# Patient Record
Sex: Female | Born: 1935 | Race: Black or African American | Hispanic: No | Smoking: Former smoker
Health system: Southern US, Community
[De-identification: ages and names within clinical notes are randomized; demographics above are authoritative.]

## PROBLEM LIST (undated history)

## (undated) DIAGNOSIS — L0390 Cellulitis, unspecified: Secondary | ICD-10-CM

## (undated) DIAGNOSIS — I2510 Atherosclerotic heart disease of native coronary artery without angina pectoris: Secondary | ICD-10-CM

## (undated) DIAGNOSIS — IMO0001 Reserved for inherently not codable concepts without codable children: Secondary | ICD-10-CM

## (undated) DIAGNOSIS — N2581 Secondary hyperparathyroidism of renal origin: Secondary | ICD-10-CM

## (undated) DIAGNOSIS — I4891 Unspecified atrial fibrillation: Secondary | ICD-10-CM

## (undated) DIAGNOSIS — N186 End stage renal disease: Secondary | ICD-10-CM

## (undated) DIAGNOSIS — M79609 Pain in unspecified limb: Secondary | ICD-10-CM

## (undated) DIAGNOSIS — I10 Essential (primary) hypertension: Secondary | ICD-10-CM

## (undated) DIAGNOSIS — I330 Acute and subacute infective endocarditis: Secondary | ICD-10-CM

## (undated) DIAGNOSIS — F0390 Unspecified dementia without behavioral disturbance: Secondary | ICD-10-CM

## (undated) DIAGNOSIS — M24529 Contracture, unspecified elbow: Secondary | ICD-10-CM

## (undated) DIAGNOSIS — S32409A Unspecified fracture of unspecified acetabulum, initial encounter for closed fracture: Secondary | ICD-10-CM

## (undated) DIAGNOSIS — J189 Pneumonia, unspecified organism: Secondary | ICD-10-CM

## (undated) DIAGNOSIS — F329 Major depressive disorder, single episode, unspecified: Secondary | ICD-10-CM

## (undated) DIAGNOSIS — R609 Edema, unspecified: Secondary | ICD-10-CM

## (undated) DIAGNOSIS — D649 Anemia, unspecified: Secondary | ICD-10-CM

## (undated) DIAGNOSIS — Z992 Dependence on renal dialysis: Secondary | ICD-10-CM

## (undated) DIAGNOSIS — L89309 Pressure ulcer of unspecified buttock, unspecified stage: Secondary | ICD-10-CM

## (undated) DIAGNOSIS — K5900 Constipation, unspecified: Secondary | ICD-10-CM

## (undated) DIAGNOSIS — K219 Gastro-esophageal reflux disease without esophagitis: Secondary | ICD-10-CM

## (undated) DIAGNOSIS — Z5189 Encounter for other specified aftercare: Secondary | ICD-10-CM

## (undated) DIAGNOSIS — Z7901 Long term (current) use of anticoagulants: Secondary | ICD-10-CM

## (undated) DIAGNOSIS — I6990 Unspecified sequelae of unspecified cerebrovascular disease: Secondary | ICD-10-CM

## (undated) DIAGNOSIS — E785 Hyperlipidemia, unspecified: Secondary | ICD-10-CM

## (undated) DIAGNOSIS — I639 Cerebral infarction, unspecified: Secondary | ICD-10-CM

## (undated) DIAGNOSIS — R7881 Bacteremia: Secondary | ICD-10-CM

## (undated) HISTORY — DX: Unspecified atrial fibrillation: I48.91

## (undated) HISTORY — PX: TUBAL LIGATION: SHX77

## (undated) HISTORY — PX: APPENDECTOMY: SHX54

## (undated) HISTORY — DX: Unspecified dementia, unspecified severity, without behavioral disturbance, psychotic disturbance, mood disturbance, and anxiety: F03.90

## (undated) HISTORY — DX: Secondary hyperparathyroidism of renal origin: N25.81

## (undated) HISTORY — DX: Pain in unspecified limb: M79.609

## (undated) HISTORY — DX: Unspecified sequelae of unspecified cerebrovascular disease: I69.90

## (undated) HISTORY — DX: Long term (current) use of anticoagulants: Z79.01

## (undated) HISTORY — DX: Edema, unspecified: R60.9

## (undated) HISTORY — DX: Anemia, unspecified: D64.9

## (undated) HISTORY — DX: Gastro-esophageal reflux disease without esophagitis: K21.9

## (undated) HISTORY — DX: Essential (primary) hypertension: I10

## (undated) HISTORY — DX: Acute and subacute infective endocarditis: I33.0

## (undated) HISTORY — DX: Cerebral infarction, unspecified: I63.9

## (undated) HISTORY — PX: ABDOMINAL HYSTERECTOMY: SHX81

## (undated) HISTORY — DX: Unspecified fracture of unspecified acetabulum, initial encounter for closed fracture: S32.409A

## (undated) HISTORY — PX: CATARACT EXTRACTION W/ INTRAOCULAR LENS  IMPLANT, BILATERAL: SHX1307

## (undated) HISTORY — PX: CHOLECYSTECTOMY: SHX55

## (undated) HISTORY — DX: Cellulitis, unspecified: L03.90

## (undated) HISTORY — DX: Bacteremia: R78.81

## (undated) HISTORY — DX: Constipation, unspecified: K59.00

## (undated) HISTORY — DX: Atherosclerotic heart disease of native coronary artery without angina pectoris: I25.10

## (undated) HISTORY — PX: ARTERIOVENOUS GRAFT PLACEMENT: SUR1029

---

## 2005-04-14 ENCOUNTER — Encounter: Payer: Self-pay | Admitting: Internal Medicine

## 2005-04-15 ENCOUNTER — Encounter: Payer: Self-pay | Admitting: Internal Medicine

## 2008-12-17 ENCOUNTER — Encounter: Payer: Self-pay | Admitting: Internal Medicine

## 2008-12-19 DIAGNOSIS — I10 Essential (primary) hypertension: Secondary | ICD-10-CM

## 2008-12-19 HISTORY — DX: Essential (primary) hypertension: I10

## 2008-12-20 ENCOUNTER — Encounter: Payer: Self-pay | Admitting: Internal Medicine

## 2009-01-13 ENCOUNTER — Encounter: Payer: Self-pay | Admitting: Internal Medicine

## 2009-01-14 DIAGNOSIS — I429 Cardiomyopathy, unspecified: Secondary | ICD-10-CM | POA: Insufficient documentation

## 2009-01-14 DIAGNOSIS — I10 Essential (primary) hypertension: Secondary | ICD-10-CM | POA: Insufficient documentation

## 2009-01-14 DIAGNOSIS — I4891 Unspecified atrial fibrillation: Secondary | ICD-10-CM | POA: Insufficient documentation

## 2009-01-17 ENCOUNTER — Ambulatory Visit: Payer: Self-pay | Admitting: Internal Medicine

## 2009-01-17 DIAGNOSIS — E119 Type 2 diabetes mellitus without complications: Secondary | ICD-10-CM

## 2009-01-17 DIAGNOSIS — E211 Secondary hyperparathyroidism, not elsewhere classified: Secondary | ICD-10-CM

## 2009-01-17 DIAGNOSIS — N186 End stage renal disease: Secondary | ICD-10-CM

## 2009-01-17 DIAGNOSIS — I6340 Cerebral infarction due to embolism of unspecified cerebral artery: Secondary | ICD-10-CM | POA: Insufficient documentation

## 2009-01-17 DIAGNOSIS — D638 Anemia in other chronic diseases classified elsewhere: Secondary | ICD-10-CM

## 2009-01-17 DIAGNOSIS — F068 Other specified mental disorders due to known physiological condition: Secondary | ICD-10-CM

## 2009-01-31 ENCOUNTER — Ambulatory Visit (HOSPITAL_COMMUNITY): Admission: RE | Admit: 2009-01-31 | Discharge: 2009-01-31 | Payer: Self-pay | Admitting: Internal Medicine

## 2009-01-31 ENCOUNTER — Ambulatory Visit: Payer: Self-pay

## 2009-01-31 ENCOUNTER — Encounter: Payer: Self-pay | Admitting: Internal Medicine

## 2009-01-31 ENCOUNTER — Ambulatory Visit: Payer: Self-pay | Admitting: Cardiovascular Disease

## 2009-02-07 DIAGNOSIS — Z7901 Long term (current) use of anticoagulants: Secondary | ICD-10-CM

## 2009-02-07 HISTORY — DX: Long term (current) use of anticoagulants: Z79.01

## 2009-02-28 ENCOUNTER — Ambulatory Visit (HOSPITAL_COMMUNITY): Admission: RE | Admit: 2009-02-28 | Discharge: 2009-02-28 | Payer: Self-pay | Admitting: Nephrology

## 2009-04-15 ENCOUNTER — Encounter: Payer: Self-pay | Admitting: Internal Medicine

## 2009-04-18 ENCOUNTER — Ambulatory Visit: Payer: Self-pay | Admitting: Internal Medicine

## 2009-04-27 DIAGNOSIS — I4891 Unspecified atrial fibrillation: Secondary | ICD-10-CM

## 2009-04-27 HISTORY — DX: Unspecified atrial fibrillation: I48.91

## 2009-05-02 ENCOUNTER — Ambulatory Visit (HOSPITAL_COMMUNITY): Admission: RE | Admit: 2009-05-02 | Discharge: 2009-05-02 | Payer: Self-pay | Admitting: Nephrology

## 2009-05-11 DIAGNOSIS — I6990 Unspecified sequelae of unspecified cerebrovascular disease: Secondary | ICD-10-CM

## 2009-05-11 HISTORY — DX: Unspecified sequelae of unspecified cerebrovascular disease: I69.90

## 2009-06-13 DIAGNOSIS — E785 Hyperlipidemia, unspecified: Secondary | ICD-10-CM

## 2009-06-13 HISTORY — DX: Hyperlipidemia, unspecified: E78.5

## 2009-07-02 ENCOUNTER — Ambulatory Visit: Payer: Self-pay | Admitting: Cardiology

## 2009-07-02 ENCOUNTER — Inpatient Hospital Stay (HOSPITAL_COMMUNITY): Admission: EM | Admit: 2009-07-02 | Discharge: 2009-07-18 | Payer: Self-pay | Admitting: Emergency Medicine

## 2009-07-11 ENCOUNTER — Encounter (INDEPENDENT_AMBULATORY_CARE_PROVIDER_SITE_OTHER): Payer: Self-pay | Admitting: Internal Medicine

## 2009-07-12 ENCOUNTER — Encounter (INDEPENDENT_AMBULATORY_CARE_PROVIDER_SITE_OTHER): Payer: Self-pay | Admitting: Internal Medicine

## 2009-07-14 ENCOUNTER — Ambulatory Visit: Payer: Self-pay | Admitting: Internal Medicine

## 2009-07-21 ENCOUNTER — Observation Stay (HOSPITAL_COMMUNITY): Admission: EM | Admit: 2009-07-21 | Discharge: 2009-07-23 | Payer: Self-pay | Admitting: Emergency Medicine

## 2009-08-15 DIAGNOSIS — F32A Depression, unspecified: Secondary | ICD-10-CM

## 2009-08-15 HISTORY — DX: Depression, unspecified: F32.A

## 2009-08-31 ENCOUNTER — Ambulatory Visit (HOSPITAL_COMMUNITY): Admission: RE | Admit: 2009-08-31 | Discharge: 2009-08-31 | Payer: Self-pay | Admitting: Nephrology

## 2009-09-07 ENCOUNTER — Ambulatory Visit: Payer: Self-pay | Admitting: Vascular Surgery

## 2009-09-07 ENCOUNTER — Emergency Department (HOSPITAL_COMMUNITY): Admission: EM | Admit: 2009-09-07 | Discharge: 2009-09-07 | Payer: Self-pay | Admitting: Emergency Medicine

## 2009-09-07 ENCOUNTER — Encounter (INDEPENDENT_AMBULATORY_CARE_PROVIDER_SITE_OTHER): Payer: Self-pay | Admitting: Emergency Medicine

## 2009-09-08 ENCOUNTER — Ambulatory Visit (HOSPITAL_COMMUNITY): Admission: RE | Admit: 2009-09-08 | Discharge: 2009-09-08 | Payer: Self-pay | Admitting: Nephrology

## 2009-10-03 ENCOUNTER — Inpatient Hospital Stay (HOSPITAL_COMMUNITY): Admission: EM | Admit: 2009-10-03 | Discharge: 2009-10-06 | Payer: Self-pay | Admitting: Emergency Medicine

## 2009-10-03 ENCOUNTER — Ambulatory Visit: Payer: Self-pay | Admitting: Cardiology

## 2009-10-05 ENCOUNTER — Encounter (INDEPENDENT_AMBULATORY_CARE_PROVIDER_SITE_OTHER): Payer: Self-pay | Admitting: Internal Medicine

## 2009-10-12 ENCOUNTER — Ambulatory Visit (HOSPITAL_COMMUNITY): Admission: RE | Admit: 2009-10-12 | Discharge: 2009-10-12 | Payer: Self-pay | Admitting: Internal Medicine

## 2009-10-24 ENCOUNTER — Ambulatory Visit: Payer: Self-pay | Admitting: Internal Medicine

## 2009-11-02 ENCOUNTER — Inpatient Hospital Stay (HOSPITAL_COMMUNITY): Admission: EM | Admit: 2009-11-02 | Discharge: 2009-11-07 | Payer: Self-pay | Admitting: Emergency Medicine

## 2009-11-02 ENCOUNTER — Ambulatory Visit: Payer: Self-pay | Admitting: Internal Medicine

## 2009-11-04 ENCOUNTER — Encounter: Payer: Self-pay | Admitting: Internal Medicine

## 2009-11-14 DIAGNOSIS — R7881 Bacteremia: Secondary | ICD-10-CM

## 2009-11-14 DIAGNOSIS — I330 Acute and subacute infective endocarditis: Secondary | ICD-10-CM

## 2009-11-14 HISTORY — DX: Acute and subacute infective endocarditis: I33.0

## 2009-11-14 HISTORY — DX: Bacteremia: R78.81

## 2009-11-23 ENCOUNTER — Encounter: Payer: Self-pay | Admitting: Internal Medicine

## 2009-11-23 ENCOUNTER — Ambulatory Visit (HOSPITAL_COMMUNITY): Admission: RE | Admit: 2009-11-23 | Discharge: 2009-11-23 | Payer: Self-pay | Admitting: Nephrology

## 2009-12-12 ENCOUNTER — Encounter: Payer: Self-pay | Admitting: Internal Medicine

## 2009-12-26 ENCOUNTER — Encounter: Payer: Self-pay | Admitting: Internal Medicine

## 2009-12-26 ENCOUNTER — Ambulatory Visit (HOSPITAL_COMMUNITY): Admission: RE | Admit: 2009-12-26 | Discharge: 2009-12-26 | Payer: Self-pay | Admitting: Nephrology

## 2009-12-27 ENCOUNTER — Encounter: Payer: Self-pay | Admitting: Internal Medicine

## 2009-12-28 ENCOUNTER — Ambulatory Visit: Payer: Self-pay | Admitting: Cardiology

## 2009-12-28 ENCOUNTER — Encounter (INDEPENDENT_AMBULATORY_CARE_PROVIDER_SITE_OTHER): Payer: Self-pay | Admitting: Nephrology

## 2009-12-28 ENCOUNTER — Encounter: Payer: Self-pay | Admitting: Internal Medicine

## 2009-12-28 ENCOUNTER — Ambulatory Visit (HOSPITAL_COMMUNITY): Admission: RE | Admit: 2009-12-28 | Discharge: 2009-12-28 | Payer: Self-pay | Admitting: Nephrology

## 2009-12-29 ENCOUNTER — Encounter: Payer: Self-pay | Admitting: Internal Medicine

## 2010-01-03 ENCOUNTER — Encounter: Payer: Self-pay | Admitting: Internal Medicine

## 2010-01-03 DIAGNOSIS — R7881 Bacteremia: Secondary | ICD-10-CM

## 2010-01-04 ENCOUNTER — Ambulatory Visit: Payer: Self-pay | Admitting: Vascular Surgery

## 2010-01-04 ENCOUNTER — Encounter (INDEPENDENT_AMBULATORY_CARE_PROVIDER_SITE_OTHER): Payer: Self-pay | Admitting: Nephrology

## 2010-01-04 ENCOUNTER — Ambulatory Visit (HOSPITAL_COMMUNITY): Admission: RE | Admit: 2010-01-04 | Discharge: 2010-01-04 | Payer: Self-pay | Admitting: Nephrology

## 2010-01-09 ENCOUNTER — Ambulatory Visit: Payer: Self-pay | Admitting: Surgery

## 2010-01-09 ENCOUNTER — Encounter: Payer: Self-pay | Admitting: Cardiology

## 2010-01-11 ENCOUNTER — Ambulatory Visit: Payer: Self-pay | Admitting: Cardiology

## 2010-01-11 ENCOUNTER — Ambulatory Visit (HOSPITAL_COMMUNITY): Admission: RE | Admit: 2010-01-11 | Discharge: 2010-01-11 | Payer: Self-pay | Admitting: Cardiology

## 2010-01-11 ENCOUNTER — Encounter: Payer: Self-pay | Admitting: Cardiology

## 2010-01-13 ENCOUNTER — Encounter (INDEPENDENT_AMBULATORY_CARE_PROVIDER_SITE_OTHER): Payer: Self-pay | Admitting: *Deleted

## 2010-01-16 ENCOUNTER — Emergency Department (HOSPITAL_COMMUNITY): Admission: EM | Admit: 2010-01-16 | Discharge: 2010-01-16 | Payer: Self-pay | Admitting: Emergency Medicine

## 2010-01-17 ENCOUNTER — Inpatient Hospital Stay (HOSPITAL_COMMUNITY)
Admission: EM | Admit: 2010-01-17 | Discharge: 2010-01-24 | Payer: Self-pay | Source: Home / Self Care | Admitting: Emergency Medicine

## 2010-01-17 DIAGNOSIS — A4902 Methicillin resistant Staphylococcus aureus infection, unspecified site: Secondary | ICD-10-CM | POA: Insufficient documentation

## 2010-01-18 ENCOUNTER — Encounter (INDEPENDENT_AMBULATORY_CARE_PROVIDER_SITE_OTHER): Payer: Self-pay | Admitting: Emergency Medicine

## 2010-01-22 ENCOUNTER — Encounter: Payer: Self-pay | Admitting: Surgery

## 2010-01-23 ENCOUNTER — Ambulatory Visit: Payer: Self-pay | Admitting: Vascular Surgery

## 2010-01-26 DIAGNOSIS — K219 Gastro-esophageal reflux disease without esophagitis: Secondary | ICD-10-CM

## 2010-01-26 HISTORY — DX: Gastro-esophageal reflux disease without esophagitis: K21.9

## 2010-02-06 ENCOUNTER — Ambulatory Visit: Payer: Self-pay | Admitting: Surgery

## 2010-02-15 ENCOUNTER — Ambulatory Visit: Payer: Self-pay | Admitting: Vascular Surgery

## 2010-03-22 ENCOUNTER — Ambulatory Visit
Admission: RE | Admit: 2010-03-22 | Discharge: 2010-03-22 | Payer: Self-pay | Source: Home / Self Care | Attending: Infectious Diseases | Admitting: Infectious Diseases

## 2010-03-22 ENCOUNTER — Encounter: Payer: Self-pay | Admitting: Infectious Diseases

## 2010-03-31 ENCOUNTER — Encounter: Payer: Self-pay | Admitting: Infectious Diseases

## 2010-04-03 ENCOUNTER — Ambulatory Visit: Admission: RE | Admit: 2010-04-03 | Discharge: 2010-04-03 | Payer: Self-pay | Source: Home / Self Care

## 2010-04-04 NOTE — Assessment & Plan Note (Addendum)
OFFICE VISIT  [name], [name] DOB:  1929-11-26                                       04/03/2010 IRJJO#:[id number]  The patient comes back in today for access discussion.  She has recently undergone removal of a left upper arm graft secondary to infection and has been dialyzing through a right-sided catheter.  She has completed several weeks worth of antibiotics.  Her wounds are healed.  She has not had any active signs of infection.  Her next option is a right forearm graft.  This will be placed on Friday February 3.  She is on Coumadin for atrial fibrillation and I am holding her Coumadin 5 days prior.  She has palpable radial and brachial pulse.    Jorge Ny, MD Electronically Signed  VWB/MEDQ  D:  04/03/2010  T:  04/04/2010  Job:  [id number]  cc:   Dr Darrick Penna

## 2010-04-11 NOTE — Assessment & Plan Note (Signed)
Summary: 6 mo f/u   Visit Type:  Follow-up Referring Provider:  Camille Bal, MD Primary Provider:  Jena Gauss MD   History of Present Illness: The patient presents today for routine cardiology followup. She reports doing very well since last being seen in our clinic, though she was recently hospitalized 5/11 for dehydration/orthostatic syncope and 7/11 for MRSA bacteremia.  She has significant dementia and her history is therefore limited. The patient denies symptoms of palpitations, chest pain, shortness of breath, orthopnea, PND, lower extremity edema, dizziness, presyncope, syncope, or neurologic sequela. The patient is tolerating medications without difficulties and is otherwise without complaint today.   Current Medications (verified): 1)  Warfarin Sodium 6 Mg Tabs (Warfarin Sodium) .... Use As Directed By Anticoagulation Clinic 2)  Lantus 100 Unit/ml Soln (Insulin Glargine) .... As Direected 3)  Metoprolol Tartrate 25 Mg Tabs (Metoprolol Tartrate) .... 1/2 Tab Two Times A Day 4)  Percocet 5-325 Mg Tabs (Oxycodone-Acetaminophen) .... Uad 5)  Eucerin  Lotn (Emollient) .... Uad 6)  Cymbalta 20 Mg Cpep (Duloxetine Hcl) .... Once Daily 7)  Trazodone Hcl 50 Mg Tabs (Trazodone Hcl) .... Uad 8)  Simvastatin 20 Mg Tabs (Simvastatin) .... Take One Tablet By Mouth Daily At Bedtime 9)  Vancomycin Hcl 750 Mg Solr (Vancomycin Hcl) .... Uad 10)  Duoderm Cgf Dressing  Misc (Control Gel Formula Dressing) .... Uad 11)  Colace 100 Mg Caps (Docusate Sodium) .... Uad 12)  Venofer 20 Mg/ml Soln (Iron Sucrose) .... Uad 13)  Zemplar 2 Mcg/ml Soln (Paricalcitol) .... Uad 14)  Zofran 4 Mg Tabs (Ondansetron Hcl) .... As Needed  Allergies (verified): 1)  ! Jonne Ply  Past History:  Past Medical History: Reviewed history from 01/17/2009 and no changes required. 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 Tubal ligation Appendectomy Cholecystectomy TAH and BSO Cataract extraction Dialysis access placement  Social History: Reviewed history from 01/17/2009 and no changes required. Divorced .  Recently relocated to [place] from [place] and now lives in [place].  On dialysis x 2 years. Tobacco Use - No.  Alcohol Use - no  Review of Systems       All systems are reviewed and negative except as listed in the HPI.   Vital Signs:  Patient profile:   77 year old female Height:      66 inches Weight:      151 pounds BMI:     24.46 Pulse rate:   59 / minute BP sitting:   168 / 70  Vitals Entered By: Laurance Flatten CMA (October 24, 2009 3:04 PM)  Physical Exam  General:  elderly female, NAD Head:  normocephalic and atraumatic Eyes:  PERRLA/EOM intact; conjunctiva and lids normal. Mouth:  Teeth, gums and palate normal. Oral mucosa normal. Neck:  supple, no bruits or JVD Lungs:  Clear bilaterally to auscultation and percussion. Heart:  RRR, no m/r/g Abdomen:  Bowel sounds positive; abdomen soft and non-tender without masses, organomegaly, or hernias noted. No hepatosplenomegaly. Msk:  requires wheelchair to ambulate.  Residual L arm/leg weakness Pulses:  pulses normal in all 4 extremities Extremities:  no c/c/e Neurologic:  residual R arm/leg weakness Skin:  Intact without lesions or rashes.  Cervical Nodes:  no significant adenopathy Psych:  Normal affect.   EKG  Procedure date:  10/24/2009  Findings:      sinus bradycardia 59 bpm, PR 214, Qtc 447, LVH, otherwise normal ekg  Impression & Recommendations:  Problem # 1:  ATRIAL FIBRILLATION  (ICD-427.31) stable. presently in sinus continue coumadin, goal INR 2-3 importance of close INR checks through [facility] stressed with patient today  Problem # 2:  HYPERTENSION, UNSPECIFIED (ICD-401.9)  above goal today will defer to nephrology as they know BP range during HD  Her updated medication list for this problem includes:    Metoprolol Tartrate 25 Mg Tabs (Metoprolol tartrate) .Marland Kitchen... 1/2 tab two times a day  Problem # 3:  DEMENTIA, MILD (ICD-294.8) stable  Problem # 4:  CARDIOMYOPATHY, SECONDARY (ICD-425.9) no symptoms of CHF volume managed with HD  Patient Instructions: 1)  Your physician recommends that you schedule a follow-up appointment in: 8 months with Dr Johney Frame

## 2010-04-11 NOTE — Progress Notes (Signed)
Summary: Med List/Problem List   Med List/Problem List   Imported By: Roderic Ovens 01/26/2010 09:19:36  _____________________________________________________________________  External Attachment:    Type:   Image     Comment:   External Document

## 2010-04-11 NOTE — Miscellaneous (Signed)
Summary: Problems, Medications and Allergies updated  Clinical Lists Changes  Problems: Added new problem of BACTEREMIA (ICD-790.7) - persistent MRSA Medications: Added new medication of CUBICIN 500 MG SOLR (DAPTOMYCIN) Administer 250 mg via PICC three times a day Removed medication of VANCOMYCIN HCL 750 MG SOLR (VANCOMYCIN HCL) UAD Added new medication of NEPRO  LIQD (NUTRITIONAL SUPPLEMENTS) One can once daily with a meal Added new medication of NEPHRO-VITE RX 1 MG TABS (B COMPLEX-C-FOLIC ACID) Take 1 tablet by mouth once a day Allergies: Added new allergy or adverse reaction of HEPARIN Added new allergy or adverse reaction of MINOXIDIL (MINOXIDIL) Observations: Added new observation of ALLERGY REV: Done (01/13/2010 11:42)

## 2010-04-11 NOTE — Progress Notes (Signed)
Summary: [facility] Physicians Order Sheet   [facility] Physicians Order Sheet   Imported By: Roderic Ovens 01/26/2010 09:22:10  _____________________________________________________________________  External Attachment:    Type:   Image     Comment:   External Document

## 2010-04-11 NOTE — Progress Notes (Signed)
Summary: [facility] Multidisciplinary Progress Notes  [facility] Multidisciplinary Progress Notes   Imported By: Roderic Ovens 01/26/2010 09:20:16  _____________________________________________________________________  External Attachment:    Type:   Image     Comment:   External Document

## 2010-04-11 NOTE — Miscellaneous (Signed)
  Clinical Lists Changes  Observations: Added new observation of ECHOINTERP:  - Left ventricle: The cavity size was mildly dilated. Wall thickness       was increased in a pattern of mild LVH. Systolic function was       mildly reduced. The estimated ejection fraction was in the range       of 45% to 50%. Diffuse hypokinesis.     - Atrial septum: No defect or patent foramen ovale was identified.     Impressions:            - No cardiac source of emboli was indentified. (01/31/2009 15:26)      Echocardiogram  Procedure date:  01/31/2009  Findings:       - Left ventricle: The cavity size was mildly dilated. Wall thickness       was increased in a pattern of mild LVH. Systolic function was       mildly reduced. The estimated ejection fraction was in the range       of 45% to 50%. Diffuse hypokinesis.     - Atrial septum: No defect or patent foramen ovale was identified.     Impressions:            - No cardiac source of emboli was indentified.

## 2010-04-11 NOTE — Letter (Signed)
Summary: Consultation/Clinic Referral  Consultation/Clinic Referral   Imported By: Marylou Mccoy 12/22/2009 15:13:07  _____________________________________________________________________  External Attachment:    Type:   Image     Comment:   External Document

## 2010-04-11 NOTE — Letter (Signed)
Summary: [facility] Ekg 2007  [facility] Ekg 2007   Imported By: Roderic Ovens 01/26/2010 14:78:29  _____________________________________________________________________  External Attachment:    Type:   Image     Comment:   External Document

## 2010-04-11 NOTE — Assessment & Plan Note (Signed)
Summary: per check out/sf   Visit Type:  Follow-up Referring Provider:  Camille Bal, MD Primary Provider:  Jena Gauss MD   History of Present Illness: The patient presents today for routine cardiology followup. He reports doing very well since last being seen in our clinic. The patient denies symptoms of palpitations, chest pain, shortness of breath, orthopnea, PND, lower extremity edema, dizziness, presyncope, syncope, or neurologic sequela. The patient is tolerating medications without difficulties and is otherwise without complaint today.   Current Medications (verified): 1)  Warfarin Sodium 6 Mg Tabs (Warfarin Sodium) .... Use As Directed By Anticoagulation Clinic 2)  Novolin R 100 Unit/ml Soln (Insulin Regular Human) .... As Directed 3)  Lantus 100 Unit/ml Soln (Insulin Glargine) .... As Direected 4)  Metoprolol Tartrate 25 Mg Tabs (Metoprolol Tartrate) .Marland Kitchen.. 1 Tab Two Times A Day M,w F,su 1 Tab Per Week Tu,th, Sa 5)  Phoslo 667 Mg Caps (Calcium Acetate (Phos Binder)) .... Three Times A Day 6)  Trazodone Hcl 50 Mg Tabs (Trazodone Hcl) .... At Bedtime 7)  Guaifenesin-Dm 100-10 Mg/36ml Syrp (Dextromethorphan-Guaifenesin) .... Uad 8)  Men-Phor 0.5-0.5 % Lotn (Camphor-Menthol) .... Uad  Allergies (verified): 1)  ! Jonne Ply  Past History:  Past Medical History: Reviewed history from 01/17/2009 and no changes required. 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 Tubal ligation Appendectomy Cholecystectomy TAH and BSO Cataract extraction Dialysis access placement  Social  History: Reviewed history from 01/17/2009 and no changes required. Divorced .  Recently relocated to [place] from [place]  and now lives in [place].  On dialysis x 2 years. Tobacco Use - No.  Alcohol Use - no  Review of Systems       All systems are reviewed and negative except as listed in the HPI.   Vital Signs:  Patient profile:   77 year old female Height:      66 inches Weight:      138 pounds BMI:     22.35 Pulse rate:   64 / minute BP sitting:   140 / 80  (left arm)  Vitals Entered By: Laurance Flatten CMA (April 18, 2009 11:36 AM)  Physical Exam  General:  elderly female, NAD Head:  normocephalic and atraumatic Eyes:  PERRLA/EOM intact; conjunctiva and lids normal. Nose:  no deformity, discharge, inflammation, or lesions Mouth:  Teeth, gums and palate normal. Oral mucosa normal. Neck:  supple, no bruits or JVD Lungs:  Clear bilaterally to auscultation and percussion. Heart:  RRR, no m/r/g Abdomen:  Bowel sounds positive; abdomen soft and non-tender without masses, organomegaly, or hernias noted. No hepatosplenomegaly. Msk:  requires wheelchair to ambulate.  Residual L arm/leg weakness Pulses:  pulses normal in all 4 extremities Extremities:  no c/c/e Neurologic:  residual R arm/leg weakness   Impression & Recommendations:  Problem # 1:  ATRIAL FIBRILLATION (ICD-427.31) stable. presently in sinus continue coumadin, goal INR 2-3 importance of close INR checks through [facility] stressed with patient today  Her updated medication list for this problem includes:    Warfarin Sodium 6 Mg Tabs (Warfarin sodium) ..... Use as directed by anticoagulation clinic    Metoprolol Tartrate 25 Mg Tabs (Metoprolol tartrate) .Marland Kitchen... 1 tab  two times a day m,w f,su 1 tab per week tu,th, sa  Problem # 2:  HYPERTENSION, UNSPECIFIED (ICD-401.9)  stable no changes  Her updated medication list for this problem includes:    Metoprolol Tartrate 25 Mg Tabs (Metoprolol  tartrate) .Marland Kitchen... 1 tab two times a day m,w f,su 1 tab per week tu,th, sa  Her updated medication list for this problem includes:    Metoprolol Tartrate 25 Mg Tabs (Metoprolol tartrate) .Marland Kitchen... 1 tab two times a day m,w f,su 1 tab per week tu,th, sa  Patient Instructions: 1)  Your physician recommends that you schedule a follow-up appointment in: 6 months with Dr Johney Frame

## 2010-04-11 NOTE — Letter (Signed)
Summary: In-Center Hemodialysis Progress Note   In-Center Hemodialysis Progress Note   Imported By: Roderic Ovens 01/26/2010 09:18:51  _____________________________________________________________________  External Attachment:    Type:   Image     Comment:   External Document

## 2010-04-11 NOTE — Consult Note (Signed)
Summary: [facility] Referral Form   [facility] Referral Form   Imported By: Roderic Ovens 01/26/2010 09:24:59  _____________________________________________________________________  External Attachment:    Type:   Image     Comment:   External Document

## 2010-04-13 NOTE — Assessment & Plan Note (Signed)
Summary: new pt mrsa   Referring Provider:  Camille Bal, MD Primary Provider:  Jena Gauss MD  CC:  MRSA "infection at dialysis site".  History of Present Illness:  77 yo F with a history  of end-stage renal disease on hemodialysis, chronic afib on   anticoagulation, coronary artery disease, CVA with L hemiparesis, type 2 diabetes mellitus,   hypertension, and history of MRSA Bacteremia 7-24 and 11-02-09. She was adm to [facility] 11-8 to 11-15-11for left arm pain.  She was found to have Methicillin-resistant Staphylococcus aureus bacteremia due to AV graft of the left upper extremity being abscessed. It was removed by Dr. Myra Gianotti.   A transesophageal echocardiogram was done 01-11-10 (negative for vegetation). her repeat BCx 01-20-10 were negative. The patient was d/c to SNF and has been getting vancomycin with hemodialysis.   She states that her HD acess is now in her neck and that her LUE is no longer being used. She states that she got antibiotics at her last HD (03-21-10). Has occas f/c, can't remember when it was. "A week ago now".  Felt poorly after HD yesterday. Can't see her arm and tell whether it is healing well.   Preventive Screening-Counseling & Management  Alcohol-Tobacco     Alcohol drinks/day: 0     Smoking Status: never  Caffeine-Diet-Exercise     Caffeine use/day: yes     Does Patient Exercise: PT     Times/week: 3   Updated Prior Medication List: WARFARIN SODIUM 6 MG TABS (WARFARIN SODIUM) Use as directed by Anticoagulation Clinic LANTUS 100 UNIT/ML SOLN (INSULIN GLARGINE) as direected METOPROLOL TARTRATE 25 MG TABS (METOPROLOL TARTRATE) 1/2 tab two times a day PERCOCET 5-325 MG TABS (OXYCODONE-ACETAMINOPHEN) UAD EUCERIN  LOTN (EMOLLIENT) UAD CYMBALTA 20 MG CPEP (DULOXETINE HCL) once daily TRAZODONE HCL 50 MG TABS (TRAZODONE HCL) UAd SIMVASTATIN 20 MG TABS (SIMVASTATIN) Take one tablet by mouth daily at bedtime DUODERM CGF DRESSING  MISC (CONTROL GEL FORMULA  DRESSING) UAD COLACE 100 MG CAPS (DOCUSATE SODIUM) uad VENOFER 20 MG/ML SOLN (IRON SUCROSE) uad ZEMPLAR 2 MCG/ML SOLN (PARICALCITOL) uad ZOFRAN 4 MG TABS (ONDANSETRON HCL) as needed NEPRO  LIQD (NUTRITIONAL SUPPLEMENTS) One can once daily with a meal NEPHRO-VITE RX 1 MG TABS (B COMPLEX-C-FOLIC ACID) Take 1 tablet by mouth once a day NORVASC 5 MG TABS (AMLODIPINE BESYLATE) Take 1 tablet by mouth once a day OMEPRAZOLE 20 MG CPDR (OMEPRAZOLE) Take 1 tablet by mouth once a day  Current Allergies (reviewed today): ! ASA ! HEPARIN ! MINOXIDIL (MINOXIDIL) Additional History Menstrual Status:  postmenopausal  Review of Systems       occsaionally makes urine. nl BM. L paraplegia post CVA.   Vital Signs:  Patient profile:   77 year old female Menstrual status:  postmenopausal Height:      66 inches (167.64 cm) Weight:      137.75 pounds (62.61 kg) BMI:     22.31 Temp:     97.4 degrees F (36.33 degrees C) oral Pulse rate:   58 / minute BP sitting:   159 / 76  (left arm) Cuff size:   regular  Vitals Entered By: Jennet Maduro RN (March 22, 2010 2:03 PM) CC: MRSA "infection at dialysis site" Is Patient Diabetic? Yes Did you bring your meter with you today? checked this morning Pain Assessment Patient in pain? no      Nutritional Status BMI of 19 -24 = normal Nutritional Status Detail appetite "good"  Have you ever been in a  relationship where you felt threatened, hurt or afraid?nota sked today   Does patient need assistance? Functional Status Cook/clean, Shopping, Social activities Ambulation Wheelchair     Menstrual Status postmenopausal   Physical Exam  General:  well-developed, well-nourished, and well-hydrated.   Eyes:  pupils equal, pupils round, and pupils reactive to light.   Mouth:  pharynx pink and moist, no exudates, and poor dentition.   Neck:  no masses.   Chest Wall:  r chest- HD line, dressed, non-tender.  Lungs:  normal respiratory effort and normal  breath sounds.   Heart:  normal rate, regular rhythm, and no murmur.   Abdomen:  soft, non-tender, and normal bowel sounds.   Extremities:  Her LUE wound is well healed, non-tender, no fluctuance. THere is a small area ( ~0.5 cm) at the proximal end of the wound that is raw. No d/c.    Impression & Recommendations:  Problem # 1:  METHICILLIN RESISTANT STAPHYLOCOCCUS AUREUS INFECTION (ICD-041.12)  She appears to be doing well, would rec d/c her antibiotics ( she has now recieved 64 days of therapy). Would rec repeat BCx 1-2 weeks after she has been off her antibiotics. She had a negative TEE so do not feel further w/u for endocariditis is indicated. I will see her back as needed  Orders: Consultation Level IV ([id number])  Medications Added to Medication List This Visit: 1)  Norvasc 5 Mg Tabs (Amlodipine besylate) .... Take 1 tablet by mouth once a day 2)  Omeprazole 20 Mg Cpdr (Omeprazole) .... Take 1 tablet by mouth once a day

## 2010-04-13 NOTE — Miscellaneous (Signed)
Summary: HIPAA Restrictions  HIPAA Restrictions   Imported By: Florinda Marker 03/23/2010 10:11:11  _____________________________________________________________________  External Attachment:    Type:   Image     Comment:   External Document

## 2010-04-14 ENCOUNTER — Ambulatory Visit (HOSPITAL_COMMUNITY)
Admission: RE | Admit: 2010-04-14 | Discharge: 2010-04-14 | Disposition: A | Discharge status: Home / Self Care | Payer: Medicare Other | Attending: Surgery | Admitting: Surgery

## 2010-04-14 DIAGNOSIS — Z538 Procedure and treatment not carried out for other reasons: Secondary | ICD-10-CM | POA: Insufficient documentation

## 2010-04-14 DIAGNOSIS — N186 End stage renal disease: Secondary | ICD-10-CM | POA: Insufficient documentation

## 2010-04-14 LAB — POCT I-STAT 4, (NA,K, GLUC, HGB,HCT)
Hemoglobin: 15.6 g/dL — ABNORMAL HIGH (ref 12.0–15.0)
Sodium: 140 mEq/L (ref 135–145)

## 2010-04-14 LAB — PROTIME-INR: Prothrombin Time: 13.5 seconds (ref 11.6–15.2)

## 2010-04-14 LAB — GLUCOSE, CAPILLARY: Glucose-Capillary: 169 mg/dL — ABNORMAL HIGH (ref 70–99)

## 2010-05-05 ENCOUNTER — Ambulatory Visit: Payer: Medicare Other

## 2010-05-06 ENCOUNTER — Inpatient Hospital Stay (HOSPITAL_COMMUNITY)
Admission: EM | Admit: 2010-05-06 | Discharge: 2010-05-13 | DRG: 853 | Disposition: A | Discharge status: Skilled Nursing Facility | Payer: Medicare Other | Attending: Internal Medicine | Admitting: Internal Medicine

## 2010-05-06 ENCOUNTER — Emergency Department (HOSPITAL_COMMUNITY): Payer: Medicare Other

## 2010-05-06 DIAGNOSIS — I120 Hypertensive chronic kidney disease with stage 5 chronic kidney disease or end stage renal disease: Secondary | ICD-10-CM | POA: Diagnosis present

## 2010-05-06 DIAGNOSIS — N186 End stage renal disease: Secondary | ICD-10-CM | POA: Diagnosis present

## 2010-05-06 DIAGNOSIS — I2510 Atherosclerotic heart disease of native coronary artery without angina pectoris: Secondary | ICD-10-CM | POA: Diagnosis present

## 2010-05-06 DIAGNOSIS — I4891 Unspecified atrial fibrillation: Secondary | ICD-10-CM | POA: Diagnosis present

## 2010-05-06 DIAGNOSIS — Z992 Dependence on renal dialysis: Secondary | ICD-10-CM

## 2010-05-06 DIAGNOSIS — Y832 Surgical operation with anastomosis, bypass or graft as the cause of abnormal reaction of the patient, or of later complication, without mention of misadventure at the time of the procedure: Secondary | ICD-10-CM | POA: Diagnosis present

## 2010-05-06 DIAGNOSIS — D631 Anemia in chronic kidney disease: Secondary | ICD-10-CM | POA: Diagnosis present

## 2010-05-06 DIAGNOSIS — G4700 Insomnia, unspecified: Secondary | ICD-10-CM | POA: Diagnosis present

## 2010-05-06 DIAGNOSIS — A088 Other specified intestinal infections: Secondary | ICD-10-CM | POA: Diagnosis present

## 2010-05-06 DIAGNOSIS — D696 Thrombocytopenia, unspecified: Secondary | ICD-10-CM | POA: Diagnosis present

## 2010-05-06 DIAGNOSIS — I959 Hypotension, unspecified: Secondary | ICD-10-CM | POA: Diagnosis present

## 2010-05-06 DIAGNOSIS — R64 Cachexia: Secondary | ICD-10-CM | POA: Diagnosis present

## 2010-05-06 DIAGNOSIS — Z79899 Other long term (current) drug therapy: Secondary | ICD-10-CM

## 2010-05-06 DIAGNOSIS — R5381 Other malaise: Secondary | ICD-10-CM | POA: Diagnosis not present

## 2010-05-06 DIAGNOSIS — N2581 Secondary hyperparathyroidism of renal origin: Secondary | ICD-10-CM | POA: Diagnosis present

## 2010-05-06 DIAGNOSIS — K219 Gastro-esophageal reflux disease without esophagitis: Secondary | ICD-10-CM | POA: Diagnosis present

## 2010-05-06 DIAGNOSIS — Y92009 Unspecified place in unspecified non-institutional (private) residence as the place of occurrence of the external cause: Secondary | ICD-10-CM

## 2010-05-06 DIAGNOSIS — Z794 Long term (current) use of insulin: Secondary | ICD-10-CM

## 2010-05-06 DIAGNOSIS — E46 Unspecified protein-calorie malnutrition: Secondary | ICD-10-CM | POA: Diagnosis present

## 2010-05-06 DIAGNOSIS — E785 Hyperlipidemia, unspecified: Secondary | ICD-10-CM | POA: Diagnosis present

## 2010-05-06 DIAGNOSIS — Z7901 Long term (current) use of anticoagulants: Secondary | ICD-10-CM

## 2010-05-06 DIAGNOSIS — E119 Type 2 diabetes mellitus without complications: Secondary | ICD-10-CM | POA: Diagnosis present

## 2010-05-06 DIAGNOSIS — Z8673 Personal history of transient ischemic attack (TIA), and cerebral infarction without residual deficits: Secondary | ICD-10-CM

## 2010-05-06 DIAGNOSIS — F0390 Unspecified dementia without behavioral disturbance: Secondary | ICD-10-CM | POA: Diagnosis present

## 2010-05-06 DIAGNOSIS — Z87891 Personal history of nicotine dependence: Secondary | ICD-10-CM

## 2010-05-06 DIAGNOSIS — A4102 Sepsis due to Methicillin resistant Staphylococcus aureus: Principal | ICD-10-CM | POA: Diagnosis present

## 2010-05-06 DIAGNOSIS — A419 Sepsis, unspecified organism: Secondary | ICD-10-CM | POA: Diagnosis present

## 2010-05-06 DIAGNOSIS — T827XXA Infection and inflammatory reaction due to other cardiac and vascular devices, implants and grafts, initial encounter: Secondary | ICD-10-CM | POA: Diagnosis present

## 2010-05-06 LAB — CBC
HCT: 42.1 % (ref 36.0–46.0)
MCH: 27.2 pg (ref 26.0–34.0)
MCV: 85.4 fL (ref 78.0–100.0)
RBC: 4.93 MIL/uL (ref 3.87–5.11)
RDW: 18.3 % — ABNORMAL HIGH (ref 11.5–15.5)
WBC: 2.6 10*3/uL — ABNORMAL LOW (ref 4.0–10.5)

## 2010-05-06 LAB — POCT I-STAT, CHEM 8
BUN: 43 mg/dL — ABNORMAL HIGH (ref 6–23)
Calcium, Ion: 1.06 mmol/L — ABNORMAL LOW (ref 1.12–1.32)
Chloride: 102 mEq/L (ref 96–112)
Creatinine, Ser: 6.8 mg/dL — ABNORMAL HIGH (ref 0.4–1.2)
Glucose, Bld: 113 mg/dL — ABNORMAL HIGH (ref 70–99)
HCT: 45 % (ref 36.0–46.0)
Hemoglobin: 15.3 g/dL — ABNORMAL HIGH (ref 12.0–15.0)
Potassium: 5.1 mEq/L (ref 3.5–5.1)
Sodium: 132 mEq/L — ABNORMAL LOW (ref 135–145)
TCO2: 24 mmol/L (ref 0–100)

## 2010-05-06 LAB — LACTIC ACID, PLASMA: Lactic Acid, Venous: 1.8 mmol/L (ref 0.5–2.2)

## 2010-05-06 LAB — GLUCOSE, CAPILLARY
Glucose-Capillary: 131 mg/dL — ABNORMAL HIGH (ref 70–99)
Glucose-Capillary: 125 mg/dL — ABNORMAL HIGH (ref 70–99)

## 2010-05-06 LAB — TROPONIN I: Troponin I: 0.04 ng/mL (ref 0.00–0.06)

## 2010-05-06 LAB — PROTIME-INR
INR: 4.2 — ABNORMAL HIGH (ref 0.00–1.49)
Prothrombin Time: 40.4 seconds — ABNORMAL HIGH (ref 11.6–15.2)

## 2010-05-06 LAB — DIFFERENTIAL
Eosinophils Relative: 1 % (ref 0–5)
Lymphocytes Relative: 27 % (ref 12–46)
Lymphs Abs: 0.7 10*3/uL (ref 0.7–4.0)
Monocytes Relative: 11 % (ref 3–12)

## 2010-05-06 LAB — CK TOTAL AND CKMB (NOT AT ARMC)
CK, MB: 2.1 ng/mL (ref 0.3–4.0)
Relative Index: 0.8 (ref 0.0–2.5)
Total CK: 270 U/L — ABNORMAL HIGH (ref 7–177)

## 2010-05-06 NOTE — H&P (Signed)
Ebony [name], FAJARDO NO.:  [id number]  MEDICAL RECORD NO.:  [id number]           PATIENT TYPE:  E  LOCATION:  [facility]                         FACILITY:  [facility]  PHYSICIAN:  Andreas Blower, MD       DATE OF BIRTH:  07-14-1929  DATE OF ADMISSION:  05/06/2010 DATE OF DISCHARGE:                             HISTORY & PHYSICAL   PRIMARY CARE PHYSICIAN:  Doran Durand, MD  PRIMARY NEPHROLOGIST: Dr. Darrick Penna.  CHIEF COMPLAINT:  Nausea, vomiting, and diarrhea.  HISTORY OF PRESENT ILLNESS:  Ebony [name] is a 77 year old African American female with history of MRSA, bacteremia, left upper extremity abscesses secondary to AV graft infection which had been growing MRSA end-stage renal disease, chronic A. Fib on chronic anticoagulation, history of CVA with left-sided deficits, type 2 diabetes, hypertension, anemia, history of heparin-induced thrombocytopenia who presents with the above complaints.  The patient has dementia but was able to provide most of the history.  She reports that over the past week she has been having nausea, vomiting, diarrhea on and off.  She reports that she has vomited at least once or twice a day and has been having 1 or 2 watery bowel movements daily.  She also reports that at the nursing facility other residents have had similar problems with nausea, vomiting, diarrhea.  The patient was found to be hypotensive initially was reported that she had fever, however there was no documentation of fever.  As a result she was brought to the ER initially.  In the ER she was found to be hypotensive with blood pressure of 80s/40s which has improved with fluid bolus.  As a result, Hospital Service was asked to admit the patient for further management.  The patient believes that she may have had a fever, but is unsure. Denies any nausea now is feeling hungry at this time.  Denies any vomiting today.  Denies any chest pain, shortness of breath.  Denies  any abdominal pain.  Denies any blood in stool.  Denies vomiting up blood. Denies any headaches or vision changes.  REVIEW OF SYSTEMS:  All systems were reviewed with the patient was positive as per HPI, otherwise all systems are negative.  PAST MEDICAL HISTORY: 1. History of MRSA bacteremia. 2. History of left upper extremity abscess secondary to AV graft     infection growing MRSA. 3. End-stage renal disease on hemodialysis Tuesday, Thursday,     Saturday. 4. History of dementia. 5. History of chronic A fib on chronic anticoagulation. 6. History of CVA with left-sided deficits and contracture. 7. Type 2 diabetes. 8. Hypertension. 9. Anemia of chronic kidney disease. 10.History of heparin-induced thrombocytopenia. 11.History of pneumonia. 12.History of coronary artery disease.  SOCIAL HISTORY:  The patient quit smoking about 30 years ago.  The patient currently lives at [facility] and does not drink any alcohol.  FAMILY HISTORY:  Reports that her mother and her brother had diabetes.  PHYSICAL EXAM:  VITAL SIGNS:  Temperature is 97.9, blood pressure is 92/61 as I was leaving the room was 105/60, pulse of 57, respirations 20, satting at 100% on room air. GENERAL:  The patient was awake, alert and did not appear to be in acute distress, was laying in bed comfortably. HEENT:  Extraocular motions are intact.  Pupils equal and round.  Had moist mucous membranes. NECK:  Supple. HEART:  Irregularly irregular with S1 and S2. LUNGS:  Clear to auscultation bilaterally. ABDOMEN:  Soft, nontender, nondistended.  Positive bowel sounds. EXTREMITIES:  The patient had good peripheral pulses.  Trace edema. NEURO:  Cranial nerves grossly intact.  Had diminish strength in left upper and left lower extremity.  RADIOLOGY/IMAGING:  The patient had abdominal series which shows nonobstructive bowel gas pattern.  No free air.  No acute cardiopulmonary abnormality.  LABS:  CBC shows  white count of 2.6, hemoglobin 15.3, hematocrit 45.0, platelet count 106, INR 4.2.  Electrolytes; sodium 132, potassium 5.1, chloride is 102, BUN 43, creatinine 6.8, lactic acid 1.8.  ASSESSMENT AND PLAN: 1. Nausea, vomiting, diarrhea.  Based on the history this may be most     likely due to a viral gastroenteritis as it appears that the other     resident at the facility has had similar complaints.  However we     will check her stool for C diff as well as O and P.  Given that the     patient is dehydrated we will hydrate her gently on fluids.  Her     nausea, vomiting has improved as a result we will start her on a     clear liquid diet and slowly advance to diet as tolerated. 2. Hypotension secondary to dehydration from nausea, vomiting,     diarrhea.  The patient is on gentle hydration.  Blood pressure is     improved with hydration. 3. End-stage renal disease, Renal has been notified of the patient's     admission.  The patient gets hemodialysis on Tuesday, Thursday, and     Saturday. 4. Diabetes.  We will continue home medications with sensitive sliding-     scale. 5. History of coronary artery disease.  We will trend her troponins     and we will admit her to Telemetry. 6. History of atrial fibrillation.  The patient is rate controlled,     continue anticoagulation. 7. Hypertension.  The patient is now hypotensive.  We will continue     metoprolol with full parameters for systolic less than 110.  We     will also continue amlodipine again with full parameters. 8. History of cerebrovascular accident with left-sided deficits     stable, not an active issue at this time. 9. Anemia resolved.  The patient may be hemoconcentrated with     dehydration. 10.Mild leukopenia and thrombocytopenia.  Continue to monitor for now. 11.Prophylaxis. The patient is anticoagulated on Coumadin.  INR is     supratherapeutic. 12.Code status.  This was discussed with the patient at the time of      admission she indicated that she wished to be full code.  Disposition pending.  TIME SPENT:  Time spent on admission talking to the patient and coordinating care was 45 minutes.     Andreas Blower, MD     SR/MEDQ  D:  05/06/2010  T:  05/06/2010  Job:  [id number]  Electronically Signed by Wardell Heath Shoshannah Faubert  on 05/06/2010 08:21:39 PM

## 2010-05-07 ENCOUNTER — Inpatient Hospital Stay (HOSPITAL_COMMUNITY): Payer: Medicare Other

## 2010-05-07 DIAGNOSIS — T827XXA Infection and inflammatory reaction due to other cardiac and vascular devices, implants and grafts, initial encounter: Secondary | ICD-10-CM

## 2010-05-07 DIAGNOSIS — N186 End stage renal disease: Secondary | ICD-10-CM

## 2010-05-07 DIAGNOSIS — I120 Hypertensive chronic kidney disease with stage 5 chronic kidney disease or end stage renal disease: Secondary | ICD-10-CM

## 2010-05-07 LAB — GLUCOSE, CAPILLARY: Glucose-Capillary: 86 mg/dL (ref 70–99)

## 2010-05-07 LAB — CBC
HCT: 36 % (ref 36.0–46.0)
MCHC: 32.5 g/dL (ref 30.0–36.0)
RDW: 18.6 % — ABNORMAL HIGH (ref 11.5–15.5)

## 2010-05-07 LAB — RENAL FUNCTION PANEL
Albumin: 3 g/dL — ABNORMAL LOW (ref 3.5–5.2)
Calcium: 8.4 mg/dL (ref 8.4–10.5)
Creatinine, Ser: 8.29 mg/dL — ABNORMAL HIGH (ref 0.4–1.2)
GFR calc Af Amer: 6 mL/min — ABNORMAL LOW (ref >60)
GFR calc non Af Amer: 5 mL/min — ABNORMAL LOW (ref >60)

## 2010-05-08 LAB — GLUCOSE, CAPILLARY
Glucose-Capillary: 163 mg/dL — ABNORMAL HIGH (ref 70–99)
Glucose-Capillary: 203 mg/dL — ABNORMAL HIGH (ref 70–99)
Glucose-Capillary: 242 mg/dL — ABNORMAL HIGH (ref 70–99)

## 2010-05-08 LAB — RENAL FUNCTION PANEL
BUN: 30 mg/dL — ABNORMAL HIGH (ref 6–23)
CO2: 28 mEq/L (ref 19–32)
Chloride: 96 mEq/L (ref 96–112)
Creatinine, Ser: 5.2 mg/dL — ABNORMAL HIGH (ref 0.4–1.2)
GFR calc non Af Amer: 8 mL/min — ABNORMAL LOW (ref >60)
Glucose, Bld: 173 mg/dL — ABNORMAL HIGH (ref 70–99)

## 2010-05-08 LAB — CBC
HCT: 36.2 % (ref 36.0–46.0)
MCV: 87.4 fL (ref 78.0–100.0)
RBC: 4.14 MIL/uL (ref 3.87–5.11)
RDW: 18.7 % — ABNORMAL HIGH (ref 11.5–15.5)
WBC: 4.5 10*3/uL (ref 4.0–10.5)

## 2010-05-08 LAB — GIARDIA/CRYPTOSPORIDIUM SCREEN(EIA)

## 2010-05-09 ENCOUNTER — Inpatient Hospital Stay (HOSPITAL_COMMUNITY): Payer: Medicare Other

## 2010-05-09 LAB — CBC
HCT: 31.6 % — ABNORMAL LOW (ref 36.0–46.0)
MCHC: 31.3 g/dL (ref 30.0–36.0)
MCV: 86.3 fL (ref 78.0–100.0)
RDW: 18.2 % — ABNORMAL HIGH (ref 11.5–15.5)

## 2010-05-09 LAB — PROTIME-INR: Prothrombin Time: 17 seconds — ABNORMAL HIGH (ref 11.6–15.2)

## 2010-05-09 LAB — WOUND CULTURE

## 2010-05-09 LAB — GLUCOSE, CAPILLARY
Glucose-Capillary: 163 mg/dL — ABNORMAL HIGH (ref 70–99)
Glucose-Capillary: 153 mg/dL — ABNORMAL HIGH (ref 70–99)

## 2010-05-09 LAB — BASIC METABOLIC PANEL
BUN: 54 mg/dL — ABNORMAL HIGH (ref 6–23)
GFR calc non Af Amer: 6 mL/min — ABNORMAL LOW (ref >60)
Glucose, Bld: 190 mg/dL — ABNORMAL HIGH (ref 70–99)
Potassium: 3.6 mEq/L (ref 3.5–5.1)

## 2010-05-10 LAB — BASIC METABOLIC PANEL
BUN: 25 mg/dL — ABNORMAL HIGH (ref 6–23)
Creatinine, Ser: 4.17 mg/dL — ABNORMAL HIGH (ref 0.4–1.2)
GFR calc non Af Amer: 10 mL/min — ABNORMAL LOW (ref >60)

## 2010-05-10 LAB — CBC
MCHC: 31.3 g/dL (ref 30.0–36.0)
Platelets: 116 10*3/uL — ABNORMAL LOW (ref 150–400)
RDW: 18.1 % — ABNORMAL HIGH (ref 11.5–15.5)

## 2010-05-10 LAB — PROTIME-INR
INR: 0.99 (ref 0.00–1.49)
Prothrombin Time: 13.3 seconds (ref 11.6–15.2)

## 2010-05-10 LAB — GLUCOSE, CAPILLARY: Glucose-Capillary: 117 mg/dL — ABNORMAL HIGH (ref 70–99)

## 2010-05-11 DIAGNOSIS — I120 Hypertensive chronic kidney disease with stage 5 chronic kidney disease or end stage renal disease: Secondary | ICD-10-CM

## 2010-05-11 DIAGNOSIS — T827XXA Infection and inflammatory reaction due to other cardiac and vascular devices, implants and grafts, initial encounter: Secondary | ICD-10-CM

## 2010-05-11 DIAGNOSIS — N186 End stage renal disease: Secondary | ICD-10-CM

## 2010-05-11 LAB — CBC
HCT: 31.6 % — ABNORMAL LOW (ref 36.0–46.0)
MCH: 27.1 pg (ref 26.0–34.0)
MCV: 86.6 fL (ref 78.0–100.0)
Platelets: 130 10*3/uL — ABNORMAL LOW (ref 150–400)
RDW: 17.9 % — ABNORMAL HIGH (ref 11.5–15.5)
WBC: 4.6 10*3/uL (ref 4.0–10.5)

## 2010-05-11 LAB — GLUCOSE, CAPILLARY
Glucose-Capillary: 168 mg/dL — ABNORMAL HIGH (ref 70–99)
Glucose-Capillary: 186 mg/dL — ABNORMAL HIGH (ref 70–99)

## 2010-05-11 LAB — SURGICAL PCR SCREEN
MRSA, PCR: NEGATIVE
Staphylococcus aureus: NEGATIVE

## 2010-05-11 LAB — COMPREHENSIVE METABOLIC PANEL
Alkaline Phosphatase: 165 U/L — ABNORMAL HIGH (ref 39–117)
BUN: 63 mg/dL — ABNORMAL HIGH (ref 6–23)
Chloride: 99 mEq/L (ref 96–112)
Creatinine, Ser: 6.79 mg/dL — ABNORMAL HIGH (ref 0.4–1.2)
Glucose, Bld: 265 mg/dL — ABNORMAL HIGH (ref 70–99)
Potassium: 4.6 mEq/L (ref 3.5–5.1)
Total Bilirubin: 0.5 mg/dL (ref 0.3–1.2)
Total Protein: 6.7 g/dL (ref 6.0–8.3)

## 2010-05-11 LAB — PHOSPHORUS: Phosphorus: 2.1 mg/dL — ABNORMAL LOW (ref 2.3–4.6)

## 2010-05-11 NOTE — Consult Note (Signed)
NAMEBICH, MCHANEY             ACCOUNT NO.:  [id number]  MEDICAL RECORD NO.:  [id number]           PATIENT TYPE:  I  LOCATION:  6743                         FACILITY:  [facility]  PHYSICIAN:  Fransisco Hertz, MD       DATE OF BIRTH:  April 25, 1929  DATE OF CONSULTATION:  05/07/2010 DATE OF DISCHARGE:                                CONSULTATION   REQUESTING PHYSICIAN:  Dr. Allena Katz.  REASON FOR CONSULTATION:  Possible infected arteriovenous graft in left arm.  HISTORY OF PRESENT ILLNESS:  This is a 77 year old female known to our service from previous graft excision.  It looks like in January 24, 2010, she underwent a partial graft excision by Dr. Myra Gianotti.  She presents on February 25 with history of nausea, vomiting, and diarrhea. Apparently multiple other members at the nursing home she is a resident at have had the same symptomatology.  She denies any fever or chills per se, however, her history is a little bit limited by her known history of dementia.  She denies any pain in her left arm except when examined. She is not certain whether or not she had been draining from her left arm though apparently when she arrived to the ER she did have a bandage on her arm.  She is not able to fully detail her prior access history and her complete access history is not available.  She was transferred to [facility] in 2008.  It appears that some of her previous access procedures were done at outside facilities.  PAST MEDICAL HISTORY: 1. History of repeat MRSA bacteremia. 2. History of infected left upper arm arteriovenous graft as noted     above. 3. End-stage renal disease, requiring hemodialysis. 4. History of dementia. 5. Chronic atrial fibrillation, on chronic anticoagulation. 6. History of stroke with left-sided deficit and contracture. 7. Diabetes. 8. Hypertension. 9. Anemia of chronic disease. 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 11.Prior pneumonia. 12.Coronary artery disease.  PAST SURGICAL HISTORY:   Reconstructed from the chart includes some type of forearm loop graft in the left arm and also a left upper arm graft constructed at a different location.  She underwent a partial excision of her left upper arm graft as I noted in January 21, 2010 and then had subsequently a right internal jugular vein tunnel dialysis catheter placed on February 15.  She has also undergone attempt at stenting of the venous outflow and declotting of her access on December 26, 2009 and then she has had some type of right femur arthroplasty due to a fracture dislocation.  SOCIAL HISTORY:  Quit smoking about 30 years ago.  Currently is a resident of [address] [facility].  Denies any alcohol or illicit drug use.  FAMILY HISTORY:  Her chart reveals mother and brother had diabetes. There is no data in regards to the father.  REVIEW OF SYSTEMS:  As listed above and unfortunately, I was able to get more than this as the patient's memory is somewhat limited.  PHYSICAL EXAMINATION:  VITAL SIGNS:  She had temperature of 97.9, blood pressure 102/60, heart rate of 76, respirations were 19, and saturating 97% on room air.  GENERAL:  She appears cachectic and no apparent distress, alert to self and location. HEENT:  Head, normocephalic and atraumatic.  Some temporalis wasting. ENT; her hearing is grossly intact.  Her oropharynx did not demonstrate any obvious exudate, but there was some erythema in the back of her throat.  Nares without any drainage or any erythema.  Eyes, pupils were equal, round, and reactive to light.  Extraocular movements were intact. NECK:  There was no JVD.  The neck was supple without any nuchal rigidity.  No obvious lymphadenopathy. PULMONARY:  She has symmetric expansion.  Good air movement.  No rales, rhonchi, or wheezing were noted. CARDIAC:  Irregularly irregular rate and rhythm.  No murmurs, rubs, thrills, or gallops were noted. VASCULAR:  She had a palpable right radial and  brachial.  I can feel the left brachial with some difficulty but I do not appreciate a radial on the left.  Carotid both sides were palpable without any bruits.  I do not appreciate a strong aortic pulsation.  Femorals were palpable bilaterally as were pedal pulses. GI:  She had a soft abdomen that was nontender and nondistended.  No guarding.  No rebound.  No hepatosplenomegaly.  No obvious masses. MUSCULOSKELETAL:  She had 5/5 hand grip in upper extremity.  Motor exam was somewhat limited due to her residual deficits from her stroke.  Her left hand is in a contracted position with a weak hand grip, may be 1- 2/5.  She is able to fully move her right leg.  Her left leg, she is able to move at a slower rate and that is definitely weaker than the right leg.  In regards to the left arm, there is frank purulence draining from the antecubital area immediately adjacent to the palpable residual graft.  Also in the upper arm, there appears to be a healing sinus where apparently previously a portion of her graft was removed. There was no frank purulence from this wound, however, her antecubital area is quite warm to palpation and also has some tenderness to touch.Her forearm has a palpable graft, but there is no frank tenderness to palpating this graft.  It does not appear obviously erythematous. NEUROLOGIC:  Her cranial nerves were intact.  Her motor was as listed above.  Sensation was intact on both sides including the left side per the patient. PSYCHIATRIC:  Judgment appears to be intact.  She seems to be able to carry on conversation but points her memory prevents her from answering questions fully.  She has somewhat tearful affect and mood. SKIN:  I did note any frank rashes anywhere else. EXTREMITIES:  As listed above. LYMPHATIC:  I did not appreciate any axillary, inguinal, or cervical lymphadenopathy.  LABORATORY STUDIES:  She had a wound culture grew from the left arm some few  gram-positive cocci in pairs.  The most recent PT was that of 43.1 with an INR corresponding of 4.57.  Two sets of blood cultures that are negative to date.  CBC; white count of 10.2, H and H 11.7 and 36.0, and platelet count 133.  Chemistries; sodium 138, potassium 5.2, chloride of 95, bicarb of 22, glucose 128, BUN of 61, and creatinine is 8.3.  She had a troponin that was done as screening test 0.04.  Venous lactate was 1.8.  MEDICAL DECISION-MAKING:  This is a 77 year old female with multiple comorbidities who was presenting now with likely infection of her residual portion of the left upper arm arteriovenous graft.  In reviewing Dr.  Brabham's note, it appeared that previously the graft had more or less disintegrated intraoperatively and he had sewn off the arterial end of this graft to control the hemorrhage intraoperatively. Unfortunately, this portion of the graft appears to become infected at this point, and will need to be removed.  Additionally, this patient's  hypotension has improved and in fact her hypotension may be related to her underlying diarrhea rather than this graft infection as the graft infection is still active but the blood pressure has normalized.   She is currently getting broad-spectrum antibiotics at this point.  I would  continue the antibiotics.  Her anticoagulation is going to need to be reversed,  otherwise it will not be safe to proceed forward with excision of this graft.   Given that she is supratherapeutic INR at 4.57, at this point, I suspect it  would take a few days for this to drift down.  I will defer to the primary team whether or not they want to give vitamin K or FFP but tentatively I will be aiming for possibly Wednesday or Thursday operative date.  I will discuss this in terms of planning with Dr. Myra Gianotti to see if he will have time to take this patient back to the operating room.  Otherwise, we will arrange to have this removed by another  member of our group on Wednesday or Thursday.  Thank you for giving [place] the opportunity to participate in this patient's care.     Fransisco Hertz, MD     BLC/MEDQ  D:  05/07/2010  T:  05/08/2010  Job:  [id number]  Electronically Signed by Leonides Sake MD on 05/11/2010 05:32:36 PM

## 2010-05-12 ENCOUNTER — Ambulatory Visit: Payer: Medicare Other

## 2010-05-12 LAB — CULTURE, BLOOD (ROUTINE X 2): Culture: NO GROWTH

## 2010-05-12 LAB — GLUCOSE, CAPILLARY
Glucose-Capillary: 126 mg/dL — ABNORMAL HIGH (ref 70–99)
Glucose-Capillary: 97 mg/dL (ref 70–99)

## 2010-05-12 LAB — PROTIME-INR: Prothrombin Time: 15.1 seconds (ref 11.6–15.2)

## 2010-05-13 ENCOUNTER — Inpatient Hospital Stay (HOSPITAL_COMMUNITY)
Admission: RE | Admit: 2010-05-13 | Discharge: 2010-05-13 | Disposition: A | Discharge status: Home / Self Care | Payer: Medicare Other | Source: Ambulatory Visit

## 2010-05-13 LAB — RENAL FUNCTION PANEL
Albumin: 2.6 g/dL — ABNORMAL LOW (ref 3.5–5.2)
BUN: 33 mg/dL — ABNORMAL HIGH (ref 6–23)
Calcium: 8.8 mg/dL (ref 8.4–10.5)
Chloride: 99 mEq/L (ref 96–112)
Creatinine, Ser: 5.04 mg/dL — ABNORMAL HIGH (ref 0.4–1.2)

## 2010-05-13 LAB — CBC
MCH: 26.9 pg (ref 26.0–34.0)
MCHC: 31.3 g/dL (ref 30.0–36.0)
MCV: 85.9 fL (ref 78.0–100.0)
Platelets: 128 10*3/uL — ABNORMAL LOW (ref 150–400)
RBC: 3.2 MIL/uL — ABNORMAL LOW (ref 3.87–5.11)
RDW: 18.3 % — ABNORMAL HIGH (ref 11.5–15.5)

## 2010-05-13 LAB — PROTIME-INR: Prothrombin Time: 16.4 seconds — ABNORMAL HIGH (ref 11.6–15.2)

## 2010-05-14 LAB — BODY FLUID CULTURE

## 2010-05-15 DIAGNOSIS — D649 Anemia, unspecified: Secondary | ICD-10-CM

## 2010-05-15 DIAGNOSIS — I2510 Atherosclerotic heart disease of native coronary artery without angina pectoris: Secondary | ICD-10-CM

## 2010-05-15 HISTORY — DX: Atherosclerotic heart disease of native coronary artery without angina pectoris: I25.10

## 2010-05-15 HISTORY — DX: Anemia, unspecified: D64.9

## 2010-05-15 NOTE — Op Note (Signed)
Ebony [name], Ebony [name]             ACCOUNT NO.:  [id number]  MEDICAL RECORD NO.:  [id number]           PATIENT TYPE:  I  LOCATION:  6743                         FACILITY:  [facility]  PHYSICIAN:  Di Kindle. Edilia Bo, M.D.DATE OF BIRTH:  [birth year]  DATE OF PROCEDURE: DATE OF DISCHARGE:                              OPERATIVE REPORT   PREOPERATIVE DIAGNOSIS:  Retained infected segment of left upper arm arteriovenous graft.  POSTOPERATIVE DIAGNOSIS:  Retained infected segment of left upper arm arteriovenous graft.  PROCEDURE: 1. Removal of infected segment of left upper arm arteriovenous graft. 2. Bovine pericardial patch angioplasty of left brachial artery.  SURGEON:  Di Kindle. Edilia Bo, MD  ASSISTANT:  Nurse.  ANESTHESIA:  General.  INDICATIONS:  This is a 77 year old woman who previously had the majority of her upper arm graft in the left removed for infection.  She presented with a drainage from the proximal segment at the antecubital space.  Of note, the situation was complicated by the fact that she has a very significant contracture of her left upper extremity from her previous stroke and in addition, she has a history of heparin-induced thrombocytopenia.  I was asked to remove the remaining segment of her upper arm graft on the left.  TECHNIQUE:  The patient was taken to the operating room and received a general anesthetic.  I tried to extend the arm as best I could with contracture, but there was really very limited mobility at all to extend the arm.  I was able to open the arm enough to just get at the antecubital space for an incision here.  A transverse incision was made over the remaining segment of graft and dissection carried down to the infected segment graft, which was removed proximally without difficulty and then tracked it down where it was anastomosed to the brachial artery.  There was a dense scar tissue present, and it was a very difficult  dissection especially given the limited mobility of the arm, but I was able to get control of the brachial artery proximally.  The dissection was quite difficult distally, and I felt it would be safest to use the tourniquet.  For this reason, the patient received 0.2 mg/kg of Refludan.  Tourniquet was placed on the upper arm.  The arm was exsanguinated with an Esmarch bandage, and the tourniquet inflated to 250 mmHg.  Under tourniquet control, the entire graft was removed from the brachial artery.  No remaining graft  remained on the artery.  A bovine pericardial patch had been soaked in saline.  This was tailored appropriately and then sewn with a continuous 6-0 Prolene suture.  Prior to completing anastomosis, the tourniquet was released.  The artery back bled and flushed and anastomosis completed, and the tourniquet was fully released.  There was a good signal with the Doppler and also a radial and ulnar signal with the Doppler.  Next, hemostasis was obtained in the wound.  I used Gelfoam and thrombin.  The wound was then closed with deep layer of 3-0 Vicryl, and the skin closed with interrupted 3-0 nylons.  A sterile dressing was applied.  The patient tolerated  the procedure well, was transferred to the recovery room in stable condition.  All needle and sponge counts were correct.     Di Kindle. Edilia Bo, M.D.     CSD/MEDQ  D:  05/11/2010  T:  05/12/2010  Job:  [id number]  Electronically Signed by Waverly Ferrari M.D. on 05/15/2010 10:42:06 AM

## 2010-05-16 LAB — ANAEROBIC CULTURE: Gram Stain: NONE SEEN

## 2010-05-22 ENCOUNTER — Emergency Department (HOSPITAL_COMMUNITY): Payer: Medicare Other

## 2010-05-22 ENCOUNTER — Emergency Department (HOSPITAL_COMMUNITY)
Admission: EM | Admit: 2010-05-22 | Discharge: 2010-05-22 | Disposition: A | Discharge status: Home / Self Care | Payer: Medicare Other | Attending: Emergency Medicine | Admitting: Emergency Medicine

## 2010-05-22 DIAGNOSIS — M7989 Other specified soft tissue disorders: Secondary | ICD-10-CM

## 2010-05-22 DIAGNOSIS — R609 Edema, unspecified: Secondary | ICD-10-CM

## 2010-05-22 DIAGNOSIS — M79609 Pain in unspecified limb: Secondary | ICD-10-CM | POA: Insufficient documentation

## 2010-05-22 DIAGNOSIS — F068 Other specified mental disorders due to known physiological condition: Secondary | ICD-10-CM | POA: Insufficient documentation

## 2010-05-22 DIAGNOSIS — I129 Hypertensive chronic kidney disease with stage 1 through stage 4 chronic kidney disease, or unspecified chronic kidney disease: Secondary | ICD-10-CM | POA: Insufficient documentation

## 2010-05-22 DIAGNOSIS — K219 Gastro-esophageal reflux disease without esophagitis: Secondary | ICD-10-CM | POA: Insufficient documentation

## 2010-05-22 DIAGNOSIS — Z79899 Other long term (current) drug therapy: Secondary | ICD-10-CM | POA: Insufficient documentation

## 2010-05-22 DIAGNOSIS — Z8673 Personal history of transient ischemic attack (TIA), and cerebral infarction without residual deficits: Secondary | ICD-10-CM | POA: Insufficient documentation

## 2010-05-22 DIAGNOSIS — I4891 Unspecified atrial fibrillation: Secondary | ICD-10-CM | POA: Insufficient documentation

## 2010-05-22 DIAGNOSIS — Z7901 Long term (current) use of anticoagulants: Secondary | ICD-10-CM | POA: Insufficient documentation

## 2010-05-22 DIAGNOSIS — I2510 Atherosclerotic heart disease of native coronary artery without angina pectoris: Secondary | ICD-10-CM | POA: Insufficient documentation

## 2010-05-22 DIAGNOSIS — E119 Type 2 diabetes mellitus without complications: Secondary | ICD-10-CM | POA: Insufficient documentation

## 2010-05-22 DIAGNOSIS — Z794 Long term (current) use of insulin: Secondary | ICD-10-CM | POA: Insufficient documentation

## 2010-05-22 DIAGNOSIS — N189 Chronic kidney disease, unspecified: Secondary | ICD-10-CM | POA: Insufficient documentation

## 2010-05-22 DIAGNOSIS — Z992 Dependence on renal dialysis: Secondary | ICD-10-CM | POA: Insufficient documentation

## 2010-05-22 HISTORY — DX: Edema, unspecified: R60.9

## 2010-05-22 LAB — CBC
HCT: 31.9 % — ABNORMAL LOW (ref 36.0–46.0)
Hemoglobin: 9.7 g/dL — ABNORMAL LOW (ref 12.0–15.0)
MCH: 27.2 pg (ref 26.0–34.0)
MCHC: 30.4 g/dL (ref 30.0–36.0)

## 2010-05-22 LAB — DIFFERENTIAL
Basophils Relative: 0 % (ref 0–1)
Eosinophils Relative: 4 % (ref 0–5)
Monocytes Absolute: 0.5 10*3/uL (ref 0.1–1.0)
Monocytes Relative: 11 % (ref 3–12)
Neutro Abs: 2.4 10*3/uL (ref 1.7–7.7)

## 2010-05-22 LAB — POCT I-STAT, CHEM 8
Creatinine, Ser: 6.6 mg/dL — ABNORMAL HIGH (ref 0.4–1.2)
Glucose, Bld: 203 mg/dL — ABNORMAL HIGH (ref 70–99)
Hemoglobin: 11.6 g/dL — ABNORMAL LOW (ref 12.0–15.0)
Sodium: 132 mEq/L — ABNORMAL LOW (ref 135–145)
TCO2: 28 mmol/L (ref 0–100)

## 2010-05-22 LAB — PROTIME-INR: INR: 1.66 — ABNORMAL HIGH (ref 0.00–1.49)

## 2010-05-23 ENCOUNTER — Encounter: Payer: Self-pay | Admitting: *Deleted

## 2010-05-23 LAB — GLUCOSE, CAPILLARY
Glucose-Capillary: 107 mg/dL — ABNORMAL HIGH (ref 70–99)
Glucose-Capillary: 112 mg/dL — ABNORMAL HIGH (ref 70–99)
Glucose-Capillary: 115 mg/dL — ABNORMAL HIGH (ref 70–99)
Glucose-Capillary: 130 mg/dL — ABNORMAL HIGH (ref 70–99)
Glucose-Capillary: 131 mg/dL — ABNORMAL HIGH (ref 70–99)
Glucose-Capillary: 133 mg/dL — ABNORMAL HIGH (ref 70–99)
Glucose-Capillary: 147 mg/dL — ABNORMAL HIGH (ref 70–99)
Glucose-Capillary: 153 mg/dL — ABNORMAL HIGH (ref 70–99)
Glucose-Capillary: 156 mg/dL — ABNORMAL HIGH (ref 70–99)
Glucose-Capillary: 182 mg/dL — ABNORMAL HIGH (ref 70–99)
Glucose-Capillary: 248 mg/dL — ABNORMAL HIGH (ref 70–99)
Glucose-Capillary: 40 mg/dL — CL (ref 70–99)
Glucose-Capillary: 47 mg/dL — ABNORMAL LOW (ref 70–99)
Glucose-Capillary: 64 mg/dL — ABNORMAL LOW (ref 70–99)
Glucose-Capillary: 72 mg/dL (ref 70–99)
Glucose-Capillary: 83 mg/dL (ref 70–99)
Glucose-Capillary: 86 mg/dL (ref 70–99)
Glucose-Capillary: 90 mg/dL (ref 70–99)

## 2010-05-23 LAB — CBC
HCT: 27 % — ABNORMAL LOW (ref 36.0–46.0)
HCT: 28.4 % — ABNORMAL LOW (ref 36.0–46.0)
HCT: 35.2 % — ABNORMAL LOW (ref 36.0–46.0)
Hemoglobin: 10.4 g/dL — ABNORMAL LOW (ref 12.0–15.0)
Hemoglobin: 8.3 g/dL — ABNORMAL LOW (ref 12.0–15.0)
Hemoglobin: 8.5 g/dL — ABNORMAL LOW (ref 12.0–15.0)
Hemoglobin: 9.3 g/dL — ABNORMAL LOW (ref 12.0–15.0)
MCH: 23.9 pg — ABNORMAL LOW (ref 26.0–34.0)
MCH: 24 pg — ABNORMAL LOW (ref 26.0–34.0)
MCH: 24.4 pg — ABNORMAL LOW (ref 26.0–34.0)
MCH: 24.8 pg — ABNORMAL LOW (ref 26.0–34.0)
MCHC: 29.6 g/dL — ABNORMAL LOW (ref 30.0–36.0)
MCHC: 30.5 g/dL (ref 30.0–36.0)
MCHC: 30.7 g/dL (ref 30.0–36.0)
MCHC: 30.7 g/dL (ref 30.0–36.0)
MCV: 78.6 fL (ref 78.0–100.0)
MCV: 79.3 fL (ref 78.0–100.0)
MCV: 80.6 fL (ref 78.0–100.0)
MCV: 81.1 fL (ref 78.0–100.0)
Platelets: 207 10*3/uL (ref 150–400)
Platelets: 217 10*3/uL (ref 150–400)
RBC: 2.75 MIL/uL — ABNORMAL LOW (ref 3.87–5.11)
RBC: 3.5 MIL/uL — ABNORMAL LOW (ref 3.87–5.11)
RBC: 3.55 MIL/uL — ABNORMAL LOW (ref 3.87–5.11)
RDW: 18.9 % — ABNORMAL HIGH (ref 11.5–15.5)
RDW: 19.2 % — ABNORMAL HIGH (ref 11.5–15.5)
RDW: 19.2 % — ABNORMAL HIGH (ref 11.5–15.5)
WBC: 10.2 10*3/uL (ref 4.0–10.5)
WBC: 9.1 10*3/uL (ref 4.0–10.5)

## 2010-05-23 LAB — BASIC METABOLIC PANEL
BUN: 30 mg/dL — ABNORMAL HIGH (ref 6–23)
CO2: 27 mEq/L (ref 19–32)
CO2: 27 mEq/L (ref 19–32)
CO2: 29 mEq/L (ref 19–32)
Calcium: 7.6 mg/dL — ABNORMAL LOW (ref 8.4–10.5)
Chloride: 93 mEq/L — ABNORMAL LOW (ref 96–112)
Chloride: 94 mEq/L — ABNORMAL LOW (ref 96–112)
Chloride: 97 mEq/L (ref 96–112)
Chloride: 97 mEq/L (ref 96–112)
Creatinine, Ser: 5.6 mg/dL — ABNORMAL HIGH (ref 0.4–1.2)
GFR calc Af Amer: 5 mL/min — ABNORMAL LOW (ref >60)
GFR calc Af Amer: 6 mL/min — ABNORMAL LOW (ref >60)
GFR calc Af Amer: 9 mL/min — ABNORMAL LOW (ref >60)
GFR calc non Af Amer: 6 mL/min — ABNORMAL LOW (ref >60)
Glucose, Bld: 128 mg/dL — ABNORMAL HIGH (ref 70–99)
Glucose, Bld: 234 mg/dL — ABNORMAL HIGH (ref 70–99)
Glucose, Bld: 87 mg/dL (ref 70–99)
Potassium: 4.1 mEq/L (ref 3.5–5.1)
Potassium: 4.1 mEq/L (ref 3.5–5.1)
Potassium: 4.3 mEq/L (ref 3.5–5.1)
Sodium: 131 mEq/L — ABNORMAL LOW (ref 135–145)
Sodium: 131 mEq/L — ABNORMAL LOW (ref 135–145)
Sodium: 132 mEq/L — ABNORMAL LOW (ref 135–145)
Sodium: 135 mEq/L (ref 135–145)
Sodium: 137 mEq/L (ref 135–145)

## 2010-05-23 LAB — CROSSMATCH
ABO/RH(D): O POS
ABO/RH(D): O POS
Antibody Screen: NEGATIVE
Antibody Screen: NEGATIVE
Unit division: 0
Unit division: 0
Unit division: 0

## 2010-05-23 LAB — WOUND CULTURE

## 2010-05-23 LAB — DIFFERENTIAL
Basophils Absolute: 0 10*3/uL (ref 0.0–0.1)
Basophils Absolute: 0 10*3/uL (ref 0.0–0.1)
Basophils Relative: 0 % (ref 0–1)
Eosinophils Absolute: 0 10*3/uL (ref 0.0–0.7)
Lymphocytes Relative: 16 % (ref 12–46)
Monocytes Absolute: 0.8 10*3/uL (ref 0.1–1.0)
Monocytes Absolute: 0.8 10*3/uL (ref 0.1–1.0)
Monocytes Relative: 10 % (ref 3–12)
Monocytes Relative: 9 % (ref 3–12)
Neutro Abs: 6.8 10*3/uL (ref 1.7–7.7)
Neutrophils Relative %: 76 % (ref 43–77)

## 2010-05-23 LAB — PREPARE FRESH FROZEN PLASMA: Unit division: 0

## 2010-05-23 LAB — RENAL FUNCTION PANEL
Albumin: 1.8 g/dL — ABNORMAL LOW (ref 3.5–5.2)
BUN: 43 mg/dL — ABNORMAL HIGH (ref 6–23)
Calcium: 7.4 mg/dL — ABNORMAL LOW (ref 8.4–10.5)
Chloride: 97 mEq/L (ref 96–112)
Creatinine, Ser: 9.39 mg/dL — ABNORMAL HIGH (ref 0.4–1.2)
GFR calc Af Amer: 5 mL/min — ABNORMAL LOW (ref >60)
GFR calc non Af Amer: 4 mL/min — ABNORMAL LOW (ref >60)
Phosphorus: 5 mg/dL — ABNORMAL HIGH (ref 2.3–4.6)

## 2010-05-23 LAB — TISSUE CULTURE

## 2010-05-23 LAB — PROTIME-INR
INR: 1.49 (ref 0.00–1.49)
INR: 2.11 — ABNORMAL HIGH (ref 0.00–1.49)
INR: 2.46 — ABNORMAL HIGH (ref 0.00–1.49)
INR: 2.69 — ABNORMAL HIGH (ref 0.00–1.49)
INR: 2.99 — ABNORMAL HIGH (ref 0.00–1.49)
Prothrombin Time: 14.7 seconds (ref 11.6–15.2)

## 2010-05-23 LAB — CULTURE, BLOOD (ROUTINE X 2)
Culture  Setup Time: 201111082245
Culture  Setup Time: 201111110815
Culture  Setup Time: 201111110815
Culture: NO GROWTH

## 2010-05-23 LAB — POCT I-STAT 4, (NA,K, GLUC, HGB,HCT)
Hemoglobin: 8.2 g/dL — ABNORMAL LOW (ref 12.0–15.0)
Potassium: 4.3 mEq/L (ref 3.5–5.1)
Potassium: 4.6 mEq/L (ref 3.5–5.1)
Sodium: 133 mEq/L — ABNORMAL LOW (ref 135–145)
Sodium: 135 mEq/L (ref 135–145)

## 2010-05-23 LAB — POCT I-STAT, CHEM 8
BUN: 28 mg/dL — ABNORMAL HIGH (ref 6–23)
Calcium, Ion: 1.02 mmol/L — ABNORMAL LOW (ref 1.12–1.32)
Creatinine, Ser: 7.1 mg/dL — ABNORMAL HIGH (ref 0.4–1.2)
TCO2: 32 mmol/L (ref 0–100)

## 2010-05-23 LAB — APTT: aPTT: 63 seconds — ABNORMAL HIGH (ref 24–37)

## 2010-05-24 ENCOUNTER — Ambulatory Visit: Payer: Medicare Other | Admitting: Vascular Surgery

## 2010-05-24 LAB — POTASSIUM: Potassium: 3.3 mEq/L — ABNORMAL LOW (ref 3.5–5.1)

## 2010-05-24 LAB — APTT: aPTT: 76 seconds — ABNORMAL HIGH (ref 24–37)

## 2010-05-24 LAB — PROTIME-INR: INR: 3.05 — ABNORMAL HIGH (ref 0.00–1.49)

## 2010-05-26 LAB — PROTIME-INR
INR: 1.27 (ref 0.00–1.49)
INR: 1.82 — ABNORMAL HIGH (ref 0.00–1.49)
INR: 1.87 — ABNORMAL HIGH (ref 0.00–1.49)
Prothrombin Time: 16.1 s — ABNORMAL HIGH (ref 11.6–15.2)
Prothrombin Time: 20 seconds — ABNORMAL HIGH (ref 11.6–15.2)
Prothrombin Time: 21.2 seconds — ABNORMAL HIGH (ref 11.6–15.2)
Prothrombin Time: 21.7 seconds — ABNORMAL HIGH (ref 11.6–15.2)

## 2010-05-26 LAB — TYPE AND SCREEN
ABO/RH(D): O POS
Antibody Screen: NEGATIVE

## 2010-05-26 LAB — RENAL FUNCTION PANEL
Albumin: 2.5 g/dL — ABNORMAL LOW (ref 3.5–5.2)
Albumin: 2.5 g/dL — ABNORMAL LOW (ref 3.5–5.2)
BUN: 21 mg/dL (ref 6–23)
BUN: 6 mg/dL (ref 6–23)
CO2: 25 mEq/L (ref 19–32)
CO2: 27 mEq/L (ref 19–32)
CO2: 30 meq/L (ref 19–32)
Calcium: 8.8 mg/dL (ref 8.4–10.5)
Calcium: 8.8 mg/dL (ref 8.4–10.5)
Calcium: 9 mg/dL (ref 8.4–10.5)
Calcium: 9.1 mg/dL (ref 8.4–10.5)
Chloride: 96 mEq/L (ref 96–112)
Chloride: 96 meq/L (ref 96–112)
Creatinine, Ser: 6.49 mg/dL — ABNORMAL HIGH (ref 0.4–1.2)
GFR calc Af Amer: 10 mL/min — ABNORMAL LOW (ref >60)
GFR calc Af Amer: 9 mL/min — ABNORMAL LOW (ref >60)
GFR calc non Af Amer: 6 mL/min — ABNORMAL LOW
GFR calc non Af Amer: 8 mL/min — ABNORMAL LOW (ref >60)
GFR calc non Af Amer: 8 mL/min — ABNORMAL LOW (ref >60)
Glucose, Bld: 127 mg/dL — ABNORMAL HIGH (ref 70–99)
Glucose, Bld: 71 mg/dL (ref 70–99)
Phosphorus: 1.9 mg/dL — ABNORMAL LOW (ref 2.3–4.6)
Phosphorus: 2.2 mg/dL — ABNORMAL LOW (ref 2.3–4.6)
Phosphorus: 2.9 mg/dL (ref 2.3–4.6)
Potassium: 4.2 meq/L (ref 3.5–5.1)
Sodium: 133 mEq/L — ABNORMAL LOW (ref 135–145)
Sodium: 135 mEq/L (ref 135–145)
Sodium: 135 meq/L (ref 135–145)

## 2010-05-26 LAB — CBC
HCT: 33.3 % — ABNORMAL LOW (ref 36.0–46.0)
HCT: 34.1 % — ABNORMAL LOW (ref 36.0–46.0)
HCT: 34.5 % — ABNORMAL LOW (ref 36.0–46.0)
HCT: 36.4 % (ref 36.0–46.0)
HCT: 38 % (ref 36.0–46.0)
Hemoglobin: 10 g/dL — ABNORMAL LOW (ref 12.0–15.0)
Hemoglobin: 10.4 g/dL — ABNORMAL LOW (ref 12.0–15.0)
Hemoglobin: 10.7 g/dL — ABNORMAL LOW (ref 12.0–15.0)
Hemoglobin: 10.8 g/dL — ABNORMAL LOW (ref 12.0–15.0)
Hemoglobin: 11.7 g/dL — ABNORMAL LOW (ref 12.0–15.0)
MCH: 24.2 pg — ABNORMAL LOW (ref 26.0–34.0)
MCH: 24.4 pg — ABNORMAL LOW (ref 26.0–34.0)
MCH: 24.5 pg — ABNORMAL LOW (ref 26.0–34.0)
MCH: 24.7 pg — ABNORMAL LOW (ref 26.0–34.0)
MCHC: 29.7 g/dL — ABNORMAL LOW (ref 30.0–36.0)
MCHC: 30 g/dL (ref 30.0–36.0)
MCHC: 30.1 g/dL (ref 30.0–36.0)
MCHC: 30.8 g/dL (ref 30.0–36.0)
MCV: 80.3 fL (ref 78.0–100.0)
MCV: 80.8 fL (ref 78.0–100.0)
MCV: 81.4 fL (ref 78.0–100.0)
MCV: 82.1 fL (ref 78.0–100.0)
Platelets: 159 K/uL (ref 150–400)
Platelets: 163 K/uL (ref 150–400)
Platelets: 171 10*3/uL (ref 150–400)
Platelets: 176 K/uL (ref 150–400)
RBC: 4.27 MIL/uL (ref 3.87–5.11)
RBC: 4.31 MIL/uL (ref 3.87–5.11)
RBC: 4.47 MIL/uL (ref 3.87–5.11)
RBC: 4.73 MIL/uL (ref 3.87–5.11)
RDW: 20.4 % — ABNORMAL HIGH (ref 11.5–15.5)
RDW: 20.5 % — ABNORMAL HIGH (ref 11.5–15.5)
RDW: 20.6 % — ABNORMAL HIGH (ref 11.5–15.5)
RDW: 20.6 % — ABNORMAL HIGH (ref 11.5–15.5)
WBC: 11 K/uL — ABNORMAL HIGH (ref 4.0–10.5)
WBC: 13.6 10*3/uL — ABNORMAL HIGH (ref 4.0–10.5)
WBC: 5.6 10*3/uL (ref 4.0–10.5)
WBC: 6.7 K/uL (ref 4.0–10.5)
WBC: 8.8 K/uL (ref 4.0–10.5)

## 2010-05-26 LAB — GLUCOSE, CAPILLARY
Glucose-Capillary: 127 mg/dL — ABNORMAL HIGH (ref 70–99)
Glucose-Capillary: 138 mg/dL — ABNORMAL HIGH (ref 70–99)
Glucose-Capillary: 141 mg/dL — ABNORMAL HIGH (ref 70–99)
Glucose-Capillary: 143 mg/dL — ABNORMAL HIGH (ref 70–99)
Glucose-Capillary: 153 mg/dL — ABNORMAL HIGH (ref 70–99)
Glucose-Capillary: 164 mg/dL — ABNORMAL HIGH (ref 70–99)
Glucose-Capillary: 171 mg/dL — ABNORMAL HIGH (ref 70–99)
Glucose-Capillary: 227 mg/dL — ABNORMAL HIGH (ref 70–99)
Glucose-Capillary: 235 mg/dL — ABNORMAL HIGH (ref 70–99)
Glucose-Capillary: 241 mg/dL — ABNORMAL HIGH (ref 70–99)
Glucose-Capillary: 61 mg/dL — ABNORMAL LOW (ref 70–99)

## 2010-05-26 LAB — CULTURE, BLOOD (ROUTINE X 2): Culture: NO GROWTH

## 2010-05-26 LAB — DIFFERENTIAL
Basophils Absolute: 0 K/uL (ref 0.0–0.1)
Basophils Relative: 0 % (ref 0–1)
Basophils Relative: 0 % (ref 0–1)
Eosinophils Absolute: 0.1 K/uL (ref 0.0–0.7)
Eosinophils Relative: 0 % (ref 0–5)
Eosinophils Relative: 2 % (ref 0–5)
Lymphocytes Relative: 15 % (ref 12–46)
Lymphs Abs: 0.2 10*3/uL — ABNORMAL LOW (ref 0.7–4.0)
Lymphs Abs: 1 K/uL (ref 0.7–4.0)
Monocytes Absolute: 0.8 K/uL (ref 0.1–1.0)
Monocytes Relative: 12 % (ref 3–12)
Monocytes Relative: 6 % (ref 3–12)
Neutro Abs: 4.8 K/uL (ref 1.7–7.7)
Neutrophils Relative %: 71 % (ref 43–77)
Neutrophils Relative %: 92 % — ABNORMAL HIGH (ref 43–77)

## 2010-05-26 LAB — LIPASE, BLOOD: Lipase: 21 U/L (ref 11–59)

## 2010-05-26 LAB — COMPREHENSIVE METABOLIC PANEL
AST: 19 U/L (ref 0–37)
Albumin: 2.9 g/dL — ABNORMAL LOW (ref 3.5–5.2)
Alkaline Phosphatase: 88 U/L (ref 39–117)
BUN: 14 mg/dL (ref 6–23)
Chloride: 95 mEq/L — ABNORMAL LOW (ref 96–112)
GFR calc Af Amer: 9 mL/min — ABNORMAL LOW (ref >60)
Potassium: 4.1 mEq/L (ref 3.5–5.1)
Total Bilirubin: 1 mg/dL (ref 0.3–1.2)
Total Protein: 7.9 g/dL (ref 6.0–8.3)

## 2010-05-26 LAB — PROCALCITONIN: Procalcitonin: 1.61 ng/mL

## 2010-05-26 LAB — VANCOMYCIN, RANDOM: Vancomycin Rm: 19.7 ug/mL

## 2010-05-26 LAB — HEMOGLOBIN A1C
Hgb A1c MFr Bld: 7 % — ABNORMAL HIGH
Mean Plasma Glucose: 154 mg/dL — ABNORMAL HIGH

## 2010-05-27 LAB — GLUCOSE, CAPILLARY
Glucose-Capillary: 102 mg/dL — ABNORMAL HIGH (ref 70–99)
Glucose-Capillary: 120 mg/dL — ABNORMAL HIGH (ref 70–99)
Glucose-Capillary: 140 mg/dL — ABNORMAL HIGH (ref 70–99)
Glucose-Capillary: 150 mg/dL — ABNORMAL HIGH (ref 70–99)
Glucose-Capillary: 166 mg/dL — ABNORMAL HIGH (ref 70–99)
Glucose-Capillary: 180 mg/dL — ABNORMAL HIGH (ref 70–99)
Glucose-Capillary: 210 mg/dL — ABNORMAL HIGH (ref 70–99)
Glucose-Capillary: 231 mg/dL — ABNORMAL HIGH (ref 70–99)
Glucose-Capillary: 72 mg/dL (ref 70–99)
Glucose-Capillary: 86 mg/dL (ref 70–99)
Glucose-Capillary: 93 mg/dL (ref 70–99)

## 2010-05-27 LAB — BASIC METABOLIC PANEL
Calcium: 8.8 mg/dL (ref 8.4–10.5)
Chloride: 99 mEq/L (ref 96–112)
Creatinine, Ser: 4.87 mg/dL — ABNORMAL HIGH (ref 0.4–1.2)
Creatinine, Ser: 7.6 mg/dL — ABNORMAL HIGH (ref 0.4–1.2)
GFR calc Af Amer: 10 mL/min — ABNORMAL LOW (ref >60)
GFR calc Af Amer: 6 mL/min — ABNORMAL LOW (ref >60)
GFR calc non Af Amer: 5 mL/min — ABNORMAL LOW (ref >60)
GFR calc non Af Amer: 9 mL/min — ABNORMAL LOW (ref >60)

## 2010-05-27 LAB — DIFFERENTIAL
Basophils Absolute: 0.1 10*3/uL (ref 0.0–0.1)
Basophils Relative: 1 % (ref 0–1)
Eosinophils Absolute: 0 10*3/uL (ref 0.0–0.7)
Eosinophils Relative: 0 % (ref 0–5)
Eosinophils Relative: 1 % (ref 0–5)
Lymphocytes Relative: 12 % (ref 12–46)
Lymphocytes Relative: 6 % — ABNORMAL LOW (ref 12–46)
Lymphs Abs: 0.8 10*3/uL (ref 0.7–4.0)
Lymphs Abs: 0.8 10*3/uL (ref 0.7–4.0)
Monocytes Absolute: 0.5 10*3/uL (ref 0.1–1.0)
Monocytes Absolute: 1 10*3/uL (ref 0.1–1.0)
Monocytes Absolute: 1 10*3/uL (ref 0.1–1.0)
Monocytes Relative: 7 % (ref 3–12)
Neutro Abs: 12.6 10*3/uL — ABNORMAL HIGH (ref 1.7–7.7)
Neutrophils Relative %: 83 % — ABNORMAL HIGH (ref 43–77)

## 2010-05-27 LAB — PROTIME-INR
INR: 3.58 — ABNORMAL HIGH (ref 0.00–1.49)
INR: 4.25 — ABNORMAL HIGH (ref 0.00–1.49)
Prothrombin Time: 25.8 seconds — ABNORMAL HIGH (ref 11.6–15.2)
Prothrombin Time: 35.5 seconds — ABNORMAL HIGH (ref 11.6–15.2)
Prothrombin Time: 40.6 seconds — ABNORMAL HIGH (ref 11.6–15.2)

## 2010-05-27 LAB — CBC
HCT: 36.4 % (ref 36.0–46.0)
HCT: 36.7 % (ref 36.0–46.0)
Hemoglobin: 11.4 g/dL — ABNORMAL LOW (ref 12.0–15.0)
Hemoglobin: 11.6 g/dL — ABNORMAL LOW (ref 12.0–15.0)
Hemoglobin: 12.5 g/dL (ref 12.0–15.0)
MCH: 26.4 pg (ref 26.0–34.0)
MCH: 26.7 pg (ref 26.0–34.0)
MCH: 26.7 pg (ref 26.0–34.0)
MCHC: 31.4 g/dL (ref 30.0–36.0)
MCV: 84.2 fL (ref 78.0–100.0)
MCV: 84.6 fL (ref 78.0–100.0)
MCV: 85.1 fL (ref 78.0–100.0)
MCV: 85.4 fL (ref 78.0–100.0)
Platelets: 138 10*3/uL — ABNORMAL LOW (ref 150–400)
Platelets: 146 10*3/uL — ABNORMAL LOW (ref 150–400)
Platelets: 164 10*3/uL (ref 150–400)
RBC: 4.13 MIL/uL (ref 3.87–5.11)
RBC: 4.31 MIL/uL (ref 3.87–5.11)
RBC: 4.32 MIL/uL (ref 3.87–5.11)
RBC: 4.5 MIL/uL (ref 3.87–5.11)
RBC: 4.96 MIL/uL (ref 3.87–5.11)
RDW: 23 % — ABNORMAL HIGH (ref 11.5–15.5)
RDW: 23 % — ABNORMAL HIGH (ref 11.5–15.5)
WBC: 14.4 10*3/uL — ABNORMAL HIGH (ref 4.0–10.5)
WBC: 5.4 10*3/uL (ref 4.0–10.5)
WBC: 6.5 10*3/uL (ref 4.0–10.5)
WBC: 6.6 10*3/uL (ref 4.0–10.5)
WBC: 7.6 10*3/uL (ref 4.0–10.5)

## 2010-05-27 LAB — COMPREHENSIVE METABOLIC PANEL
AST: 23 U/L (ref 0–37)
Albumin: 3.4 g/dL — ABNORMAL LOW (ref 3.5–5.2)
BUN: 30 mg/dL — ABNORMAL HIGH (ref 6–23)
Chloride: 93 mEq/L — ABNORMAL LOW (ref 96–112)
Creatinine, Ser: 6.03 mg/dL — ABNORMAL HIGH (ref 0.4–1.2)
GFR calc Af Amer: 8 mL/min — ABNORMAL LOW (ref >60)
Total Bilirubin: 1.4 mg/dL — ABNORMAL HIGH (ref 0.3–1.2)
Total Protein: 8.2 g/dL (ref 6.0–8.3)

## 2010-05-27 LAB — RENAL FUNCTION PANEL
Albumin: 2.5 g/dL — ABNORMAL LOW (ref 3.5–5.2)
Albumin: 2.5 g/dL — ABNORMAL LOW (ref 3.5–5.2)
BUN: 35 mg/dL — ABNORMAL HIGH (ref 6–23)
BUN: 53 mg/dL — ABNORMAL HIGH (ref 6–23)
CO2: 24 mEq/L (ref 19–32)
CO2: 28 mEq/L (ref 19–32)
Chloride: 96 mEq/L (ref 96–112)
Chloride: 99 mEq/L (ref 96–112)
Creatinine, Ser: 6.51 mg/dL — ABNORMAL HIGH (ref 0.4–1.2)
Glucose, Bld: 224 mg/dL — ABNORMAL HIGH (ref 70–99)
Potassium: 4.6 mEq/L (ref 3.5–5.1)
Potassium: 4.8 mEq/L (ref 3.5–5.1)

## 2010-05-27 LAB — CULTURE, BLOOD (ROUTINE X 2)

## 2010-05-27 LAB — POCT I-STAT, CHEM 8
BUN: 32 mg/dL — ABNORMAL HIGH (ref 6–23)
Calcium, Ion: 1.08 mmol/L — ABNORMAL LOW (ref 1.12–1.32)
Chloride: 98 mEq/L (ref 96–112)
Creatinine, Ser: 6.4 mg/dL — ABNORMAL HIGH (ref 0.4–1.2)
Glucose, Bld: 232 mg/dL — ABNORMAL HIGH (ref 70–99)
TCO2: 28 mmol/L (ref 0–100)

## 2010-05-27 LAB — HEMOGLOBIN A1C: Hgb A1c MFr Bld: 6.3 % — ABNORMAL HIGH (ref <5.7)

## 2010-05-27 LAB — POCT CARDIAC MARKERS: Troponin i, poc: <0.05 ng/mL (ref 0.00–0.09)

## 2010-05-27 LAB — MRSA PCR SCREENING: MRSA by PCR: NEGATIVE

## 2010-05-30 LAB — CBC
HCT: 23.6 % — ABNORMAL LOW (ref 36.0–46.0)
HCT: 23.6 % — ABNORMAL LOW (ref 36.0–46.0)
HCT: 24.4 % — ABNORMAL LOW (ref 36.0–46.0)
HCT: 26.9 % — ABNORMAL LOW (ref 36.0–46.0)
HCT: 27.2 % — ABNORMAL LOW (ref 36.0–46.0)
HCT: 28.2 % — ABNORMAL LOW (ref 36.0–46.0)
HCT: 36.5 % (ref 36.0–46.0)
HCT: 22.9 % — ABNORMAL LOW (ref 36.0–46.0)
HCT: 24.6 % — ABNORMAL LOW (ref 36.0–46.0)
HCT: 25.5 % — ABNORMAL LOW (ref 36.0–46.0)
Hemoglobin: 7.9 g/dL — ABNORMAL LOW (ref 12.0–15.0)
Hemoglobin: 7.9 g/dL — ABNORMAL LOW (ref 12.0–15.0)
Hemoglobin: 7.9 g/dL — ABNORMAL LOW (ref 12.0–15.0)
Hemoglobin: 8.1 g/dL — ABNORMAL LOW (ref 12.0–15.0)
Hemoglobin: 9 g/dL — ABNORMAL LOW (ref 12.0–15.0)
Hemoglobin: 9.1 g/dL — ABNORMAL LOW (ref 12.0–15.0)
Hemoglobin: 10.3 g/dL — ABNORMAL LOW (ref 12.0–15.0)
Hemoglobin: 12.3 g/dL (ref 12.0–15.0)
Hemoglobin: 8.1 g/dL — ABNORMAL LOW (ref 12.0–15.0)
Hemoglobin: 9.1 g/dL — ABNORMAL LOW (ref 12.0–15.0)
MCHC: 32.6 g/dL (ref 30.0–36.0)
MCHC: 32.7 g/dL (ref 30.0–36.0)
MCHC: 33.3 g/dL (ref 30.0–36.0)
MCHC: 33.5 g/dL (ref 30.0–36.0)
MCHC: 33.8 g/dL (ref 30.0–36.0)
MCHC: 33.1 g/dL (ref 30.0–36.0)
MCHC: 33.8 g/dL (ref 30.0–36.0)
MCHC: 34.3 g/dL (ref 30.0–36.0)
MCV: 88.4 fL (ref 78.0–100.0)
MCV: 89.8 fL (ref 78.0–100.0)
MCV: 90.1 fL (ref 78.0–100.0)
MCV: 91.2 fL (ref 78.0–100.0)
MCV: 89.3 fL (ref 78.0–100.0)
Platelets: 259 10*3/uL (ref 150–400)
Platelets: 329 10*3/uL (ref 150–400)
Platelets: 491 10*3/uL — ABNORMAL HIGH (ref 150–400)
Platelets: 530 10*3/uL — ABNORMAL HIGH (ref 150–400)
Platelets: 177 10*3/uL (ref 150–400)
Platelets: 133 10*3/uL — ABNORMAL LOW (ref 150–400)
Platelets: 138 10*3/uL — ABNORMAL LOW (ref 150–400)
Platelets: 141 10*3/uL — ABNORMAL LOW (ref 150–400)
Platelets: 151 10*3/uL (ref 150–400)
Platelets: 171 10*3/uL (ref 150–400)
RBC: 2.63 MIL/uL — ABNORMAL LOW (ref 3.87–5.11)
RBC: 2.69 MIL/uL — ABNORMAL LOW (ref 3.87–5.11)
RBC: 2.71 MIL/uL — ABNORMAL LOW (ref 3.87–5.11)
RBC: 2.97 MIL/uL — ABNORMAL LOW (ref 3.87–5.11)
RBC: 3.08 MIL/uL — ABNORMAL LOW (ref 3.87–5.11)
RBC: 4.06 MIL/uL (ref 3.87–5.11)
RBC: 2.51 MIL/uL — ABNORMAL LOW (ref 3.87–5.11)
RBC: 2.74 MIL/uL — ABNORMAL LOW (ref 3.87–5.11)
RBC: 2.98 MIL/uL — ABNORMAL LOW (ref 3.87–5.11)
RDW: 17 % — ABNORMAL HIGH (ref 11.5–15.5)
RDW: 17 % — ABNORMAL HIGH (ref 11.5–15.5)
RDW: 17.3 % — ABNORMAL HIGH (ref 11.5–15.5)
RDW: 18.3 % — ABNORMAL HIGH (ref 11.5–15.5)
RDW: 16.2 % — ABNORMAL HIGH (ref 11.5–15.5)
RDW: 16.3 % — ABNORMAL HIGH (ref 11.5–15.5)
RDW: 15.9 % — ABNORMAL HIGH (ref 11.5–15.5)
RDW: 16.4 % — ABNORMAL HIGH (ref 11.5–15.5)
RDW: 16.6 % — ABNORMAL HIGH (ref 11.5–15.5)
WBC: 10.7 10*3/uL — ABNORMAL HIGH (ref 4.0–10.5)
WBC: 12.4 10*3/uL — ABNORMAL HIGH (ref 4.0–10.5)
WBC: 16.9 10*3/uL — ABNORMAL HIGH (ref 4.0–10.5)
WBC: 5.8 10*3/uL (ref 4.0–10.5)
WBC: 6 10*3/uL (ref 4.0–10.5)
WBC: 7 10*3/uL (ref 4.0–10.5)
WBC: 7 10*3/uL (ref 4.0–10.5)
WBC: 9.4 10*3/uL (ref 4.0–10.5)
WBC: 6.2 10*3/uL (ref 4.0–10.5)
WBC: 6.6 10*3/uL (ref 4.0–10.5)
WBC: 6.7 10*3/uL (ref 4.0–10.5)

## 2010-05-30 LAB — TSH: TSH: 2.017 u[IU]/mL (ref 0.350–4.500)

## 2010-05-30 LAB — COMPREHENSIVE METABOLIC PANEL
ALT: 34 U/L (ref 0–35)
AST: 51 U/L — ABNORMAL HIGH (ref 0–37)
Albumin: 3.3 g/dL — ABNORMAL LOW (ref 3.5–5.2)
Albumin: 2.7 g/dL — ABNORMAL LOW (ref 3.5–5.2)
Alkaline Phosphatase: 85 U/L (ref 39–117)
Alkaline Phosphatase: 103 U/L (ref 39–117)
BUN: 84 mg/dL — ABNORMAL HIGH (ref 6–23)
Calcium: 9 mg/dL (ref 8.4–10.5)
Calcium: 8.7 mg/dL (ref 8.4–10.5)
GFR calc Af Amer: 10 mL/min — ABNORMAL LOW (ref >60)
Glucose, Bld: 174 mg/dL — ABNORMAL HIGH (ref 70–99)
Potassium: 5.8 mEq/L — ABNORMAL HIGH (ref 3.5–5.1)
Potassium: 3.9 mEq/L (ref 3.5–5.1)
Sodium: 134 mEq/L — ABNORMAL LOW (ref 135–145)
Sodium: 137 mEq/L (ref 135–145)
Total Protein: 7.3 g/dL (ref 6.0–8.3)
Total Protein: 6 g/dL (ref 6.0–8.3)

## 2010-05-30 LAB — CARDIAC PANEL(CRET KIN+CKTOT+MB+TROPI)
CK, MB: 3 ng/mL (ref 0.3–4.0)
CK, MB: 4.4 ng/mL — ABNORMAL HIGH (ref 0.3–4.0)
CK, MB: 5.8 ng/mL — ABNORMAL HIGH (ref 0.3–4.0)
CK, MB: 2.2 ng/mL (ref 0.3–4.0)
CK, MB: 2.3 ng/mL (ref 0.3–4.0)
Relative Index: 2.3 (ref 0.0–2.5)
Relative Index: 2.8 — ABNORMAL HIGH (ref 0.0–2.5)
Relative Index: 0.6 (ref 0.0–2.5)
Relative Index: 0.7 (ref 0.0–2.5)
Relative Index: 0.9 (ref 0.0–2.5)
Total CK: 911 U/L — ABNORMAL HIGH (ref 7–177)
Total CK: 239 U/L — ABNORMAL HIGH (ref 7–177)
Troponin I: 0.06 ng/mL (ref 0.00–0.06)
Troponin I: 0.07 ng/mL — ABNORMAL HIGH (ref 0.00–0.06)
Troponin I: 0.38 ng/mL — ABNORMAL HIGH (ref 0.00–0.06)

## 2010-05-30 LAB — GLUCOSE, CAPILLARY
Glucose-Capillary: 101 mg/dL — ABNORMAL HIGH (ref 70–99)
Glucose-Capillary: 108 mg/dL — ABNORMAL HIGH (ref 70–99)
Glucose-Capillary: 110 mg/dL — ABNORMAL HIGH (ref 70–99)
Glucose-Capillary: 114 mg/dL — ABNORMAL HIGH (ref 70–99)
Glucose-Capillary: 127 mg/dL — ABNORMAL HIGH (ref 70–99)
Glucose-Capillary: 132 mg/dL — ABNORMAL HIGH (ref 70–99)
Glucose-Capillary: 135 mg/dL — ABNORMAL HIGH (ref 70–99)
Glucose-Capillary: 137 mg/dL — ABNORMAL HIGH (ref 70–99)
Glucose-Capillary: 141 mg/dL — ABNORMAL HIGH (ref 70–99)
Glucose-Capillary: 152 mg/dL — ABNORMAL HIGH (ref 70–99)
Glucose-Capillary: 175 mg/dL — ABNORMAL HIGH (ref 70–99)
Glucose-Capillary: 176 mg/dL — ABNORMAL HIGH (ref 70–99)
Glucose-Capillary: 192 mg/dL — ABNORMAL HIGH (ref 70–99)
Glucose-Capillary: 200 mg/dL — ABNORMAL HIGH (ref 70–99)
Glucose-Capillary: 200 mg/dL — ABNORMAL HIGH (ref 70–99)
Glucose-Capillary: 202 mg/dL — ABNORMAL HIGH (ref 70–99)
Glucose-Capillary: 203 mg/dL — ABNORMAL HIGH (ref 70–99)
Glucose-Capillary: 215 mg/dL — ABNORMAL HIGH (ref 70–99)
Glucose-Capillary: 223 mg/dL — ABNORMAL HIGH (ref 70–99)
Glucose-Capillary: 223 mg/dL — ABNORMAL HIGH (ref 70–99)
Glucose-Capillary: 230 mg/dL — ABNORMAL HIGH (ref 70–99)
Glucose-Capillary: 248 mg/dL — ABNORMAL HIGH (ref 70–99)
Glucose-Capillary: 261 mg/dL — ABNORMAL HIGH (ref 70–99)
Glucose-Capillary: 269 mg/dL — ABNORMAL HIGH (ref 70–99)
Glucose-Capillary: 284 mg/dL — ABNORMAL HIGH (ref 70–99)
Glucose-Capillary: 290 mg/dL — ABNORMAL HIGH (ref 70–99)
Glucose-Capillary: 53 mg/dL — ABNORMAL LOW (ref 70–99)
Glucose-Capillary: 94 mg/dL (ref 70–99)
Glucose-Capillary: 101 mg/dL — ABNORMAL HIGH (ref 70–99)
Glucose-Capillary: 153 mg/dL — ABNORMAL HIGH (ref 70–99)
Glucose-Capillary: 165 mg/dL — ABNORMAL HIGH (ref 70–99)
Glucose-Capillary: 189 mg/dL — ABNORMAL HIGH (ref 70–99)
Glucose-Capillary: 227 mg/dL — ABNORMAL HIGH (ref 70–99)
Glucose-Capillary: 71 mg/dL (ref 70–99)
Glucose-Capillary: 72 mg/dL (ref 70–99)
Glucose-Capillary: 121 mg/dL — ABNORMAL HIGH (ref 70–99)
Glucose-Capillary: 132 mg/dL — ABNORMAL HIGH (ref 70–99)
Glucose-Capillary: 137 mg/dL — ABNORMAL HIGH (ref 70–99)
Glucose-Capillary: 142 mg/dL — ABNORMAL HIGH (ref 70–99)
Glucose-Capillary: 151 mg/dL — ABNORMAL HIGH (ref 70–99)
Glucose-Capillary: 159 mg/dL — ABNORMAL HIGH (ref 70–99)
Glucose-Capillary: 169 mg/dL — ABNORMAL HIGH (ref 70–99)
Glucose-Capillary: 174 mg/dL — ABNORMAL HIGH (ref 70–99)
Glucose-Capillary: 177 mg/dL — ABNORMAL HIGH (ref 70–99)
Glucose-Capillary: 193 mg/dL — ABNORMAL HIGH (ref 70–99)
Glucose-Capillary: 250 mg/dL — ABNORMAL HIGH (ref 70–99)
Glucose-Capillary: 264 mg/dL — ABNORMAL HIGH (ref 70–99)
Glucose-Capillary: 265 mg/dL — ABNORMAL HIGH (ref 70–99)
Glucose-Capillary: 265 mg/dL — ABNORMAL HIGH (ref 70–99)
Glucose-Capillary: 266 mg/dL — ABNORMAL HIGH (ref 70–99)
Glucose-Capillary: 289 mg/dL — ABNORMAL HIGH (ref 70–99)
Glucose-Capillary: 308 mg/dL — ABNORMAL HIGH (ref 70–99)
Glucose-Capillary: 321 mg/dL — ABNORMAL HIGH (ref 70–99)
Glucose-Capillary: 324 mg/dL — ABNORMAL HIGH (ref 70–99)
Glucose-Capillary: 420 mg/dL — ABNORMAL HIGH (ref 70–99)

## 2010-05-30 LAB — PHOSPHORUS: Phosphorus: 4.2 mg/dL (ref 2.3–4.6)

## 2010-05-30 LAB — RENAL FUNCTION PANEL
Albumin: 2.2 g/dL — ABNORMAL LOW (ref 3.5–5.2)
Albumin: 2.4 g/dL — ABNORMAL LOW (ref 3.5–5.2)
Albumin: 2.4 g/dL — ABNORMAL LOW (ref 3.5–5.2)
Albumin: 3.1 g/dL — ABNORMAL LOW (ref 3.5–5.2)
Albumin: 2.4 g/dL — ABNORMAL LOW (ref 3.5–5.2)
Albumin: 2.6 g/dL — ABNORMAL LOW (ref 3.5–5.2)
Albumin: 2.6 g/dL — ABNORMAL LOW (ref 3.5–5.2)
BUN: 31 mg/dL — ABNORMAL HIGH (ref 6–23)
BUN: 32 mg/dL — ABNORMAL HIGH (ref 6–23)
BUN: 50 mg/dL — ABNORMAL HIGH (ref 6–23)
BUN: 38 mg/dL — ABNORMAL HIGH (ref 6–23)
BUN: 53 mg/dL — ABNORMAL HIGH (ref 6–23)
CO2: 25 mEq/L (ref 19–32)
CO2: 28 mEq/L (ref 19–32)
CO2: 28 mEq/L (ref 19–32)
CO2: 31 mEq/L (ref 19–32)
CO2: 32 mEq/L (ref 19–32)
CO2: 32 mEq/L (ref 19–32)
Calcium: 8.6 mg/dL (ref 8.4–10.5)
Calcium: 8.8 mg/dL (ref 8.4–10.5)
Calcium: 8.8 mg/dL (ref 8.4–10.5)
Calcium: 9.2 mg/dL (ref 8.4–10.5)
Calcium: 8.5 mg/dL (ref 8.4–10.5)
Calcium: 8.8 mg/dL (ref 8.4–10.5)
Chloride: 96 mEq/L (ref 96–112)
Chloride: 96 mEq/L (ref 96–112)
Chloride: 99 mEq/L (ref 96–112)
Chloride: 99 mEq/L (ref 96–112)
Chloride: 99 mEq/L (ref 96–112)
Chloride: 99 mEq/L (ref 96–112)
Creatinine, Ser: 4.87 mg/dL — ABNORMAL HIGH (ref 0.4–1.2)
Creatinine, Ser: 6.65 mg/dL — ABNORMAL HIGH (ref 0.4–1.2)
Creatinine, Ser: 8.46 mg/dL — ABNORMAL HIGH (ref 0.4–1.2)
Creatinine, Ser: 5.22 mg/dL — ABNORMAL HIGH (ref 0.4–1.2)
Creatinine, Ser: 3.81 mg/dL — ABNORMAL HIGH (ref 0.4–1.2)
GFR calc Af Amer: 10 mL/min — ABNORMAL LOW (ref >60)
GFR calc Af Amer: 11 mL/min — ABNORMAL LOW (ref >60)
GFR calc Af Amer: 11 mL/min — ABNORMAL LOW (ref >60)
GFR calc Af Amer: 6 mL/min — ABNORMAL LOW (ref >60)
GFR calc Af Amer: 7 mL/min — ABNORMAL LOW (ref >60)
GFR calc Af Amer: 8 mL/min — ABNORMAL LOW (ref >60)
GFR calc Af Amer: 10 mL/min — ABNORMAL LOW (ref >60)
GFR calc Af Amer: 14 mL/min — ABNORMAL LOW (ref >60)
GFR calc non Af Amer: 5 mL/min — ABNORMAL LOW (ref >60)
GFR calc non Af Amer: 6 mL/min — ABNORMAL LOW (ref >60)
GFR calc non Af Amer: 6 mL/min — ABNORMAL LOW (ref >60)
GFR calc non Af Amer: 6 mL/min — ABNORMAL LOW (ref >60)
GFR calc non Af Amer: 9 mL/min — ABNORMAL LOW (ref >60)
GFR calc non Af Amer: 9 mL/min — ABNORMAL LOW (ref >60)
GFR calc non Af Amer: 9 mL/min — ABNORMAL LOW (ref >60)
GFR calc non Af Amer: 8 mL/min — ABNORMAL LOW (ref >60)
GFR calc non Af Amer: 11 mL/min — ABNORMAL LOW (ref >60)
Glucose, Bld: 124 mg/dL — ABNORMAL HIGH (ref 70–99)
Glucose, Bld: 153 mg/dL — ABNORMAL HIGH (ref 70–99)
Glucose, Bld: 231 mg/dL — ABNORMAL HIGH (ref 70–99)
Glucose, Bld: 118 mg/dL — ABNORMAL HIGH (ref 70–99)
Phosphorus: 2.8 mg/dL (ref 2.3–4.6)
Phosphorus: 2.9 mg/dL (ref 2.3–4.6)
Phosphorus: 3.1 mg/dL (ref 2.3–4.6)
Phosphorus: 3.4 mg/dL (ref 2.3–4.6)
Phosphorus: 3.5 mg/dL (ref 2.3–4.6)
Phosphorus: 4.2 mg/dL (ref 2.3–4.6)
Phosphorus: 4.4 mg/dL (ref 2.3–4.6)
Phosphorus: 5.8 mg/dL — ABNORMAL HIGH (ref 2.3–4.6)
Phosphorus: 2.1 mg/dL — ABNORMAL LOW (ref 2.3–4.6)
Phosphorus: 2.4 mg/dL (ref 2.3–4.6)
Potassium: 3.6 mEq/L (ref 3.5–5.1)
Potassium: 3.7 mEq/L (ref 3.5–5.1)
Potassium: 3.8 mEq/L (ref 3.5–5.1)
Potassium: 4 mEq/L (ref 3.5–5.1)
Potassium: 4.1 mEq/L (ref 3.5–5.1)
Potassium: 4.1 mEq/L (ref 3.5–5.1)
Potassium: 5.6 mEq/L — ABNORMAL HIGH (ref 3.5–5.1)
Potassium: 3.9 mEq/L (ref 3.5–5.1)
Potassium: 3.9 mEq/L (ref 3.5–5.1)
Sodium: 134 mEq/L — ABNORMAL LOW (ref 135–145)
Sodium: 136 mEq/L (ref 135–145)
Sodium: 137 mEq/L (ref 135–145)
Sodium: 138 mEq/L (ref 135–145)
Sodium: 138 mEq/L (ref 135–145)
Sodium: 138 meq/L (ref 135–145)
Sodium: 140 mEq/L (ref 135–145)
Sodium: 141 mEq/L (ref 135–145)
Sodium: 136 mEq/L (ref 135–145)
Sodium: 136 mEq/L (ref 135–145)

## 2010-05-30 LAB — CULTURE, BLOOD (ROUTINE X 2)
Culture: NO GROWTH
Culture: NO GROWTH
Culture: NO GROWTH

## 2010-05-30 LAB — BASIC METABOLIC PANEL
BUN: 10 mg/dL (ref 6–23)
BUN: 60 mg/dL — ABNORMAL HIGH (ref 6–23)
BUN: 78 mg/dL — ABNORMAL HIGH (ref 6–23)
CO2: 28 mEq/L (ref 19–32)
CO2: 25 mEq/L (ref 19–32)
Calcium: 8.9 mg/dL (ref 8.4–10.5)
Chloride: 98 mEq/L (ref 96–112)
Chloride: 96 mEq/L (ref 96–112)
Creatinine, Ser: 3.24 mg/dL — ABNORMAL HIGH (ref 0.4–1.2)
Creatinine, Ser: 5.33 mg/dL — ABNORMAL HIGH (ref 0.4–1.2)
GFR calc Af Amer: 9 mL/min — ABNORMAL LOW (ref >60)
GFR calc Af Amer: 6 mL/min — ABNORMAL LOW (ref >60)
GFR calc Af Amer: 5 mL/min — ABNORMAL LOW (ref >60)
GFR calc non Af Amer: 14 mL/min — ABNORMAL LOW (ref >60)
GFR calc non Af Amer: 8 mL/min — ABNORMAL LOW (ref >60)
GFR calc non Af Amer: 4 mL/min — ABNORMAL LOW (ref >60)
Glucose, Bld: 107 mg/dL — ABNORMAL HIGH (ref 70–99)
Glucose, Bld: 156 mg/dL — ABNORMAL HIGH (ref 70–99)
Potassium: 3.2 mEq/L — ABNORMAL LOW (ref 3.5–5.1)
Potassium: 6 mEq/L — ABNORMAL HIGH (ref 3.5–5.1)
Potassium: 4.5 mEq/L (ref 3.5–5.1)
Potassium: 5.6 mEq/L — ABNORMAL HIGH (ref 3.5–5.1)
Sodium: 140 mEq/L (ref 135–145)
Sodium: 136 mEq/L (ref 135–145)

## 2010-05-30 LAB — PROTIME-INR
INR: 1.51 — ABNORMAL HIGH (ref 0.00–1.49)
INR: 2.15 — ABNORMAL HIGH (ref 0.00–1.49)
INR: 1.64 — ABNORMAL HIGH (ref 0.00–1.49)
INR: 1.36 (ref 0.00–1.49)
INR: 1.49 (ref 0.00–1.49)
INR: 3.02 — ABNORMAL HIGH (ref 0.00–1.49)
INR: 9.81 (ref 0.00–1.49)
Prothrombin Time: 18.1 seconds — ABNORMAL HIGH (ref 11.6–15.2)
Prothrombin Time: 23.8 seconds — ABNORMAL HIGH (ref 11.6–15.2)
Prothrombin Time: 23.9 seconds — ABNORMAL HIGH (ref 11.6–15.2)
Prothrombin Time: 24 seconds — ABNORMAL HIGH (ref 11.6–15.2)
Prothrombin Time: 24.4 seconds — ABNORMAL HIGH (ref 11.6–15.2)
Prothrombin Time: 26.7 seconds — ABNORMAL HIGH (ref 11.6–15.2)
Prothrombin Time: 16.7 seconds — ABNORMAL HIGH (ref 11.6–15.2)
Prothrombin Time: 17.9 seconds — ABNORMAL HIGH (ref 11.6–15.2)
Prothrombin Time: 52.5 seconds — ABNORMAL HIGH (ref 11.6–15.2)
Prothrombin Time: 78 seconds — ABNORMAL HIGH (ref 11.6–15.2)

## 2010-05-30 LAB — DIFFERENTIAL
Basophils Absolute: 0 10*3/uL (ref 0.0–0.1)
Basophils Absolute: 0 10*3/uL (ref 0.0–0.1)
Basophils Relative: 0 % (ref 0–1)
Basophils Relative: 0 % (ref 0–1)
Basophils Relative: 0 % (ref 0–1)
Basophils Relative: 0 % (ref 0–1)
Eosinophils Absolute: 0 10*3/uL (ref 0.0–0.7)
Eosinophils Absolute: 0 10*3/uL (ref 0.0–0.7)
Eosinophils Absolute: 0 10*3/uL (ref 0.0–0.7)
Eosinophils Relative: 4 % (ref 0–5)
Eosinophils Relative: 0 % (ref 0–5)
Eosinophils Relative: 0 % (ref 0–5)
Eosinophils Relative: 1 % (ref 0–5)
Lymphocytes Relative: 14 % (ref 12–46)
Lymphocytes Relative: 10 % — ABNORMAL LOW (ref 12–46)
Lymphocytes Relative: 12 % (ref 12–46)
Lymphocytes Relative: 8 % — ABNORMAL LOW (ref 12–46)
Lymphs Abs: 0.8 10*3/uL (ref 0.7–4.0)
Lymphs Abs: 0.7 10*3/uL (ref 0.7–4.0)
Lymphs Abs: 1 10*3/uL (ref 0.7–4.0)
Lymphs Abs: 0.5 10*3/uL — ABNORMAL LOW (ref 0.7–4.0)
Lymphs Abs: 0.8 10*3/uL (ref 0.7–4.0)
Monocytes Absolute: 0.7 10*3/uL (ref 0.1–1.0)
Monocytes Absolute: 0.9 10*3/uL (ref 0.1–1.0)
Monocytes Absolute: 0.7 10*3/uL (ref 0.1–1.0)
Monocytes Absolute: 0.8 10*3/uL (ref 0.1–1.0)
Monocytes Relative: 11 % (ref 3–12)
Monocytes Relative: 10 % (ref 3–12)
Monocytes Relative: 12 % (ref 3–12)
Neutro Abs: 6 10*3/uL (ref 1.7–7.7)
Neutro Abs: 5.2 10*3/uL (ref 1.7–7.7)
Neutrophils Relative %: 75 % (ref 43–77)

## 2010-05-30 LAB — URINE MICROSCOPIC-ADD ON

## 2010-05-30 LAB — IRON AND TIBC
Iron: 27 ug/dL — ABNORMAL LOW (ref 42–135)
Iron: 29 ug/dL — ABNORMAL LOW (ref 42–135)
TIBC: 159 ug/dL — ABNORMAL LOW (ref 250–470)
UIBC: 130 ug/dL
UIBC: 177 ug/dL

## 2010-05-30 LAB — URINALYSIS, ROUTINE W REFLEX MICROSCOPIC
Glucose, UA: 250 mg/dL — AB
Ketones, ur: NEGATIVE mg/dL
Nitrite: POSITIVE — AB
Protein, ur: >300 mg/dL — AB
Specific Gravity, Urine: 1.02 (ref 1.005–1.030)
Urobilinogen, UA: 1 mg/dL (ref 0.0–1.0)
pH: 6.5 (ref 5.0–8.0)

## 2010-05-30 LAB — URINE CULTURE: Colony Count: 50000

## 2010-05-30 LAB — CK TOTAL AND CKMB (NOT AT ARMC): Relative Index: INVALID (ref 0.0–2.5)

## 2010-05-30 LAB — PTH, INTACT AND CALCIUM: PTH: 51.9 pg/mL (ref 14.0–72.0)

## 2010-05-30 LAB — LIPID PANEL
LDL Cholesterol: 50 mg/dL (ref 0–99)
Total CHOL/HDL Ratio: 2 RATIO
VLDL: 12 mg/dL (ref 0–40)

## 2010-05-30 LAB — POTASSIUM: Potassium: 5.9 mEq/L — ABNORMAL HIGH (ref 3.5–5.1)

## 2010-05-30 LAB — CROSSMATCH

## 2010-05-30 LAB — FERRITIN: Ferritin: 944 ng/mL — ABNORMAL HIGH (ref 10–291)

## 2010-05-30 LAB — ABO/RH: ABO/RH(D): O POS

## 2010-05-30 LAB — PREPARE FRESH FROZEN PLASMA

## 2010-05-30 LAB — CLOSTRIDIUM DIFFICILE EIA

## 2010-05-30 LAB — ACTH STIMULATION, 3 TIME POINTS
Cortisol, 60 Min: 29 ug/dL (ref >20)
Cortisol, Base: 10.8 ug/dL

## 2010-05-30 LAB — MRSA PCR SCREENING: MRSA by PCR: NEGATIVE

## 2010-05-30 LAB — APTT: aPTT: 28 seconds (ref 24–37)

## 2010-05-31 ENCOUNTER — Ambulatory Visit: Payer: Medicare Other | Admitting: Vascular Surgery

## 2010-06-07 ENCOUNTER — Ambulatory Visit (INDEPENDENT_AMBULATORY_CARE_PROVIDER_SITE_OTHER): Payer: Medicare Other | Admitting: Vascular Surgery

## 2010-06-07 DIAGNOSIS — T82898A Other specified complication of vascular prosthetic devices, implants and grafts, initial encounter: Secondary | ICD-10-CM

## 2010-06-07 DIAGNOSIS — N186 End stage renal disease: Secondary | ICD-10-CM

## 2010-06-08 NOTE — Assessment & Plan Note (Signed)
OFFICE VISIT  LILIANNA, CASE DOB:  1929-12-02                                       06/07/2010 [id number]#:[id number]  I saw the patient in the office today for evaluation for new access. She was referred by Dr. Darrick Penna.  This is a pleasant 77 year old woman who had removal of an infected left upper arm graft and is currently being dialyzed via a right IJ Diatek catheter.  The wounds in the left arm had healed and at this point she was ready for new access.  Of note, she dialyzes at [facility] on Tuesdays, Thursdays, and Saturdays.  She has end-stage renal disease secondary to hypertension and diabetes.  She has had no recent uremic symptoms.  Specifically, she denies any nausea, vomiting, fatigue, anorexia, palpitations, or shortness of breath.  PAST MEDICAL HISTORY:  Significant for a history of chronic atrial fibrillation.  She is on Coumadin.  She also has a history of stroke with left-sided deficit and contracture in the left upper extremity.  In addition, she has type 2 diabetes, hypertension, anemia of chronic disease, and also a history of heparin-induced thrombocytopenia.  REVIEW OF SYSTEMS:  Cardiovascular:  She has had no chest pain, chest pressure, palpitations, or arrhythmias.  Pulmonary:  She had no productive cough, bronchitis, asthma, or wheezing.  PHYSICAL EXAMINATION:  GENERAL:  This is a pleasant 77 year old woman who appears her stated age. VITAL SIGNS:  Blood pressure is 144/82, heart rate is 58, temperature 97.4. LUNGS:  Clear bilaterally to auscultation without rales, rhonchi, or wheezing. CARDIOVASCULAR:  She has a regular rate and rhythm.  She has a palpable brachial and radial pulse on the right side.  She has a cyst in her right wrist but she does have a pulse beneath that. ABDOMEN:  Soft and nontender with normal pitched bowel sounds. NEUROLOGIC:  She has significant left-sided weakness with contraction of the  left arm. SKIN:  There are no ulcers or rashes.  I have reviewed her previous vein mapping done in November which showed the forearm and upper arm cephalic vein are very small.  The basilic vein was marginal in size with diameter ranging from 0.2 to 0.30 cm.  I have recommended that we explore her right arm basilic vein.  If this is adequate, we will do a basilic vein transposition.  If this is not adequate, then we would place an AV graft.  We will have to hold her Coumadin preoperatively.  I have scheduled this on Tuesday, April 3rd, and she will go for dialysis later in the day after her procedure.    Di Kindle. Edilia Bo, M.D. Electronically Signed  CSD/MEDQ  D:  06/07/2010  T:  06/08/2010  Job:  [id number]  cc:   Fayrene Fearing L. Deterding, M.D.

## 2010-06-13 ENCOUNTER — Ambulatory Visit (HOSPITAL_COMMUNITY)
Admission: RE | Admit: 2010-06-13 | Discharge: 2010-06-13 | Disposition: A | Discharge status: Home / Self Care | Payer: Medicare Other | Source: Ambulatory Visit | Attending: Vascular Surgery | Admitting: Vascular Surgery

## 2010-06-13 DIAGNOSIS — E119 Type 2 diabetes mellitus without complications: Secondary | ICD-10-CM | POA: Insufficient documentation

## 2010-06-13 DIAGNOSIS — I4891 Unspecified atrial fibrillation: Secondary | ICD-10-CM | POA: Insufficient documentation

## 2010-06-13 DIAGNOSIS — N186 End stage renal disease: Secondary | ICD-10-CM | POA: Insufficient documentation

## 2010-06-13 DIAGNOSIS — I120 Hypertensive chronic kidney disease with stage 5 chronic kidney disease or end stage renal disease: Secondary | ICD-10-CM

## 2010-06-13 LAB — PROTIME-INR
INR: 1.14 (ref 0.00–1.49)
Prothrombin Time: 14.8 seconds (ref 11.6–15.2)

## 2010-06-13 LAB — POCT I-STAT 4, (NA,K, GLUC, HGB,HCT)
Glucose, Bld: 165 mg/dL — ABNORMAL HIGH (ref 70–99)
HCT: 51 % — ABNORMAL HIGH (ref 36.0–46.0)
Hemoglobin: 17.3 g/dL — ABNORMAL HIGH (ref 12.0–15.0)
Potassium: 4.7 mEq/L (ref 3.5–5.1)

## 2010-06-14 NOTE — Op Note (Signed)
Ebony [name], Ebony [name]             ACCOUNT NO.:  [id number]  MEDICAL RECORD NO.:  [id number]           PATIENT TYPE:  O  LOCATION:  [facility]SC                         FACILITY:  [facility]  PHYSICIAN:  Di Kindle. Edilia Bo, M.D.DATE OF BIRTH:  1929-07-19  DATE OF PROCEDURE:  06/13/2010 DATE OF DISCHARGE:  06/13/2010                              OPERATIVE REPORT   PREOPERATIVE DIAGNOSIS:  Chronic kidney disease, stage V.  POSTOPERATIVE DIAGNOSIS:  Chronic kidney disease, stage V.  PROCEDURE:  Placement of a new right forearm arteriovenous graft.  SURGEON:  Di Kindle. Edilia Bo, MD  ASSISTANT:  Della Goo, PA-C  ANESTHESIA:  General.  TECHNIQUE:  The patient was taken to the operating room and received a general anesthetic.  The right upper extremity was prepped and draped in usual sterile fashion.  I used a duplex scanner to interrogate the cephalic and basilic veins.  Preoperative mapping had shown the cephalic vein was very very small in both the forearm and the upper arm.  The basilic vein was marginal in size.  I interrogated the basilic vein which was marginal in size initially, however, in the mid upper arm it had multiple branches and became very small and then entering into the deep system in the upper arm a little fairly early.  I do not think it was adequate vein for basilic transposition, I therefore elected to place a right forearm AV graft.  Longitudinal incision was made over the brachial artery just above the antecubital level.  Here, the brachial artery and adjacent brachial vein were dissected free.  Using one distal counterincision, a 4-7 mm graft was tunneled in a looped fashion in the forearm with the arterial aspect of the graft along the ulnar aspect of the forearm.  The patient had a history of heparin-induced thrombocytopenia.  I therefore used Refludan and she received a bolus of 0.1 mg/kg.  After 3 minutes, the brachial artery was clamped  proximally and distally and a longitudinal arteriotomy was made.  The arterial end of the graft was cut to the appropriate length, spatulated and sewn end- to-side to the brachial artery using continuous 6-0 Prolene suture.  The graft was reportedly appropriate length for anastomosis to the brachial vein.  The vein was ligated distally and spatulated proximally.  It was irrigated up with heparinized saline and I took a 5-mm dilator.  The venous limb of the graft was cut to the appropriate length, spatulated and sewn end-to-end to the vein using two continuous 6-0 Prolene sutures.  On completion, there was an excellent thrill in the graft and a palpable radial pulse.  The Refludan was not reversed.  We did use Gelfoam and thrombin for topical hemostasis, there was excellent hemostasis.  The distal counterincision was closed with a deep layer of 3-0 Vicryl, the skin was closed with 4-0 Vicryl.  The antecubital incision was closed with a deep layer of 3-0 Vicryl and the skin closed with 4-0 Vicryl.  A sterile dressing was applied.  The patient tolerated the procedure well and was transferred to the recovery room in stable condition.  All needle and sponge counts were correct.  Di Kindle. Edilia Bo, M.D.     CSD/MEDQ  D:  06/13/2010  T:  06/14/2010  Job:  [id number]  Electronically Signed by Waverly Ferrari M.D. on 06/14/2010 12:30:06 PM

## 2010-07-19 ENCOUNTER — Ambulatory Visit (HOSPITAL_COMMUNITY): Payer: Medicare Other | Attending: Vascular Surgery

## 2010-07-20 ENCOUNTER — Other Ambulatory Visit: Payer: Self-pay | Admitting: Vascular Surgery

## 2010-07-20 ENCOUNTER — Ambulatory Visit (INDEPENDENT_AMBULATORY_CARE_PROVIDER_SITE_OTHER): Payer: Medicare Other

## 2010-07-20 DIAGNOSIS — T8189XA Other complications of procedures, not elsewhere classified, initial encounter: Secondary | ICD-10-CM

## 2010-07-21 ENCOUNTER — Ambulatory Visit
Admission: RE | Admit: 2010-07-21 | Discharge: 2010-07-21 | Disposition: A | Discharge status: Home / Self Care | Payer: Medicare Other | Source: Ambulatory Visit | Attending: Vascular Surgery | Admitting: Vascular Surgery

## 2010-07-21 DIAGNOSIS — T8189XA Other complications of procedures, not elsewhere classified, initial encounter: Secondary | ICD-10-CM

## 2010-07-21 NOTE — Assessment & Plan Note (Signed)
OFFICE VISIT  Ebony [name], Ebony [name] DOB:  1929-04-23                                       07/20/2010 [id number]#:[id number]  ATTENDING PHYSICIAN:  [name], MD.  Date of surgery was 06/13/2010.  The patient presents today for evaluation of drainage from previous site of graft removal in her left upper extremity.  The patient has end-stage renal disease and dialyzes on Tuesday, Thursday, Saturday.  She was evaluated at dialysis this morning and was noted to have some drainage in an incision from a previously removed graft site in her left upper arm.  The patient is not certain if she is receiving antibiotics at dialysis.  She denies fevers and chills.  She has had a stroke in the past and has very limited mobility of her left upper extremity.  The patient had an infected left upper arm AV graft removed in March of 2012.  She subsequently had a new graft placed in the right forearm in April of 2012.  The patient denies any pain in the left upper extremity. She states that she was told there was drainage from this site approximately a week ago.  PHYSICAL EXAM:  The patient is resting comfortably in a wheelchair.  The left arm is inspected.  There is no evidence of erythema, induration or fluctuance noted.  There is a punctate opening at the superior aspect of the most proximal incision in the left upper arm.  With manipulation there is a small amount of purulent like material expressed.  This area is nontender to the patient.  She is afebrile.  She has a 2+ left radial pulse.  Dr. Darrick Penna has evaluated the patient as well.  We will set up a CT scan of the left upper extremity to rule out any retained graft as well as abscess in the left arm.  The patient is not certain if she is receiving antibiotics on dialysis.  I have written a prescription for doxycycline 100 mg one p.o. b.i.d. #14 that the patient will take with her to dialysis on Saturday.  She is to  ask the provider who is present at dialysis whether or not she is receiving antibiotics.  If she is not getting antibiotics at dialysis she will have the nursing home fill her prescription of doxycycline and start it immediately.  If she is receiving antibiotics on dialysis she will not need to have the doxycycline filled.  The patient does have a history of MRSA.  Dr. Darrick Penna would like to the patient in 1 week to evaluate CT scan and discuss results with the patient.  Pecola Leisure, PA  Charles E. Fields, MD Electronically Signed  AY/MEDQ  D:  07/20/2010  T:  07/21/2010  Job:  [id number]

## 2010-07-24 ENCOUNTER — Other Ambulatory Visit: Payer: Self-pay | Admitting: Vascular Surgery

## 2010-07-24 ENCOUNTER — Ambulatory Visit (HOSPITAL_COMMUNITY): Payer: Medicare Other | Attending: Vascular Surgery

## 2010-07-24 DIAGNOSIS — Z452 Encounter for adjustment and management of vascular access device: Secondary | ICD-10-CM

## 2010-07-24 DIAGNOSIS — N186 End stage renal disease: Secondary | ICD-10-CM

## 2010-07-24 DIAGNOSIS — I120 Hypertensive chronic kidney disease with stage 5 chronic kidney disease or end stage renal disease: Secondary | ICD-10-CM

## 2010-07-24 LAB — PROTIME-INR
INR: 0.97 (ref 0.00–1.49)
Prothrombin Time: 13.1 seconds (ref 11.6–15.2)

## 2010-07-24 LAB — APTT: aPTT: 29 seconds (ref 24–37)

## 2010-07-25 ENCOUNTER — Other Ambulatory Visit (HOSPITAL_COMMUNITY): Payer: Self-pay | Admitting: Nephrology

## 2010-07-25 ENCOUNTER — Ambulatory Visit (HOSPITAL_COMMUNITY)
Admission: RE | Admit: 2010-07-25 | Discharge: 2010-07-25 | Disposition: A | Discharge status: Home / Self Care | Payer: Medicare Other | Source: Ambulatory Visit | Attending: Nephrology | Admitting: Nephrology

## 2010-07-25 DIAGNOSIS — I742 Embolism and thrombosis of arteries of the upper extremities: Secondary | ICD-10-CM

## 2010-07-25 DIAGNOSIS — N186 End stage renal disease: Secondary | ICD-10-CM | POA: Insufficient documentation

## 2010-07-25 DIAGNOSIS — I2510 Atherosclerotic heart disease of native coronary artery without angina pectoris: Secondary | ICD-10-CM | POA: Insufficient documentation

## 2010-07-25 DIAGNOSIS — Z8673 Personal history of transient ischemic attack (TIA), and cerebral infarction without residual deficits: Secondary | ICD-10-CM | POA: Insufficient documentation

## 2010-07-25 DIAGNOSIS — I120 Hypertensive chronic kidney disease with stage 5 chronic kidney disease or end stage renal disease: Secondary | ICD-10-CM | POA: Insufficient documentation

## 2010-07-25 DIAGNOSIS — T82898A Other specified complication of vascular prosthetic devices, implants and grafts, initial encounter: Secondary | ICD-10-CM | POA: Insufficient documentation

## 2010-07-25 DIAGNOSIS — Y849 Medical procedure, unspecified as the cause of abnormal reaction of the patient, or of later complication, without mention of misadventure at the time of the procedure: Secondary | ICD-10-CM | POA: Insufficient documentation

## 2010-07-25 DIAGNOSIS — E119 Type 2 diabetes mellitus without complications: Secondary | ICD-10-CM | POA: Insufficient documentation

## 2010-07-25 MED ORDER — IOHEXOL 300 MG/ML  SOLN
100.0000 mL | Freq: Once | INTRAMUSCULAR | Status: AC | PRN
  Administered 2010-07-25: 65 mL via INTRAVENOUS

## 2010-07-25 NOTE — Procedures (Signed)
CEPHALIC VEIN MAPPING   INDICATION:  Evaluation of right arm for new dialysis access fistula.   HISTORY:  ESRD, failed and removed the left upper extremity   EXAM:  The right cephalic vein is compressible.   Diameter measurements range from 0.18 to 0.09 cm   The right basilic vein is compressible.   Diameter measurements range from 0.35 to 0.11 cm   The left cephalic vein is not evaluated.   The left basilic vein is not evaluated.   See attached worksheet for all measurements.   IMPRESSION:  1. The patent right cephalic and basilic veins with diameter      measurements as described above.  2. There is a nonvascularized mass in the right antecubital fossa      measuring approximately 1.3 cm x 0.53 cm.   ___________________________________________  V. Charlena Cross, MD   LT/MEDQ  D:  02/06/2010  T:  02/06/2010  Job:  [id number]

## 2010-07-25 NOTE — Procedures (Signed)
VASCULAR LAB EXAM   INDICATION:  To rule out abscess.   HISTORY:  Left arteriovenous fistula graft.  Diabetes:  Yes.  Cardiac:  No.  Hypertension:  Yes.   EXAM:   IMPRESSION:  Patent left arteriovenous fistula graft with a stent at the  antecubital fossa.  No evidence of abscess.  A 4.7 x 2.2 cm hematoma  within the left antecubital fossa surrounding the AV graft.   Please note that discussed these findings with Dr. Myra Gianotti.   ___________________________________________  V. Charlena Cross, MD   OD/MEDQ  D:  01/10/2010  T:  01/11/2010  Job:  [id number]

## 2010-07-25 NOTE — Assessment & Plan Note (Signed)
OFFICE VISIT   Ebony [name], Ebony [name]  DOB:  1930/02/13                                       02/06/2010  [id number]#:[id number]   The patient comes back in today.  She has had removal of a left upper  arm graft for infection.  She is dialyzing through a right-sided  catheter.  I had her evaluated by ultrasound today to evaluate her  cephalic and basilic system on the right.  I do not think she is a  candidate for a new fistula on the right arm.  Since she is still  actively taking antibiotics and had a recently removed infected graft I  would like to give her another month before we consider a new access  which would be a right forearm graft.  She is going to follow up with me  in a month.     Jorge Ny, MD  Electronically Signed   VWB/MEDQ  D:  02/06/2010  T:  02/06/2010  Job:  [id number]   cc:   Dr Darrick Penna

## 2010-07-25 NOTE — Assessment & Plan Note (Signed)
OFFICE VISIT   Ebony [name], Ebony [name]  DOB:  04/25/1929                                       01/09/2010  [id number]#:[id number]   REASON FOR VISIT:  Rule out abscess around graft.   HISTORY:  Patient is a 77 year old female with end-stage renal disease  who has been on dialysis since 2008 but transferred care to [facility]  in 2010.  Her initial access was placed in [place].  She has been on  antibiotics for MRSA bacteremia.  That has not cleared.  There is  concern as to whether or not this coming from her graft.  She had an  occluded left upper arm graft that was treated by thrombolysis and  subsequent stent placement on 12/26/2009.  She comes in today for  further evaluation.   The patient has had a stroke with left-sided hemiparesis.   Past medical history is positive for anemia, atrial fibrillation on  chronic Coumadin, end-stage renal disease on dialysis, coronary artery  disease, dementia, diabetes, hypertension, MRSA bacteremia, history of  stroke.   SOCIAL HISTORY:  She is in the skilled nursing facility.  Denies alcohol  and tobacco.   FAMILY HISTORY:  Mother had diabetes.  Father had hypertension.   REVIEW OF SYSTEMS:  Negative x10, as documented in the encounter form.   PHYSICAL EXAMINATION:  Heart rate 64, blood pressure 172/83, respiratory  rate is 16.  General:  She is well in no acute distress, resting in her  wheelchair.  HEENT:  Within normal limits.  Respirations are nonlabored.  Cardiovascular:  She has a palpable pulse within a tortuous graft in her  left upper arm which is difficult to fully evaluate due to the  contracture from her stroke.  Her abdomen is soft.  Skin:  No rashes.  Neuro:  She has left-sided hemiparesis.  Skin:  The left upper arm has  an area of hardening in the upper medial aspect around the graft.  There  is no evidence of erythema.  There is no drainage.  The area is not  warm.  There is no evidence of cellulitis.   DIAGNOSTIC STUDIES:  I ultrasounded this area of concern.  There is a  small amount of fluid around the graft which is more consistent with  hematoma.  There is no gross ultrasonographic evidence for infection.   ASSESSMENT/PLAN:  The area of hardening around her graft does not appear  to be infected.  In a normal lady on chronic Coumadin therapy with  supratherapeutic levels and a recent percutaneous intervention, I think  the fluid seen on ultrasound most likely represents hematoma.  There is  no air in this area and no debris to suggest abscess.  In addition, the  area clinically does not appear infected, although I cannot 100% rule  out that the source of her persistent bacteremia as her graft, I do  think I would exhaust all other sources before addressing the graft.  If  all other sources are ruled out and the patient is persistently  bacteremic and everything points to the graft being the source, she  would require removal of her graft.  This will be somewhat difficult,  given the contractures in her arm, but I do think manageable.  Please  contact me should this need arise.     Jorge Ny, MD  Electronically Signed   VWB/MEDQ  D:  01/09/2010  T:  01/10/2010  Job:  [id number]   cc:   Fayrene Fearing L. Deterding, M.D.

## 2010-07-27 ENCOUNTER — Ambulatory Visit: Payer: Medicare Other | Admitting: Vascular Surgery

## 2010-08-10 ENCOUNTER — Ambulatory Visit (INDEPENDENT_AMBULATORY_CARE_PROVIDER_SITE_OTHER): Payer: Medicare Other | Admitting: Vascular Surgery

## 2010-08-10 DIAGNOSIS — N183 Chronic kidney disease, stage 3 unspecified: Secondary | ICD-10-CM

## 2010-08-10 DIAGNOSIS — T82898A Other specified complication of vascular prosthetic devices, implants and grafts, initial encounter: Secondary | ICD-10-CM

## 2010-08-11 NOTE — Assessment & Plan Note (Signed)
OFFICE VISIT  Ebony, [name] DOB:  [birth year]                                       08/10/2010 [id number]#:[id number]  HISTORY OF PRESENT ILLNESS:  The patient is a 77 year old dialysis patient who was previously seen by our PA, Lujean Rave, on Jul 20, 2010.  At that time, she was noted to have purulent drainage from her left upper arm.  The patient has a complicated access history with a previously placed left upper arm graft that had been stented in multiple locations.  A graft was previously removed by Dr. Myra Gianotti after it became infected.  The stents extended so far proximally that it was difficult to remove all of the stent material and all prosthetic was removed as much as practical during the time of the initial operation. It was noted that time that some prosthetic stent material was left behind.  The patient had drainage from that site for about a week prior to presenting to our office.  She was scheduled for a CT scan of the upper extremity to determine the exact extent on how far these stents extended up and was to return then for followup.  She missed her initial followup appointment on Jul 27, 2010.  She was then rescheduled and returns today for followup.  She still has had occasional purulent drainage from her left upper arm.  She denies any fever or chills.  PHYSICAL EXAMINATION:  Vital Signs:  Blood pressure 163/91, heart rate 94 and regular, temperature 97.5.  Left upper extremity:  There is a 2 mm opening in the upper portion of the axilla with yellowish purulent drainage extruding from it.  There is no surrounding erythema.  There is no obvious large abscess at this point.  The left arm is severely contracted at the elbow and shoulder.  She has a functioning right forearm graft.  I reviewed the CT scan of her left upper extremity today.  This shows the presence of the stent material which extends from the upper portion of the  humerus up into the distal axillary vein and potentially the proximal subclavian vein.  There is some fluid collection which extends to the skin and this does not appear to track all the way up past the stent material.  I believe the best option for Ebony [name] at this point would be an incision and drainage of her left upper arm for control of the active infection.  We will also try to remove as much of the remaining stent material as is practical to try to get the wound to heal completely. However, some of the stent material may be difficult to remove due to its very distal extent.  Hopefully, we will remove all prosthetic material though so that she does not have any further recurrent infections.  The procedure is scheduled by June 4th so that the operation avoids her dialysis day.  We will stop her Coumadin today.  Hopefully, this can be an I and D procedure that can be performed as an outpatient, but that will depend on the findings at the time of operation.  The patient will need a general anesthetic as her arm is contracted significantly and to maneuver her arm better in the operating room a general anesthetic will be necessary.    [name], MD Electronically Signed  CEF/MEDQ  D:  08/10/2010  T:  08/11/2010  Job:  [id number]  cc:   Fayrene Fearing L. Deterding, M.D.

## 2010-08-15 ENCOUNTER — Ambulatory Visit (HOSPITAL_COMMUNITY)
Admission: RE | Admit: 2010-08-15 | Discharge: 2010-08-15 | Disposition: A | Discharge status: Home / Self Care | Payer: Medicare Other | Source: Ambulatory Visit | Attending: Vascular Surgery | Admitting: Vascular Surgery

## 2010-08-15 DIAGNOSIS — T827XXA Infection and inflammatory reaction due to other cardiac and vascular devices, implants and grafts, initial encounter: Secondary | ICD-10-CM | POA: Insufficient documentation

## 2010-08-15 DIAGNOSIS — Z538 Procedure and treatment not carried out for other reasons: Secondary | ICD-10-CM | POA: Insufficient documentation

## 2010-08-15 DIAGNOSIS — Y832 Surgical operation with anastomosis, bypass or graft as the cause of abnormal reaction of the patient, or of later complication, without mention of misadventure at the time of the procedure: Secondary | ICD-10-CM | POA: Insufficient documentation

## 2010-08-15 DIAGNOSIS — Z01812 Encounter for preprocedural laboratory examination: Secondary | ICD-10-CM | POA: Insufficient documentation

## 2010-08-15 DIAGNOSIS — Z79899 Other long term (current) drug therapy: Secondary | ICD-10-CM | POA: Insufficient documentation

## 2010-08-15 LAB — BASIC METABOLIC PANEL
BUN: 22 mg/dL (ref 6–23)
CO2: 30 mEq/L (ref 19–32)
Calcium: 10.2 mg/dL (ref 8.4–10.5)
Chloride: 93 mEq/L — ABNORMAL LOW (ref 96–112)
GFR calc Af Amer: 11 mL/min — ABNORMAL LOW (ref >60)
GFR calc non Af Amer: 9 mL/min — ABNORMAL LOW (ref >60)
Glucose, Bld: 88 mg/dL (ref 70–99)
Potassium: 3.6 mEq/L (ref 3.5–5.1)

## 2010-08-15 LAB — CBC
MCH: 28.4 pg (ref 26.0–34.0)
MCV: 88.1 fL (ref 78.0–100.0)
Platelets: 148 10*3/uL — ABNORMAL LOW (ref 150–400)
RBC: 4.96 MIL/uL (ref 3.87–5.11)

## 2010-08-15 LAB — SURGICAL PCR SCREEN: Staphylococcus aureus: NEGATIVE

## 2010-08-22 ENCOUNTER — Inpatient Hospital Stay (HOSPITAL_COMMUNITY)
Admission: RE | Admit: 2010-08-22 | Discharge: 2010-08-25 | DRG: 252 | Disposition: A | Discharge status: Skilled Nursing Facility | Payer: Medicare Other | Source: Ambulatory Visit | Attending: Vascular Surgery | Admitting: Vascular Surgery

## 2010-08-22 DIAGNOSIS — Z794 Long term (current) use of insulin: Secondary | ICD-10-CM

## 2010-08-22 DIAGNOSIS — T82898A Other specified complication of vascular prosthetic devices, implants and grafts, initial encounter: Secondary | ICD-10-CM | POA: Diagnosis not present

## 2010-08-22 DIAGNOSIS — A4901 Methicillin susceptible Staphylococcus aureus infection, unspecified site: Secondary | ICD-10-CM | POA: Diagnosis present

## 2010-08-22 DIAGNOSIS — Y832 Surgical operation with anastomosis, bypass or graft as the cause of abnormal reaction of the patient, or of later complication, without mention of misadventure at the time of the procedure: Secondary | ICD-10-CM | POA: Diagnosis present

## 2010-08-22 DIAGNOSIS — N2581 Secondary hyperparathyroidism of renal origin: Secondary | ICD-10-CM | POA: Diagnosis present

## 2010-08-22 DIAGNOSIS — I120 Hypertensive chronic kidney disease with stage 5 chronic kidney disease or end stage renal disease: Secondary | ICD-10-CM

## 2010-08-22 DIAGNOSIS — IMO0002 Reserved for concepts with insufficient information to code with codable children: Secondary | ICD-10-CM | POA: Diagnosis present

## 2010-08-22 DIAGNOSIS — Y9210 Unspecified residential institution as the place of occurrence of the external cause: Secondary | ICD-10-CM | POA: Diagnosis not present

## 2010-08-22 DIAGNOSIS — Z79899 Other long term (current) drug therapy: Secondary | ICD-10-CM

## 2010-08-22 DIAGNOSIS — T827XXA Infection and inflammatory reaction due to other cardiac and vascular devices, implants and grafts, initial encounter: Secondary | ICD-10-CM

## 2010-08-22 DIAGNOSIS — Y849 Medical procedure, unspecified as the cause of abnormal reaction of the patient, or of later complication, without mention of misadventure at the time of the procedure: Secondary | ICD-10-CM | POA: Diagnosis not present

## 2010-08-22 DIAGNOSIS — Z992 Dependence on renal dialysis: Secondary | ICD-10-CM

## 2010-08-22 DIAGNOSIS — Z7901 Long term (current) use of anticoagulants: Secondary | ICD-10-CM

## 2010-08-22 DIAGNOSIS — D631 Anemia in chronic kidney disease: Secondary | ICD-10-CM | POA: Diagnosis present

## 2010-08-22 DIAGNOSIS — I4891 Unspecified atrial fibrillation: Secondary | ICD-10-CM | POA: Diagnosis present

## 2010-08-22 DIAGNOSIS — Z880 Allergy status to penicillin: Secondary | ICD-10-CM

## 2010-08-22 DIAGNOSIS — E119 Type 2 diabetes mellitus without complications: Secondary | ICD-10-CM | POA: Diagnosis present

## 2010-08-22 DIAGNOSIS — N186 End stage renal disease: Secondary | ICD-10-CM

## 2010-08-22 DIAGNOSIS — Z8614 Personal history of Methicillin resistant Staphylococcus aureus infection: Secondary | ICD-10-CM

## 2010-08-22 DIAGNOSIS — Z8673 Personal history of transient ischemic attack (TIA), and cerebral infarction without residual deficits: Secondary | ICD-10-CM

## 2010-08-22 DIAGNOSIS — Y92009 Unspecified place in unspecified non-institutional (private) residence as the place of occurrence of the external cause: Secondary | ICD-10-CM

## 2010-08-22 LAB — GLUCOSE, CAPILLARY
Glucose-Capillary: 89 mg/dL (ref 70–99)
Glucose-Capillary: 80 mg/dL (ref 70–99)

## 2010-08-22 LAB — PROTIME-INR: Prothrombin Time: 13 seconds (ref 11.6–15.2)

## 2010-08-22 LAB — APTT: aPTT: 32 seconds (ref 24–37)

## 2010-08-22 LAB — POCT I-STAT 4, (NA,K, GLUC, HGB,HCT): Hemoglobin: 12.6 g/dL (ref 12.0–15.0)

## 2010-08-23 ENCOUNTER — Inpatient Hospital Stay (HOSPITAL_COMMUNITY): Payer: Medicare Other

## 2010-08-23 LAB — CBC
HCT: 31.1 % — ABNORMAL LOW (ref 36.0–46.0)
Hemoglobin: 9.7 g/dL — ABNORMAL LOW (ref 12.0–15.0)
MCH: 27.6 pg (ref 26.0–34.0)
MCV: 88.6 fL (ref 78.0–100.0)
RBC: 3.51 MIL/uL — ABNORMAL LOW (ref 3.87–5.11)

## 2010-08-23 LAB — RENAL FUNCTION PANEL
Albumin: 2.9 g/dL — ABNORMAL LOW (ref 3.5–5.2)
BUN: 28 mg/dL — ABNORMAL HIGH (ref 6–23)
CO2: 30 mEq/L (ref 19–32)
Chloride: 99 mEq/L (ref 96–112)
Glucose, Bld: 139 mg/dL — ABNORMAL HIGH (ref 70–99)
Potassium: 4.8 mEq/L (ref 3.5–5.1)

## 2010-08-23 LAB — PROTIME-INR: Prothrombin Time: 14.9 seconds (ref 11.6–15.2)

## 2010-08-23 LAB — GLUCOSE, CAPILLARY: Glucose-Capillary: 200 mg/dL — ABNORMAL HIGH (ref 70–99)

## 2010-08-24 ENCOUNTER — Inpatient Hospital Stay (HOSPITAL_COMMUNITY): Payer: Medicare Other

## 2010-08-24 LAB — GLUCOSE, CAPILLARY
Glucose-Capillary: 125 mg/dL — ABNORMAL HIGH (ref 70–99)
Glucose-Capillary: 104 mg/dL — ABNORMAL HIGH (ref 70–99)

## 2010-08-25 LAB — WOUND CULTURE

## 2010-08-25 LAB — GLUCOSE, CAPILLARY
Glucose-Capillary: 157 mg/dL — ABNORMAL HIGH (ref 70–99)
Glucose-Capillary: 191 mg/dL — ABNORMAL HIGH (ref 70–99)

## 2010-08-25 LAB — PROTIME-INR: INR: 1.17 (ref 0.00–1.49)

## 2010-08-26 LAB — WOUND CULTURE: Gram Stain: NONE SEEN

## 2010-08-26 NOTE — Op Note (Signed)
Ebony [name], Ebony [name] NO.:  [id number]  MEDICAL RECORD NO.:  [id number]  LOCATION:  6734                         FACILITY:  [facility]  PHYSICIAN:  Fransisco Hertz, MD       DATE OF BIRTH:  1929/03/25  DATE OF PROCEDURE:  08/22/2010 DATE OF DISCHARGE:                              OPERATIVE REPORT   PROCEDURES: 1. Incision and drainage of a left upper arm abscess. 2. Removal of infected stent.  PREOPERATIVE DIAGNOSIS:  Left upper arm abscess.  POSTOPERATIVE DIAGNOSIS:  Left upper arm abscess.  SURGEON:  Fransisco Hertz, MD  ASSISTANT:  Newton Pigg, PA  ANESTHESIA:  General.  FINDINGS IN THIS CASE: 1. Stent in the upper arm in the middle of abscess cavity. 2. No obvious brachial artery and wound field.  SPECIMEN: 1. Anaerobic and aerobic cultures of the abscess cavity. 2. Culture and sensitivity was sent on the stent.  DISPOSITION:  The specimen was to the Pathology.  ESTIMATED BLOOD LOSS:  Minimal.  INDICATIONS:  This is a 77 year old patient with complicated access history.  She previously has had an infected left upper arm graft that required 2 different excisions of graft material.  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  She continues to drain pus from her left upper arm.  A CT scan was obtained.  This demonstrated what appeared to be a radio--opaque object in the middle of abscess cavity.  Subsequently, it was felt that she needed an incision and drainage and removal of whatever object was present in the abscess cavity.  She is aware of the risks of this procedure including bleeding, infection, possible injury to nerves, adjacent area of the abscess, and possible postoperative bleeding.  She is aware of these risks and agreed to proceed forward.  DESCRIPTION OF THE OPERATION:  After full informed written consent  was obtained from the patient, she was brought back to the operating room, placed supine upon the operating table.  Prior to induction, she had received a dose of IV antibiotics.  Then, after obtaining adequate anesthesia, she was prepped and draped in a standard fashion for a left arm abscess. Due to her contracture, a sterilely gloved assistant was necessary to help manipulate the arm throughout this portion of the prep.  We then had an additional assistant who was sterilely gowned and draped, she held the arm in a 90-degree abducted position.  This allowed the exposure of the area of the abscess.  I made an incision through this area and dissected down into the abscess cavity.  Using blunt dissection and electrocautery, this became evident after enlarging opening into the abscess cavity that this was a previously placed stent by Interventional Radiology.  In fact the radiopaque object in the abscess cavity was a stent.  We obtained anaerobic and aerobic cultures  from this abscess.  I opened up the cavity completely to fully visualize  it and evacuated all the pus and was able to easily extract a stent out of another stent.  It appears that the stent that was sitting in the  middle of the abscess cavity may have in fact been a covered stent.  I passed this specimen off the field for cultures and sensitivities.  There were struts still evident in the wound that were consistent with possibly a smart stent, this is a bare uncovered stent, so I dissected more proximally this stent, it did not appear to be infected and there was no frank abscess.  I resected part of this stent without any difficulties more proximally, so at a point where it did not appear to be infected.  Then, obtained a Pulsavac device and irrigated out this wound with 1000 mL of normal saline and then explored the wound for any bleeding points which were controlled with electrocautery and then interrogated the rest  of this wound.  There was no frank brachial artery present in the wound and I saw no pulsations.  Using a continuous Doppler, I listened for the arterial signature.  There were no structures within the wound that were consistent with a brachial artery signature.  At this point, the wound was packed with sterile wet-to-dry dressings.  We, at this point, then placed a sterile dry dressing on top of this wet-to-dry. The plan is to allow this wound to clean up over the next couple of days and then place a VAC dressing shortly after that.  There is no evidence of cellulitis on examination, so subsequently I do not think any antibiotics will be necessary.  The patient was allowed to awaken without any difficulties.  COMPLICATIONS:  None.  CONDITION:  Stable.     Fransisco Hertz, MD     BLC/MEDQ  D:  08/22/2010  T:  08/23/2010  Job:  [id number]  Electronically Signed by Leonides Sake MD on 08/26/2010 08:06:30 PM

## 2010-08-27 LAB — ANAEROBIC CULTURE: Gram Stain: NONE SEEN

## 2010-08-30 NOTE — Discharge Summary (Signed)
NAMEALAYJAH, Ebony [name] NO.:  [id number]  MEDICAL RECORD NO.:  [id number]  LOCATION:  6734                         FACILITY:  [facility]  PHYSICIAN:  Fransisco Hertz, MD       DATE OF BIRTH:  May 03, 1929  DATE OF ADMISSION:  08/22/2010 DATE OF DISCHARGE:  08/25/2010                        DISCHARGE SUMMARY - REFERRING   ADMISSION DIAGNOSIS:  Left upper arm abscess.  HISTORY OF PRESENT ILLNESS:  This is a 77 year old patient with complicated abscess history, previously has gotten infected left upper arm graft that required 2 different excisions of graft material.  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  She continued to drain pus from her left upper arm. CT scan was obtained and demonstrated what appeared to be a radiopaque optic in the middle of the abscess cavity.  Subsequently, it was felt that she needed an incision and drainage and removal of whatever object was present in the abscess cavity.  The patient is aware of the risks of the procedure including bleeding, infection, possible injury to nerves, adjacent areas of abscess, and possible postoperative bleeding.  The patient is aware of the risks and agrees to proceed forward.  HOSPITAL COURSE:  The patient was admitted to the hospital and taken to the operating room on August 22, 2010, where she underwent an I and D of the left upper arm abscess and removal of infected stent.  She was found to have no brachial artery involvement and there were aerobic and anaerobic cultures of the abscess as well as cultures and sensitivities of the stent.  She tolerated the procedure well and was transported to the recovery room in satisfactory condition.  By postoperative day #1, the dressings were changed and her wound was clean without any bleeding. She was transferred to the renal floor that day.   Otherwise, the patient has done quite well postoperatively.  DISCHARGE DIAGNOSIS: 1. Left upper arm abscess.     a.     Status post irrigation and debridement  of left upper arm      abscess and removal of infected stent on August 22, 2010. 2. End stage renal disease. 3. Anemia. 4. Diabetes. 5. Atrial fibrillation.     a.     The patient is on Coumadin. 6. History of methicillin-resistant Staphylococcus aureus bacteremia. 7. Hypertension.  DISCHARGE MEDICATIONS: 1. Bisacodyl 10 mg p.o. daily p.r.n. 2. Calcium acetate 667 mg 2 capsules t.i.d. with meals. 3. Colace 100 mg p.o. b.i.d. 4. Coumadin 7 mg p.o. daily.     a.     Physician at skilled nursing facility will manage Coumadin      and INR. 5. Cymbalta 20 mg p.o. q.a.m. 6. EMLA cream apply to graft site prior to dialysis 1 application     topically on Tuesday, Thursday, and Saturday. 7. Ferrous sulfate 325 mg p.o. b.i.d. 8. Lantus 5 units subcu daily nightly. 9. Metoprolol 50 mg p.o. b.i.d. on Monday, Wednesday, Friday, and     Sunday. 10.Metoprolol 50 mg on Tuesday, Thursday, and Saturday. 11.Nephro-Vite renal vitamin p.o. nightly. 12.Norvasc 5  mg p.o. nightly. 13.Omeprazole 20 mg p.o. q.a.m. 14.Percocet 5/325 one p.o. q.6 h. p.r.n. pain, #30, no refills. 15.Simvastatin 20 mg p.o. daily. 16.Trazodone 1/2 tablet p.o. nightly.  DISCHARGE INSTRUCTIONS:  Wound VAC is to be placed to the left upper arm wound once she goes to the skilled nursing facility and this is to be changed 3 times per week.  There is Staph aureus on the culture that was taken intraoperatively and Dr. Imogene Burn feels that I and D and wound care is treatment for this.  The patient does receive vancomycin at dialysis on Tuesday, Thursday, and Saturday as well.  FOLLOWUP:  The patient is to follow up with Dr. Imogene Burn in 1 week in his office visit.     Newton Pigg, PA   ______________________________ Fransisco Hertz, MD    SE/MEDQ  D:  08/25/2010   T:  08/25/2010  Job:  [id number]  Electronically Signed by Newton Pigg PA on 08/28/2010 03:38:09 PM Electronically Signed by Leonides Sake MD on 08/30/2010 09:03:23 AM

## 2010-09-06 NOTE — Progress Notes (Addendum)
[facility] [place]  Postoperative Visit  History of Present Illness  Ebony [name] is a 77 y.o. year old female who presents for postoperative follow-up for: L arm I&D with removal of infected stent (Date: 08/22/10).  The patient has been undergoing VAC dressings to L arm.  The patient's wound are healing.  The patient denies fever or chills or drainage.  Physical Examination  Filed Vitals:   09/08/10 1015  BP: 139/77  Pulse: 52  Temp: 97.4 F (36.3 C)   LUE: Left upper arm wound is healing with good granulation  Medical Decision Making  Ebony [name] is a 76 y.o. year old female who presents s/p  L arm I&D with removal of infected stent.  Thank you for allowing [place] to participate in this patient's care.  She will need to continue VAC dressing and follow in 4 weeks for re-evaluation of the wound.  Leonides Sake, MD [facility] [place] Office: [phone] Pager: [phone]

## 2010-09-08 ENCOUNTER — Encounter: Payer: Self-pay | Admitting: Vascular Surgery

## 2010-09-08 ENCOUNTER — Ambulatory Visit (INDEPENDENT_AMBULATORY_CARE_PROVIDER_SITE_OTHER): Payer: Medicare Other | Admitting: Vascular Surgery

## 2010-09-08 VITALS — BP 139/77 | HR 52 | Temp 97.4°F

## 2010-09-08 DIAGNOSIS — L02414 Cutaneous abscess of left upper limb: Secondary | ICD-10-CM

## 2010-09-08 DIAGNOSIS — IMO0002 Reserved for concepts with insufficient information to code with codable children: Secondary | ICD-10-CM

## 2010-09-08 NOTE — Progress Notes (Signed)
Postop left upper arm debridement and AVG removal on 08/22/10

## 2010-09-20 ENCOUNTER — Other Ambulatory Visit (HOSPITAL_COMMUNITY): Payer: Self-pay | Admitting: Nephrology

## 2010-09-20 DIAGNOSIS — T8249XA Other complication of vascular dialysis catheter, initial encounter: Secondary | ICD-10-CM

## 2010-09-20 DIAGNOSIS — N186 End stage renal disease: Secondary | ICD-10-CM

## 2010-09-27 ENCOUNTER — Ambulatory Visit (HOSPITAL_COMMUNITY)
Admission: RE | Admit: 2010-09-27 | Discharge: 2010-09-27 | Disposition: A | Discharge status: Home / Self Care | Payer: Medicare Other | Source: Ambulatory Visit | Attending: Nephrology | Admitting: Nephrology

## 2010-09-27 ENCOUNTER — Other Ambulatory Visit (HOSPITAL_COMMUNITY): Payer: Self-pay | Admitting: Nephrology

## 2010-09-27 DIAGNOSIS — D75829 Heparin-induced thrombocytopenia, unspecified: Secondary | ICD-10-CM | POA: Insufficient documentation

## 2010-09-27 DIAGNOSIS — T8249XA Other complication of vascular dialysis catheter, initial encounter: Secondary | ICD-10-CM

## 2010-09-27 DIAGNOSIS — T82898A Other specified complication of vascular prosthetic devices, implants and grafts, initial encounter: Secondary | ICD-10-CM | POA: Insufficient documentation

## 2010-09-27 DIAGNOSIS — Y832 Surgical operation with anastomosis, bypass or graft as the cause of abnormal reaction of the patient, or of later complication, without mention of misadventure at the time of the procedure: Secondary | ICD-10-CM | POA: Insufficient documentation

## 2010-09-27 DIAGNOSIS — D7582 Heparin induced thrombocytopenia (HIT): Secondary | ICD-10-CM | POA: Insufficient documentation

## 2010-09-27 DIAGNOSIS — N186 End stage renal disease: Secondary | ICD-10-CM | POA: Insufficient documentation

## 2010-09-27 LAB — POTASSIUM: Potassium: 5.7 mEq/L — ABNORMAL HIGH (ref 3.5–5.1)

## 2010-09-27 MED ORDER — IOHEXOL 300 MG/ML  SOLN
100.0000 mL | Freq: Once | INTRAMUSCULAR | Status: AC | PRN
  Administered 2010-09-27: 100 mL via INTRAVENOUS

## 2010-10-02 ENCOUNTER — Ambulatory Visit (HOSPITAL_COMMUNITY): Payer: Medicare Other

## 2010-10-03 NOTE — Progress Notes (Deleted)
[facility]   Postoperative Access Visit  History of Present Illness  Ebony [name] is a 77 y.o. year old female who presents for postoperative follow-up for: L arm I&D with removal of infected stent (Date: 08/22/10).  The patient's wounds are *** healed.  The patient notes *** steal symptoms.  The patient is *** able to complete their activities of daily living.  The patient's current symptoms are: ***.  Physical Examination  There were no vitals filed for this visit. LUE: Incision is *** healed, skin feels ***, hand grip is ***/5, sensation in digits is *** intact, ***palpable thrill, bruit can *** be auscultated   Medical Decision Making  Ebony [name] is a 77 y.o. year old female who presents s/p L arm I&D with removal of infected stent (Date: 08/22/10).  The patient's access will be *** ready for use in *** weeks.  The patient's tunneled dialysis catheter can be removed after two successful cannulations and completed dialysis treatments.  Thank you for allowing [place] to participate in this patient's care.  Leonides Sake, MD [facility] [place] Office: [phone] Pager: [phone]

## 2010-10-06 ENCOUNTER — Encounter: Payer: Medicare Other | Admitting: Vascular Surgery

## 2010-10-09 NOTE — Progress Notes (Signed)
This encounter was created in error - please disregard.

## 2010-10-13 ENCOUNTER — Ambulatory Visit (INDEPENDENT_AMBULATORY_CARE_PROVIDER_SITE_OTHER): Payer: Medicare Other | Admitting: Thoracic Diseases

## 2010-10-13 VITALS — HR 65 | Resp 14

## 2010-10-13 DIAGNOSIS — T8579XA Infection and inflammatory reaction due to other internal prosthetic devices, implants and grafts, initial encounter: Secondary | ICD-10-CM

## 2010-10-13 NOTE — Progress Notes (Signed)
[facility] [place]    HPI: CADINCE HILSCHER is a 77 y.o. female who is 8 weeks S/P removal infected stent from LUE by Dr Imogene Burn on 08/22/10 .  Pt. Has been doing well the wound has healed and she has had no further issues with swelling or drainage from the wound. The right forearm loop graft placed on 06/13/10 by Dr. Edilia Bo is well healed, with good thrill and bruit  Physical Examination  Filed Vitals:   10/13/10 1620  Pulse: 65  Resp: 14  Sats 98%  WDWN female in NAD. LUE: left upper extremity Incision is clean, dry, intact and well healed Skin color is normal   RUE:  right Hand grip is 5/5 and sensation in digits is intact; There is a good thrill and good bruit in the forearm AVGG.   Assessment/Plan RABIAH GOESER is a 77 y.o. year old female who presents s/p removal of infected stent from left upper arm. Follow-up as needed. Pt RUE access is already in use

## 2010-10-17 ENCOUNTER — Other Ambulatory Visit (HOSPITAL_COMMUNITY): Payer: Self-pay | Admitting: Nephrology

## 2010-10-17 ENCOUNTER — Ambulatory Visit (HOSPITAL_COMMUNITY)
Admission: RE | Admit: 2010-10-17 | Discharge: 2010-10-17 | Disposition: A | Discharge status: Home / Self Care | Payer: Medicare Other | Source: Ambulatory Visit | Attending: Nephrology | Admitting: Nephrology

## 2010-10-17 DIAGNOSIS — E119 Type 2 diabetes mellitus without complications: Secondary | ICD-10-CM | POA: Insufficient documentation

## 2010-10-17 DIAGNOSIS — T82898A Other specified complication of vascular prosthetic devices, implants and grafts, initial encounter: Secondary | ICD-10-CM | POA: Insufficient documentation

## 2010-10-17 DIAGNOSIS — Z8673 Personal history of transient ischemic attack (TIA), and cerebral infarction without residual deficits: Secondary | ICD-10-CM | POA: Insufficient documentation

## 2010-10-17 DIAGNOSIS — N186 End stage renal disease: Secondary | ICD-10-CM

## 2010-10-17 DIAGNOSIS — Z992 Dependence on renal dialysis: Secondary | ICD-10-CM | POA: Insufficient documentation

## 2010-10-17 DIAGNOSIS — I120 Hypertensive chronic kidney disease with stage 5 chronic kidney disease or end stage renal disease: Secondary | ICD-10-CM | POA: Insufficient documentation

## 2010-10-17 DIAGNOSIS — Y849 Medical procedure, unspecified as the cause of abnormal reaction of the patient, or of later complication, without mention of misadventure at the time of the procedure: Secondary | ICD-10-CM | POA: Insufficient documentation

## 2010-10-17 MED ORDER — IOHEXOL 300 MG/ML  SOLN
100.0000 mL | Freq: Once | INTRAMUSCULAR | Status: AC | PRN
  Administered 2010-10-17: 50 mL via INTRAVENOUS

## 2010-11-04 ENCOUNTER — Other Ambulatory Visit (HOSPITAL_COMMUNITY): Payer: Self-pay | Admitting: Nephrology

## 2010-11-04 ENCOUNTER — Ambulatory Visit (HOSPITAL_COMMUNITY)
Admission: RE | Admit: 2010-11-04 | Discharge: 2010-11-04 | Disposition: A | Discharge status: Home / Self Care | Payer: Medicare Other | Source: Ambulatory Visit | Attending: Nephrology | Admitting: Nephrology

## 2010-11-04 DIAGNOSIS — N186 End stage renal disease: Secondary | ICD-10-CM

## 2010-11-04 DIAGNOSIS — I4891 Unspecified atrial fibrillation: Secondary | ICD-10-CM | POA: Insufficient documentation

## 2010-11-04 DIAGNOSIS — T82898A Other specified complication of vascular prosthetic devices, implants and grafts, initial encounter: Secondary | ICD-10-CM | POA: Insufficient documentation

## 2010-11-04 DIAGNOSIS — I120 Hypertensive chronic kidney disease with stage 5 chronic kidney disease or end stage renal disease: Secondary | ICD-10-CM | POA: Insufficient documentation

## 2010-11-04 DIAGNOSIS — E119 Type 2 diabetes mellitus without complications: Secondary | ICD-10-CM | POA: Insufficient documentation

## 2010-11-04 DIAGNOSIS — Y849 Medical procedure, unspecified as the cause of abnormal reaction of the patient, or of later complication, without mention of misadventure at the time of the procedure: Secondary | ICD-10-CM | POA: Insufficient documentation

## 2010-11-04 LAB — POTASSIUM: Potassium: 4.9 mEq/L (ref 3.5–5.1)

## 2010-11-04 MED ORDER — IOHEXOL 300 MG/ML  SOLN
100.0000 mL | Freq: Once | INTRAMUSCULAR | Status: AC | PRN
  Administered 2010-11-04: 50 mL via INTRAVENOUS

## 2010-11-06 LAB — GLUCOSE, CAPILLARY: Glucose-Capillary: 113 mg/dL — ABNORMAL HIGH (ref 70–99)

## 2010-11-29 IMAGING — CT CT CHEST W/ CM
2 of 3 series · 16 of 31 positions shown, 19 images · IV contrast (80ml omni 300)
Comparison: Chest radiographs 11/02/2009 and earlier.

CLINICAL DATA: 79-year-old female with fever, hematemesis, edema.
Renal disease on dialysis.

CT CHEST WITH CONTRAST
TECHNIQUE: Multidetector CT imaging of the chest was performed
following the standard protocol during bolus administration of
intravenous contrast.
Contrast: 80 ml [id number]-[id number].

[Series 2: routine chest · axial · 0.67mm/px · z∈[-261,-16]mm · 10 of 61 slices shown, 13 images]
[im 6/61  mediastinal]
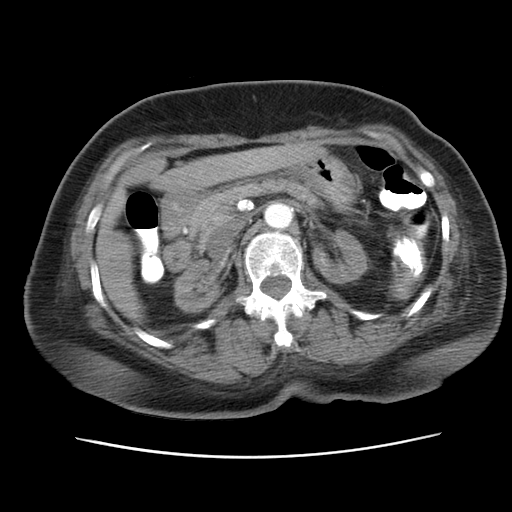
[im 6/61  lung]
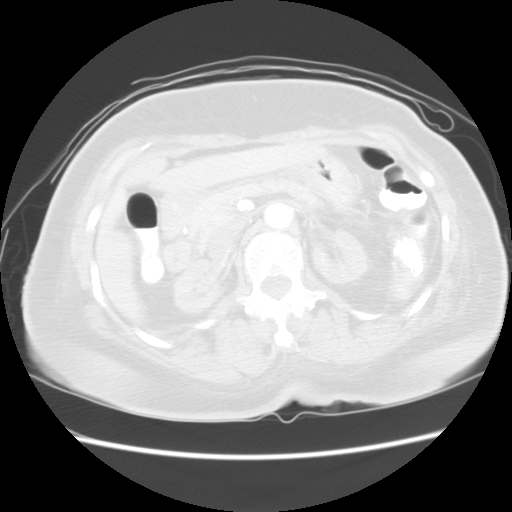
[im 11/61  lung]
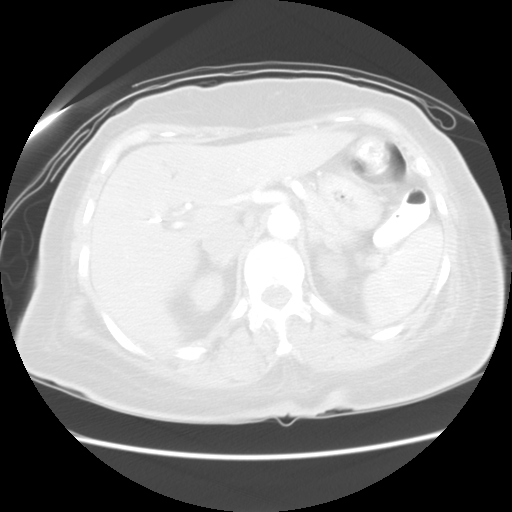
[im 17/61  lung]
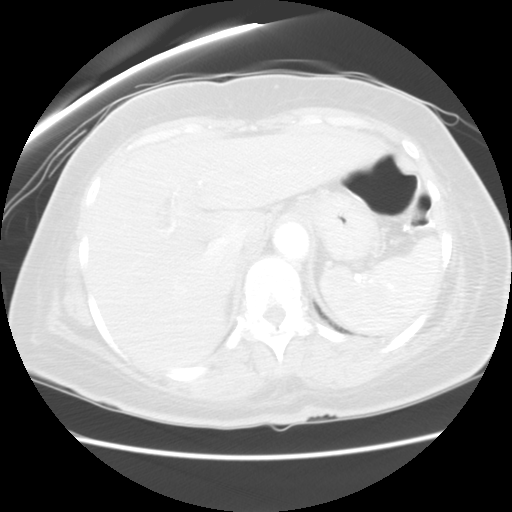
[im 22/61  lung]
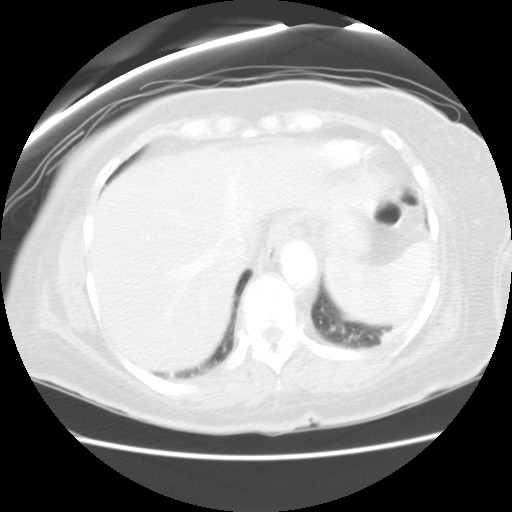
[im 28/61  mediastinal]
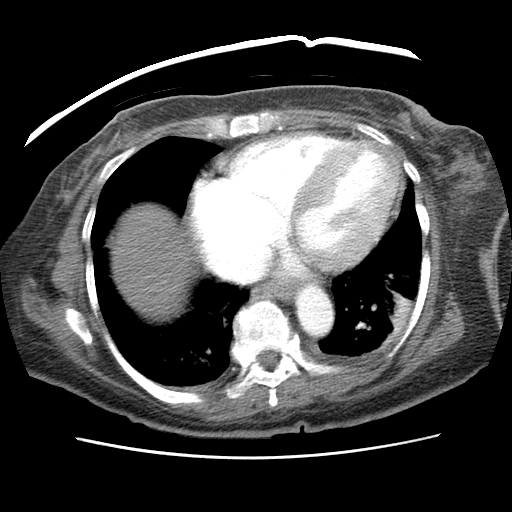
[im 28/61  lung]
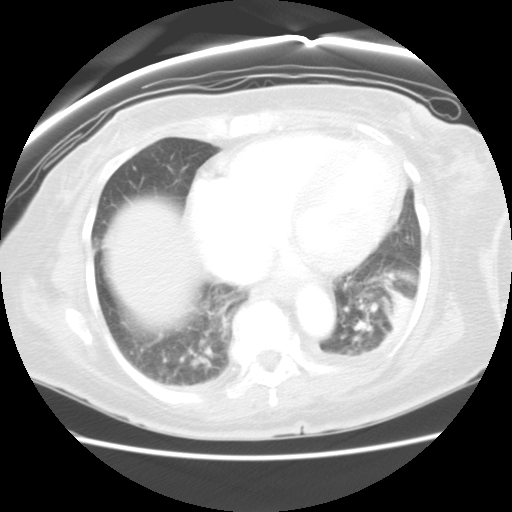
[im 33/61  lung]
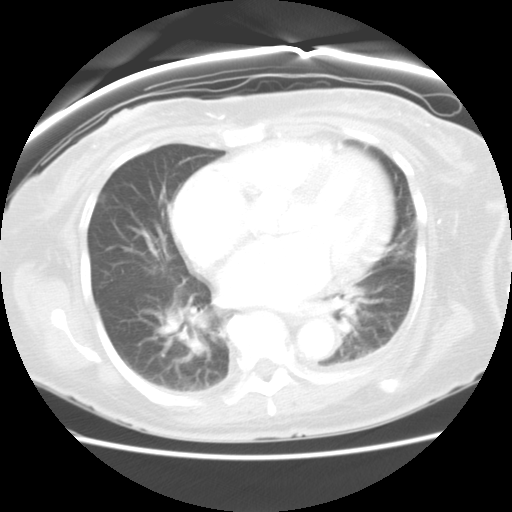
[im 39/61  lung]
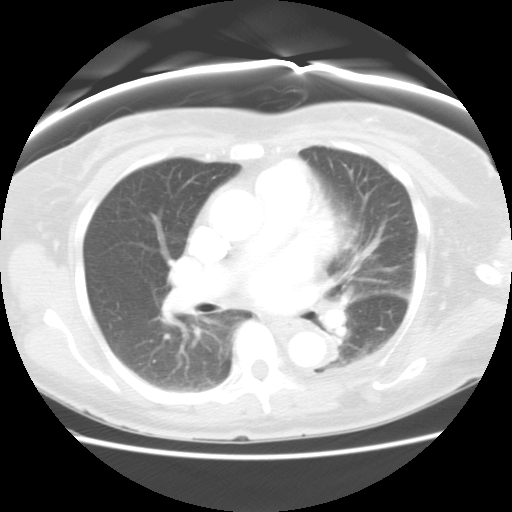
[im 44/61  lung]
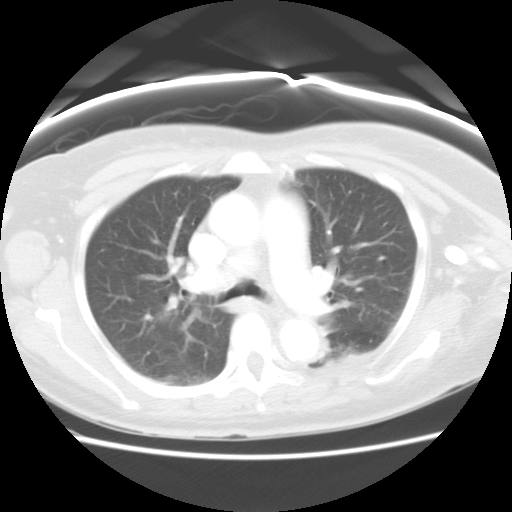
[im 50/61  mediastinal]
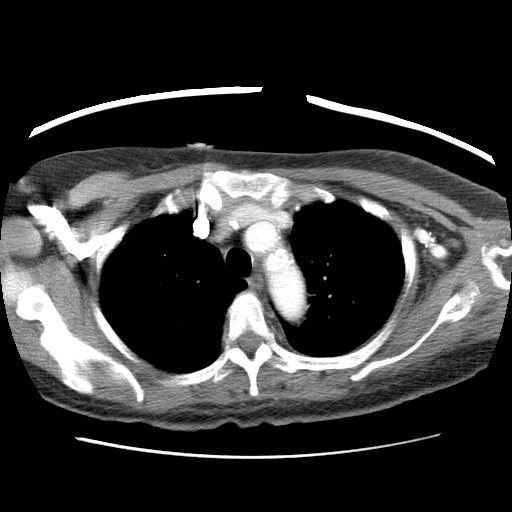
[im 50/61  lung]
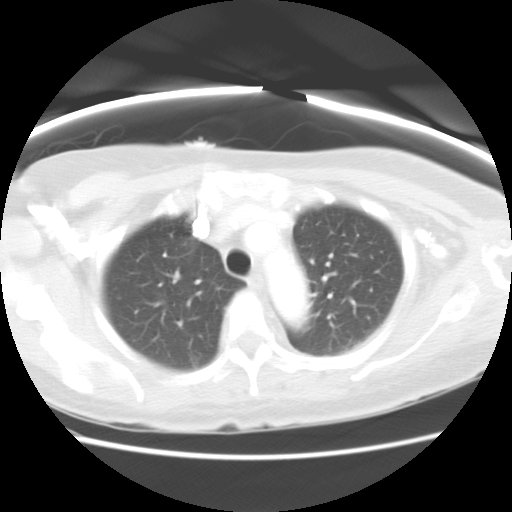
[im 55/61  lung]
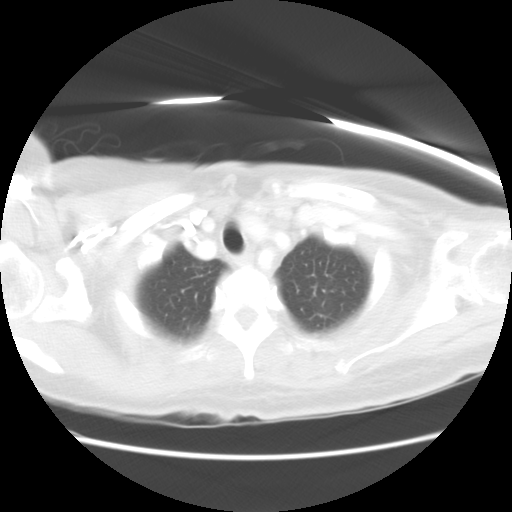

[Series 401: cor · coronal · 0.67mm/px · 6 of 78 slices shown]
[im 6/78  lung]
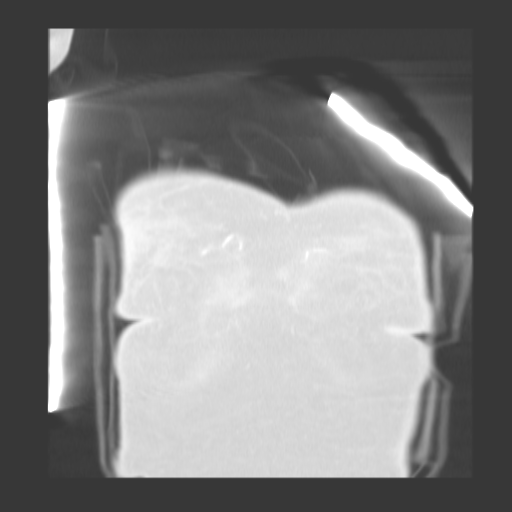
[im 16/78  lung]
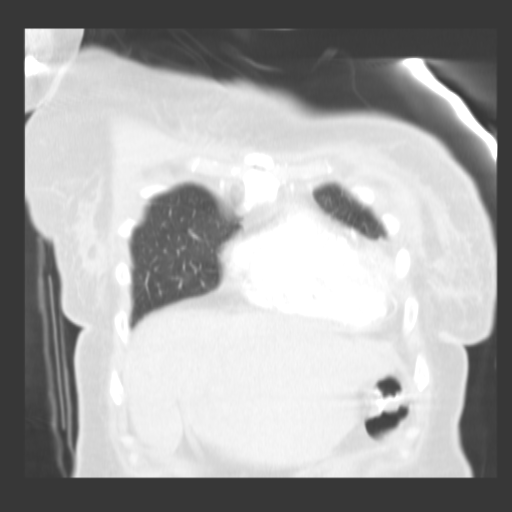
[im 26/78  lung]
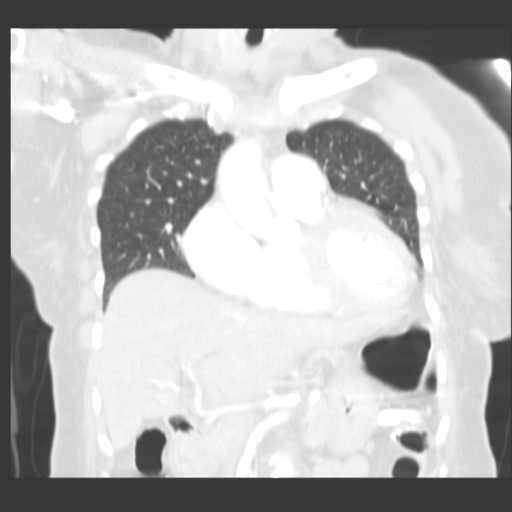
[im 36/78  lung]
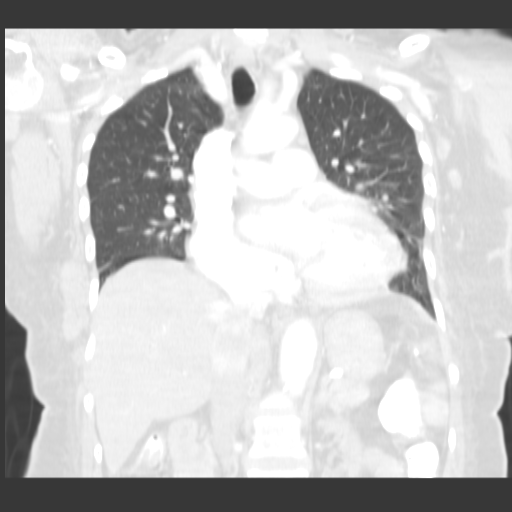
[im 42/78  lung]
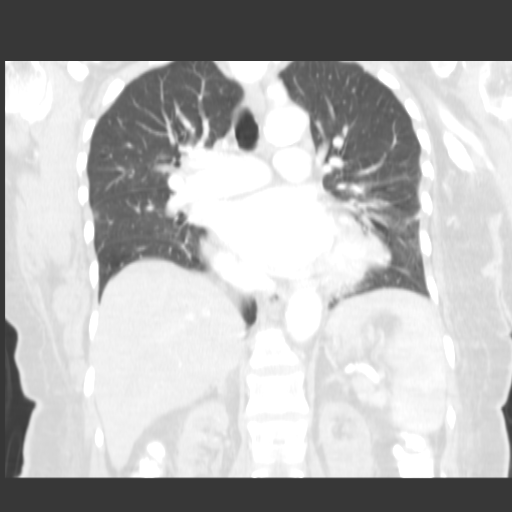
[im 52/78  lung]
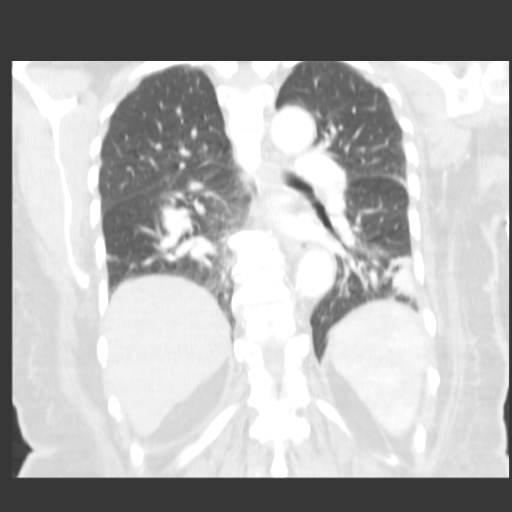

[16 of 31 positions shown; findings below may reference images not displayed]

FINDINGS: Hemodialysis related stent graft in the left axilla.
Gallbladder surgically absent.  Oral contrast in the visualized
colon.  Atrophied kidneys.  Vascular calcifications diffusely.
Other visualized upper abdominal viscera are within normal limits.

Cardiomegaly.  No pericardial effusion.  Trace left pleural
effusion.  No mediastinal lymphadenopathy.  Tortuous great vessels.
Bilateral hypodense thyroid nodules, 14 mm on the right and 9 mm on
the left.

Diffuse changes in bone mineralization may reflect renal
osteodystrophy.  There is mild enlargement of the T8 vertebral body
with coarsened trabecula and cortical thickening. No acute osseous
abnormality identified.

Major airways are patent.  Left greater than right dependent and
lower lobe atelectasis.  No airspace consolidation.
IMPRESSION: 1.  Cardiomegaly with trace left pleural effusion and left greater
than right atelectasis.
2.  Renal atrophy.  Atherosclerosis.
3.  T8 vertebral body appearance may reflect Paget's disease.
4.  Small thyroid nodules of doubtful clinical significance in this
age.

## 2010-12-12 ENCOUNTER — Other Ambulatory Visit (HOSPITAL_COMMUNITY): Payer: Self-pay | Admitting: Nephrology

## 2010-12-12 DIAGNOSIS — N186 End stage renal disease: Secondary | ICD-10-CM

## 2010-12-13 ENCOUNTER — Ambulatory Visit (HOSPITAL_COMMUNITY): Admission: RE | Admit: 2010-12-13 | Payer: Medicare Other | Source: Ambulatory Visit

## 2010-12-13 ENCOUNTER — Ambulatory Visit (HOSPITAL_COMMUNITY)
Admission: RE | Admit: 2010-12-13 | Discharge: 2010-12-13 | Disposition: A | Discharge status: Home / Self Care | Payer: Medicare Other | Source: Ambulatory Visit | Attending: Nephrology | Admitting: Nephrology

## 2010-12-13 ENCOUNTER — Other Ambulatory Visit (HOSPITAL_COMMUNITY): Payer: Self-pay | Admitting: Nephrology

## 2010-12-13 DIAGNOSIS — Y831 Surgical operation with implant of artificial internal device as the cause of abnormal reaction of the patient, or of later complication, without mention of misadventure at the time of the procedure: Secondary | ICD-10-CM | POA: Insufficient documentation

## 2010-12-13 DIAGNOSIS — I120 Hypertensive chronic kidney disease with stage 5 chronic kidney disease or end stage renal disease: Secondary | ICD-10-CM | POA: Insufficient documentation

## 2010-12-13 DIAGNOSIS — F0390 Unspecified dementia without behavioral disturbance: Secondary | ICD-10-CM | POA: Insufficient documentation

## 2010-12-13 DIAGNOSIS — N2581 Secondary hyperparathyroidism of renal origin: Secondary | ICD-10-CM | POA: Insufficient documentation

## 2010-12-13 DIAGNOSIS — N186 End stage renal disease: Secondary | ICD-10-CM | POA: Insufficient documentation

## 2010-12-13 DIAGNOSIS — I871 Compression of vein: Secondary | ICD-10-CM | POA: Insufficient documentation

## 2010-12-13 DIAGNOSIS — T82898A Other specified complication of vascular prosthetic devices, implants and grafts, initial encounter: Secondary | ICD-10-CM | POA: Insufficient documentation

## 2010-12-13 DIAGNOSIS — E119 Type 2 diabetes mellitus without complications: Secondary | ICD-10-CM | POA: Insufficient documentation

## 2010-12-13 LAB — POTASSIUM: Potassium: 4.7 mEq/L (ref 3.5–5.1)

## 2010-12-13 MED ORDER — IOHEXOL 300 MG/ML  SOLN
100.0000 mL | Freq: Once | INTRAMUSCULAR | Status: AC | PRN
  Administered 2010-12-13: 40 mL via INTRAVENOUS

## 2011-01-11 ENCOUNTER — Inpatient Hospital Stay (HOSPITAL_COMMUNITY)
Admission: RE | Admit: 2011-01-11 | Discharge: 2011-01-17 | DRG: 252 | Disposition: A | Discharge status: Skilled Nursing Facility | Payer: Medicare Other | Source: Ambulatory Visit | Attending: Surgery | Admitting: Surgery

## 2011-01-11 ENCOUNTER — Ambulatory Visit (HOSPITAL_COMMUNITY): Payer: Medicare Other

## 2011-01-11 DIAGNOSIS — I4891 Unspecified atrial fibrillation: Secondary | ICD-10-CM | POA: Diagnosis present

## 2011-01-11 DIAGNOSIS — E119 Type 2 diabetes mellitus without complications: Secondary | ICD-10-CM

## 2011-01-11 DIAGNOSIS — N186 End stage renal disease: Secondary | ICD-10-CM | POA: Diagnosis present

## 2011-01-11 DIAGNOSIS — T827XXA Infection and inflammatory reaction due to other cardiac and vascular devices, implants and grafts, initial encounter: Secondary | ICD-10-CM

## 2011-01-11 DIAGNOSIS — R791 Abnormal coagulation profile: Secondary | ICD-10-CM | POA: Diagnosis present

## 2011-01-11 DIAGNOSIS — R7881 Bacteremia: Secondary | ICD-10-CM | POA: Diagnosis present

## 2011-01-11 DIAGNOSIS — F0390 Unspecified dementia without behavioral disturbance: Secondary | ICD-10-CM | POA: Diagnosis present

## 2011-01-11 DIAGNOSIS — I2510 Atherosclerotic heart disease of native coronary artery without angina pectoris: Secondary | ICD-10-CM | POA: Diagnosis present

## 2011-01-11 DIAGNOSIS — T82898A Other specified complication of vascular prosthetic devices, implants and grafts, initial encounter: Secondary | ICD-10-CM | POA: Diagnosis present

## 2011-01-11 DIAGNOSIS — Y832 Surgical operation with anastomosis, bypass or graft as the cause of abnormal reaction of the patient, or of later complication, without mention of misadventure at the time of the procedure: Secondary | ICD-10-CM | POA: Diagnosis present

## 2011-01-11 DIAGNOSIS — I69959 Hemiplegia and hemiparesis following unspecified cerebrovascular disease affecting unspecified side: Secondary | ICD-10-CM

## 2011-01-11 DIAGNOSIS — I120 Hypertensive chronic kidney disease with stage 5 chronic kidney disease or end stage renal disease: Secondary | ICD-10-CM | POA: Diagnosis present

## 2011-01-11 DIAGNOSIS — Z7901 Long term (current) use of anticoagulants: Secondary | ICD-10-CM

## 2011-01-11 DIAGNOSIS — Z992 Dependence on renal dialysis: Secondary | ICD-10-CM

## 2011-01-11 DIAGNOSIS — Z5181 Encounter for therapeutic drug level monitoring: Secondary | ICD-10-CM

## 2011-01-11 DIAGNOSIS — D62 Acute posthemorrhagic anemia: Secondary | ICD-10-CM | POA: Diagnosis not present

## 2011-01-11 DIAGNOSIS — A4902 Methicillin resistant Staphylococcus aureus infection, unspecified site: Secondary | ICD-10-CM | POA: Diagnosis present

## 2011-01-11 DIAGNOSIS — N2581 Secondary hyperparathyroidism of renal origin: Secondary | ICD-10-CM | POA: Diagnosis present

## 2011-01-11 LAB — BASIC METABOLIC PANEL
BUN: 39 mg/dL — ABNORMAL HIGH (ref 6–23)
Calcium: 9.7 mg/dL (ref 8.4–10.5)
GFR calc non Af Amer: 5 mL/min — ABNORMAL LOW (ref >90)
Glucose, Bld: 178 mg/dL — ABNORMAL HIGH (ref 70–99)

## 2011-01-11 LAB — POCT I-STAT 4, (NA,K, GLUC, HGB,HCT)
HCT: 36 % (ref 36.0–46.0)
Hemoglobin: 12.2 g/dL (ref 12.0–15.0)

## 2011-01-11 LAB — SURGICAL PCR SCREEN
MRSA, PCR: NEGATIVE
Staphylococcus aureus: NEGATIVE

## 2011-01-11 LAB — CBC
MCH: 28.2 pg (ref 26.0–34.0)
MCHC: 32.5 g/dL (ref 30.0–36.0)
MCV: 86.8 fL (ref 78.0–100.0)
Platelets: 153 10*3/uL (ref 150–400)
RDW: 16.1 % — ABNORMAL HIGH (ref 11.5–15.5)

## 2011-01-11 LAB — GLUCOSE, CAPILLARY: Glucose-Capillary: 126 mg/dL — ABNORMAL HIGH (ref 70–99)

## 2011-01-11 LAB — APTT: aPTT: 32 seconds (ref 24–37)

## 2011-01-12 ENCOUNTER — Inpatient Hospital Stay (HOSPITAL_COMMUNITY): Payer: Medicare Other

## 2011-01-12 LAB — PROTIME-INR
INR: 1.66 — ABNORMAL HIGH (ref 0.00–1.49)
Prothrombin Time: 19.9 seconds — ABNORMAL HIGH (ref 11.6–15.2)

## 2011-01-12 LAB — GLUCOSE, CAPILLARY: Glucose-Capillary: 150 mg/dL — ABNORMAL HIGH (ref 70–99)

## 2011-01-12 LAB — POCT I-STAT 4, (NA,K, GLUC, HGB,HCT)
HCT: 32 % — ABNORMAL LOW (ref 36.0–46.0)
Sodium: 135 mEq/L (ref 135–145)

## 2011-01-12 MED ORDER — IOHEXOL 300 MG/ML  SOLN
50.0000 mL | Freq: Once | INTRAMUSCULAR | Status: AC | PRN
Start: 2011-01-12 — End: 2011-01-12
  Administered 2011-01-12: 10 mL via INTRAVENOUS

## 2011-01-13 ENCOUNTER — Inpatient Hospital Stay (HOSPITAL_COMMUNITY): Payer: Medicare Other

## 2011-01-13 LAB — PREPARE FRESH FROZEN PLASMA
Unit division: 0
Unit division: 0

## 2011-01-13 LAB — COMPREHENSIVE METABOLIC PANEL
Alkaline Phosphatase: 128 U/L — ABNORMAL HIGH (ref 39–117)
BUN: 49 mg/dL — ABNORMAL HIGH (ref 6–23)
Chloride: 96 mEq/L (ref 96–112)
GFR calc Af Amer: 4 mL/min — ABNORMAL LOW (ref >90)
GFR calc non Af Amer: 4 mL/min — ABNORMAL LOW (ref >90)
Glucose, Bld: 167 mg/dL — ABNORMAL HIGH (ref 70–99)
Potassium: 4.8 mEq/L (ref 3.5–5.1)
Total Bilirubin: 0.3 mg/dL (ref 0.3–1.2)

## 2011-01-13 LAB — PHOSPHORUS: Phosphorus: 4.4 mg/dL (ref 2.3–4.6)

## 2011-01-13 LAB — IRON AND TIBC
Iron: 67 ug/dL (ref 42–135)
Saturation Ratios: 36 % (ref 20–55)
TIBC: 187 ug/dL — ABNORMAL LOW (ref 250–470)
UIBC: 120 ug/dL — ABNORMAL LOW (ref 125–400)

## 2011-01-13 LAB — CBC
HCT: 26.9 % — ABNORMAL LOW (ref 36.0–46.0)
Hemoglobin: 8.9 g/dL — ABNORMAL LOW (ref 12.0–15.0)
WBC: 5.2 10*3/uL (ref 4.0–10.5)

## 2011-01-13 LAB — GLUCOSE, CAPILLARY

## 2011-01-13 LAB — APTT: aPTT: 40 seconds — ABNORMAL HIGH (ref 24–37)

## 2011-01-13 MED ORDER — DIPHENHYDRAMINE HCL 25 MG PO CAPS: 25.0000 mg | ORAL_CAPSULE | Freq: Four times a day (QID) | ORAL | Status: DC | PRN

## 2011-01-13 MED ORDER — ACETAMINOPHEN 650 MG RE SUPP: 325.0000 mg | RECTAL | Status: DC | PRN

## 2011-01-13 MED ORDER — CAMPHOR-MENTHOL 0.5-0.5 % EX LOTN: TOPICAL_LOTION | CUTANEOUS | Status: DC | PRN

## 2011-01-13 MED ORDER — TRAMADOL HCL 50 MG PO TABS
50.0000 mg | ORAL_TABLET | Freq: Four times a day (QID) | ORAL | Status: DC | PRN
  Filled 2011-01-13: qty 2

## 2011-01-13 MED ORDER — PROMETHAZINE HCL 25 MG/ML IJ SOLN: 6.2500 mg | INTRAMUSCULAR | Status: DC | PRN

## 2011-01-13 MED ORDER — PANTOPRAZOLE SODIUM 40 MG PO TBEC
40.0000 mg | DELAYED_RELEASE_TABLET | Freq: Every day | ORAL | Status: DC
  Administered 2011-01-14 – 2011-01-17 (×5): 40 mg via ORAL
  Filled 2011-01-13 (×3): qty 1

## 2011-01-13 MED ORDER — INSULIN ASPART 100 UNIT/ML ~~LOC~~ SOLN: 0.0000 [IU] | Freq: Three times a day (TID) | SUBCUTANEOUS | Status: DC

## 2011-01-13 MED ORDER — OXYCODONE HCL 5 MG PO TABS
5.0000 mg | ORAL_TABLET | ORAL | Status: DC | PRN
  Administered 2011-01-14: 10 mg via ORAL
  Filled 2011-01-13: qty 2

## 2011-01-13 MED ORDER — PROMETHAZINE HCL 25 MG PO TABS: 12.5000 mg | ORAL_TABLET | Freq: Four times a day (QID) | ORAL | Status: DC | PRN

## 2011-01-13 MED ORDER — SODIUM CHLORIDE 0.9 % IJ SOLN
3.0000 mL | Freq: Two times a day (BID) | INTRAMUSCULAR | Status: DC
  Administered 2011-01-15 – 2011-01-16 (×2): 3 mL via INTRAVENOUS
  Filled 2011-01-13 (×4): qty 3

## 2011-01-13 MED ORDER — LABETALOL HCL 5 MG/ML IV SOLN
10.0000 mg | INTRAVENOUS | Status: DC | PRN
  Filled 2011-01-13: qty 4

## 2011-01-13 MED ORDER — DOCUSATE SODIUM 283 MG RE ENEM
1.0000 | ENEMA | RECTAL | Status: DC | PRN
  Filled 2011-01-13: qty 1

## 2011-01-13 MED ORDER — MORPHINE SULFATE 2 MG/ML IJ SOLN: 2.0000 mg | INTRAMUSCULAR | Status: DC | PRN

## 2011-01-13 MED ORDER — ZOLPIDEM TARTRATE 5 MG PO TABS: 5.0000 mg | ORAL_TABLET | Freq: Every evening | ORAL | Status: DC | PRN

## 2011-01-13 MED ORDER — PROMETHAZINE HCL 25 MG RE SUPP: 25.0000 mg | Freq: Two times a day (BID) | RECTAL | Status: DC | PRN

## 2011-01-13 MED ORDER — HYDRALAZINE HCL 20 MG/ML IJ SOLN
10.0000 mg | INTRAMUSCULAR | Status: DC | PRN
  Filled 2011-01-13: qty 0.5

## 2011-01-13 MED ORDER — MAGNESIUM HYDROXIDE 400 MG/5ML PO SUSP: 30.0000 mL | ORAL | Status: DC | PRN

## 2011-01-13 MED ORDER — CALCIUM CARBONATE 1250 MG/5ML PO SUSP
500.0000 mg | Freq: Four times a day (QID) | ORAL | Status: DC | PRN
  Filled 2011-01-13: qty 5

## 2011-01-13 MED ORDER — ACETAMINOPHEN 325 MG PO TABS: 650.0000 mg | ORAL_TABLET | ORAL | Status: DC | PRN

## 2011-01-13 MED ORDER — POLYETHYLENE GLYCOL 3350 17 G PO PACK
17.0000 g | PACK | Freq: Every day | ORAL | Status: DC | PRN
  Filled 2011-01-13: qty 1

## 2011-01-13 MED ORDER — ALUM & MAG HYDROXIDE-SIMETH 200-200-20 MG/5ML PO SUSP
15.0000 mL | ORAL | Status: DC | PRN
  Filled 2011-01-13: qty 30

## 2011-01-13 MED ORDER — ONDANSETRON HCL 4 MG/2ML IJ SOLN: 4.0000 mg | Freq: Four times a day (QID) | INTRAMUSCULAR | Status: DC | PRN

## 2011-01-13 MED ORDER — GUAIFENESIN-DM 100-10 MG/5ML PO SYRP
15.0000 mL | ORAL_SOLUTION | ORAL | Status: DC | PRN
  Filled 2011-01-13: qty 15

## 2011-01-13 MED ORDER — AMLODIPINE BESYLATE 10 MG PO TABS
10.0000 mg | ORAL_TABLET | Freq: Every day | ORAL | Status: DC
  Administered 2011-01-14 – 2011-01-16 (×3): 10 mg via ORAL
  Filled 2011-01-13 (×5): qty 1

## 2011-01-13 MED ORDER — DARBEPOETIN ALFA-POLYSORBATE 200 MCG/0.4ML IJ SOLN
200.0000 ug | INTRAMUSCULAR | Status: DC
  Filled 2011-01-13: qty 0.4

## 2011-01-13 MED ORDER — RENA-VITE PO TABS
1.0000 | ORAL_TABLET | Freq: Every day | ORAL | Status: DC
  Administered 2011-01-14 – 2011-01-16 (×3): 1 via ORAL
  Filled 2011-01-13 (×5): qty 1

## 2011-01-13 MED ORDER — BISACODYL 10 MG RE SUPP: 10.0000 mg | Freq: Every day | RECTAL | Status: DC | PRN

## 2011-01-13 MED ORDER — TRAZODONE 25 MG HALF TABLET
25.0000 mg | ORAL_TABLET | Freq: Every day | ORAL | Status: DC
  Administered 2011-01-14 – 2011-01-16 (×3): 25 mg via ORAL
  Filled 2011-01-13 (×2): qty 1
  Filled 2011-01-13 (×3): qty 0.5

## 2011-01-13 MED ORDER — SIMVASTATIN 20 MG PO TABS
20.0000 mg | ORAL_TABLET | Freq: Every day | ORAL | Status: DC
  Administered 2011-01-14 – 2011-01-16 (×3): 20 mg via ORAL
  Filled 2011-01-13 (×5): qty 1

## 2011-01-13 MED ORDER — NEPRO/CARBSTEADY PO LIQD
237.0000 mL | Freq: Every day | ORAL | Status: DC
  Administered 2011-01-14 – 2011-01-17 (×2): 237 mL via ORAL
  Filled 2011-01-13 (×2): qty 237

## 2011-01-13 MED ORDER — METOPROLOL TARTRATE 1 MG/ML IV SOLN
2.0000 mg | INTRAVENOUS | Status: DC | PRN
  Filled 2011-01-13: qty 5

## 2011-01-13 MED ORDER — DOCUSATE SODIUM 100 MG PO CAPS
100.0000 mg | ORAL_CAPSULE | Freq: Two times a day (BID) | ORAL | Status: DC
  Administered 2011-01-14 – 2011-01-17 (×7): 100 mg via ORAL
  Filled 2011-01-13 (×6): qty 1

## 2011-01-13 MED ORDER — METOPROLOL TARTRATE 50 MG PO TABS
50.0000 mg | ORAL_TABLET | ORAL | Status: DC
  Filled 2011-01-13: qty 1

## 2011-01-13 MED ORDER — LIDOCAINE-PRILOCAINE 2.5-2.5 % EX CREA
TOPICAL_CREAM | CUTANEOUS | Status: DC
  Filled 2011-01-13: qty 5

## 2011-01-13 MED ORDER — SORBITOL 70 % SOLN
30.0000 mL | Status: DC | PRN
  Filled 2011-01-13: qty 30

## 2011-01-13 MED ORDER — INSULIN ASPART 100 UNIT/ML ~~LOC~~ SOLN
0.0000 [IU] | Freq: Three times a day (TID) | SUBCUTANEOUS | Status: DC
  Administered 2011-01-14: 1 [IU] via SUBCUTANEOUS
  Administered 2011-01-14: 5 [IU] via SUBCUTANEOUS
  Administered 2011-01-15 (×2): 3 [IU] via SUBCUTANEOUS
  Administered 2011-01-16 (×2): 1 [IU] via SUBCUTANEOUS
  Administered 2011-01-17: 2 [IU] via SUBCUTANEOUS
  Administered 2011-01-17: 3 [IU] via SUBCUTANEOUS
  Filled 2011-01-13 (×2): qty 3

## 2011-01-13 MED ORDER — CALCIUM ACETATE 667 MG PO CAPS
667.0000 mg | ORAL_CAPSULE | Freq: Three times a day (TID) | ORAL | Status: DC
  Administered 2011-01-14 – 2011-01-17 (×10): 667 mg via ORAL
  Filled 2011-01-13 (×15): qty 1

## 2011-01-13 MED ORDER — BISACODYL 5 MG PO TBEC: 10.0000 mg | DELAYED_RELEASE_TABLET | Freq: Every day | ORAL | Status: DC | PRN

## 2011-01-13 MED ORDER — PARICALCITOL 5 MCG/ML IV SOLN
1.0000 ug | INTRAVENOUS | Status: DC
  Administered 2011-01-16: 1 ug via INTRAVENOUS
  Filled 2011-01-13 (×2): qty 0.2

## 2011-01-13 MED ORDER — HYDROXYZINE HCL 25 MG PO TABS
25.0000 mg | ORAL_TABLET | Freq: Three times a day (TID) | ORAL | Status: DC | PRN
  Filled 2011-01-13: qty 1

## 2011-01-13 MED ORDER — INSULIN GLARGINE 100 UNIT/ML ~~LOC~~ SOLN
5.0000 [IU] | Freq: Every day | SUBCUTANEOUS | Status: DC
  Administered 2011-01-14 – 2011-01-16 (×3): 5 [IU] via SUBCUTANEOUS

## 2011-01-13 MED ORDER — DULOXETINE HCL 20 MG PO CPEP
20.0000 mg | ORAL_CAPSULE | Freq: Every day | ORAL | Status: DC
  Administered 2011-01-14 – 2011-01-17 (×4): 20 mg via ORAL
  Filled 2011-01-13 (×5): qty 1

## 2011-01-13 MED ORDER — PHENOL 1.4 % MT LIQD
1.0000 | OROMUCOSAL | Status: DC | PRN
  Filled 2011-01-13: qty 177

## 2011-01-14 DIAGNOSIS — I739 Peripheral vascular disease, unspecified: Secondary | ICD-10-CM

## 2011-01-14 LAB — GLUCOSE, CAPILLARY
Glucose-Capillary: 101 mg/dL — ABNORMAL HIGH (ref 70–99)
Glucose-Capillary: 147 mg/dL — ABNORMAL HIGH (ref 70–99)
Glucose-Capillary: 147 mg/dL — ABNORMAL HIGH (ref 70–99)

## 2011-01-14 LAB — CBC
MCHC: 32.6 g/dL (ref 30.0–36.0)
Platelets: 81 10*3/uL — ABNORMAL LOW (ref 150–400)
RDW: 16.3 % — ABNORMAL HIGH (ref 11.5–15.5)

## 2011-01-14 MED ORDER — VANCOMYCIN HCL 1000 MG IV SOLR
1500.0000 mg | INTRAVENOUS | Status: AC
  Administered 2011-01-14: 1500 mg via INTRAVENOUS
  Filled 2011-01-14: qty 1500

## 2011-01-14 MED ORDER — NEPRO/CARBSTEADY PO LIQD
237.0000 mL | Freq: Three times a day (TID) | ORAL | Status: DC | PRN
  Administered 2011-01-14: 237 mL via ORAL

## 2011-01-14 MED ORDER — METOPROLOL SUCCINATE ER 25 MG PO TB24
25.0000 mg | ORAL_TABLET | Freq: Every day | ORAL | Status: DC
  Administered 2011-01-14 – 2011-01-16 (×3): 25 mg via ORAL
  Filled 2011-01-14 (×4): qty 1

## 2011-01-14 MED ORDER — VANCOMYCIN HCL 1000 MG IV SOLR
750.0000 mg | INTRAVENOUS | Status: DC
  Filled 2011-01-14: qty 750

## 2011-01-14 NOTE — Progress Notes (Signed)
Preliminary report is ABI is within normal limits with abnormal Doppler waveforms bilaterally.  Smiley Houseman 01/14/2011, 10:22 AM

## 2011-01-14 NOTE — Progress Notes (Signed)
[facility]  Subjective  - POD #2  No events overnight, continues significant pain in right arm  Objective Patient Vitals for the past 24 hrs:  BP Temp Temp src Pulse Resp SpO2  01/14/11 0426 150/73 mmHg 98.9 F (37.2 C) Oral 67  20  99 %    PULM  CTAB CV  RRR GI  soft, NTND VASC  R arm incision c/d/i, no bleeding, palpable radial pulse, limited right movt due to pain  Assessment/Planning: POD #2 s/p exc infected R arm AVG   OT evaluation pending to aid with mobility in right arm  Continue hospitalization until pain better controlled  Stable H/H at this point and no clinical evidence of continued bleeding  --  Laboratory CBC    Component Value Date/Time   WBC 5.5 01/14/2011 0500   HGB 8.5* 01/14/2011 0500   HCT 26.1* 01/14/2011 0500   PLT 81* 01/14/2011 0500    BMET    Component Value Date/Time   NA 137 01/13/2011 0456   K 4.8 01/13/2011 0456   CL 96 01/13/2011 0456   CO2 28 01/13/2011 0456   GLUCOSE 167* 01/13/2011 0456   BUN 49* 01/13/2011 0456   CREATININE 8.65* 01/13/2011 0456   CALCIUM 8.9 01/13/2011 0456   CALCIUM 8.6 07/07/2009 1725   GFRNONAA 4* 01/13/2011 0456   GFRAA 4* 01/13/2011 0456    COAG Lab Results  Component Value Date   INR 1.35 01/13/2011   INR 1.66* 01/12/2011   INR 2.21* 01/11/2011   No results found for this basename: PTT    Antibiotics Anti-infectives    None       Leonides Sake, MD [facility] Office: [phone] Pager: [phone]  01/14/2011, 7:40 AM

## 2011-01-14 NOTE — Progress Notes (Signed)
Physical Therapy Evaluation Patient Details Name: Ebony [name] MRN: [id number] DOB: [birth year] Today's Date: 01/14/2011  Problem List:  Patient Active Problem List  Diagnoses  . DIABETES MELLITUS, TYPE II, ON INSULIN  . HYPERPARATHYROIDISM, SECONDARY  . ANEMIA OF CHRONIC DISEASE  . DEMENTIA, MILD  . HYPERTENSION, UNSPECIFIED  . CARDIOMYOPATHY, SECONDARY  . ATRIAL FIBRILLATION  . CEREBRAL EMBOLISM WITH CEREBRAL INFARCTION  . End stage renal disease  . BACTEREMIA  . METHICILLIN RESISTANT STAPHYLOCOCCUS AUREUS INFECTION    Past Medical History: No past medical history on file. Past Surgical History: No past surgical history on file.  PT Assessment/Plan/Recommendation PT Assessment Clinical Impression Statement: Pt will benefit from continued strengthening to increase mobility and I. with transfers. Pt motivated to increase mobility and return to SNF. Pt is primarily limited by residual deficits from prior CVAs as well as pain from Rt. UE surgery.  PT Recommendation/Assessment: Patient will need skilled PT in the acute care venue PT Problem List: Decreased strength;Decreased activity tolerance;Decreased balance;Decreased mobility;Decreased cognition;Decreased knowledge of use of DME;Cardiopulmonary status limiting activity;Pain Problem List Comments:  (anticipate balance deficits) Barriers to Discharge Comments: None  PT Therapy Diagnosis : Difficulty walking;Abnormality of gait;Generalized weakness;Acute pain;Hemiplegia non-dominant side PT Plan PT Frequency: Min 2X/week PT Treatment/Interventions: DME instruction;Gait training;Functional mobility training;Therapeutic exercise;Balance training;Patient/family education PT Recommendation Follow Up Recommendations: Skilled nursing facility Equipment Recommended: None recommended by PT PT Goals  Acute Rehab PT Goals PT Goal Formulation: With patient Time For Goal Achievement: 2 weeks Pt will go Supine/Side to Sit: with min  assist PT Goal: Supine/Side to Sit - Progress: Progressing toward goal Pt will go Sit to Supine/Side: with min assist PT Goal: Sit to Supine/Side - Progress: Progressing toward goal Pt will Transfer Sit to Stand/Stand to Sit: with min assist PT Transfer Goal: Sit to Stand/Stand to Sit - Progress: Progressing toward goal Pt will Transfer Bed to Chair/Chair to Bed: with min assist PT Transfer Goal: Bed to Chair/Chair to Bed - Progress: Progressing toward goal Pt will Ambulate: 1 - 15 feet;with mod assist (with hemiwalker) PT Goal: Ambulate - Progress: Progressing toward goal  PT Evaluation Precautions/Restrictions   Pt was NWB with Rt.UE as precaution.  Prior Functioning  Home Living Lives With:  (At SNF) Receives Help From: Personal care attendant Type of Home: Skilled Nursing Facility Home Adaptive Equipment: Wheelchair - manual (Hemiwalker) Prior Function Level of Independence: Needs assistance with ADLs;Needs assistance with gait Bath: Moderate Toileting: Minimal Dressing: Minimal Feeding: Supervision/set-up (to cut up food) Driving: No Vocation: Unemployed Financial risk analyst Arousal/Alertness: Awake/alert (minimally lethargic) Overall Cognitive Status: Appears within functional limits for tasks assessed Cognition - Other Comments: Pt with decreased processing speed Sensation/Coordination Sensation Light Touch: Not tested Extremity Assessment RLE Strength Right Knee Flexion: 3/5 Right Knee Extension: 3/5 Right Ankle Dorsiflexion: 3-/5 Right Ankle Plantar Flexion: 3/5 LLE Strength Left Hip Flexion: 2+/5 Left Knee Flexion: 2+/5 Left Knee Extension: 2+/5 Left Ankle Dorsiflexion: 2/5 Left Ankle Plantar Flexion: 3-/5 Mobility (including Balance) Bed Mobility Bed Mobility: Yes Supine to Sit: 2: Max assist Supine to Sit Details (indicate cue type and reason): Verbal cues and tactile cues for sequence, advancement of Bil.LEs. PT to use pad to rotate pelvis and scoot EOB.   Transfers Transfers: Yes Stand Pivot Transfers: 1: +2 Total assist Stand Pivot Transfer Details (indicate cue type and reason): (pt= 40%), verbal and tactile cues for use of lower extremities and to not use Rt. UE.  Ambulation/Gait Ambulation/Gait: No Stairs: No Wheelchair Mobility Wheelchair Mobility: No  Balance Balance Assessed: Yes Static Sitting Balance Static Sitting - Balance Support: No upper extremity supported;Feet supported Static Sitting - Level of Assistance: 5: Stand by assistance Static Sitting - Comment/# of Minutes: Approximately 5-7 min Exercise  General Exercises - Lower Extremity Ankle Circles/Pumps: AAROM;AROM;15 reps;Seated End of Session PT - End of Session Equipment Utilized During Treatment: Gait belt Activity Tolerance: Patient tolerated treatment well;Patient limited by fatigue;Patient limited by pain Patient left: in chair;with call bell in reach Nurse Communication: Mobility status for transfers;Need for lift equipment (Keep Rt. UE NWB as a precaution) General Behavior During Session: Lethargic (some depressive affect) Cognition:  (slow to process)  Sherie Don 01/14/2011, 4:22 PM    My married name, and the name on my boards is Sherrine Maples PT, DPT. This is in the process of being corrected.

## 2011-01-14 NOTE — Progress Notes (Signed)
ANTIBIOTIC CONSULT NOTE - FOLLOW UP  Pharmacy Consult for Vancomycin Indication:MRSA Sepsis/AVG infection  Allergies  Allergen Reactions  . Aspirin   . Heparin   . Minoxidil   . Penicillins     Patient Measurements: Height: 5' 6.5" (168.9 cm) (Entered for [employer]) Weight: 147 lb 0.8 oz (66.7 kg) (Entered for [employer]) IBW/kg (Calculated) : 60.45    Vital Signs: Temp: 98.9 F (37.2 C) (11/04 0426) Temp src: Oral (11/04 0426) BP: 150/73 mmHg (11/04 0426) Pulse Rate: 67  (11/04 0426) Intake/Output from previous day:   Intake/Output from this shift:    Labs:  Basename 01/14/11 0500 01/13/11 0456 01/12/11 1137 01/11/11 1740  WBC 5.5 5.2 -- 3.0*  HGB 8.5* 8.9* 10.9* --  PLT 81* 124* -- 153  LABCREA -- -- -- --  CREATININE -- 8.65* -- 6.96*   Estimated Creatinine Clearance: 5.1 ml/min (by C-G formula based on Cr of 8.65).   Microbiology: Recent Results (from the past 720 hour(s))  SURGICAL PCR SCREEN     Status: Normal   Collection Time   01/11/11 12:25 PM      Component Value Range Status Comment   MRSA, PCR NEGATIVE  NEGATIVE  Final    Staphylococcus aureus NEGATIVE  NEGATIVE  Final   WOUND CULTURE     Status: Normal (Preliminary result)   Collection Time   01/12/11  2:50 PM      Component Value Range Status Comment   Specimen Description WOUND ARM RIGHT   Final    Special Requests ARTERIO VENOUS GRAFT PT ON VANCOMYCIN   Final    Gram Stain     Final    Value: FEW WBC PRESENT, PREDOMINANTLY PMN     NO SQUAMOUS EPITHELIAL CELLS SEEN     NO ORGANISMS SEEN   Culture     Final    Value: FEW STAPHYLOCOCCUS AUREUS     Note: RIFAMPIN AND GENTAMICIN SHOULD NOT BE USED AS SINGLE DRUGS FOR TREATMENT OF STAPH INFECTIONS.   Report Status PENDING   Incomplete     Anti-infectives    None      Assessment: POD #2 s/p exc infected R arm AVG   Goal of Therapy:  Pre-Dialysis levels 15-25 mcg/ml Plan:  1.  Vancomycin 1500mg  IV x 1 now. 2.  Vancomycin 750 mg IV q HD  TTS.  Velda Shell J 01/14/2011,9:20 AM

## 2011-01-14 NOTE — Patient Instructions (Signed)
Pt instructed to not use Rt. UE for transfers until instructed otherwise for pain modulation.

## 2011-01-14 NOTE — Progress Notes (Signed)
Subjective: I nterval History: none.2 Objective: Vital signs in last 24 hours: Temp:  [98.9 F (37.2 C)] 98.9 F (37.2 C) (11/04 0426) Pulse Rate:  [67] 67  (11/04 0426) Resp:  [20] 20  (11/04 0426) BP: (150)/(73) 150/73 mmHg (11/04 0426) SpO2:  [99 %] 99 % (11/04 0426) Weight change:   Intake/Output from previous day:   Intake/Output this shift:    General appearance: edematous Resp: clear to auscultation bilaterally Cardio: S1, S2 normal and systolic murmur: systolic ejection 2/6, coarse at 2nd left intercostal space GI: liver down 2 cm Extremities: AVG resx site R FA, staples, warm, reddened.  Old L UA incisions clean. Incision/Wound:  Lab Results:  Basename 01/14/11 0500 01/13/11 0456  WBC 5.5 5.2  HGB 8.5* 8.9*  HCT 26.1* 26.9*  PLT 81* 124*   BMET:  Basename 01/13/11 0456 01/12/11 1137 01/11/11 1740  NA 137 135 --  K 4.8 3.8 --  CL 96 -- 91*  CO2 28 -- 27  GLUCOSE 167* 89 --  BUN 49* -- 39*  CREATININE 8.65* -- 6.96*  CALCIUM 8.9 -- 9.7   No results found for this basename: PTH:2 in the last 72 hours Iron Studies:  Basename 01/13/11 0955  IRON 67  TIBC 187*  TRANSFERRIN --  FERRITIN --    Studies/Results: Ir Fluoro Guide Cv Line Left  01/12/2011  *RADIOLOGY REPORT*  Clinical Data: End-stage renal disease no current access, infected AV graft for removal.  PICC line access for operative procedure  PICC LINE PLACEMENT WITH ULTRASOUND AND FLUOROSCOPIC  GUIDANCE  Fluoroscopy Time: 4.2 minutes minutes.  The left neck was prepped with chlorhexidine, draped in the usual sterile fashion using maximum barrier technique (cap and mask, sterile gown, sterile gloves, large sterile sheet, hand hygiene and cutaneous antisepsis) and infiltrated locally with 1% Lidocaine.  Ultrasound demonstrated patency of the left internal jugular vein, and this was documented with an image.  Under real-time ultrasound guidance, this vein was accessed with a 21 gauge micropuncture needle  and image documentation was performed.  The needle was exchanged over a guidewire for a peel-away sheath through which a 5 [place] double lumen PICC trimmed to 20 cm was advanced, positioned with its tip at the lower SVC/right atrial junction.  Fluoroscopy during the procedure and fluoro spot radiograph confirms appropriate catheter position.  The catheter was flushed, secured to the skin with Prolene sutures, and covered with a sterile dressing.  Complications:  No immediate  IMPRESSION: Successful left internal jugular PICC line placement with ultrasound and fluoroscopic guidance.  The catheter is ready for use.  Original Report Authenticated By: Judie Petit. Ruel Favors, M.D.   Ir US Guide Vasc Access Left  01/12/2011  *RADIOLOGY REPORT*  Clinical Data: End-stage renal disease no current access, infected AV graft for removal.  PICC line access for operative procedure  PICC LINE PLACEMENT WITH ULTRASOUND AND FLUOROSCOPIC  GUIDANCE  Fluoroscopy Time: 4.2 minutes minutes.  The left neck was prepped with chlorhexidine, draped in the usual sterile fashion using maximum barrier technique (cap and mask, sterile gown, sterile gloves, large sterile sheet, hand hygiene and cutaneous antisepsis) and infiltrated locally with 1% Lidocaine.  Ultrasound demonstrated patency of the left internal jugular vein, and this was documented with an image.  Under real-time ultrasound guidance, this vein was accessed with a 21 gauge micropuncture needle and image documentation was performed.  The needle was exchanged over a guidewire for a peel-away sheath through which a 5 [place] double lumen PICC trimmed  to 20 cm was advanced, positioned with its tip at the lower SVC/right atrial junction.  Fluoroscopy during the procedure and fluoro spot radiograph confirms appropriate catheter position.  The catheter was flushed, secured to the skin with Prolene sutures, and covered with a sterile dressing.  Complications:  No immediate  IMPRESSION:  Successful left internal jugular PICC line placement with ultrasound and fluoroscopic guidance.  The catheter is ready for use.  Original Report Authenticated By: Judie Petit. Ruel Favors, M.D.   Dg Chest Portable 1 View  01/12/2011  *RADIOLOGY REPORT*  Clinical Data: Postop dialysis catheter placement.  PORTABLE CHEST - 1 VIEW  Comparison: 01/11/2011  Findings: 1629 hours.  Right IJ dialysis catheter is new in the interval.  Distal tip of the catheter projects at or just distal to the SVC / RA junction.  A left IJ central line is also noted with the tip positioned at the proximal SVC level.  The vascular stent is noted in the left axillary region.  Lungs are clear.  No evidence for pneumothorax.  IMPRESSION: Right IJ dialysis catheter tip projects at or just distal to the SVC / RA junction.  Original Report Authenticated By: ERIC A. MANSELL, M.D.   Dg Fluoro Guide Cv Line-no Report  01/12/2011  CLINICAL DATA: Diatek Placement   FLOURO GUIDE CV LINE  Fluoroscopy was utilized by the requesting physician.  No radiographic  interpretation.      I have reviewed the patient's current medications. Add Vanco, adjust B Blocker   Assessment/Plan: 1 AVG infx, ? Metastatic. Needs Vanco 2 ESRD TTS, vol xs 3 HTN needs better control 4DM fair control 5 Anemia, max EPO,  6 CVA  P:  AB, local care, HD, EPO.  Mobilize.    LOS: 3 days   Nguyen Todorov L 01/14/2011,8:48 AM

## 2011-01-14 NOTE — Progress Notes (Deleted)
Physical Therapy Evaluation Patient Details Name: Ebony [name] MRN: [id number] DOB: Jul 18, 1929 Today's Date: 01/14/2011  Problem List:  Patient Active Problem List  Diagnoses  . DIABETES MELLITUS, TYPE II, ON INSULIN  . HYPERPARATHYROIDISM, SECONDARY  . ANEMIA OF CHRONIC DISEASE  . DEMENTIA, MILD  . HYPERTENSION, UNSPECIFIED  . CARDIOMYOPATHY, SECONDARY  . ATRIAL FIBRILLATION  . CEREBRAL EMBOLISM WITH CEREBRAL INFARCTION  . End stage renal disease  . BACTEREMIA  . METHICILLIN RESISTANT STAPHYLOCOCCUS AUREUS INFECTION    Past Medical History: No past medical history on file. Past Surgical History: No past surgical history on file.  PT Assessment/Plan/Recommendation PT Assessment Clinical Impression Statement: Pt will benefit from continued strengthening to increase mobility and I. with transfers. Pt motivated to increase mobility and return to SNF. Pt is primarily limited by residual deficits from prior CVAs as well as pain from Rt. UE surgery.  PT Recommendation/Assessment: Patient will need skilled PT in the acute care venue PT Problem List: Decreased strength;Decreased activity tolerance;Decreased balance;Decreased mobility;Decreased cognition;Decreased knowledge of use of DME;Cardiopulmonary status limiting activity;Pain Problem List Comments:  (anticipate balance deficits) Barriers to Discharge Comments: None  PT Therapy Diagnosis : Difficulty walking;Abnormality of gait;Generalized weakness;Acute pain;Hemiplegia non-dominant side PT Plan PT Frequency: Min 2X/week PT Treatment/Interventions: DME instruction;Gait training;Functional mobility training;Therapeutic exercise;Balance training;Patient/family education PT Recommendation Follow Up Recommendations: Skilled nursing facility Equipment Recommended: None recommended by PT PT Goals  Acute Rehab PT Goals PT Goal Formulation: With patient Time For Goal Achievement: 2 weeks Pt will go Supine/Side to Sit: with min  assist PT Goal: Supine/Side to Sit - Progress: Progressing toward goal Pt will go Sit to Supine/Side: with min assist PT Goal: Sit to Supine/Side - Progress: Progressing toward goal Pt will Transfer Sit to Stand/Stand to Sit: with min assist PT Transfer Goal: Sit to Stand/Stand to Sit - Progress: Progressing toward goal Pt will Transfer Bed to Chair/Chair to Bed: with min assist PT Transfer Goal: Bed to Chair/Chair to Bed - Progress: Progressing toward goal Pt will Ambulate: 1 - 15 feet;with mod assist (with hemiwalker) PT Goal: Ambulate - Progress: Progressing toward goal  PT Evaluation Precautions/Restrictions    Prior Functioning  Home Living Lives With:  (At SNF) Receives Help From: Personal care attendant Type of Home: Skilled Nursing Facility Home Adaptive Equipment: Wheelchair - manual (Hemiwalker) Prior Function Level of Independence: Needs assistance with ADLs;Needs assistance with gait Bath: Moderate Toileting: Minimal Dressing: Minimal Feeding: Supervision/set-up (to cut up food) Driving: No Vocation: Unemployed Financial risk analyst Arousal/Alertness: Awake/alert (minimally lethargic) Overall Cognitive Status: Appears within functional limits for tasks assessed Cognition - Other Comments: Pt with decreased processing speed Sensation/Coordination Sensation Light Touch: Not tested Extremity Assessment RLE Strength Right Knee Flexion: 3/5 Right Knee Extension: 3/5 Right Ankle Dorsiflexion: 3-/5 Right Ankle Plantar Flexion: 3/5 LLE Strength Left Hip Flexion: 2+/5 Left Knee Flexion: 2+/5 Left Knee Extension: 2+/5 Left Ankle Dorsiflexion: 2/5 Left Ankle Plantar Flexion: 3-/5 Mobility (including Balance) Bed Mobility Bed Mobility: Yes Supine to Sit: 2: Max assist Supine to Sit Details (indicate cue type and reason): Verbal cues and tactile cues for sequence, advancement of Bil.LEs. PT to use pad to rotate pelvis and scoot EOB.  Transfers Transfers: Yes Stand  Pivot Transfers: 1: +2 Total assist Stand Pivot Transfer Details (indicate cue type and reason): (pt= 40%), verbal and tactile cues for use of lower extremities and to not use Rt. UE.  Ambulation/Gait Ambulation/Gait: No Stairs: No Wheelchair Mobility Wheelchair Mobility: No  Balance Balance Assessed: Yes Static Sitting  Balance Static Sitting - Balance Support: No upper extremity supported;Feet supported Static Sitting - Level of Assistance: 5: Stand by assistance Static Sitting - Comment/# of Minutes: Approximately 5-7 min Exercise  General Exercises - Lower Extremity Ankle Circles/Pumps: AAROM;AROM;15 reps;Seated End of Session PT - End of Session Equipment Utilized During Treatment: Gait belt Activity Tolerance: Patient tolerated treatment well;Patient limited by fatigue;Patient limited by pain Patient left: in chair;with call bell in reach Nurse Communication: Mobility status for transfers;Need for lift equipment (Keep Rt. UE NWB as a precaution) General Behavior During Session: Lethargic (some depressive affect) Cognition:  (slow to process)  Sherie Don 01/14/2011, 4:25 PM  My married name, and the name on my boards is Sherrine Maples PT, DPT. It is in the process of being changed.

## 2011-01-15 LAB — WOUND CULTURE

## 2011-01-15 LAB — GLUCOSE, CAPILLARY
Glucose-Capillary: 233 mg/dL — ABNORMAL HIGH (ref 70–99)
Glucose-Capillary: 225 mg/dL — ABNORMAL HIGH (ref 70–99)

## 2011-01-15 MED ORDER — SODIUM CHLORIDE 0.9 % IV SOLN: 100.0000 mL | INTRAVENOUS | Status: DC | PRN

## 2011-01-15 MED ORDER — NEPRO/CARBSTEADY PO LIQD: 237.0000 mL | Freq: Every day | ORAL | Status: DC

## 2011-01-15 MED ORDER — LIDOCAINE-PRILOCAINE 2.5-2.5 % EX CREA
1.0000 "application " | TOPICAL_CREAM | CUTANEOUS | Status: DC | PRN
  Filled 2011-01-15: qty 5

## 2011-01-15 MED ORDER — CALCIUM CARBONATE 1250 MG/5ML PO SUSP: 500.0000 mg | Freq: Four times a day (QID) | ORAL | Status: DC | PRN

## 2011-01-15 MED ORDER — LIDOCAINE HCL (PF) 1 % IJ SOLN
5.0000 mL | INTRAMUSCULAR | Status: DC | PRN
  Filled 2011-01-15: qty 5

## 2011-01-15 MED ORDER — OXYCODONE HCL 5 MG PO TABS: 5.0000 mg | ORAL_TABLET | ORAL | Status: DC | PRN

## 2011-01-15 MED ORDER — PENTAFLUOROPROP-TETRAFLUOROETH EX AERO: 1.0000 "application " | INHALATION_SPRAY | CUTANEOUS | Status: DC | PRN

## 2011-01-15 MED ORDER — RENA-VITE PO TABS: 1.0000 | ORAL_TABLET | Freq: Every day | ORAL | Status: DC

## 2011-01-15 NOTE — Progress Notes (Signed)
  Subjective: Thinks right arm swelling is better. Able to feed self.  Objective: Vital signs in last 24 hours: Temp:  [97.4 F (36.3 C)-98.8 F (37.1 C)] 98.6 F (37 C) (11/05 0532) Pulse Rate:  [62-85] 62  (11/05 0532) Resp:  [16-20] 18  (11/05 0532) BP: (119-133)/(59-78) 120/59 mmHg (11/05 0532) SpO2:  [90 %-100 %] 100 % (11/05 0532) Weight:  [70.1 kg (154 lb 8.7 oz)] 154 lb 8.7 oz (70.1 kg) (11/04 2110)  Physician Exam: General: Sitting in chair. NAD. Sense of humor intact HEENT:  Facial edema: Heart: irreg, soft heart sounds, gr 3/6 M, holosys 2 plus edema Lungs: crackles left base Extremities: R arm edema. Slight erythema.Left arm contracted with rolled washcloth in hand; R arm swollen, staples intact, no D/C generally puffy; no significant LE edema; SCDs in place Access: R I - J dialysis catheter and left CVC catheter  Lab Results:  Basename 01/14/11 0500 01/13/11 0456  WBC 5.5 5.2  HGB 8.5* 8.9*  HCT 26.1* 26.9*  PLT 81* 124*   Renal:  Basename 01/13/11 0456 01/12/11 1137  NA 137 135  K 4.8 3.8  CL 96 --  CO2 28 --  GLUCOSE 167* 89  BUN 49* --  CREATININE 8.65* --  CALCIUM 8.9 --  PHOS 4.4 --  ALBUMIN 2.8* --   Iron Studies:  Basename 01/13/11 0955  IRON 67  TIBC 187*  TRANSFERRIN --  FERRITIN --   I have reviewed scheduled and prn medications.  Assessment/Plan: 1. MRSA bacteremia s/p removal of infected graft.  Vanc started 10/27 as out patient.  Last dose here 11/3 received 1 gm at dialysis and 1.5 gm 11/4. Next dose ordered for Tues @ 750 mg. 2. ESRD -  TTS [facility] per routine 3 Hypertension/volume  - BP ok but excess volume 4. ABLA Anemia  - Hgb drifting down. Cont max Aranesp 5. Metabolic bone disease -  Continue current meds. 6. Nutrition - diet plus supplements 7. Disp - to return to [facility] on [address]. 8 DM good control.   9 CVA very weak. Sheffield Slider, PA-C Seen and eval with changes noted. [facility] Beeper  [phone]    01/15/2011,8:53 AM  LOS: 4 days

## 2011-01-15 NOTE — Op Note (Signed)
Ebony, [name] NO.:  [id number]  MEDICAL RECORD NO.:  [id number]  LOCATION:  3308                         FACILITY:  [facility]  PHYSICIAN:  Quita Skye. Hart Rochester, M.D.  DATE OF BIRTH:  1929/12/13  DATE OF PROCEDURE:  01/12/2011 DATE OF DISCHARGE:                              OPERATIVE REPORT   PREOPERATIVE DIAGNOSIS:  End-stage renal disease with suspected infection of arteriovenous Gore-Tex graft, right forearm.  POSTOPERATIVE DIAGNOSIS:  End-stage renal disease with suspected infection of arteriovenous Gore-Tex graft, right forearm.  OPERATIONS: 1. Bilateral ultrasound localization internal jugular veins. 2. Insertion of Diatek catheter via right internal jugular vein (23     cm). 3. Removal of AV Gore-Tex graft, right forearm with repair of right     brachial artery, over-sewing stump of Gore-Tex graft.  SURGEON:  Quita Skye. Hart Rochester, MD  FIRST ASSISTANCE:  Newton Pigg, [place]  ANESTHESIA:  General endotracheal.  DESCRIPTION OF PROCEDURE:  The patient was taken to the operating room, placed in supine position, at which time satisfactory general endotracheal anesthesia was administered.  Initially, the upper chest and neck areas were exposed.  Both internal jugular veins were imaged using B-mode ultrasound, both noted to be patent, right was smaller than the left.  After infiltration of 1% Xylocaine, the right internal jugular vein was entered using a supraclavicular approach.  Guidewire passed into the right atrium under fluoroscopic guidance.  After dilating the tract appropriately, 23-cm Diatek catheter was passed through a peel-away sheath, positioned in the right atrium, tunneled peripherally and secured with nylon sutures.  Sterile dressing applied. Attention was turned to the right upper extremity which was then prepped and draped in routine sterile manner.  There was a loop forearm graft in the right side which had been placed in April 2012.   There was some blistering at about the 3 o'clock to 4 o'clock position on the arterial half of the loop which was on the ulnar side of the forearm.  Initially, an incision was made in the antecubital area to expose the arterial anastomosis.  This was done.  There was no evidence of any purulence or infection in this area.  Therefore, the partial occlusion clamp was utilized.  The graft was clamped and transected at the arterial anastomosis, leaving about a 2-3 mm cuff which was oversewn with 5-0 Prolene and following completion of this, there was an excellent Doppler flow in the radial artery at the wrist.  Following this, the graft was completely removed through multiple incisions along the medial aspect of the right forearm.  There was much more bleeding than normally one encounters with this procedure.  It appeared that she had venous hypertension although her arm was not severely swollen.  Multiple veins particularly just beneath the surface of the skin bled significantly, requiring multiple interrupted figure-of-eight 3-0 Vicryl sutures to be placed.  The graft was completely removed, however, sent for culture, although no area of purulence or specific infection was noted even at the inflamed and blistered area.  After completely removal of the graft, an adequate hemostasis.  The wound was closed with interrupted 3-0 Vicryl and skin staples with a compression dressing applied.  The patient  was taken to the recovery room in stable condition.     Quita Skye Hart Rochester, M.D.     JDL/MEDQ  D:  01/12/2011  T:  01/13/2011  Job:  [id number]  Electronically Signed by Josephina Gip M.D. on 01/15/2011 02:25:17 PM

## 2011-01-15 NOTE — Progress Notes (Signed)
  Patient doing well. Decreased pain in R UE.On exam    3+ pulse palpable in Radial artery. Good motion and sensation.    Plan DC tomorrow post HD>

## 2011-01-15 NOTE — H&P (Signed)
NAMEHONESTEE, Ebony [name]             ACCOUNT NO.:  [id number]  MEDICAL RECORD NO.:  [id number]  LOCATION:  2022                         FACILITY:  [facility]  PHYSICIAN:  Juleen China IV, MDDATE OF BIRTH:  [birth year]  DATE OF ADMISSION:  01/11/2011 DATE OF DISCHARGE:                             HISTORY & PHYSICAL   REASON FOR ADMISSION:  Elevated INR with need for open surgical intervention.  HISTORY:  This is a 77 year old female who has previously required removal of an infected left upper arm stent.  She currently dialyzes through a right forearm graft, which was recently undergone thrombolysis on October 3.  This is now reoccluded.  There is concern over infection in the graft and request was made for open removal of the infected graft and the dialysis catheter.  The patient is on Coumadin for chronic atrial fibrillation.  Her INR was 1.9.  Therefore, she is to be admitted for correction of her coagulopathy.  She last dialyzed on Tuesday.  The patient has history of stroke with left-sided deficits.  Again she has previously undergone resection of dialysis access due to infection. She suffers from diabetes and hypertension as well as anemia.  PAST MEDICAL HISTORY: 1. End-stage renal disease on dialysis Tuesday, Thursday, Saturday. 2. Anemia. 3. Atrial fibrillation on Coumadin. 4. Coronary artery disease. 5. Dementia. 6. Diabetes. 7. Hypertension. 8. History of MRSA bacteremia. 9. Stroke with left-sided deficit.  SOCIAL HISTORY:  She lives in a skilled nursing facility.  Does not smoke or drink.  FAMILY HISTORY:  Noncontributory.  REVIEW OF SYSTEMS:  Negative for chest pain.  Negative for shortness of breath.  PHYSICAL EXAMINATION:  VITAL SIGNS:  Heart rate 58, temperature 98.2, respirations 18, blood pressure 110/68, O2 sats are 93% on room air. GENERAL:  She is well appearing in no acute distress. HEENT:  Within normal limits. CARDIOVASCULAR:  Regular rate.   Respirations are nonlabored. NEURO:  She has paralysis of the left side with contractions of her left arm. EXTREMITIES:  The right side graft does not have a thrill on the lateral side.  There is somewhat of a thrill on the medial side.  There is a blistering area on the medial portion of the graft.  There was no purulence or erythema.  There is some warmth to the right arm.  PERTINENT LABORATORY VALUES:  INR is 1.9.  Potassium is 3.6.  ASSESSMENT/PLAN:  Occluded right forearm graft.  With the patient's history of a warm extremity and a occluded graft, the presumptive diagnosis is that the graft is infected according to the dialysis center.  Cultures have grown out MRSA.  The patient was originally brought in for removal of the graft and a Diatek catheter, however, with her elevated INR, I elected to admit her to correct her coagulation profile and proceed with the operation tomorrow.  We will plan on giving fresh frozen plasma tonight with repeat coagulation profile in the morning and plans for surgery tomorrow.  The patient will be kept n.p.o. after midnight.     Jorge Ny, MD     VWB/MEDQ  D:  01/11/2011  T:  01/12/2011  Job:  [id number]  Electronically Signed by  V. Myra Gianotti IV MD on 01/15/2011 10:47:07 PM

## 2011-01-15 NOTE — Progress Notes (Signed)
01/15/2011  Ebony [name] from  [facility] called at 1055, the wound culture done on 01/12/11. Culture was taken from wound on the right arm, was positive for MRSA Notified Dr. Coral Else  At 11:05 And he is aware. No orders were given.

## 2011-01-15 NOTE — Discharge Summary (Signed)
[facility] Discharge Summary   Patient ID:  Ebony [name] MRN: [id number] DOB/AGE: 10/03/1929 77 y.o.  Admit date: 01/11/2011 Discharge date: 01/16/11 Attending Surgeon: Andres Labrum Admission Diagnosis: Clotted graft  Discharge Diagnoses:  Clotted graft  Patient Active Problem List  Diagnoses  . DIABETES MELLITUS, TYPE II, ON INSULIN  . HYPERPARATHYROIDISM, SECONDARY  . ANEMIA OF CHRONIC DISEASE  . DEMENTIA, MILD  . HYPERTENSION, UNSPECIFIED  . CARDIOMYOPATHY, SECONDARY  . ATRIAL FIBRILLATION  . CEREBRAL EMBOLISM WITH CEREBRAL INFARCTION  . End stage renal disease  . BACTEREMIA  . METHICILLIN RESISTANT STAPHYLOCOCCUS AUREUS INFECTION   Procedures:  DATE OF PROCEDURE:  01/12/2011   DATE OF DISCHARGE:                                  OPERATIVE REPORT         PREOPERATIVE DIAGNOSIS:  End-stage renal disease with suspected   infection of arteriovenous Gore-Tex graft, right forearm.      POSTOPERATIVE DIAGNOSIS:  End-stage renal disease with suspected   infection of arteriovenous Gore-Tex graft, right forearm.      OPERATIONS:   1. Bilateral ultrasound localization internal jugular veins.   2. Insertion of Diatek catheter via right internal jugular vein (23       cm).   3. Removal of AV Gore-Tex graft, right forearm with repair of right       brachial artery, over-sewing stump of Gore-Tex graft.    Discharged Condition: good  Hospital Course:  Ebony [name] is a 77 y.o. female is S/P Dialysis Access Surgery;   Post-op wounds clean, dry, intact Pt. Ambulating,  and taking PO diet without difficulty. Pt pain controlled with PO pain meds. Labs as below Complications:none  Consults:  Treatment Team:  Trevor Iha, MD  Significant Diagnostic Studies: CBC    Component Value Date/Time   WBC 5.5 01/14/2011 0500   RBC 2.93* 01/14/2011 0500   HGB 8.5* 01/14/2011 0500   HCT 26.1* 01/14/2011 0500   PLT 81* 01/14/2011 0500   MCV  89.1 01/14/2011 0500   MCH 29.0 01/14/2011 0500   MCHC 32.6 01/14/2011 0500   RDW 16.3* 01/14/2011 0500   LYMPHSABS 1.4 05/22/2010 1651   MONOABS 0.5 05/22/2010 1651   EOSABS 0.2 05/22/2010 1651   BASOSABS 0.0 05/22/2010 1651    BMET    Component Value Date/Time   NA 137 01/13/2011 0456   K 4.8 01/13/2011 0456   CL 96 01/13/2011 0456   CO2 28 01/13/2011 0456   GLUCOSE 167* 01/13/2011 0456   BUN 49* 01/13/2011 0456   CREATININE 8.65* 01/13/2011 0456   CALCIUM 8.9 01/13/2011 0456   CALCIUM 8.6 07/07/2009 1725   GFRNONAA 4* 01/13/2011 0456   GFRAA 4* 01/13/2011 0456    COAG Lab Results  Component Value Date   INR 1.35 01/13/2011   INR 1.66* 01/12/2011   INR 2.21* 01/11/2011   No results found for this basename: PTT    Disposition:  Discharge to :Skilled nursing facility Discharge Orders    Future Orders Please Complete By Expires   Resume previous diet      Driving Restrictions      Comments:   No driving   Call MD for:  temperature >100.5      Call MD for:  redness, tenderness, or signs of infection (pain, swelling, bleeding, redness, odor or green/yellow discharge around incision site)  Call MD for:  severe or increased pain, loss or decreased feeling  in affected limb(s)         Cachet, Mccutchen  Home Medication Instructions LKG:[id number]   Printed on:01/15/11 1728  Medication Information                    insulin glargine (LANTUS) 100 UNIT/ML injection Inject 5 Units into the skin at bedtime.            DULoxetine (CYMBALTA) 20 MG capsule Take 20 mg by mouth daily.            omeprazole (PRILOSEC) 20 MG capsule Take 20 mg by mouth daily.            docusate sodium (COLACE) 100 MG capsule Take 100 mg by mouth 2 (two) times daily.            simvastatin (ZOCOR) 20 MG tablet Take 20 mg by mouth at bedtime.            traZODone (DESYREL) 50 MG tablet Take 25 mg by mouth at bedtime.            metoprolol tartrate (LOPRESSOR) 25 MG tablet Take 25 mg by mouth 4  (four) times a week. Sunday, Monday, Wednesday, Friday           calcium acetate (PHOSLO) 667 MG capsule Take 667 mg by mouth 3 (three) times daily with meals.            lidocaine-prilocaine (EMLA) cream Apply 1 application topically 3 (three) times a week. Tues, thurs, and sat 30 mins before dialysis           metoprolol (LOPRESSOR) 50 MG tablet Take 50 mg by mouth 4 (four) times a week. Sunday, Monday, Wednesday, Friday           bisacodyl (DULCOLAX) 5 MG EC tablet Take 10 mg by mouth daily as needed. For constipation            warfarin (COUMADIN) 5 MG tablet Take 5 mg by mouth every evening.             b complex-vitamin c-folic acid (NEPHRO-VITE) 0.8 MG TABS Take 1 tablet by mouth at bedtime.             amLODipine (NORVASC) 10 MG tablet Take 10 mg by mouth at bedtime.             calcium carbonate, dosed in mg elemental calcium, 1250 MG/5ML Take 5 mLs (500 mg of elemental calcium total) by mouth every 6 (six) hours as needed.           Nutritional Supplements (FEEDING SUPPLEMENT, NEPRO CARB STEADY,) LIQD Take 237 mLs by mouth daily.           multivitamin (RENA-VIT) TABS tablet Take 1 tablet by mouth at bedtime.           oxyCODONE (OXY IR/ROXICODONE) 5 MG immediate release tablet Take 1-2 tablets (5-10 mg total) by mouth every 4 (four) hours as needed.            Verbal and written Discharge instructions given to the patient. Wound care per Discharge AVS  Signed: Marlowe Shores 01/15/2011, 5:28 PM    01/17/2011 8:59 AM  Addendum Note to D/C Ebony [name] is a 76 y.o. female who is S/P removal right arm AVGG and insertion diatek catheter. The patient is stable and ready for discharge.   No changes to History, PE,  Meds or D/C instructions  Romualdo Prosise J 01/17/2011 9:02 AM

## 2011-01-16 ENCOUNTER — Inpatient Hospital Stay (HOSPITAL_COMMUNITY): Payer: Medicare Other

## 2011-01-16 LAB — CROSSMATCH
Antibody Screen: NEGATIVE
Unit division: 0
Unit division: 0

## 2011-01-16 LAB — CBC
HCT: 25.1 % — ABNORMAL LOW (ref 36.0–46.0)
Hemoglobin: 8.1 g/dL — ABNORMAL LOW (ref 12.0–15.0)
MCV: 89 fL (ref 78.0–100.0)
RBC: 2.82 MIL/uL — ABNORMAL LOW (ref 3.87–5.11)
RDW: 15.7 % — ABNORMAL HIGH (ref 11.5–15.5)
WBC: 4.7 10*3/uL (ref 4.0–10.5)

## 2011-01-16 LAB — GLUCOSE, CAPILLARY
Glucose-Capillary: 139 mg/dL — ABNORMAL HIGH (ref 70–99)
Glucose-Capillary: 175 mg/dL — ABNORMAL HIGH (ref 70–99)
Glucose-Capillary: 214 mg/dL — ABNORMAL HIGH (ref 70–99)

## 2011-01-16 LAB — VANCOMYCIN, TROUGH: Vancomycin Tr: 38.6 ug/mL (ref 10.0–20.0)

## 2011-01-16 LAB — RENAL FUNCTION PANEL
Albumin: 2.4 g/dL — ABNORMAL LOW (ref 3.5–5.2)
BUN: 37 mg/dL — ABNORMAL HIGH (ref 6–23)
CO2: 28 mEq/L (ref 19–32)
Chloride: 92 mEq/L — ABNORMAL LOW (ref 96–112)
Potassium: 4 mEq/L (ref 3.5–5.1)

## 2011-01-16 MED ORDER — SODIUM CHLORIDE 0.9 % IJ SOLN
10.0000 mL | Freq: Two times a day (BID) | INTRAMUSCULAR | Status: DC
  Administered 2011-01-16: 10 mL via INTRAVENOUS

## 2011-01-16 MED ORDER — OXYCODONE HCL 5 MG PO TABS: 5.0000 mg | ORAL_TABLET | ORAL | Status: AC | PRN

## 2011-01-16 MED ORDER — PARICALCITOL 5 MCG/ML IV SOLN
INTRAVENOUS | Status: AC
  Filled 2011-01-16: qty 1

## 2011-01-16 NOTE — Progress Notes (Signed)
  I was present at this session.  I have reviewed the session itself and made appropriate changes.  Batool Majid L 11/6/20128:41 AM

## 2011-01-16 NOTE — Progress Notes (Signed)
Inpatient Diabetes Program Recommendations  AACE/ADA: New Consensus Statement on Inpatient Glycemic Control (2009)  Target Ranges:  Prepandial:   less than 140 mg/dL      Peak postprandial:   less than 180 mg/dL (1-2 hours)      Critically ill patients:  140 - 180 mg/dL   Reason for Visit: CBGs 11/05: 120-233-248-281 mg/dl  Elevated Postprandial CBGs- PO intake 50-65% of meals  Inpatient Diabetes Program Recommendations Insulin - Meal Coverage: Add Novolog Meal Coverage- Novolog 3 units tid with meals (if postprandial CBGs stay elevated)  Note:

## 2011-01-16 NOTE — Progress Notes (Signed)
Occupational Therapy Cancel  Pt not in room up on OT checking on pt for OT eval.  Will check on pt next day.  Patient Details Name: Ebony [name] MRN: [id number] DOB: 11/24/1929 Today's Date: 01/16/2011      Kirt Boys  01/16/2011, 2:22 PM

## 2011-01-16 NOTE — Progress Notes (Signed)
Subjective: Interval History: has complaints Poor appetite.  Objective: Vital signs in last 24 hours: Temp:  [98.3 F (36.8 C)-98.4 F (36.9 C)] 98.4 F (36.9 C) (11/06 0700) Pulse Rate:  [51-63] 54  (11/06 0800) Resp:  [20-22] 22  (11/06 0700) BP: (111-132)/(51-66) 112/53 mmHg (11/06 0800) SpO2:  [91 %-100 %] 100 % (11/06 0800) Weight:  [70.1 kg (154 lb 8.7 oz)] 154 lb 8.7 oz (70.1 kg) (11/06 0704) Weight change: 0 kg (0 lb)  Intake/Output from previous day: 11/05 0701 - 11/06 0700 In: 840 [P.O.:840] Out: -  Intake/Output this shift:    General appearance: alert, cooperative and syndromic appearance - facial edema Resp: rales bibasilar Cardio: irregularly irregular rhythm, S1: normal, S2: normal and systolic murmur: systolic ejection 2/6, decrescendo at 2nd left intercostal space GI: live down 4 cm, pos BS Extremities: contracture L arm, wound R FA, staples ok.  R IJ cath   Lab Results:  Basename 01/16/11 0753 01/14/11 0500  WBC 4.7 5.5  HGB 8.1* 8.5*  HCT 25.1* 26.1*  PLT 130* 81*   BMET:  Basename 01/16/11 0754  NA 130*  K 4.0  CL 92*  CO2 28  GLUCOSE 158*  BUN 37*  CREATININE 8.11*  CALCIUM 8.9   No results found for this basename: PTH:2 in the last 72 hours Iron Studies:  Basename 01/13/11 0955  IRON 67  TIBC 187*  TRANSFERRIN --  FERRITIN --    Studies/Results: No results found.  I have reviewed the patient's current medications.  Assessment/Plan: 1 ESRD for HD 2 afib rate controlled 3DM good control 4 CVA stable 5 Anemia mild decrease, cont EPO 6Infx AVG on AB  P:  HD, AB, local care, DM control    LOS: 5 days   Chandrika Sandles L 01/16/2011,8:31 AM

## 2011-01-16 NOTE — Progress Notes (Signed)
ANTIBIOTIC CONSULT NOTE - FOLLOW UP  Pharmacy Consult for Vancomycin Indication:MRSA Sepsis/AVG infection  Allergies  Allergen Reactions  . Aspirin   . Heparin   . Minoxidil   . Penicillins     Patient Measurements: Height: 5' 6.5" (168.9 cm) (Entered for [employer]) Weight: 154 lb 8.7 oz (70.1 kg) (bed scale) IBW/kg (Calculated) : 60.45    Vital Signs: Temp: 98.3 F (36.8 C) (11/05 2100) Temp src: Oral (11/05 2100) BP: 111/51 mmHg (11/05 2100) Pulse Rate: 51  (11/05 2100) Intake/Output from previous day: 11/05 0701 - 11/06 0700 In: 840 [P.O.:840] Out: -  Intake/Output from this shift:    Labs:  Basename 01/14/11 0500 01/13/11 0456  WBC 5.5 5.2  HGB 8.5* 8.9*  PLT 81* 124*  LABCREA -- --  CREATININE -- 8.65*   Estimated Creatinine Clearance: 5.1 ml/min (by C-G formula based on Cr of 8.65).   Microbiology: Recent Results (from the past 720 hour(s))  SURGICAL PCR SCREEN     Status: Normal   Collection Time   01/11/11 12:25 PM      Component Value Range Status Comment   MRSA, PCR NEGATIVE  NEGATIVE  Final    Staphylococcus aureus NEGATIVE  NEGATIVE  Final   WOUND CULTURE     Status: Normal   Collection Time   01/12/11  2:50 PM      Component Value Range Status Comment   Specimen Description WOUND ARM RIGHT   Final    Special Requests ARTERIO VENOUS GRAFT PT ON VANCOMYCIN   Final    Gram Stain     Final    Value: FEW WBC PRESENT, PREDOMINANTLY PMN     NO SQUAMOUS EPITHELIAL CELLS SEEN     NO ORGANISMS SEEN   Culture     Final    Value: FEW METHICILLIN RESISTANT STAPHYLOCOCCUS AUREUS     Note: RIFAMPIN AND GENTAMICIN SHOULD NOT BE USED AS SINGLE DRUGS FOR TREATMENT OF STAPH INFECTIONS. CRITICAL RESULT CALLED TO, READ BACK BY AND VERIFIED WITH: NADINE WALLINGTON 01/15/11 AT 1050AM BY [facility]   Report Status 01/15/2011 FINAL   Final    Organism ID, Bacteria METHICILLIN RESISTANT STAPHYLOCOCCUS AUREUS   Final     Anti-infectives     Start     Dose/Rate Route  Frequency Ordered Stop   01/16/11 1200   vancomycin (VANCOCIN) 750 mg in sodium chloride 0.9 % 150 mL IVPB  Status:  Discontinued        750 mg 150 mL/hr over 60 Minutes Intravenous Every Tue (Hemodialysis) 01/14/11 0946 01/16/11 0139   01/14/11 0945   vancomycin (VANCOCIN) 1,500 mg in sodium chloride 0.9 % 500 mL IVPB        1,500 mg 250 mL/hr over 120 Minutes Intravenous STAT 01/14/11 0944 01/14/11 1145          Assessment: POD #4 s/p exc infected R arm AVG. Pt received loading dose of Vancomycin on 11/4. Random pre-HD vanco level this a.m. = 38.6 mcg/ml (supratherapeutic). Goal pre-HD level 15-25 mcg/ml. No HD yet this admission.    Goal of Therapy:  Pre-Dialysis levels 15-25 mcg/ml Plan:  1. D/C current post-HD vancomycin dose. 2. If pt tolerates HD, vancomycin should be able to be resumed after 2 sessions (expect about 1/2 of vanco to be cleared with each HD) 3. Will f/u HD tolerance and restart vancomycin when appropriate  Chrisean Kloth, Hilario Quarry 01/16/2011,1:41 AM

## 2011-01-17 ENCOUNTER — Encounter: Payer: Self-pay | Admitting: Vascular Surgery

## 2011-01-17 LAB — POCT I-STAT 4, (NA,K, GLUC, HGB,HCT)
HCT: 18 % — ABNORMAL LOW (ref 36.0–46.0)
Hemoglobin: 6.1 g/dL — CL (ref 12.0–15.0)
Hemoglobin: 8.2 g/dL — ABNORMAL LOW (ref 12.0–15.0)
Potassium: 3.5 mEq/L (ref 3.5–5.1)
Sodium: 138 mEq/L (ref 135–145)

## 2011-01-17 LAB — GLUCOSE, CAPILLARY: Glucose-Capillary: 230 mg/dL — ABNORMAL HIGH (ref 70–99)

## 2011-01-17 NOTE — Progress Notes (Signed)
Pt's son was called and notified that his [facility] was [facility] and that the ambulance would not be able to transport her wheel chair and other belongings and he stated that he was out of town and his wife would not be able to do anything about it. So he is leaving it up to the hospital staff to take care of it. Pt's d/c information was given to ambulance driver to take with her to facility.

## 2011-01-17 NOTE — Progress Notes (Signed)
OT spoke with patient at length.  Patient receives assistance with all ADLs at SNF.  OT to defer OT eval and tx to next level of care.

## 2011-01-17 NOTE — Progress Notes (Signed)
  Subjective: Interval History: has complaints pain in right arm.  Objective: Vital signs in last 24 hours: Temp:  [97.5 F (36.4 C)-98.9 F (37.2 C)] 98.6 F (37 C) (11/07 0615) Pulse Rate:  [43-98] 67  (11/07 0615) Resp:  [13-20] 19  (11/07 0615) BP: (87-148)/(34-83) 148/78 mmHg (11/07 0615) SpO2:  [94 %-100 %] 99 % (11/07 0615) Weight:  [67.4 kg (148 lb 9.4 oz)-68 kg (149 lb 14.6 oz)] 149 lb 14.6 oz (68 kg) (11/06 2100) Weight change: 0 kg (0 lb)  Intake/Output from previous day: 11/06 0701 - 11/07 0700 In: 220 [P.O.:220] Out: 2848  Intake/Output this shift:    General appearance: alert but agitated GI: soft, non-tender; bowel sounds normal; no masses,  no organomegaly Extremities: extremities normal, atraumatic, no cyanosis or edema, left arm contrature Neurologic: Grossly normal  Lab Results:  Basename 01/16/11 0753  WBC 4.7  HGB 8.1*  HCT 25.1*  PLT 130*   BMET:  Basename 01/16/11 0754  NA 130*  K 4.0  CL 92*  CO2 28  GLUCOSE 158*  BUN 37*  CREATININE 8.11*  CALCIUM 8.9   No results found for this basename: PTH:2 in the last 72 hours Iron Studies: No results found for this basename: IRON,TIBC,TRANSFERRIN,FERRITIN in the last 72 hours Studies/Results: No results found.  I have reviewed the patient's current medications.  Assessment/Plan: ESRD Infected Right Arm AVG, s/p Removal  Plan HD 11/8    LOS: 6 days   Astra Gregg C 01/17/2011,8:56 AM

## 2011-01-17 NOTE — Progress Notes (Signed)
Pt is ready for discharge today to [facility] [place]. CSW spoke with pt's son, and he was agreeable to discharge plan. CSW met with pt, and she is agreeable to discharge plan as well. [facility] [place] is ready to accept to the facility. Discharge summary has been sent to the facility and they confirm that it was received. [facility] will providing transportation to facility. CSW signing off.   Dede Query, MSW, LCSW [phone]

## 2011-01-17 NOTE — Progress Notes (Signed)
Physical Therapy Treatment Patient Details Name: Ebony [name] MRN: [id number] DOB: 01-13-1930 Today's Date: 01/17/2011  PT Assessment/Plan  PT - Assessment/Plan PT Plan: Frequency remains appropriate PT Frequency: Min 2X/week Follow Up Recommendations: Skilled nursing facility Equipment Recommended: None recommended by PT PT Goals  Acute Rehab PT Goals PT Goal Formulation: With patient Time For Goal Achievement: 2 weeks Pt will go Supine/Side to Sit: with min assist PT Goal: Supine/Side to Sit - Progress: Progressing toward goal Pt will go Sit to Supine/Side: with min assist PT Goal: Sit to Supine/Side - Progress: Progressing toward goal Pt will Transfer Sit to Stand/Stand to Sit: with min assist PT Transfer Goal: Sit to Stand/Stand to Sit - Progress: Progressing toward goal Pt will Transfer Bed to Chair/Chair to Bed: with min assist PT Transfer Goal: Bed to Chair/Chair to Bed - Progress: Progressing toward goal Pt will Ambulate: 1 - 15 feet;with mod assist (with hemiwalker) PT Goal: Ambulate - Progress: Progressing toward goal  PT Treatment Precautions/Restrictions  Precautions Precautions: Other (comment) Precaution Comments: contact precautions Required Braces or Orthoses: No Restrictions Weight Bearing Restrictions: No Mobility (including Balance) Bed Mobility Bed Mobility: Yes Supine to Sit: 2: Max assist Supine to Sit Details (indicate cue type and reason): cues for technique; assist for bil LE management; Assist for trunk mobility. Transfers Transfers: Yes Sit to Stand: 2: Max assist;From elevated surface;With upper extremity assist;With armrests;From bed;From chair/3-in-1 Sit to Stand Details (indicate cue type and reason): blocking bil feet to prevent sliding; assist for trunk and hip extension and anterior wt shift. cues for hand placement Stand to Sit: 2: Max assist;With upper extremity assist;With armrests;To chair/3-in-1 Stand to Sit Details: assist for  controlled descent to chair.   Stand Pivot Transfers: 2: Max Actuary Details (indicate cue type and reason): assist to position Lt LE,; assist for standing balance and to initiate pivot.   Ambulation/Gait Ambulation/Gait: No Stairs: No Wheelchair Mobility Wheelchair Mobility: No  Posture/Postural Control Posture/Postural Control: Postural limitations Postural Limitations: pt leans to right in sitting. Balance Balance Assessed: Yes Static Sitting Balance Static Sitting - Balance Support: Right upper extremity supported Static Sitting - Level of Assistance: 3: Mod assist Static Sitting - Comment/# of Minutes: approximately 5 min Exercise    End of Session PT - End of Session Equipment Utilized During Treatment: Gait belt Activity Tolerance: Patient tolerated treatment well Patient left: in chair Nurse Communication: Mobility status for transfers General Behavior During Session: [facility] for tasks performed Cognition: Impaired Cognitive Impairment: memory, and problem solving  Ebony [name] 01/17/2011, 10:44 AM

## 2011-01-17 NOTE — Progress Notes (Signed)
   VASCULAR AND VEIN SURGERY PROGRESS NOTE  POST-OP HEMODIALYSIS ACCESS  Procedure OPERATIONS:   1. Bilateral ultrasound localization internal jugular veins.   2. Insertion of Diatek catheter via right internal jugular vein (23       cm).   3. Removal of AV Gore-Tex graft, right forearm with repair of right       brachial artery, over-sewing stump of Gore-Tex graft.     :Surgeon: Betti Cruz, MD  HPI: Ebony [name] is a 77 y.o. female who is 4 days S/P removal of right upper extremity Hemodialysis access. The patient denies symptoms of numbness, tingling; denies pain in the operative limb.     I/O last 3 completed shifts: In: 220 [P.O.:220] Out: 2848 [Other:2848]  Physical Examination  Patient Vitals for the past 24 hrs:  BP Temp Temp src Pulse Resp SpO2 Weight  01/17/11 0615 148/78 mmHg 98.6 F (37 C) Oral 67  19  99 % -  01/16/11 2100 137/73 mmHg 98.4 F (36.9 C) Oral 64  20  94 % 149 lb 14.6 oz (68 kg)  01/16/11 1731 129/81 mmHg 98.9 F (37.2 C) Oral 98  20  98 % -  01/16/11 1400 148/83 mmHg 97.5 F (36.4 C) Oral 82  18  100 % -  01/16/11 1216 100/39 mmHg 98.4 F (36.9 C) Oral 58  18  100 % 148 lb 9.4 oz (67.4 kg)  01/16/11 1142 88/40 mmHg - - 48  19  100 % -  01/16/11 1129 87/34 mmHg - - 48  13  100 % -  01/16/11 1100 99/47 mmHg - - 51  20  100 % -  01/16/11 1030 112/46 mmHg - - 53  19  99 % -  01/16/11 1000 105/45 mmHg - - 51  15  100 % -  01/16/11 0930 115/53 mmHg - - 52  16  100 % -  01/16/11 0923 110/41 mmHg - - 43  13  99 % -    Patient doing well. Decreased pain in R UE.On exam 3+ pulse palpable in Radial artery. Good motion and sensation.  Assessment/Plan Ebony [name] is a 77 y.o. year old female who presents s/p removal of right upper extremity Hemodialysis access and placement of Diatek catheter  she is ready for DC to SNF today. Follow-up in 2 weeks with Dr Hart Rochester, MD Della Goo J 01/17/2011 9:05 AM

## 2011-01-17 NOTE — Progress Notes (Signed)
CSW received verbal referral on 01/16/11 at 1730. Evening CSW assessed pt at 2130 on 01/16/11. For further information, please see Clinical Social Worker's Brief Psychosocial Assessment in pt's physical chart. Pt is from [facility]. This CSW spoke with liason with [facility] and they are able to accept pt. CSW completed FL2 and sent facility needed information. CSW will continue to follow to facilitate pt's discharge.   Dede Query, MSW, [facility] [phone]

## 2011-01-24 ENCOUNTER — Encounter: Payer: Self-pay | Admitting: Vascular Surgery

## 2011-01-29 ENCOUNTER — Encounter: Payer: Self-pay | Admitting: Vascular Surgery

## 2011-01-30 ENCOUNTER — Encounter: Payer: Self-pay | Admitting: Vascular Surgery

## 2011-01-30 ENCOUNTER — Ambulatory Visit (INDEPENDENT_AMBULATORY_CARE_PROVIDER_SITE_OTHER): Payer: Medicare Other | Admitting: Vascular Surgery

## 2011-01-30 ENCOUNTER — Other Ambulatory Visit (INDEPENDENT_AMBULATORY_CARE_PROVIDER_SITE_OTHER): Payer: Medicare Other | Admitting: *Deleted

## 2011-01-30 ENCOUNTER — Other Ambulatory Visit: Payer: Self-pay

## 2011-01-30 ENCOUNTER — Ambulatory Visit: Payer: Medicare Other | Admitting: Vascular Surgery

## 2011-01-30 VITALS — BP 155/96 | HR 61 | Temp 97.8°F | Resp 16 | Ht 66.0 in

## 2011-01-30 DIAGNOSIS — Z01810 Encounter for preprocedural cardiovascular examination: Secondary | ICD-10-CM

## 2011-01-30 DIAGNOSIS — N186 End stage renal disease: Secondary | ICD-10-CM

## 2011-01-30 NOTE — Progress Notes (Signed)
Subjective:     Patient ID: Ebony [name], female   DOB: 04/24/1929, 77 y.o.   MRN: [id number]  HPI this 77-year-old female is seen today in followup regarding removal of an infected right forearm AV graft. She also has had previous removal of a segment of left upper arm AV graft removed which was infected. She currently has a hemodialysis catheter in the right internal jugular vein. She has had no chills and fever since discharge from the hospital.  Past Medical History  Diagnosis Date  . Atrial fibrillation   . Chronic kidney disease   . CAD (coronary artery disease)   . Diabetes mellitus   . Hypertension   . Anemia   . Dementia   . Stroke   . MRSA bacteremia   . History of pneumonia   . Cellulitis   . GERD (gastroesophageal reflux disease)   . Acetabulum fracture     History  Substance Use Topics  . Smoking status: Never Smoker   . Smokeless tobacco: Not on file  . Alcohol Use: No    Family History  Problem Relation Age of Onset  . Diabetes Mother   . Hypertension Father     Allergies  Allergen Reactions  . Aspirin   . Heparin   . Minoxidil   . Penicillins     Current outpatient prescriptions:amLODipine (NORVASC) 10 MG tablet, Take 10 mg by mouth at bedtime.  , Disp: , Rfl: ;  b complex-vitamin c-folic acid (NEPHRO-VITE) 0.8 MG TABS, Take 1 tablet by mouth at bedtime.  , Disp: , Rfl: ;  bisacodyl (DULCOLAX) 5 MG EC tablet, Take 10 mg by mouth daily as needed. For constipation , Disp: , Rfl: ;  calcium acetate (PHOSLO) 667 MG capsule, Take 667 mg by mouth 3 (three) times daily with meals. , Disp: , Rfl:  calcium carbonate, dosed in mg elemental calcium, 1250 MG/5ML, Take 5 mLs (500 mg of elemental calcium total) by mouth every 6 (six) hours as needed., Disp: 450 mL, Rfl: 0;  docusate sodium (COLACE) 100 MG capsule, Take 100 mg by mouth 2 (two) times daily. , Disp: , Rfl: ;  DULoxetine (CYMBALTA) 20 MG capsule, Take 20 mg by mouth daily. , Disp: , Rfl:  insulin  glargine (LANTUS) 100 UNIT/ML injection, Inject 5 Units into the skin at bedtime. , Disp: , Rfl: ;  lidocaine-prilocaine (EMLA) cream, Apply 1 application topically 3 (three) times a week. Tues, thurs, and sat 30 mins before dialysis, Disp: , Rfl: ;  metoprolol (LOPRESSOR) 50 MG tablet, Take 50 mg by mouth 4 (four) times a week. Sunday, Monday, Wednesday, Friday, Disp: , Rfl:  metoprolol tartrate (LOPRESSOR) 25 MG tablet, Take 25 mg by mouth 4 (four) times a week. Sunday, Monday, Wednesday, Friday, Disp: , Rfl: ;  multivitamin (RENA-VIT) TABS tablet, Take 1 tablet by mouth at bedtime., Disp: 30 tablet, Rfl: 5;  Nutritional Supplements (FEEDING SUPPLEMENT, NEPRO CARB STEADY,) LIQD, Take 237 mLs by mouth daily., Disp: 237 mL, Rfl: 30;  omeprazole (PRILOSEC) 20 MG capsule, Take 20 mg by mouth daily. , Disp: , Rfl:  simvastatin (ZOCOR) 20 MG tablet, Take 20 mg by mouth at bedtime. , Disp: , Rfl: ;  traZODone (DESYREL) 50 MG tablet, Take 25 mg by mouth at bedtime. , Disp: , Rfl: ;  warfarin (COUMADIN) 5 MG tablet, Take 5 mg by mouth every evening.  , Disp: , Rfl:   BP 155/96  Pulse 61  Temp(Src) 97.8 F (36.6 C) (Oral)    Resp 16  Ht 5' 6" (1.676 m)  SpO2 98%  There is no weight on file to calculate BMI.        Review of Systems has history of right brain stroke. This resulted in the left hemiparesthesias is not ambulate. Denies chest pain, dyspnea on exertion, PND, orthopnea, hemoptysis.     Objective:   Physical Exam blood pressure 125/96 heart rate 61 respirations 16 General she is a chronically ill-appearing female in a wheelchair with flexion contractures of her left upper extremity. Chest no rhonchi or wheezing Cardiovascular regular rhythm no murmurs carotid pulses 3+ no bruits Abdomen soft nontender with no masses Rate upper extremity has skin staples in place were AV graft was removed. These were removed today. She has a well-perfused right hand. Lower extremity exam reveals 3+  femoral pulses bilaterally. ABIs were obtained and are essentially normal in both lower extremities. There are however monophasic waveforms suggesting occlusive disease.    Assessment:     End-stage renal disease needs dialysis access Plan insertion right thigh AV graft under general anesthesia on Wednesday, November 28    Plan:     AV access scheduled for Wednesday, November 20      

## 2011-01-31 ENCOUNTER — Encounter (HOSPITAL_COMMUNITY): Payer: Self-pay | Admitting: Pharmacy Technician

## 2011-02-06 ENCOUNTER — Other Ambulatory Visit: Payer: Self-pay

## 2011-02-06 ENCOUNTER — Encounter (HOSPITAL_COMMUNITY): Payer: Self-pay | Admitting: *Deleted

## 2011-02-06 MED ORDER — VANCOMYCIN HCL IN DEXTROSE 1-5 GM/200ML-% IV SOLN
1000.0000 mg | INTRAVENOUS | Status: DC
  Filled 2011-02-06: qty 200

## 2011-02-06 MED ORDER — SODIUM CHLORIDE 0.9 % IV SOLN: INTRAVENOUS | Status: DC

## 2011-02-06 NOTE — Progress Notes (Signed)
Pt is in [facility] nursing home, has dementia, status post stroke and non-ambulatory. Spoke with son Ebony [name] who states he is in [place] now, but has given consent over the phone for prior procedures, will be available by phone tomorrow [phone].

## 2011-02-07 ENCOUNTER — Encounter (HOSPITAL_COMMUNITY)
Admission: RE | Disposition: A | Discharge status: Skilled Nursing Facility | Payer: Self-pay | Source: Ambulatory Visit | Attending: Vascular Surgery

## 2011-02-07 ENCOUNTER — Encounter (HOSPITAL_COMMUNITY): Payer: Self-pay | Admitting: Anesthesiology

## 2011-02-07 ENCOUNTER — Encounter (HOSPITAL_COMMUNITY): Payer: Self-pay | Admitting: *Deleted

## 2011-02-07 ENCOUNTER — Ambulatory Visit (HOSPITAL_COMMUNITY)
Admission: RE | Admit: 2011-02-07 | Discharge: 2011-02-07 | Disposition: A | Discharge status: Skilled Nursing Facility | Payer: Medicare Other | Source: Ambulatory Visit | Attending: Vascular Surgery | Admitting: Vascular Surgery

## 2011-02-07 ENCOUNTER — Other Ambulatory Visit: Payer: Self-pay

## 2011-02-07 DIAGNOSIS — Z538 Procedure and treatment not carried out for other reasons: Secondary | ICD-10-CM | POA: Insufficient documentation

## 2011-02-07 DIAGNOSIS — Z01810 Encounter for preprocedural cardiovascular examination: Secondary | ICD-10-CM | POA: Insufficient documentation

## 2011-02-07 DIAGNOSIS — L89309 Pressure ulcer of unspecified buttock, unspecified stage: Secondary | ICD-10-CM

## 2011-02-07 DIAGNOSIS — Z01812 Encounter for preprocedural laboratory examination: Secondary | ICD-10-CM | POA: Insufficient documentation

## 2011-02-07 DIAGNOSIS — N186 End stage renal disease: Secondary | ICD-10-CM | POA: Insufficient documentation

## 2011-02-07 HISTORY — DX: Pressure ulcer of unspecified buttock, unspecified stage: L89.309

## 2011-02-07 HISTORY — DX: Contracture, unspecified elbow: M24.529

## 2011-02-07 LAB — APTT: aPTT: 65 seconds — ABNORMAL HIGH (ref 24–37)

## 2011-02-07 LAB — GLUCOSE, CAPILLARY
Glucose-Capillary: 116 mg/dL — ABNORMAL HIGH (ref 70–99)
Glucose-Capillary: 90 mg/dL (ref 70–99)

## 2011-02-07 LAB — POCT I-STAT 4, (NA,K, GLUC, HGB,HCT)
HCT: 52 % — ABNORMAL HIGH (ref 36.0–46.0)
Sodium: 139 mEq/L (ref 135–145)

## 2011-02-07 SURGERY — INSERTION OF ARTERIOVENOUS (AV) GORE-TEX GRAFT THIGH
Anesthesia: General | Laterality: Right

## 2011-02-07 MED ORDER — PHYTONADIONE 5 MG PO TABS
5.0000 mg | ORAL_TABLET | Freq: Once | ORAL | Status: AC
  Administered 2011-02-07: 5 mg via ORAL
  Filled 2011-02-07: qty 1

## 2011-02-07 MED ORDER — PHYTONADIONE 5 MG PO TABS
5.0000 mg | ORAL_TABLET | Freq: Once | ORAL | Status: DC
  Filled 2011-02-07: qty 1

## 2011-02-07 SURGICAL SUPPLY — 28 items
CANISTER SUCTION 2500CC (MISCELLANEOUS) ×2 IMPLANT
CLIP TI MEDIUM 6 (CLIP) ×2 IMPLANT
CLIP TI WIDE RED SMALL 6 (CLIP) ×2 IMPLANT
CLOTH BEACON ORANGE TIMEOUT ST (SAFETY) ×2 IMPLANT
COVER SURGICAL LIGHT HANDLE (MISCELLANEOUS) ×4 IMPLANT
DERMABOND ADVANCED (GAUZE/BANDAGES/DRESSINGS) ×1
DERMABOND ADVANCED .7 DNX12 (GAUZE/BANDAGES/DRESSINGS) ×1 IMPLANT
ELECT REM PT RETURN 9FT ADLT (ELECTROSURGICAL) ×2
ELECTRODE REM PT RTRN 9FT ADLT (ELECTROSURGICAL) ×1 IMPLANT
GEL ULTRASOUND 20GR AQUASONIC (MISCELLANEOUS) IMPLANT
GLOVE SS BIOGEL STRL SZ 7 (GLOVE) ×1 IMPLANT
GLOVE SUPERSENSE BIOGEL SZ 7 (GLOVE) ×1
GOWN STRL NON-REIN LRG LVL3 (GOWN DISPOSABLE) ×4 IMPLANT
KIT BASIN OR (CUSTOM PROCEDURE TRAY) ×2 IMPLANT
KIT ROOM TURNOVER OR (KITS) ×2 IMPLANT
NS IRRIG 1000ML POUR BTL (IV SOLUTION) ×2 IMPLANT
PACK CV ACCESS (CUSTOM PROCEDURE TRAY) ×2 IMPLANT
PAD ARMBOARD 7.5X6 YLW CONV (MISCELLANEOUS) ×2 IMPLANT
SPONGE GAUZE 4X4 12PLY (GAUZE/BANDAGES/DRESSINGS) ×2 IMPLANT
SUT PROLENE 6 0 BV (SUTURE) ×4 IMPLANT
SUT VIC AB 2-0 CT1 27 (SUTURE) ×1
SUT VIC AB 2-0 CT1 TAPERPNT 27 (SUTURE) ×1 IMPLANT
SUT VIC AB 3-0 SH 27 (SUTURE) ×2
SUT VIC AB 3-0 SH 27X BRD (SUTURE) ×2 IMPLANT
TOWEL OR 17X24 6PK STRL BLUE (TOWEL DISPOSABLE) ×2 IMPLANT
TOWEL OR 17X26 10 PK STRL BLUE (TOWEL DISPOSABLE) ×2 IMPLANT
UNDERPAD 30X30 INCONTINENT (UNDERPADS AND DIAPERS) ×2 IMPLANT
WATER STERILE IRR 1000ML POUR (IV SOLUTION) ×2 IMPLANT

## 2011-02-08 MED FILL — Vancomycin HCl For IV Soln 1 GM (Base Equivalent): INTRAVENOUS | Qty: 2000 | Status: AC

## 2011-02-09 ENCOUNTER — Encounter (HOSPITAL_COMMUNITY): Payer: Self-pay | Admitting: *Deleted

## 2011-02-09 NOTE — Progress Notes (Signed)
[facility] nursing home pt, [phone], confirmed pre-surgery instructions with nurse [facility]. Pt has not taken coumadin since 11/26.

## 2011-02-11 MED ORDER — VANCOMYCIN HCL IN DEXTROSE 1-5 GM/200ML-% IV SOLN
1000.0000 mg | INTRAVENOUS | Status: AC
  Administered 2011-02-12: 1000 mg via INTRAVENOUS
  Filled 2011-02-11: qty 200

## 2011-02-12 ENCOUNTER — Inpatient Hospital Stay (HOSPITAL_COMMUNITY)
Admission: RE | Admit: 2011-02-12 | Discharge: 2011-02-13 | DRG: 673 | Disposition: A | Discharge status: Skilled Nursing Facility | Payer: Medicare Other | Source: Ambulatory Visit | Attending: Vascular Surgery | Admitting: Vascular Surgery

## 2011-02-12 ENCOUNTER — Encounter (HOSPITAL_COMMUNITY)
Admission: RE | Disposition: A | Discharge status: Skilled Nursing Facility | Payer: Self-pay | Source: Ambulatory Visit | Attending: Vascular Surgery

## 2011-02-12 ENCOUNTER — Encounter (HOSPITAL_COMMUNITY): Payer: Self-pay

## 2011-02-12 ENCOUNTER — Encounter (HOSPITAL_COMMUNITY): Payer: Self-pay | Admitting: *Deleted

## 2011-02-12 ENCOUNTER — Ambulatory Visit (HOSPITAL_COMMUNITY): Payer: Medicare Other

## 2011-02-12 DIAGNOSIS — Z880 Allergy status to penicillin: Secondary | ICD-10-CM

## 2011-02-12 DIAGNOSIS — Z7901 Long term (current) use of anticoagulants: Secondary | ICD-10-CM

## 2011-02-12 DIAGNOSIS — Z794 Long term (current) use of insulin: Secondary | ICD-10-CM

## 2011-02-12 DIAGNOSIS — Z888 Allergy status to other drugs, medicaments and biological substances status: Secondary | ICD-10-CM

## 2011-02-12 DIAGNOSIS — Z79899 Other long term (current) drug therapy: Secondary | ICD-10-CM

## 2011-02-12 DIAGNOSIS — N2581 Secondary hyperparathyroidism of renal origin: Secondary | ICD-10-CM | POA: Diagnosis present

## 2011-02-12 DIAGNOSIS — L8990 Pressure ulcer of unspecified site, unspecified stage: Secondary | ICD-10-CM | POA: Diagnosis present

## 2011-02-12 DIAGNOSIS — Z992 Dependence on renal dialysis: Secondary | ICD-10-CM

## 2011-02-12 DIAGNOSIS — L89309 Pressure ulcer of unspecified buttock, unspecified stage: Secondary | ICD-10-CM | POA: Diagnosis present

## 2011-02-12 DIAGNOSIS — D631 Anemia in chronic kidney disease: Secondary | ICD-10-CM | POA: Diagnosis present

## 2011-02-12 DIAGNOSIS — I4891 Unspecified atrial fibrillation: Secondary | ICD-10-CM | POA: Diagnosis present

## 2011-02-12 DIAGNOSIS — N186 End stage renal disease: Secondary | ICD-10-CM

## 2011-02-12 DIAGNOSIS — I2510 Atherosclerotic heart disease of native coronary artery without angina pectoris: Secondary | ICD-10-CM | POA: Diagnosis present

## 2011-02-12 DIAGNOSIS — K219 Gastro-esophageal reflux disease without esophagitis: Secondary | ICD-10-CM | POA: Diagnosis present

## 2011-02-12 DIAGNOSIS — Z833 Family history of diabetes mellitus: Secondary | ICD-10-CM

## 2011-02-12 DIAGNOSIS — I69959 Hemiplegia and hemiparesis following unspecified cerebrovascular disease affecting unspecified side: Secondary | ICD-10-CM

## 2011-02-12 DIAGNOSIS — E119 Type 2 diabetes mellitus without complications: Secondary | ICD-10-CM | POA: Diagnosis present

## 2011-02-12 DIAGNOSIS — Z8249 Family history of ischemic heart disease and other diseases of the circulatory system: Secondary | ICD-10-CM

## 2011-02-12 DIAGNOSIS — F0390 Unspecified dementia without behavioral disturbance: Secondary | ICD-10-CM | POA: Diagnosis present

## 2011-02-12 DIAGNOSIS — I120 Hypertensive chronic kidney disease with stage 5 chronic kidney disease or end stage renal disease: Principal | ICD-10-CM | POA: Diagnosis present

## 2011-02-12 HISTORY — PX: AV FISTULA PLACEMENT: SHX1204

## 2011-02-12 LAB — POCT I-STAT 4, (NA,K, GLUC, HGB,HCT)
HCT: 54 % — ABNORMAL HIGH (ref 36.0–46.0)
Hemoglobin: 18.4 g/dL — ABNORMAL HIGH (ref 12.0–15.0)
Potassium: 4.7 mEq/L (ref 3.5–5.1)
Sodium: 141 mEq/L (ref 135–145)

## 2011-02-12 LAB — SURGICAL PCR SCREEN
MRSA, PCR: NEGATIVE
Staphylococcus aureus: NEGATIVE

## 2011-02-12 LAB — APTT: aPTT: 34 seconds (ref 24–37)

## 2011-02-12 LAB — GLUCOSE, CAPILLARY
Glucose-Capillary: 191 mg/dL — ABNORMAL HIGH (ref 70–99)
Glucose-Capillary: 199 mg/dL — ABNORMAL HIGH (ref 70–99)

## 2011-02-12 LAB — PROTIME-INR: INR: 1.03 (ref 0.00–1.49)

## 2011-02-12 SURGERY — INSERTION OF ARTERIOVENOUS (AV) GORE-TEX GRAFT THIGH
Anesthesia: General | Site: Thigh | Laterality: Right | Wound class: Clean

## 2011-02-12 MED ORDER — SIMVASTATIN 20 MG PO TABS
20.0000 mg | ORAL_TABLET | Freq: Every day | ORAL | Status: DC
  Administered 2011-02-13: 20 mg via ORAL
  Filled 2011-02-12 (×2): qty 1

## 2011-02-12 MED ORDER — SODIUM CHLORIDE 0.9 % IV SOLN
INTRAVENOUS | Status: DC
  Administered 2011-02-12 (×2): via INTRAVENOUS

## 2011-02-12 MED ORDER — BISACODYL 5 MG PO TBEC: 10.0000 mg | DELAYED_RELEASE_TABLET | Freq: Every day | ORAL | Status: DC | PRN

## 2011-02-12 MED ORDER — CALCIUM CARBONATE 1250 MG/5ML PO SUSP
500.0000 mg | Freq: Every day | ORAL | Status: DC
  Administered 2011-02-13: 500 mg via ORAL
  Filled 2011-02-12 (×2): qty 5

## 2011-02-12 MED ORDER — ONDANSETRON HCL 4 MG/2ML IJ SOLN: 4.0000 mg | Freq: Four times a day (QID) | INTRAMUSCULAR | Status: DC | PRN

## 2011-02-12 MED ORDER — SODIUM CHLORIDE 0.9 % IV SOLN: INTRAVENOUS | Status: DC

## 2011-02-12 MED ORDER — CALCIUM ACETATE 667 MG PO CAPS
667.0000 mg | ORAL_CAPSULE | Freq: Three times a day (TID) | ORAL | Status: DC
  Administered 2011-02-12 – 2011-02-13 (×3): 667 mg via ORAL
  Filled 2011-02-12 (×5): qty 1

## 2011-02-12 MED ORDER — MAGNESIUM SULFATE 40 MG/ML IJ SOLN: 2.0000 g | Freq: Once | INTRAMUSCULAR | Status: DC | PRN

## 2011-02-12 MED ORDER — PANTOPRAZOLE SODIUM 40 MG PO TBEC
40.0000 mg | DELAYED_RELEASE_TABLET | Freq: Every day | ORAL | Status: DC
  Administered 2011-02-12 – 2011-02-13 (×2): 40 mg via ORAL
  Filled 2011-02-12 (×2): qty 1

## 2011-02-12 MED ORDER — METOPROLOL TARTRATE 50 MG PO TABS
50.0000 mg | ORAL_TABLET | ORAL | Status: DC
  Filled 2011-02-12: qty 1

## 2011-02-12 MED ORDER — LIDOCAINE-EPINEPHRINE (PF) 1 %-1:200000 IJ SOLN
INTRAMUSCULAR | Status: DC | PRN
  Administered 2011-02-12: 28 mL

## 2011-02-12 MED ORDER — MUPIROCIN 2 % EX OINT
TOPICAL_OINTMENT | CUTANEOUS | Status: AC
  Filled 2011-02-12: qty 22

## 2011-02-12 MED ORDER — POTASSIUM CHLORIDE CRYS ER 20 MEQ PO TBCR: 20.0000 meq | EXTENDED_RELEASE_TABLET | Freq: Once | ORAL | Status: DC | PRN

## 2011-02-12 MED ORDER — HYDRALAZINE HCL 20 MG/ML IJ SOLN
10.0000 mg | INTRAMUSCULAR | Status: DC | PRN
  Filled 2011-02-12: qty 0.5

## 2011-02-12 MED ORDER — TRAZODONE 25 MG HALF TABLET
25.0000 mg | ORAL_TABLET | Freq: Every day | ORAL | Status: DC
  Administered 2011-02-13: 25 mg via ORAL
  Filled 2011-02-12 (×2): qty 1

## 2011-02-12 MED ORDER — LABETALOL HCL 5 MG/ML IV SOLN
10.0000 mg | INTRAVENOUS | Status: DC | PRN
  Filled 2011-02-12: qty 4

## 2011-02-12 MED ORDER — MUPIROCIN 2 % EX OINT
TOPICAL_OINTMENT | Freq: Two times a day (BID) | CUTANEOUS | Status: DC
  Administered 2011-02-12: via NASAL

## 2011-02-12 MED ORDER — THROMBIN 20000 UNITS EX KIT
PACK | CUTANEOUS | Status: DC | PRN
  Administered 2011-02-12: 10:00:00 via TOPICAL

## 2011-02-12 MED ORDER — METOPROLOL TARTRATE 50 MG PO TABS
50.0000 mg | ORAL_TABLET | ORAL | Status: DC
  Administered 2011-02-12: 50 mg via ORAL
  Filled 2011-02-12 (×2): qty 1

## 2011-02-12 MED ORDER — WARFARIN SODIUM 6 MG PO TABS
7.0000 mg | ORAL_TABLET | Freq: Every day | ORAL | Status: DC
  Administered 2011-02-12 – 2011-02-13 (×2): 7 mg via ORAL
  Filled 2011-02-12 (×2): qty 1

## 2011-02-12 MED ORDER — ONDANSETRON HCL 4 MG/2ML IJ SOLN
INTRAMUSCULAR | Status: DC | PRN
  Administered 2011-02-12: 4 mg via INTRAVENOUS

## 2011-02-12 MED ORDER — ACETAMINOPHEN 325 MG PO TABS: 325.0000 mg | ORAL_TABLET | ORAL | Status: DC | PRN

## 2011-02-12 MED ORDER — LIDOCAINE-PRILOCAINE 2.5-2.5 % EX CREA
1.0000 "application " | TOPICAL_CREAM | CUTANEOUS | Status: DC
  Filled 2011-02-12: qty 5

## 2011-02-12 MED ORDER — OXYCODONE HCL 5 MG PO TABS
5.0000 mg | ORAL_TABLET | ORAL | Status: DC | PRN
  Administered 2011-02-12: 10 mg via ORAL
  Administered 2011-02-13: 5 mg via ORAL
  Filled 2011-02-12: qty 1
  Filled 2011-02-12: qty 2

## 2011-02-12 MED ORDER — INSULIN ASPART 100 UNIT/ML ~~LOC~~ SOLN
0.0000 [IU] | Freq: Three times a day (TID) | SUBCUTANEOUS | Status: DC
  Administered 2011-02-12: 3 [IU] via SUBCUTANEOUS
  Administered 2011-02-13: 5 [IU] via SUBCUTANEOUS
  Filled 2011-02-12 (×9): qty 3

## 2011-02-12 MED ORDER — SODIUM CHLORIDE 0.9 % IV SOLN
0.0500 mg/kg/h | INTRAVENOUS | Status: DC
  Filled 2011-02-12: qty 250

## 2011-02-12 MED ORDER — ACETAMINOPHEN 650 MG RE SUPP: 325.0000 mg | RECTAL | Status: DC | PRN

## 2011-02-12 MED ORDER — INSULIN GLARGINE 100 UNIT/ML ~~LOC~~ SOLN
5.0000 [IU] | Freq: Every day | SUBCUTANEOUS | Status: DC
  Administered 2011-02-13: 5 [IU] via SUBCUTANEOUS
  Filled 2011-02-12: qty 3

## 2011-02-12 MED ORDER — AMLODIPINE BESYLATE 10 MG PO TABS
10.0000 mg | ORAL_TABLET | Freq: Every day | ORAL | Status: DC
  Administered 2011-02-13: 10 mg via ORAL
  Filled 2011-02-12 (×2): qty 1

## 2011-02-12 MED ORDER — SODIUM CHLORIDE 0.9 % IR SOLN
Status: DC | PRN
  Administered 2011-02-12: 1000 mL

## 2011-02-12 MED ORDER — WARFARIN SODIUM 2 MG PO TABS: 2.0000 mg | ORAL_TABLET | ORAL | Status: DC

## 2011-02-12 MED ORDER — FENTANYL CITRATE 0.05 MG/ML IJ SOLN
INTRAMUSCULAR | Status: DC | PRN
  Administered 2011-02-12: 50 ug via INTRAVENOUS
  Administered 2011-02-12 (×2): 12.5 ug via INTRAVENOUS
  Administered 2011-02-12: 50 ug via INTRAVENOUS
  Administered 2011-02-12: 12.5 ug via INTRAVENOUS

## 2011-02-12 MED ORDER — SODIUM CHLORIDE 0.9 % IV SOLN
500.0000 mL | Freq: Once | INTRAVENOUS | Status: AC | PRN
  Filled 2011-02-12: qty 500

## 2011-02-12 MED ORDER — EPHEDRINE SULFATE 50 MG/ML IJ SOLN
INTRAMUSCULAR | Status: DC | PRN
  Administered 2011-02-12: 5 mg via INTRAVENOUS

## 2011-02-12 MED ORDER — WARFARIN SODIUM 5 MG PO TABS: 5.0000 mg | ORAL_TABLET | ORAL | Status: DC

## 2011-02-12 MED ORDER — RENA-VITE PO TABS
1.0000 | ORAL_TABLET | Freq: Every day | ORAL | Status: DC
  Administered 2011-02-13: 1 via ORAL
  Filled 2011-02-12 (×2): qty 1

## 2011-02-12 MED ORDER — PROPOFOL 10 MG/ML IV EMUL
INTRAVENOUS | Status: DC | PRN
  Administered 2011-02-12: 100 mg via INTRAVENOUS
  Administered 2011-02-12: 75 mg via INTRAVENOUS
  Administered 2011-02-12: 100 mg via INTRAVENOUS

## 2011-02-12 MED ORDER — MORPHINE SULFATE 2 MG/ML IJ SOLN: 2.0000 mg | INTRAMUSCULAR | Status: DC | PRN

## 2011-02-12 MED ORDER — RENA-VITE PO TABS
1.0000 | ORAL_TABLET | Freq: Every day | ORAL | Status: DC
  Administered 2011-02-12 – 2011-02-13 (×2): 1 via ORAL
  Filled 2011-02-12 (×2): qty 1

## 2011-02-12 MED ORDER — DOCUSATE SODIUM 100 MG PO CAPS
100.0000 mg | ORAL_CAPSULE | Freq: Two times a day (BID) | ORAL | Status: DC
  Administered 2011-02-12 – 2011-02-13 (×2): 100 mg via ORAL
  Filled 2011-02-12 (×3): qty 1

## 2011-02-12 MED ORDER — HYDROMORPHONE HCL PF 1 MG/ML IJ SOLN
0.2500 mg | INTRAMUSCULAR | Status: DC | PRN
  Administered 2011-02-12: 0.5 mg via INTRAVENOUS

## 2011-02-12 MED ORDER — SODIUM CHLORIDE 0.9 % IR SOLN
Status: DC | PRN
  Administered 2011-02-12: 500 mL

## 2011-02-12 SURGICAL SUPPLY — 45 items
CANISTER SUCTION 2500CC (MISCELLANEOUS) ×2 IMPLANT
CLIP TI MEDIUM 6 (CLIP) ×2 IMPLANT
CLIP TI WIDE RED SMALL 24 (CLIP) ×2 IMPLANT
CLIP TI WIDE RED SMALL 6 (CLIP) ×2 IMPLANT
CLOTH BEACON ORANGE TIMEOUT ST (SAFETY) ×2 IMPLANT
COVER SURGICAL LIGHT HANDLE (MISCELLANEOUS) ×4 IMPLANT
DERMABOND ADHESIVE PROPEN (GAUZE/BANDAGES/DRESSINGS) ×1
DERMABOND ADVANCED (GAUZE/BANDAGES/DRESSINGS) ×1
DERMABOND ADVANCED .7 DNX12 (GAUZE/BANDAGES/DRESSINGS) ×1 IMPLANT
DERMABOND ADVANCED .7 DNX6 (GAUZE/BANDAGES/DRESSINGS) ×1 IMPLANT
DRAPE INCISE IOBAN 66X45 STRL (DRAPES) ×2 IMPLANT
ELECT REM PT RETURN 9FT ADLT (ELECTROSURGICAL) ×2
ELECTRODE REM PT RTRN 9FT ADLT (ELECTROSURGICAL) ×1 IMPLANT
GLOVE BIO SURGEON STRL SZ7 (GLOVE) ×2 IMPLANT
GLOVE BIOGEL M STRL SZ7.5 (GLOVE) ×2 IMPLANT
GLOVE BIOGEL PI IND STRL 6.5 (GLOVE) ×1 IMPLANT
GLOVE BIOGEL PI IND STRL 7.0 (GLOVE) ×1 IMPLANT
GLOVE BIOGEL PI IND STRL 7.5 (GLOVE) ×2 IMPLANT
GLOVE BIOGEL PI INDICATOR 6.5 (GLOVE) ×1
GLOVE BIOGEL PI INDICATOR 7.0 (GLOVE) ×1
GLOVE BIOGEL PI INDICATOR 7.5 (GLOVE) ×2
GLOVE SS BIOGEL STRL SZ 7 (GLOVE) ×1 IMPLANT
GLOVE SUPERSENSE BIOGEL SZ 7 (GLOVE) ×1
GOWN PREVENTION PLUS XLARGE (GOWN DISPOSABLE) ×2 IMPLANT
GOWN STRL NON-REIN LRG LVL3 (GOWN DISPOSABLE) ×6 IMPLANT
GRAFT GORETEX 6X70 (Vascular Products) ×2 IMPLANT
HEMOSTAT SURGICEL 2X14 (HEMOSTASIS) IMPLANT
INSERT FOGARTY SM (MISCELLANEOUS) ×2 IMPLANT
KIT BASIN OR (CUSTOM PROCEDURE TRAY) ×2 IMPLANT
KIT ROOM TURNOVER OR (KITS) ×2 IMPLANT
NS IRRIG 1000ML POUR BTL (IV SOLUTION) ×2 IMPLANT
PACK CV ACCESS (CUSTOM PROCEDURE TRAY) ×2 IMPLANT
PAD ARMBOARD 7.5X6 YLW CONV (MISCELLANEOUS) ×4 IMPLANT
SPONGE SURGIFOAM ABS GEL 100 (HEMOSTASIS) ×2 IMPLANT
SUT GORETEX 5 0 TT13 24 (SUTURE) ×4 IMPLANT
SUT MNCRL AB 4-0 PS2 18 (SUTURE) ×4 IMPLANT
SUT PROLENE 5 0 C 1 24 (SUTURE) ×2 IMPLANT
SUT PROLENE 6 0 BV (SUTURE) ×4 IMPLANT
SUT VIC AB 2-0 CT1 27 (SUTURE) ×1
SUT VIC AB 2-0 CT1 TAPERPNT 27 (SUTURE) ×1 IMPLANT
SUT VIC AB 3-0 SH 27 (SUTURE) ×2
SUT VIC AB 3-0 SH 27X BRD (SUTURE) ×2 IMPLANT
TOWEL OR 17X24 6PK STRL BLUE (TOWEL DISPOSABLE) ×2 IMPLANT
TOWEL OR 17X26 10 PK STRL BLUE (TOWEL DISPOSABLE) ×2 IMPLANT
WATER STERILE IRR 1000ML POUR (IV SOLUTION) ×2 IMPLANT

## 2011-02-12 NOTE — Progress Notes (Signed)
Pt arrived to floor from PACU after having an graft placed in her rt thigh. She is alert and oriented x3. From [facility]. She is an dialysis pt. Has an dialysis catheter in her rt chest. Dressing dry and intact. Pt's surgical incision has an dermabond dressing present on her thigh and rt groin area. Some redness and swelling present. Pt denies any pain. On o2 @ 2L VIA [place]. VS  Stable on admission. Pt's left arm is contracted. No swelling present on lower ext's. Pt is oliguric. No distress present. Will continue to monitor.

## 2011-02-12 NOTE — Transfer of Care (Signed)
Immediate Anesthesia Transfer of Care Note  Patient: Ebony [name]  Procedure(s) Performed:  INSERTION OF ARTERIOVENOUS (AV) GORE-TEX GRAFT THIGH  Patient Location: PACU  Anesthesia Type: General  Level of Consciousness: sedated  Airway & Oxygen Therapy: Patient Spontanous Breathing and Patient connected to nasal cannula oxygen  Post-op Assessment: Report given to PACU RN, Post -op Vital signs reviewed and stable and Patient moving all extremities  Post vital signs: Reviewed and stable  Complications: No apparent anesthesia complications

## 2011-02-12 NOTE — Addendum Note (Signed)
Addendum  created 02/12/11 1118 by Germaine Pomfret, MD   Modules edited:Anesthesia Attestations

## 2011-02-12 NOTE — Anesthesia Preprocedure Evaluation (Addendum)
Anesthesia Evaluation  Patient identified by MRN, date of birth, ID band Patient awake    Reviewed: Allergy & Precautions, H&P , NPO status , Patient's Chart, lab work & pertinent test results  Airway       Dental   Pulmonary          Cardiovascular hypertension, Pt. on medications + CAD     Neuro/Psych PSYCHIATRIC DISORDERS Depression CVA, Residual Symptoms    GI/Hepatic GERD-  ,  Endo/Other  Diabetes mellitus-  Renal/GU      Musculoskeletal   Abdominal   Peds  Hematology   Anesthesia Other Findings   Reproductive/Obstetrics                          Anesthesia Physical Anesthesia Plan  ASA: IV  Anesthesia Plan: General   Post-op Pain Management:    Induction: Intravenous  Airway Management Planned: LMA  Additional Equipment:   Intra-op Plan:   Post-operative Plan:   Informed Consent: I have reviewed the patients History and Physical, chart, labs and discussed the procedure including the risks, benefits and alternatives for the proposed anesthesia with the patient or authorized representative who has indicated his/her understanding and acceptance.     Plan Discussed with: CRNA and Surgeon  Anesthesia Plan Comments:         Anesthesia Quick Evaluation

## 2011-02-12 NOTE — Progress Notes (Signed)
Pt had some beats of trigeminy and a missed beat of 1.88 secs while at rest. Vs stable. MD notified no new orders received. Will cont to monitor.

## 2011-02-12 NOTE — Addendum Note (Signed)
Addendum  created 02/12/11 1117 by Germaine Pomfret, MD   Modules edited:Anesthesia Attestations

## 2011-02-12 NOTE — Anesthesia Postprocedure Evaluation (Signed)
  Anesthesia Post-op Note  Patient: Ebony [name]  Procedure(s) Performed:  INSERTION OF ARTERIOVENOUS (AV) GORE-TEX GRAFT THIGH  Patient Location: PACU  Anesthesia Type: General  Level of Consciousness: awake  Airway and Oxygen Therapy: Patient Spontanous Breathing  Post-op Pain: none  Post-op Assessment: Post-op Vital signs reviewed  Post-op Vital Signs: stable  Complications: No apparent anesthesia complications

## 2011-02-12 NOTE — Preoperative (Signed)
Beta Blockers   Reason not to administer Beta Blockers:Hold beta blocker due to other 

## 2011-02-12 NOTE — H&P (View-Only) (Signed)
Subjective:     Patient ID: Ebony [name], female   DOB: [birth year], 77 y.o.   MRN: [id number]  HPI this 77 year old female is seen today in followup regarding removal of an infected right forearm AV graft. She also has had previous removal of a segment of left upper arm AV graft removed which was infected. She currently has a hemodialysis catheter in the right internal jugular vein. She has had no chills and fever since discharge from the hospital.  Past Medical History  Diagnosis Date  . Atrial fibrillation   . Chronic kidney disease   . CAD (coronary artery disease)   . Diabetes mellitus   . Hypertension   . Anemia   . Dementia   . Stroke   . MRSA bacteremia   . History of pneumonia   . Cellulitis   . GERD (gastroesophageal reflux disease)   . Acetabulum fracture     History  Substance Use Topics  . Smoking status: Never Smoker   . Smokeless tobacco: Not on file  . Alcohol Use: No    Family History  Problem Relation Age of Onset  . Diabetes Mother   . Hypertension Father     Allergies  Allergen Reactions  . Aspirin   . Heparin   . Minoxidil   . Penicillins     Current outpatient prescriptions:amLODipine (NORVASC) 10 MG tablet, Take 10 mg by mouth at bedtime.  , Disp: , Rfl: ;  b complex-vitamin c-folic acid (NEPHRO-VITE) 0.8 MG TABS, Take 1 tablet by mouth at bedtime.  , Disp: , Rfl: ;  bisacodyl (DULCOLAX) 5 MG EC tablet, Take 10 mg by mouth daily as needed. For constipation , Disp: , Rfl: ;  calcium acetate (PHOSLO) 667 MG capsule, Take 667 mg by mouth 3 (three) times daily with meals. , Disp: , Rfl:  calcium carbonate, dosed in mg elemental calcium, 1250 MG/5ML, Take 5 mLs (500 mg of elemental calcium total) by mouth every 6 (six) hours as needed., Disp: 450 mL, Rfl: 0;  docusate sodium (COLACE) 100 MG capsule, Take 100 mg by mouth 2 (two) times daily. , Disp: , Rfl: ;  DULoxetine (CYMBALTA) 20 MG capsule, Take 20 mg by mouth daily. , Disp: , Rfl:  insulin  glargine (LANTUS) 100 UNIT/ML injection, Inject 5 Units into the skin at bedtime. , Disp: , Rfl: ;  lidocaine-prilocaine (EMLA) cream, Apply 1 application topically 3 (three) times a week. Tues, thurs, and sat 30 mins before dialysis, Disp: , Rfl: ;  metoprolol (LOPRESSOR) 50 MG tablet, Take 50 mg by mouth 4 (four) times a week. Sunday, Monday, Wednesday, Friday, Disp: , Rfl:  metoprolol tartrate (LOPRESSOR) 25 MG tablet, Take 25 mg by mouth 4 (four) times a week. Sunday, Monday, Wednesday, Friday, Disp: , Rfl: ;  multivitamin (RENA-VIT) TABS tablet, Take 1 tablet by mouth at bedtime., Disp: 30 tablet, Rfl: 5;  Nutritional Supplements (FEEDING SUPPLEMENT, NEPRO CARB STEADY,) LIQD, Take 237 mLs by mouth daily., Disp: 237 mL, Rfl: 30;  omeprazole (PRILOSEC) 20 MG capsule, Take 20 mg by mouth daily. , Disp: , Rfl:  simvastatin (ZOCOR) 20 MG tablet, Take 20 mg by mouth at bedtime. , Disp: , Rfl: ;  traZODone (DESYREL) 50 MG tablet, Take 25 mg by mouth at bedtime. , Disp: , Rfl: ;  warfarin (COUMADIN) 5 MG tablet, Take 5 mg by mouth every evening.  , Disp: , Rfl:   BP 155/96  Pulse 61  Temp(Src) 97.8 F (36.6 C) (Oral)  Resp 16  Ht 5\' 6"  (1.676 m)  SpO2 98%  There is no weight on file to calculate BMI.        Review of Systems has history of right brain stroke. This resulted in the left hemiparesthesias is not ambulate. Denies chest pain, dyspnea on exertion, PND, orthopnea, hemoptysis.     Objective:   Physical Exam blood pressure 125/96 heart rate 61 respirations 16 General she is a chronically ill-appearing female in a wheelchair with flexion contractures of her left upper extremity. Chest no rhonchi or wheezing Cardiovascular regular rhythm no murmurs carotid pulses 3+ no bruits Abdomen soft nontender with no masses Rate upper extremity has skin staples in place were AV graft was removed. These were removed today. She has a well-perfused right hand. Lower extremity exam reveals 3+  femoral pulses bilaterally. ABIs were obtained and are essentially normal in both lower extremities. There are however monophasic waveforms suggesting occlusive disease.    Assessment:     End-stage renal disease needs dialysis access Plan insertion right thigh AV graft under general anesthesia on Wednesday, November 28    Plan:     AV access scheduled for Wednesday, November 20

## 2011-02-12 NOTE — Interval H&P Note (Signed)
--    [facility] [place]  History and Physical Update  The patient was interviewed and re-examined.  The patient's History and Physical has been reviewed and is unchanged.  There is no change in the plan of care.  Laboratory: CBC:    Component Value Date/Time   WBC 4.7 01/16/2011 0753   RBC 2.82* 01/16/2011 0753   HGB 17.7* 02/07/2011 0813   HCT 52.0* 02/07/2011 0813   PLT 130* 01/16/2011 0753   MCV 89.0 01/16/2011 0753   MCH 28.7 01/16/2011 0753   MCHC 32.3 01/16/2011 0753   RDW 15.7* 01/16/2011 0753   LYMPHSABS 1.4 05/22/2010 1651   MONOABS 0.5 05/22/2010 1651   EOSABS 0.2 05/22/2010 1651   BASOSABS 0.0 05/22/2010 1651   BMP:    Component Value Date/Time   NA 139 02/07/2011 0813   K 3.7 02/07/2011 0813   CL 92* 01/16/2011 0754   CO2 28 01/16/2011 0754   GLUCOSE 116* 02/07/2011 0813   BUN 37* 01/16/2011 0754   CREATININE 8.11* 01/16/2011 0754   CALCIUM 8.9 01/16/2011 0754   CALCIUM 8.6 07/07/2009 1725   GFRNONAA 4* 01/16/2011 0754   GFRAA 5* 01/16/2011 0754   Coagulation: Lab Results  Component Value Date   INR 2.63* 02/07/2011   INR 1.35 01/13/2011   INR 1.66* 01/12/2011   Leonides Sake, MD [facility] [place] Office: [phone] Pager: [phone]  02/12/2011, 7:12 AM

## 2011-02-12 NOTE — Anesthesia Procedure Notes (Addendum)
Date/Time: 02/12/2011 8:00 AM Performed by: Bronson Ing F   Procedure Name: LMA Insertion Date/Time: 02/12/2011 8:00 AM Performed by: Carmela Rima Pre-anesthesia Checklist: Emergency Drugs available, Patient identified, Timeout performed, Suction available and Patient being monitored Patient Re-evaluated:Patient Re-evaluated prior to inductionOxygen Delivery Method: Circle System Utilized Preoxygenation: Pre-oxygenation with 100% oxygen Intubation Type: IV induction Ventilation: Mask ventilation without difficulty LMA: LMA inserted LMA Size: 4.0 Tube type: Oral Number of attempts: 1 Placement Confirmation: positive ETCO2,  CO2 detector and breath sounds checked- equal and bilateral

## 2011-02-12 NOTE — Op Note (Addendum)
OPERATIVE NOTE   PROCEDURE:  right thigh arteriovenous graft  PRE-OPERATIVE DIAGNOSIS: end stage renal disease   POST-OPERATIVE DIAGNOSIS: same as above   SURGEON: Leonides Sake, MD  ASSISTANT(S): Newton Pigg, PAC  ANESTHESIA: general  ESTIMATED BLOOD LOSS: 50 cc  FINDING(S): 1. Biphasic common femoral artery distal to anastomosis 2. Flow signature in common femoral vein consistent with widely patent arteriovenous graft, with a small pulsatile component 3. Dopplerable dorsalis pedis and posterior tibial arteries at end of case  SPECIMEN(S):  none  INDICATIONS:   Ebony [name] is a 77 y.o. female who  presents with end stage renal disease who has no further access options in the arms.  Risk, benefits, and alternatives to thigh arteriovenous graft placement were discussed.  The patient is aware the risks include but are not limited to: bleeding, infection, steal syndrome, nerve damage, ischemic monomelic neuropathy, failure to mature, possible development of critical limb ischemia due to steal syndrome and need for additional procedures.  The patient is aware of the risks and elects to proceed forward.  DESCRIPTION: After full informed written consent was obtained from the patient, the patient was brought back to the operating room and placed supine upon the operating table.  The patient was given IV antibiotics prior to proceeding.  After obtaining adequate sedation, the patient was prepped and draped in standard fashion for a right thigh arteriovenous graft placement.  I made a longitudinal incision over the common femoral artery and dissected down through the subcutaneous tissue until I had access to the common femoral artery and common femoral vein.  The common femoral artery appeared to be 5 mm externally and the common femoral vein appeared to be 5 mm externally.  I obtained a 6 mm Goretex graft without Heparin bonding and stretched it to full length to determined where the  apex of the loop of the graft would be located.  I then placed transverse incisions on the lateral and medial thigh and dissected a small pocket in the subcutaneous tissue at both locations.   Then using a Gore tunneler I tunneled from the groin to the lateral thigh incision and delivered the graft.  I then tunneled from lateral thigh incision to medial thigh incision and delivered the graft.  Finally, I tunneled from medial thigh incision to the groin and finished delivering the graft.  The orientation of the graft was maintained throughout this process.  I then started the patient on an Angiomax drip to obtain anticoagulation, as this patient has heparin induced thrombocytopenia (HIT).  After waiting 3 minutes, I then clamped the common femoral artery proximally and distally and made an arteriotomy and extended it with a Potts scissor.  I then spatulate the graft to meet the dimensions of the artery and sewed it to the artery in an end-to-side configuration with a running stitch of CV-5 suture.  I clamped the graft near its arterial anastomosis and released the clamps on the common femoral artery.  I then pulled the graft to appropriate tension throughout the tunnel.  I then clamped the common femoral vein proximally and distally and made a venotomy, which I extended with a Pott's scissor.  The graft was then spatulated to meet the dimensions of the venotomy.  The graft was sewn to the vein in an end-to-side configuration with a running stitch of CV-5.  Prior to completion this anastomosis, I released all clamps on the graft and vein, allowing the graft and vein to backbleed before completing the anastomosis.  There was excellent pulsatile graft bleeding.  There was excellent backbleeding from the vein.  The anastomosis was completed in the usual fashion.  I placed thrombin and Gelfoam in the wound.  After waiting a few minutes, I washed out the wound and there was no further active bleeding.  The common femoral  artery was examined which demonstrated: multiphasic flow in the common femoral artery proximal and distal to the anastomosis.  The venous outflow was also examined, which demonstrated: widely patent arteriovenous graft flow signature with a small pulsatile component, suggesting some degree of proximal venous stenosis.  The groin was repaired with a double layer of 2-0 Vicryl, a double layer of 3-0 Vicryl, and a running subcuticular of 4-0 Monocryl.   The two thigh incision were repaired with 3-0 Vicryl in the subcutaneous tissue and the skin reapproximated with a 4-0 Monocryl stitch.  The skin at all sites were then cleaned, dried, and Dermabond used to reinforce the skin closure.    COMPLICATIONS: none  CONDITION: stable  Leonides Sake, MD 02/12/2011 10:49 AM

## 2011-02-13 LAB — BASIC METABOLIC PANEL
BUN: 55 mg/dL — ABNORMAL HIGH (ref 6–23)
CO2: 20 mEq/L (ref 19–32)
Calcium: 8.8 mg/dL (ref 8.4–10.5)
Chloride: 98 mEq/L (ref 96–112)
Creatinine, Ser: 7.94 mg/dL — ABNORMAL HIGH (ref 0.50–1.10)
GFR calc Af Amer: 5 mL/min — ABNORMAL LOW (ref >90)

## 2011-02-13 LAB — CBC
HCT: 47.7 % — ABNORMAL HIGH (ref 36.0–46.0)
MCH: 29.1 pg (ref 26.0–34.0)
MCV: 96.4 fL (ref 78.0–100.0)
Platelets: 115 10*3/uL — ABNORMAL LOW (ref 150–400)
RDW: 16.9 % — ABNORMAL HIGH (ref 11.5–15.5)
WBC: 5.4 10*3/uL (ref 4.0–10.5)

## 2011-02-13 LAB — GLUCOSE, CAPILLARY
Glucose-Capillary: 149 mg/dL — ABNORMAL HIGH (ref 70–99)
Glucose-Capillary: 169 mg/dL — ABNORMAL HIGH (ref 70–99)
Glucose-Capillary: 230 mg/dL — ABNORMAL HIGH (ref 70–99)

## 2011-02-13 MED ORDER — VANCOMYCIN HCL 500 MG IV SOLR
500.0000 mg | Freq: Once | INTRAVENOUS | Status: AC
  Administered 2011-02-13: 500 mg via INTRAVENOUS
  Filled 2011-02-13 (×2): qty 500

## 2011-02-13 MED ORDER — OXYCODONE HCL 5 MG PO TABS: 5.0000 mg | ORAL_TABLET | ORAL | Status: DC | PRN

## 2011-02-13 MED ORDER — PARICALCITOL 5 MCG/ML IV SOLN
INTRAVENOUS | Status: AC
  Administered 2011-02-13: 1 ug via INTRAVENOUS
  Filled 2011-02-13: qty 1

## 2011-02-13 MED ORDER — PARICALCITOL 5 MCG/ML IV SOLN
1.0000 ug | Freq: Once | INTRAVENOUS | Status: AC
  Administered 2011-02-13: 1 ug via INTRAVENOUS
  Filled 2011-02-13: qty 0.2

## 2011-02-13 NOTE — Progress Notes (Signed)
CSW met with pt while she was in dialysis. For assessment, please see pt's chart. Pt is agreeable to returning to [facility]. [facility] has received the discharge documentation and is ready to accept pt. Transportation will be provided by [facility]. CSW is signing off as no further clinical social work needs identified.   Dede Query, MSW, [facility] [phone]

## 2011-02-13 NOTE — Discharge Summary (Signed)
Addendum  I have independently interviewed and examined the patient, and I agree with the physician assistant's findings.  The patient had an uneventful right thigh arteriovenous graft without any steal syndrome to date.  Leonides Sake, MD [facility] [place] Office: [phone] Pager: [phone]  02/13/2011, 10:41 AM

## 2011-02-13 NOTE — Progress Notes (Signed)
Subjective:  77 yo bf with ESRD,HD tths, gkc,a.fib on coumadin, cva,Anemia, sec.hyperparathyroidism ,avgg infections, admitted post op new rt. Fem avgg by Dr. Imogene Burn. No cos eating brk.Noted plans for dc after hd today Alert for me in HD "weve got to stop meeting like this"  Objective Vital signs in last 24 hours: Filed Vitals:   02/12/11 2145 02/12/11 2344 02/13/11 0145 02/13/11 0452  BP: 127/62 121/63 125/65 112/56  Pulse: 55 53 65 62  Temp: 98.1 F (36.7 C) 98.6 F (37 C) 98.6 F (37 C) 98.4 F (36.9 C)  TempSrc: Oral Oral Oral Oral  Resp: 20 20 20 20   Height:      Weight: 65.1 kg (143 lb 8.3 oz)     SpO2: 97% 93% 95% 92%   Weight change:   Intake/Output Summary (Last 24 hours) at 02/13/11 0842 Last data filed at 02/12/11 1445  Gross per 24 hour  Intake   1000 ml  Output     50 ml  Net    950 ml   Labs: Basic Metabolic Panel:  Lab 02/13/11 8119 02/12/11 0648 02/07/11 0813  NA 137 141 139  K 5.3* 4.7 3.7  CL 98 -- --  CO2 20 -- --  GLUCOSE 207* 162* 116*  BUN 55* -- --  CREATININE 7.94* -- --  CALCIUM 8.8 -- --  ALB -- -- --  PHOS -- -- --   Liver Function Tests: No results found for this basename: AST:3,ALT:3,ALKPHOS:3,BILITOT:3,PROT:3,ALBUMIN:3 in the last 168 hours No results found for this basename: LIPASE:3,AMYLASE:3 in the last 168 hours No results found for this basename: AMMONIA:3 in the last 168 hours CBC:  Lab 02/13/11 0130 02/12/11 0648 02/07/11 0813  WBC 5.4 -- --  NEUTROABS -- -- --  HGB 14.4 18.4* 17.7*  HCT 47.7* 54.0* 52.0*  MCV 96.4 -- --  PLT 115* -- --   Cardiac Enzymes: No results found for this basename: CKTOTAL:5,CKMB:5,CKMBINDEX:5,TROPONINI:5 in the last 168 hours CBG:  Lab 02/13/11 0816 02/12/11 2144 02/12/11 1624 02/12/11 1109 02/07/11 1131  GLUCAP 169* 199* 154* 191* 90    Iron Studies: No results found for this basename: IRON,TIBC,TRANSFERRIN,FERRITIN in the last 72 hours Studies/Results: No results found. Medications:    . sodium chloride    . DISCONTD: sodium chloride    . DISCONTD: bivalirudin (ANGIOMAX) infusion 0.5 mg/mL (Non-ACS indications) Stopped (02/12/11 0900)      . amLODipine  10 mg Oral QHS  . calcium acetate  667 mg Oral TID WC  . calcium carbonate (dosed in mg elemental calcium)  500 mg of elemental calcium Oral Daily  . docusate sodium  100 mg Oral BID  . insulin aspart  0-15 Units Subcutaneous TID WC  . insulin glargine  5 Units Subcutaneous QHS  . lidocaine-prilocaine  1 application Topical 3 times weekly  . metoprolol  50 mg Oral 4 times weekly  . metoprolol  50 mg Oral 3 times weekly  . multivitamin  1 tablet Oral Daily  . multivitamin  1 tablet Oral QHS  . pantoprazole  40 mg Oral Q1200  . simvastatin  20 mg Oral QHS  . traZODone  25 mg Oral QHS  . warfarin  7 mg Oral q1800  . DISCONTD: mupirocin ointment   Nasal BID  . DISCONTD: warfarin  2 mg Oral See admin instructions  . DISCONTD: warfarin  2 mg Oral See admin instructions  . DISCONTD: warfarin  5 mg Oral See admin instructions   I  have  reviewed scheduled and prn medications.  Physical Exam: General:Alert,  nad denies pain Heart:RRR Lungs:CTA ant. Abdomen:bs nl soft, nt Extremities: Dialysis Access:rt. ij perm cath, no pedal edema. Left arm and wrist contractures  New R fem avg = bruit  Problem/Plan: 1. SP new right Fem. AVGG placed= dc per DR. Chen after hd 2. ESRD -TTHS  Hd via PC until avg matures 3. Anemia - NO EPO with hgb14.4, fu out pt. hgbs 4. Secondary hyperparathyroidism - zemplar on hd,phoslo with meals 5. HTN/volume - to dw on hd, norvasc, metoprolol, 6. A.fib. = on coumadin. sr on telem. 7. Cva= stable 8. HO infected Avgg= Vancomycin qhd through 02/17/11 per Dr. Meda Klinefelter, PA-C  [facility] Beeper [phone] 02/13/2011,8:42 AM  LOS: 1 day   Patient seen and examined, agree with above note.  S/p thigh AVG, looking forward to going back to [facility] after  HD Annie Sable, MD 02/13/2011

## 2011-02-13 NOTE — Progress Notes (Signed)
[facility] [place]  Daily Progress Note  Assessment/Planning: POD #1 s/p R thigh AVG placement   HD today then D/C back to nursing home  Subjective  - 1 Day Post-Op  Pain well controlled, no pain or numbness in right foot  Objective Filed Vitals:   02/12/11 2145 02/12/11 2344 02/13/11 0145 02/13/11 0452  BP: 127/62 121/63 125/65 112/56  Pulse: 55 53 65 62  Temp: 98.1 F (36.7 C) 98.6 F (37 C) 98.6 F (37 C) 98.4 F (36.9 C)  TempSrc: Oral Oral Oral Oral  Resp: 20 20 20 20   Height:      Weight: 143 lb 8.3 oz (65.1 kg)     SpO2: 97% 93% 95% 92%    Intake/Output Summary (Last 24 hours) at 02/13/11 0808 Last data filed at 02/12/11 1445  Gross per 24 hour  Intake   1000 ml  Output     50 ml  Net    950 ml    PULM  CTAB CV  RRR, occ. Extra beat GI  soft, NTND VASC  R groin incision c/d/i, warm right foot, motor and sensation intact in right foot, +thrill, +bruit  Laboratory CBC    Component Value Date/Time   WBC 5.4 02/13/2011 0130   HGB 14.4 02/13/2011 0130   HCT 47.7* 02/13/2011 0130   PLT 115* 02/13/2011 0130    BMET    Component Value Date/Time   NA 137 02/13/2011 0130   K 5.3* 02/13/2011 0130   CL 98 02/13/2011 0130   CO2 20 02/13/2011 0130   GLUCOSE 207* 02/13/2011 0130   BUN 55* 02/13/2011 0130   CREATININE 7.94* 02/13/2011 0130   CALCIUM 8.8 02/13/2011 0130   CALCIUM 8.6 07/07/2009 1725   GFRNONAA 4* 02/13/2011 0130   GFRAA 5* 02/13/2011 0130    Leonides Sake, MD [facility] [place] Office: [phone] Pager: [phone]  02/13/2011, 8:08 AM

## 2011-02-13 NOTE — Progress Notes (Signed)
CSW received referral for pt to return to SNF today. CSW confirmed with facility that pt is able to return. CSW will meet with pt after she returns from dialysis to discuss returning to facility. CSW will continue to follow.   Dede Query, MSW, Theresia Majors [phone]

## 2011-02-13 NOTE — Discharge Summary (Signed)
[facility] Discharge Summary   Patient ID:  Ebony [name] MRN: [id number] DOB/AGE: [birth year] 77 y.o.  Admit date: 02/12/2011 Discharge date: 02/13/2011 Date of Surgery: 02/12/2011 Surgeon: Surgeon(s): Nilda Simmer, MD  Admission Diagnosis: End Stage Renal Disease  Discharge Diagnoses:  End Stage Renal Disease  Secondary Diagnoses: Past Medical History  Diagnosis Date  . Atrial fibrillation   . CAD (coronary artery disease)   . Hypertension   . Anemia   . MRSA bacteremia     hx  . History of pneumonia   . Cellulitis   . GERD (gastroesophageal reflux disease)   . Acetabulum fracture   . Contracture of elbow     L arm  . Dementia   . Diabetes mellitus     on Insulin  . Stroke     Left side paralysis.  Left  foot drop., L arm contracted.  . Chronic kidney disease 02/07/2011    Hem dialysis at [facility] -TTS, hemo , last tx 02/06/11 hemo x 20years  . Decubitus ulcer, buttock 02/07/2011    Pt presents with dressing to R buttocks/coccyx.    Procedures: RLE INSERTION OF ARTERIOVENOUS (AV) GORE-TEX GRAFT THIGH  Discharged Condition: good HPI this 77 year old female is seen today in followup regarding removal of an infected right forearm AV graft. She also has had previous removal of a segment of left upper arm AV graft removed which was infected. She currently has a hemodialysis catheter in the right internal jugular vein. She has had no chills and fever since discharge from the hospital. She is admitted for right thigh AVGG.  Hospital Course:  Ebony [name] is a 77 y.o. female is S/P Right Procedure(s): INSERTION OF ARTERIOVENOUS (AV) GORE-TEX GRAFT THIGH Post-op wounds healing well Pt. Ambulating, voiding and taking PO diet without difficulty. No signs of Steal in RLE Pt pain controlled with PO pain meds. Labs as below Complications:none  Consults:     Significant Diagnostic Studies: CBC    Component Value Date/Time   WBC 5.4 02/13/2011 0130   RBC 4.95 02/13/2011 0130   HGB 14.4 02/13/2011 0130   HCT 47.7* 02/13/2011 0130   PLT 115* 02/13/2011 0130   MCV 96.4 02/13/2011 0130   MCH 29.1 02/13/2011 0130   MCHC 30.2 02/13/2011 0130   RDW 16.9* 02/13/2011 0130   LYMPHSABS 1.4 05/22/2010 1651   MONOABS 0.5 05/22/2010 1651   EOSABS 0.2 05/22/2010 1651   BASOSABS 0.0 05/22/2010 1651    BMET    Component Value Date/Time   NA 137 02/13/2011 0130   K 5.3* 02/13/2011 0130   CL 98 02/13/2011 0130   CO2 20 02/13/2011 0130   GLUCOSE 207* 02/13/2011 0130   BUN 55* 02/13/2011 0130   CREATININE 7.94* 02/13/2011 0130   CALCIUM 8.8 02/13/2011 0130   CALCIUM 8.6 07/07/2009 1725   GFRNONAA 4* 02/13/2011 0130   GFRAA 5* 02/13/2011 0130    COAG Lab Results  Component Value Date   INR 1.03 02/12/2011   INR 2.63* 02/07/2011   INR 1.35 01/13/2011     Disposition:  Discharge to :SNF Discharge Orders    Future Orders Please Complete By Expires   Resume previous diet      Call MD for:  temperature >100.5      Call MD for:  redness, tenderness, or signs of infection (pain, swelling, bleeding, redness, odor or green/yellow discharge around incision site)      Call MD for:  severe or increased  pain, loss or decreased feeling  in affected limb(s)      May shower       Increase activity slowly      Comments:   Walk with assistance use walker or cane as needed   may wash over wound with mild soap and water         [place], [place]  Home Medication Instructions [id number]   Printed on:02/13/11 0852  Medication Information                    insulin glargine (LANTUS) 100 UNIT/ML injection Inject 5 Units into the skin at bedtime.            omeprazole (PRILOSEC) 20 MG capsule Take 20 mg by mouth daily.            docusate sodium (COLACE) 100 MG capsule Take 100 mg by mouth 2 (two) times daily.            simvastatin (ZOCOR) 20 MG tablet Take 20 mg by mouth at bedtime.            traZODone (DESYREL) 50 MG tablet Take  25 mg by mouth at bedtime.            calcium acetate (PHOSLO) 667 MG capsule Take 667 mg by mouth 3 (three) times daily with meals.            lidocaine-prilocaine (EMLA) cream Apply 1 application topically 3 (three) times a week. Tues, thurs, and sat 30 mins before dialysis           metoprolol (LOPRESSOR) 50 MG tablet Take 50 mg by mouth 4 (four) times a week. Take twice daily on Sunday, Monday, Wednesday, Friday           bisacodyl (DULCOLAX) 5 MG EC tablet Take 10 mg by mouth daily as needed. For constipation           b complex-vitamin c-folic acid (NEPHRO-VITE) 0.8 MG TABS Take 1 tablet by mouth at bedtime.             amLODipine (NORVASC) 10 MG tablet Take 10 mg by mouth at bedtime.             multivitamin (RENA-VIT) TABS tablet Take 1 tablet by mouth at bedtime.           warfarin (COUMADIN) 5 MG tablet Take 5 mg by mouth See admin instructions. Take in the evening with 2mg  tab to =7mg  daily            warfarin (COUMADIN) 2 MG tablet Take 2 mg by mouth See admin instructions. Take in the evening with 5mg  tab to =7mg  daily           metoprolol (LOPRESSOR) 50 MG tablet Take 50 mg by mouth 3 (three) times a week. Take 1 tab in the evening on Tuesday, Thursday, and Saturday           oxyCODONE (OXY IR/ROXICODONE) 5 MG immediate release tablet Take 5-10 mg by mouth every 4 (four) hours as needed. For pain            calcium carbonate, dosed in mg elemental calcium, 1250 MG/5ML Take 500 mg of elemental calcium by mouth every 6 (six) hours as needed. For acid reflux            darbepoetin (ARANESP) 200 MCG/0.4ML SOLN Inject 200 mcg into the skin every 7 (seven) days.  oxyCODONE (OXY IR/ROXICODONE) 5 MG immediate release tablet Take 1-2 tablets (5-10 mg total) by mouth every 4 (four) hours as needed.            Verbal and written Discharge instructions given to the patient. Wound care per Discharge AVS   Signed: Marlowe Shores 02/13/2011, 8:52  AM

## 2011-02-13 NOTE — Plan of Care (Signed)
Outpatient Dialysis Care Plan reviewed. °

## 2011-02-13 NOTE — Progress Notes (Signed)
Utilization review completed. Jackalyn Haith, RN, BSN. 02/13/11  

## 2011-02-14 ENCOUNTER — Encounter (HOSPITAL_COMMUNITY): Payer: Self-pay | Admitting: Vascular Surgery

## 2011-02-15 NOTE — Progress Notes (Signed)
Pt discharged to SNF via ambulance. Pt remains stable. No signs and symptoms of distress. All paperwork and d/c instructions were sent with patient and given to the ambulance driver. Laverda Sorenson, RN

## 2011-02-16 ENCOUNTER — Encounter (HOSPITAL_COMMUNITY): Payer: Self-pay

## 2011-02-16 ENCOUNTER — Emergency Department (HOSPITAL_COMMUNITY)
Admission: EM | Admit: 2011-02-16 | Discharge: 2011-02-16 | Disposition: A | Discharge status: Skilled Nursing Facility | Payer: Medicare Other | Attending: Emergency Medicine | Admitting: Emergency Medicine

## 2011-02-16 DIAGNOSIS — I770 Arteriovenous fistula, acquired: Secondary | ICD-10-CM | POA: Insufficient documentation

## 2011-02-16 DIAGNOSIS — Z8679 Personal history of other diseases of the circulatory system: Secondary | ICD-10-CM | POA: Insufficient documentation

## 2011-02-16 DIAGNOSIS — K219 Gastro-esophageal reflux disease without esophagitis: Secondary | ICD-10-CM | POA: Insufficient documentation

## 2011-02-16 DIAGNOSIS — Z794 Long term (current) use of insulin: Secondary | ICD-10-CM | POA: Insufficient documentation

## 2011-02-16 DIAGNOSIS — M79609 Pain in unspecified limb: Secondary | ICD-10-CM

## 2011-02-16 DIAGNOSIS — M79651 Pain in right thigh: Secondary | ICD-10-CM

## 2011-02-16 DIAGNOSIS — Z992 Dependence on renal dialysis: Secondary | ICD-10-CM | POA: Insufficient documentation

## 2011-02-16 DIAGNOSIS — I4891 Unspecified atrial fibrillation: Secondary | ICD-10-CM | POA: Insufficient documentation

## 2011-02-16 DIAGNOSIS — I2510 Atherosclerotic heart disease of native coronary artery without angina pectoris: Secondary | ICD-10-CM | POA: Insufficient documentation

## 2011-02-16 DIAGNOSIS — E119 Type 2 diabetes mellitus without complications: Secondary | ICD-10-CM | POA: Insufficient documentation

## 2011-02-16 DIAGNOSIS — F0390 Unspecified dementia without behavioral disturbance: Secondary | ICD-10-CM | POA: Insufficient documentation

## 2011-02-16 DIAGNOSIS — N186 End stage renal disease: Secondary | ICD-10-CM

## 2011-02-16 DIAGNOSIS — Z48812 Encounter for surgical aftercare following surgery on the circulatory system: Secondary | ICD-10-CM

## 2011-02-16 DIAGNOSIS — Z7901 Long term (current) use of anticoagulants: Secondary | ICD-10-CM | POA: Insufficient documentation

## 2011-02-16 DIAGNOSIS — K5900 Constipation, unspecified: Secondary | ICD-10-CM

## 2011-02-16 DIAGNOSIS — I120 Hypertensive chronic kidney disease with stage 5 chronic kidney disease or end stage renal disease: Secondary | ICD-10-CM | POA: Insufficient documentation

## 2011-02-16 HISTORY — DX: Pain in unspecified limb: M79.609

## 2011-02-16 HISTORY — DX: Constipation, unspecified: K59.00

## 2011-02-16 LAB — CBC
Hemoglobin: 13.4 g/dL (ref 12.0–15.0)
MCH: 29.3 pg (ref 26.0–34.0)
MCHC: 30.9 g/dL (ref 30.0–36.0)
Platelets: 79 10*3/uL — ABNORMAL LOW (ref 150–400)
RBC: 4.57 MIL/uL (ref 3.87–5.11)

## 2011-02-16 LAB — BASIC METABOLIC PANEL
Calcium: 8.5 mg/dL (ref 8.4–10.5)
GFR calc Af Amer: 7 mL/min — ABNORMAL LOW (ref >90)
GFR calc non Af Amer: 6 mL/min — ABNORMAL LOW (ref >90)
Glucose, Bld: 290 mg/dL — ABNORMAL HIGH (ref 70–99)
Potassium: 3.6 mEq/L (ref 3.5–5.1)
Sodium: 135 mEq/L (ref 135–145)

## 2011-02-16 LAB — PROTIME-INR
INR: 2.12 — ABNORMAL HIGH (ref 0.00–1.49)
Prothrombin Time: 24.1 seconds — ABNORMAL HIGH (ref 11.6–15.2)

## 2011-02-16 MED ORDER — OXYCODONE-ACETAMINOPHEN 5-325 MG PO TABS: 1.0000 | ORAL_TABLET | ORAL | Status: AC | PRN

## 2011-02-16 MED ORDER — MORPHINE SULFATE 4 MG/ML IJ SOLN
4.0000 mg | Freq: Once | INTRAMUSCULAR | Status: AC
  Administered 2011-02-16: 4 mg via INTRAVENOUS
  Filled 2011-02-16: qty 1

## 2011-02-16 MED ORDER — CEFAZOLIN SODIUM 1-5 GM-% IV SOLN
1.0000 g | Freq: Once | INTRAVENOUS | Status: AC
  Administered 2011-02-16: 1 g via INTRAVENOUS
  Filled 2011-02-16: qty 50

## 2011-02-16 MED ORDER — CEPHALEXIN 500 MG PO CAPS: 500.0000 mg | ORAL_CAPSULE | Freq: Every day | ORAL | Status: AC

## 2011-02-16 NOTE — ED Notes (Signed)
Patient from [facility], reporting right leg pain intermittently since Tuesday, patient had graft to right leg this past week.

## 2011-02-16 NOTE — ED Notes (Signed)
Pt transported to Vascular lab by EMT.

## 2011-02-16 NOTE — ED Notes (Signed)
Quarter size discolored area above coccyx, skin somewhat intact with no drainage

## 2011-02-16 NOTE — ED Provider Notes (Signed)
History     CSN: [id number] Arrival date & time: 02/16/2011  2:50 PM   First MD Initiated Contact with Patient 02/16/11 1516      Chief Complaint  Patient presents with  . Leg Pain    (Consider location/radiation/quality/duration/timing/severity/associated sxs/prior treatment) Patient is a 77 y.o. female presenting with leg pain. The history is provided by the patient and medical records. The history is limited by the condition of the patient.  Leg Pain  Incident onset: unknown onset. The incident occurred at a nursing home. There was no injury mechanism. The pain is present in the right thigh. The quality of the pain is described as aching. The pain is moderate. The pain has been constant since onset. Pertinent negatives include no muscle weakness and no loss of sensation. She reports no foreign bodies present. The symptoms are aggravated by nothing. She has tried nothing for the symptoms.    Past Medical History  Diagnosis Date  . Atrial fibrillation   . CAD (coronary artery disease)   . Hypertension   . Anemia   . MRSA bacteremia     hx  . History of pneumonia   . Cellulitis   . GERD (gastroesophageal reflux disease)   . Acetabulum fracture   . Contracture of elbow     L arm  . Dementia   . Diabetes mellitus     on Insulin  . Stroke     Left side paralysis.  Left  foot drop., L arm contracted.  . Chronic kidney disease 02/07/2011    Hem dialysis at [facility] -TTS, hemo , last tx 02/06/11 hemo x 20years  . Decubitus ulcer, buttock 02/07/2011    Pt presents with dressing to R buttocks/coccyx.    Past Surgical History  Procedure Date  . Arteriovenous graft placement   . Appendectomy   . Cholecystectomy   . Tubal ligation     bilateral  . Abdominal hysterectomy   . Av fistula placement 02/07/2011    Procedure: INSERTION OF ARTERIOVENOUS (AV) GORE-TEX GRAFT THIGH;  Surgeon: Pryor Ochoa, MD;  Location: [facility] OR;  Service: Vascular;  Laterality: Right;  insertion  AVGG right thigh  . Av fistula placement 02/12/2011    Procedure: INSERTION OF ARTERIOVENOUS (AV) GORE-TEX GRAFT THIGH;  Surgeon: Nilda Simmer, MD;  Location: [facility] OR;  Service: Vascular;  Laterality: Right;    Family History  Problem Relation Age of Onset  . Diabetes Mother   . Hypertension Father   . Anesthesia problems Neg Hx   . Hypotension Neg Hx   . Malignant hyperthermia Neg Hx   . Pseudochol deficiency Neg Hx     History  Substance Use Topics  . Smoking status: Former Smoker -- 0.2 packs/day for 30 years    Types: Cigarettes    Quit date: 03/12/2000  . Smokeless tobacco: Never Used  . Alcohol Use: No    OB History    Grav Para Term Preterm Abortions TAB SAB Ect Mult Living                  Review of Systems  Unable to perform ROS: Dementia  Constitutional: Negative for fever.  Gastrointestinal: Negative for nausea and vomiting.  Skin: Positive for rash.    Allergies  Aspirin; Heparin; Minoxidil; and Penicillins  Home Medications   Current Outpatient Rx  Name Route Sig Dispense Refill  . AMLODIPINE BESYLATE 10 MG PO TABS Oral Take 10 mg by mouth at bedtime.      Marland Kitchen  INSULIN GLARGINE 100 UNIT/ML [place] SOLN Subcutaneous Inject 5 Units into the skin at bedtime.     Marland Kitchen LIDOCAINE-PRILOCAINE 2.5-2.5 % EX CREA Topical Apply 1 application topically 3 (three) times a week. Tues, thurs, and sat 30 mins before dialysis    . TRAZODONE HCL 50 MG PO TABS Oral Take 25 mg by mouth at bedtime.     . WARFARIN SODIUM 5 MG PO TABS Oral Take 5 mg by mouth daily. Take in the evening with 2 mg=7 mg daily.     Marland Kitchen NEPHRO-VITE 0.8 MG PO TABS Oral Take 1 tablet by mouth at bedtime.      Marland Kitchen BISACODYL 5 MG PO TBEC Oral Take 10 mg by mouth daily as needed. For constipation    . CALCIUM ACETATE 667 MG PO CAPS Oral Take 667 mg by mouth 3 (three) times daily with meals.     Marland Kitchen CALCIUM CARBONATE 1250 MG/5ML PO SUSP Oral Take 500 mg of elemental calcium by mouth every 6 (six) hours as needed. For  acid reflux     . DARBEPOETIN ALFA-POLYSORBATE 200 MCG/0.4ML IJ SOLN Subcutaneous Inject 200 mcg into the skin every 7 (seven) days.      . DOCUSATE SODIUM 100 MG PO CAPS Oral Take 100 mg by mouth 2 (two) times daily.     Marland Kitchen METOPROLOL TARTRATE 50 MG PO TABS Oral Take 50 mg by mouth 4 (four) times a week. Take twice daily on Sunday, Monday, Wednesday, Friday    . METOPROLOL TARTRATE 50 MG PO TABS Oral Take 50 mg by mouth 3 (three) times a week. Monday, Wednesday, & Friday. (non-dialysis days).    Marland Kitchen RENA-VITE PO TABS Oral Take 1 tablet by mouth at bedtime. 30 tablet 5  . OMEPRAZOLE 20 MG PO CPDR Oral Take 20 mg by mouth daily.     . OXYCODONE HCL 5 MG PO TABS Oral Take 5-10 mg by mouth every 4 (four) hours as needed. For pain     . OXYCODONE HCL 5 MG PO TABS Oral Take 1-2 tablets (5-10 mg total) by mouth every 4 (four) hours as needed. 30 tablet 0  . SIMVASTATIN 20 MG PO TABS Oral Take 20 mg by mouth at bedtime.     . WARFARIN SODIUM 2 MG PO TABS Oral Take 2 mg by mouth See admin instructions. Take in the evening with 5mg  tab to =7mg  daily      BP 124/56  Temp(Src) 98.4 F (36.9 C) (Oral)  Resp 20  Physical Exam  Nursing note and vitals reviewed. Constitutional: She appears well-developed and well-nourished.  HENT:  Head: Normocephalic and atraumatic.  Eyes: Pupils are equal, round, and reactive to light.  Cardiovascular: Normal rate, regular rhythm, normal heart sounds and intact distal pulses.   Pulmonary/Chest: Effort normal and breath sounds normal. No respiratory distress.  Abdominal: Soft. She exhibits no distension. There is no tenderness.  Musculoskeletal:       Legs: Neurological: She is alert. She is disoriented (location, events).  Skin: Skin is warm and dry.  Psychiatric: She has a normal mood and affect.    ED Course  Procedures (including critical care time)  Labs Reviewed  BASIC METABOLIC PANEL - Abnormal; Notable for the following:    Chloride 94 (*)    Glucose,  Bld 290 (*)    BUN 32 (*)    Creatinine, Ser 5.82 (*)    GFR calc non Af Amer 6 (*)    GFR calc Af Amer 7 (*)  All other components within normal limits  CBC - Abnormal; Notable for the following:    RDW 16.3 (*)    Platelets 79 (*) CONSISTENT WITH PREVIOUS RESULT   All other components within normal limits  PROTIME-INR - Abnormal; Notable for the following:    Prothrombin Time 24.1 (*)    INR 2.12 (*)    All other components within normal limits  APTT - Abnormal; Notable for the following:    aPTT 67 (*)    All other components within normal limits   No results found.   1. End stage renal disease   2. A-V fistula   3. Pain of right thigh       MDM  This is a 77 year old female with a history of end-stage renal disease, status post placement of a right thigh Gore-Tex graft for dialysis access placed on 02/12/2011 who presents today with right thigh pain. The patient is noted to be confused, and the history provided by her is limited. She says the nursing home told her not to walk today, so she is unsure how it feels bearing weight. She describes pain primarily along the medial side of her thigh, there have been no fevers, no other infectious symptoms. On exam to well-healing incisions are noted the one on the medial right thigh one in the lateral right thigh, however the area around the medial incision has an area of erythema approximately 5 cm in greatest diameter there is significant tenderness to palpation along the medial side, with a firm area noted approximately 1 cm in diameter, running from inferior to the incision site to superior to the incision, approximately 10 cm in length. Patient states she has dialysis on Tuesday, Thursday, Saturday, and had no problems with dialysis yesterday. She denies any other complaints. Will check basic labs come into electrolytes are not abnormal, and will get an ultrasound of the graft, to evaluate for DVT versus clot burden in the graft  itself.   Labs are unremarkable, without leukocytosis. The exam does not show any signs of clot burden. Discussed patient with Dr. Manson Passey, the on-call physician for vascular surgery, who states that this is likely part of the expected course following her surgery, however due to the edema surrounding the surgical incisions, recommend treatment for a possible cellulitis, and advised her to be seen in clinic next week. Discussed with the patient, who expressed understanding.     Theotis Burrow, MD 02/17/11 [phone]

## 2011-02-16 NOTE — ED Notes (Signed)
IV team paged for start.  

## 2011-02-16 NOTE — Progress Notes (Signed)
*  PRELIMINARY RESULTS* Right thigh HD AVG evaluation complete. Technically difficult due to severe pain. Graft appears to be patent. Evaluation completed by Gwenlyn Fudge VT  Ebony [name], [place] D 02/16/2011, 5:08 PM

## 2011-02-16 NOTE — ED Notes (Signed)
Pt left via [facility] back to SNF with all personal belongings

## 2011-02-16 NOTE — ED Notes (Signed)
[facility] contacted to transport pt back to home/NH-  per nurses request

## 2011-02-19 NOTE — ED Provider Notes (Signed)
I saw and evaluated the patient, reviewed the resident's note and I agree with the findings and plan.   Laray Anger, DO 02/19/11 2026

## 2011-02-19 NOTE — ED Provider Notes (Signed)
I saw and evaluated the patient, reviewed the resident's note and I agree with the findings and plan.   77 yo F, hx ESRD and poor historian, sent from NH to ED for c/o pain in her right thigh since placement of new graft for HD access on 02/12/11.  VSS, afebrile, confused/awake/alert, CTA, RRR, +right thigh tender around graft site with surgical incisions mildly erythematous.  No fluctuance, no drainage, no ecchymosis.  WBC normal, INR therapeutic, no clot in graft or abscess noted on [place] today.  Vasc Surgeon called and req to start abx, f/u in clinic this week.  Will give 1st dose abx in ED before d/c back to [facility]    [facility] M     Laray Anger, DO 02/19/11 2026

## 2011-03-01 ENCOUNTER — Encounter: Payer: Self-pay | Admitting: Vascular Surgery

## 2011-03-02 ENCOUNTER — Encounter: Payer: Self-pay | Admitting: Vascular Surgery

## 2011-03-02 ENCOUNTER — Ambulatory Visit (INDEPENDENT_AMBULATORY_CARE_PROVIDER_SITE_OTHER): Payer: Medicare Other | Admitting: Vascular Surgery

## 2011-03-02 VITALS — BP 130/91 | HR 58 | Temp 97.8°F | Resp 16 | Ht 66.0 in | Wt 143.0 lb

## 2011-03-02 DIAGNOSIS — T82898A Other specified complication of vascular prosthetic devices, implants and grafts, initial encounter: Secondary | ICD-10-CM

## 2011-03-02 DIAGNOSIS — N186 End stage renal disease: Secondary | ICD-10-CM

## 2011-03-02 NOTE — Progress Notes (Signed)
[facility] [place]  Postoperative Access Visit  History of Present Illness  Ebony [name] is a 77 y.o. year old female who presents for postoperative follow-up for: R thigh AVG (Date: 02/12/11).  The patient's wounds are not healed.  The patient notes no steal symptoms.  The patient is able to complete their activities of daily living.  The patient's current symptoms are: some drainage in right groin.  The patient also notes some swelling in right leg, which has improved since the original surgery.  Physical Examination  Filed Vitals:   03/02/11 1444  BP: 130/91  Pulse: 58  Temp: 97.8 F (36.6 C)  Resp: 16   RLE: distal thigh incisions are healed, R groin incision has superficial separation and limited maceration, skin feels warm, sensation in toe is intact, palpable thrill, bruit can be auscultated, R thigh slightly edematous  Medical Decision Making  Ebony [name] is a 77 y.o. year old female who presents s/p R thigh AVG placement.  Clean right groin TID and dress dry.  Should seal soon.  The swelling may reflect some degree of venous outflow stenosis, but no intervention needed immediately.  The patient will follow up in 2 weeks for wound check.  Thank you for allowing [place] to participate in this patient's care.  Leonides Sake, MD [facility] [place] Office: [phone] Pager: [phone]

## 2011-03-13 ENCOUNTER — Inpatient Hospital Stay (HOSPITAL_COMMUNITY)
Admission: EM | Admit: 2011-03-13 | Discharge: 2011-03-17 | DRG: 579 | Disposition: A | Discharge status: Skilled Nursing Facility | Payer: Medicare Other | Attending: Internal Medicine | Admitting: Internal Medicine

## 2011-03-13 ENCOUNTER — Encounter (HOSPITAL_COMMUNITY): Payer: Self-pay | Admitting: Emergency Medicine

## 2011-03-13 DIAGNOSIS — Z7901 Long term (current) use of anticoagulants: Secondary | ICD-10-CM

## 2011-03-13 DIAGNOSIS — Z880 Allergy status to penicillin: Secondary | ICD-10-CM

## 2011-03-13 DIAGNOSIS — D631 Anemia in chronic kidney disease: Secondary | ICD-10-CM | POA: Diagnosis present

## 2011-03-13 DIAGNOSIS — N039 Chronic nephritic syndrome with unspecified morphologic changes: Secondary | ICD-10-CM | POA: Diagnosis present

## 2011-03-13 DIAGNOSIS — Z992 Dependence on renal dialysis: Secondary | ICD-10-CM

## 2011-03-13 DIAGNOSIS — M216X9 Other acquired deformities of unspecified foot: Secondary | ICD-10-CM | POA: Diagnosis present

## 2011-03-13 DIAGNOSIS — D72819 Decreased white blood cell count, unspecified: Secondary | ICD-10-CM | POA: Diagnosis present

## 2011-03-13 DIAGNOSIS — Z833 Family history of diabetes mellitus: Secondary | ICD-10-CM

## 2011-03-13 DIAGNOSIS — I10 Essential (primary) hypertension: Secondary | ICD-10-CM | POA: Diagnosis present

## 2011-03-13 DIAGNOSIS — Z79899 Other long term (current) drug therapy: Secondary | ICD-10-CM

## 2011-03-13 DIAGNOSIS — Z8249 Family history of ischemic heart disease and other diseases of the circulatory system: Secondary | ICD-10-CM

## 2011-03-13 DIAGNOSIS — I69959 Hemiplegia and hemiparesis following unspecified cerebrovascular disease affecting unspecified side: Secondary | ICD-10-CM

## 2011-03-13 DIAGNOSIS — Z794 Long term (current) use of insulin: Secondary | ICD-10-CM

## 2011-03-13 DIAGNOSIS — Z888 Allergy status to other drugs, medicaments and biological substances status: Secondary | ICD-10-CM

## 2011-03-13 DIAGNOSIS — Z8614 Personal history of Methicillin resistant Staphylococcus aureus infection: Secondary | ICD-10-CM

## 2011-03-13 DIAGNOSIS — I2510 Atherosclerotic heart disease of native coronary artery without angina pectoris: Secondary | ICD-10-CM | POA: Diagnosis present

## 2011-03-13 DIAGNOSIS — I4891 Unspecified atrial fibrillation: Secondary | ICD-10-CM | POA: Diagnosis present

## 2011-03-13 DIAGNOSIS — L02419 Cutaneous abscess of limb, unspecified: Principal | ICD-10-CM | POA: Diagnosis present

## 2011-03-13 DIAGNOSIS — Z87891 Personal history of nicotine dependence: Secondary | ICD-10-CM

## 2011-03-13 DIAGNOSIS — D696 Thrombocytopenia, unspecified: Secondary | ICD-10-CM | POA: Diagnosis present

## 2011-03-13 DIAGNOSIS — K219 Gastro-esophageal reflux disease without esophagitis: Secondary | ICD-10-CM | POA: Diagnosis present

## 2011-03-13 DIAGNOSIS — N186 End stage renal disease: Secondary | ICD-10-CM

## 2011-03-13 DIAGNOSIS — Y92009 Unspecified place in unspecified non-institutional (private) residence as the place of occurrence of the external cause: Secondary | ICD-10-CM

## 2011-03-13 DIAGNOSIS — F0390 Unspecified dementia without behavioral disturbance: Secondary | ICD-10-CM | POA: Diagnosis present

## 2011-03-13 DIAGNOSIS — L0390 Cellulitis, unspecified: Secondary | ICD-10-CM

## 2011-03-13 DIAGNOSIS — I429 Cardiomyopathy, unspecified: Secondary | ICD-10-CM | POA: Diagnosis present

## 2011-03-13 DIAGNOSIS — N2581 Secondary hyperparathyroidism of renal origin: Secondary | ICD-10-CM | POA: Diagnosis present

## 2011-03-13 DIAGNOSIS — T82898A Other specified complication of vascular prosthetic devices, implants and grafts, initial encounter: Secondary | ICD-10-CM | POA: Diagnosis present

## 2011-03-13 DIAGNOSIS — I120 Hypertensive chronic kidney disease with stage 5 chronic kidney disease or end stage renal disease: Secondary | ICD-10-CM | POA: Diagnosis present

## 2011-03-13 DIAGNOSIS — Z886 Allergy status to analgesic agent status: Secondary | ICD-10-CM

## 2011-03-13 DIAGNOSIS — E119 Type 2 diabetes mellitus without complications: Secondary | ICD-10-CM | POA: Diagnosis present

## 2011-03-13 DIAGNOSIS — Y832 Surgical operation with anastomosis, bypass or graft as the cause of abnormal reaction of the patient, or of later complication, without mention of misadventure at the time of the procedure: Secondary | ICD-10-CM | POA: Diagnosis present

## 2011-03-13 HISTORY — DX: Pneumonia, unspecified organism: J18.9

## 2011-03-13 HISTORY — DX: Encounter for other specified aftercare: Z51.89

## 2011-03-13 HISTORY — DX: Reserved for inherently not codable concepts without codable children: IMO0001

## 2011-03-13 HISTORY — DX: Dependence on renal dialysis: N18.6

## 2011-03-13 LAB — DIFFERENTIAL
Basophils Relative: 1 % (ref 0–1)
Eosinophils Absolute: 0.2 10*3/uL (ref 0.0–0.7)
Lymphs Abs: 1.2 10*3/uL (ref 0.7–4.0)
Monocytes Absolute: 0.5 10*3/uL (ref 0.1–1.0)
Neutro Abs: 1 10*3/uL — ABNORMAL LOW (ref 1.7–7.7)

## 2011-03-13 LAB — BASIC METABOLIC PANEL
BUN: 30 mg/dL — ABNORMAL HIGH (ref 6–23)
Creatinine, Ser: 5.02 mg/dL — ABNORMAL HIGH (ref 0.50–1.10)
GFR calc Af Amer: 9 mL/min — ABNORMAL LOW (ref >90)
GFR calc non Af Amer: 7 mL/min — ABNORMAL LOW (ref >90)

## 2011-03-13 LAB — CBC
HCT: 41 % (ref 36.0–46.0)
Hemoglobin: 12.8 g/dL (ref 12.0–15.0)
MCH: 28 pg (ref 26.0–34.0)
MCHC: 31.2 g/dL (ref 30.0–36.0)

## 2011-03-13 MED ORDER — CLINDAMYCIN PHOSPHATE 600 MG/50ML IV SOLN
600.0000 mg | Freq: Once | INTRAVENOUS | Status: AC
  Administered 2011-03-13: 600 mg via INTRAVENOUS
  Filled 2011-03-13: qty 50

## 2011-03-13 NOTE — ED Notes (Signed)
Patient currently sitting up in bed; no respiratory or acute distress noted.  Patient tearful because she states that she does not want to stay in the hospital and wants to go back to the nursing home.  Patient refusing IV until she gets something to eat.  Resident notified that patient wants something to eat.  Will continue to monitor.

## 2011-03-13 NOTE — ED Notes (Addendum)
Admitting MD at bedside.  Received call that bed is ready; patient to be transported upstairs with nurse tech after admitting MD is done.

## 2011-03-13 NOTE — ED Provider Notes (Signed)
History     CSN: [id number]  Arrival date & time 03/13/11  1453   First MD Initiated Contact with Patient 03/13/11 1502      Chief Complaint  Patient presents with  . Leg Swelling    (Consider location/radiation/quality/duration/timing/severity/associated sxs/prior treatment) HPI history from patient who is a poor historian Patient is a 78 year old female with history of end-stage renal disease (HD through right IJ PermCath, recent placement of right femoral Gore-Tex graft) who presents with right lower extremity swelling and pain. She states this started about 2 weeks ago. It has been progressive and constant. She has tried placing heat pad on it but it is not helping. She states the pain is localized to the thigh. It is worse when you palpated. No overlying rash. She has recent surgical wounds but they have not changed. No drainage from them. Patient was recently evaluated for this problem and started on antibiotics. She has completed course. She denies fever. She also denies chest pain or dyspnea. Had uncomplicated dialysis session thru PermCath yesterday.   overall severity moderate.  Past Medical History  Diagnosis Date  . Campath-induced atrial fibrillation   . CAD (coronary artery disease)   . Hypertension   . Anemia   . MRSA bacteremia     hx  . Cellulitis   . GERD (gastroesophageal reflux disease)   . Acetabulum fracture   . Contracture of elbow     L arm  . Dementia   . Diabetes mellitus     on Insulin  . Decubitus ulcer, buttock 02/07/2011    Pt presents with dressing to R buttocks/coccyx.  . Pneumonia   . Blood transfusion   . Stroke ~ 1990's    Left side paralysis.  Left  foot drop., L arm contracted.  . Chronic kidney disease 03/13/11    dialysis at [facility]; TTS,last tx 03/12/11 hemo x 20years    Past Surgical History  Procedure Date  . Arteriovenous graft placement   . Appendectomy   . Cholecystectomy   . Tubal ligation     bilateral  . Abdominal  hysterectomy   . Av fistula placement 02/07/2011    Procedure: INSERTION OF ARTERIOVENOUS (AV) GORE-TEX GRAFT THIGH;  Surgeon: Pryor Ochoa, MD;  Location: [facility] OR;  Service: Vascular;  Laterality: Right;  insertion AVGG right thigh  . Av fistula placement 02/12/2011    Procedure: INSERTION OF ARTERIOVENOUS (AV) GORE-TEX GRAFT THIGH;  Surgeon: Nilda Simmer, MD;  Location: [facility] OR;  Service: Vascular;  Laterality: Right;  . Cataract extraction w/ intraocular lens  implant, bilateral     Family History  Problem Relation Age of Onset  . Diabetes Mother   . Hypertension Father   . Anesthesia problems Neg Hx   . Hypotension Neg Hx   . Malignant hyperthermia Neg Hx   . Pseudochol deficiency Neg Hx     History  Substance Use Topics  . Smoking status: Former Smoker -- 0.2 packs/day for 30 years    Types: Cigarettes    Quit date: 03/12/2000  . Smokeless tobacco: Never Used  . Alcohol Use: Yes     "last alcohol ~ 1990"    OB History    Grav Para Term Preterm Abortions TAB SAB Ect Mult Living                  Review of Systems  Constitutional: Negative for fever and chills.  HENT: Negative for facial swelling.   Eyes: Negative for visual  disturbance.  Respiratory: Negative for cough, chest tightness, shortness of breath and wheezing.   Cardiovascular: Negative for chest pain.  Gastrointestinal: Negative for nausea, vomiting, abdominal pain and diarrhea.  Skin: Negative for rash.  Neurological: Negative for weakness and numbness.  Psychiatric/Behavioral: Negative for behavioral problems and confusion.  All other systems reviewed and are negative.    Allergies  Aspirin; Heparin; Minoxidil; and Penicillins  Home Medications   No current outpatient prescriptions on file.  BP 121/65  Pulse 60  Temp(Src) 98.1 F (36.7 C) (Oral)  Resp 18  SpO2 97%  Physical Exam  Nursing note and vitals reviewed. Constitutional: She appears well-developed and well-nourished. No  distress.  HENT:  Head: Normocephalic.  Nose: Nose normal.  Eyes: EOM are normal.  Neck: Normal range of motion. Neck supple.  Cardiovascular: Normal rate, regular rhythm and intact distal pulses.   No murmur heard. Pulmonary/Chest: Effort normal and breath sounds normal. No respiratory distress. She has no wheezes. She exhibits no tenderness.  Abdominal: Soft. She exhibits no distension. There is no tenderness.  Musculoskeletal: Normal range of motion. She exhibits no edema and no tenderness.       No calf TTP  Right lower extremity: Moderate to severe swelling of right thigh. Anterior induration but no overlying cellulitis. Small surgical wounds on medial and lateral anterior thigh. They're clean and dry without drainage. Also surgical incision and right femoral region that is clean and dry. Right eye is tender to palpation. It is also warm. No right calf tenderness palpation.   1+ DP pulses in bilateral lower extremities. Normal motor function and sensation in both lower extremities.   Neurological: She is alert.       Normal strength Oriented to place and person but occasionally confused on exam and inconsistent history  Skin: Skin is warm and dry. She is not diaphoretic.  Psychiatric: She has a normal mood and affect. Her behavior is normal. Thought content normal.    ED Course  Procedures (including critical care time)  Labs Reviewed  CBC - Abnormal; Notable for the following:    WBC 2.9 (*)    RDW 16.9 (*)    Platelets 84 (*)    All other components within normal limits  DIFFERENTIAL - Abnormal; Notable for the following:    Neutrophils Relative 36 (*)    Monocytes Relative 17 (*)    Eosinophils Relative 6 (*)    Neutro Abs 1.0 (*)    All other components within normal limits  BASIC METABOLIC PANEL - Abnormal; Notable for the following:    Sodium 133 (*)    Chloride 94 (*)    Glucose, Bld 181 (*)    BUN 30 (*)    Creatinine, Ser 5.02 (*)    GFR calc non Af Amer 7  (*)    GFR calc Af Amer 9 (*)    All other components within normal limits  GLUCOSE, CAPILLARY - Abnormal; Notable for the following:    Glucose-Capillary 165 (*)    All other components within normal limits  PROTIME-INR  POCT CBG MONITORING  POCT CBG MONITORING  HEMOGLOBIN A1C  COMPREHENSIVE METABOLIC PANEL   No results found.   1. Cellulitis       MDM   Clinical picture is consistent with failed outpatient treatment of cellulitis. Patient has large indurated and warm area on thigh. Bedside ultrasound was performed. This did not reveal any fluid collection or drainable abscess. This further argues for cellulitis as etiology. Patient  has recently had ultrasound evaluation of her graft and it appeared to be functioning well. Based on severity of symptoms and failed outpatient antibiotics, will admit for IV antibiotics. With recent graft placement, discussed with vascular surgery to see patient in hospital in the morning. No evidence of peripheral vascular compromise or obvious graft dysfunction.        Milus Glazier 03/14/11 0025  Kathlene November Shaira Sova 03/14/11 0025

## 2011-03-13 NOTE — H&P (Addendum)
Ebony [name] is an 78 y.o. female.   Chief Complaint: Right leg swelling HPI: 78 yo resident of [address] of [place] with history of ESRD and prior history of MRSA infection here with right thigh swelling, pain for 2 weeks now. She has been treated as outpatient with oral antibiotics but no relief. She had a Gore-Tex graft in the locationbut has not been using it for HD. She has been getting Dialysis on TTS but Dialyzed yesterday due to the [place]. No fever, no chills, no NVD. She is Diabetic with Diabetic ulcers which has been under control.  Past Medical History  Diagnosis Date  . Campath-induced atrial fibrillation   . CAD (coronary artery disease)   . Hypertension   . Anemia   . MRSA bacteremia     hx  . History of pneumonia   . Cellulitis   . GERD (gastroesophageal reflux disease)   . Acetabulum fracture   . Contracture of elbow     L arm  . Dementia   . Diabetes mellitus     on Insulin  . Stroke     Left side paralysis.  Left  foot drop., L arm contracted.  . Chronic kidney disease 02/07/2011    Hem dialysis at [facility] -TTS, hemo , last tx 02/06/11 hemo x 20years  . Decubitus ulcer, buttock 02/07/2011    Pt presents with dressing to R buttocks/coccyx.    Past Surgical History  Procedure Date  . Arteriovenous graft placement   . Appendectomy   . Cholecystectomy   . Tubal ligation     bilateral  . Abdominal hysterectomy   . Av fistula placement 02/07/2011    Procedure: INSERTION OF ARTERIOVENOUS (AV) GORE-TEX GRAFT THIGH;  Surgeon: Pryor Ochoa, MD;  Location: [facility] OR;  Service: Vascular;  Laterality: Right;  insertion AVGG right thigh  . Av fistula placement 02/12/2011    Procedure: INSERTION OF ARTERIOVENOUS (AV) GORE-TEX GRAFT THIGH;  Surgeon: Nilda Simmer, MD;  Location: [facility] OR;  Service: Vascular;  Laterality: Right;    Family History  Problem Relation Age of Onset  . Diabetes Mother   . Hypertension Father   . Anesthesia problems Neg  Hx   . Hypotension Neg Hx   . Malignant hyperthermia Neg Hx   . Pseudochol deficiency Neg Hx    Social History:  reports that she quit smoking about 11 years ago. Her smoking use included Cigarettes. She has a 7.5 pack-year smoking history. She has never used smokeless tobacco. She reports that she does not drink alcohol or use illicit drugs.  Allergies:  Allergies  Allergen Reactions  . Aspirin     Unknown    . Heparin     Unknown    . Minoxidil     Unknown    . Penicillins     Unknown      Medications Prior to Admission  Medication Dose Route Frequency Provider Last Rate Last Dose  . clindamycin (CLEOCIN) IVPB 600 mg  600 mg Intravenous Once Kathlene November Schinlever   600 mg at 03/13/11 2103   Medications Prior to Admission  Medication Sig Dispense Refill  . amLODipine (NORVASC) 10 MG tablet Take 10 mg by mouth at bedtime.        Marland Kitchen b complex-vitamin c-folic acid (NEPHRO-VITE) 0.8 MG TABS Take 1 tablet by mouth at bedtime.        . bisacodyl (DULCOLAX) 5 MG EC tablet Take 10 mg by mouth daily as needed. For  constipation      . calcium acetate (PHOSLO) 667 MG capsule Take 667 mg by mouth 3 (three) times daily with meals.       . calcium carbonate, dosed in mg elemental calcium, 1250 MG/5ML Take 500 mg of elemental calcium by mouth every 6 (six) hours as needed. For acid reflux      . darbepoetin (ARANESP) 200 MCG/0.4ML SOLN Inject 200 mcg into the skin every 7 (seven) days. Takes on Thursdays.      Marland Kitchen docusate sodium (COLACE) 100 MG capsule Take 100 mg by mouth 2 (two) times daily.       . insulin glargine (LANTUS) 100 UNIT/ML injection Inject 5 Units into the skin at bedtime.       . lidocaine-prilocaine (EMLA) cream Apply 1 application topically 3 (three) times a week. Tues, thurs, and sat 30 mins before dialysis      . metoprolol (LOPRESSOR) 50 MG tablet Take 50 mg by mouth 4 (four) times a week. Take twice daily on Sunday, Monday, Wednesday, Friday      . metoprolol (LOPRESSOR) 50  MG tablet Take 50 mg by mouth 3 (three) times a week. Takes daily after dialysis on Tuesday, Thursday, and Saturday.       . multivitamin (RENA-VIT) TABS tablet Take 1 tablet by mouth at bedtime.  30 tablet  5  . omeprazole (PRILOSEC) 20 MG capsule Take 20 mg by mouth daily.       . risperiDONE (RISPERDAL) 0.25 MG tablet Take 0.25 mg by mouth daily.        . simvastatin (ZOCOR) 20 MG tablet Take 20 mg by mouth at bedtime.       . traZODone (DESYREL) 50 MG tablet Take 25 mg by mouth at bedtime.         Results for orders placed during the hospital encounter of 03/13/11 (from the past 48 hour(s))  CBC     Status: Abnormal   Collection Time   03/13/11  4:00 PM      Component Value Range Comment   WBC 2.9 (*) 4.0 - 10.5 (K/uL)    RBC 4.57  3.87 - 5.11 (MIL/uL)    Hemoglobin 12.8  12.0 - 15.0 (g/dL)    HCT 78.2  95.6 - 21.3 (%)    MCV 89.7  78.0 - 100.0 (fL)    MCH 28.0  26.0 - 34.0 (pg)    MCHC 31.2  30.0 - 36.0 (g/dL)    RDW 08.6 (*) 57.8 - 15.5 (%)    Platelets 84 (*) 150 - 400 (K/uL)   DIFFERENTIAL     Status: Abnormal   Collection Time   03/13/11  4:00 PM      Component Value Range Comment   Neutrophils Relative 36 (*) 43 - 77 (%)    Lymphocytes Relative 40  12 - 46 (%)    Monocytes Relative 17 (*) 3 - 12 (%)    Eosinophils Relative 6 (*) 0 - 5 (%)    Basophils Relative 1  0 - 1 (%)    Neutro Abs 1.0 (*) 1.7 - 7.7 (K/uL)    Lymphs Abs 1.2  0.7 - 4.0 (K/uL)    Monocytes Absolute 0.5  0.1 - 1.0 (K/uL)    Eosinophils Absolute 0.2  0.0 - 0.7 (K/uL)    Basophils Absolute 0.0  0.0 - 0.1 (K/uL)   BASIC METABOLIC PANEL     Status: Abnormal   Collection Time   03/13/11  4:00 PM  Component Value Range Comment   Sodium 133 (*) 135 - 145 (mEq/L)    Potassium 4.3  3.5 - 5.1 (mEq/L)    Chloride 94 (*) 96 - 112 (mEq/L)    CO2 25  19 - 32 (mEq/L)    Glucose, Bld 181 (*) 70 - 99 (mg/dL)    BUN 30 (*) 6 - 23 (mg/dL)    Creatinine, Ser 1.61 (*) 0.50 - 1.10 (mg/dL)    Calcium 9.4  8.4 - 10.5  (mg/dL)    GFR calc non Af Amer 7 (*) >90 (mL/min)    GFR calc Af Amer 9 (*) >90 (mL/min)   PROTIME-INR     Status: Normal   Collection Time   03/13/11  4:00 PM      Component Value Range Comment   Prothrombin Time 14.6  11.6 - 15.2 (seconds)    INR 1.12  0.00 - 1.49    GLUCOSE, CAPILLARY     Status: Abnormal   Collection Time   03/13/11  7:44 PM      Component Value Range Comment   Glucose-Capillary 165 (*) 70 - 99 (mg/dL)    Comment 1 Notify RN      No results found.  Review of Systems  Constitutional: Positive for malaise/fatigue. Negative for fever and chills.  HENT: Negative.   Eyes: Negative.   Respiratory: Negative.   Cardiovascular: Negative.   Gastrointestinal: Negative.   Genitourinary: Negative.   Musculoskeletal: Negative.   Skin: Positive for rash.  Neurological: Positive for weakness.  Psychiatric/Behavioral: Negative.     Blood pressure 119/54, pulse 53, temperature 97.4 F (36.3 C), temperature source Oral, resp. rate 15, SpO2 97.00%. Physical Exam  Constitutional: She is oriented to person, place, and time.       Stable, No acute distress  HENT:  Head: Normocephalic and atraumatic.  Left Ear: External ear normal.  Nose: Nose normal.  Mouth/Throat: Oropharynx is clear and moist.  Eyes: Conjunctivae and EOM are normal. Pupils are equal, round, and reactive to light.  Neck: Normal range of motion. Neck supple.  Cardiovascular: Normal pulses.  An irregularly irregular rhythm present.  Respiratory: Effort normal and breath sounds normal.  GI: Soft. Bowel sounds are normal.  Musculoskeletal: She exhibits tenderness.  Neurological: She is alert and oriented to person, place, and time.  Skin: Skin is warm. Rash noted. She is not diaphoretic. There is erythema.     Assessment/Plan 1. Cellulitis: Worried about thrombosis of her graft but apparently a bedside  Doppler was negative With the history of MRSA, we will treat with Vancomycin and get Vascular surgery  to reassess. Get Blood cultures as well. 2. Leucopenia: Probably infection vs Medications. No history of Liver disease. Continue to monitor 3. Thrombocytopenia: No bleeding, Continue to monitor. 4. DM2: SSI 5. HTN: Controlled 6. ESRD: Will consult Nephrology for HD  [facility] 03/13/2011, 9:31 PM

## 2011-03-13 NOTE — ED Notes (Signed)
Charge nurse aware that we are waiting for a bed to be placed in room on 5100.

## 2011-03-13 NOTE — ED Notes (Signed)
Report given to [place], RN on 5100.  Gershon Cull states that there is currently not a bed in the room and that she will call when one is placed.  Preparing patient for transport.

## 2011-03-13 NOTE — ED Notes (Signed)
[id number] Ready

## 2011-03-13 NOTE — ED Notes (Signed)
Pt sent from [facility] assisted living facility for evaluation for right upper thigh swelling since this AM. Right lower extremity very swollen, painful for EMS. Previous dialysis shunt to RUE. Denies fevers, chills, n/v.  Distal circulation intact.

## 2011-03-13 NOTE — ED Notes (Signed)
Received bedside report from [place], [place].  Patient currently sitting up in bed; no respiratory or acute distress noted.  Resident currently at bedside.  Introduced self to patient.  Will continue to monitor.

## 2011-03-13 NOTE — ED Notes (Signed)
Patient currently resting quietly in bed; no respiratory or acute distress noted.  Darl Pikes, RN currently at bedside.  Will continue to monitor.

## 2011-03-13 NOTE — ED Notes (Signed)
Patient currently resting quietly in bed; no respiratory or acute distress noted.  Patient updated on plan of care; informed patient that a bed is ready and report has been called.  Patient has no other questions or concerns at this time.  Will continue to monitor.

## 2011-03-13 NOTE — ED Notes (Signed)
Patient given peanut butter crackers; OKed by resident.

## 2011-03-13 NOTE — ED Notes (Signed)
Calling report now. 

## 2011-03-14 ENCOUNTER — Encounter (HOSPITAL_COMMUNITY): Payer: Self-pay | Admitting: *Deleted

## 2011-03-14 LAB — BASIC METABOLIC PANEL
BUN: 38 mg/dL — ABNORMAL HIGH (ref 6–23)
CO2: 31 mEq/L (ref 19–32)
Chloride: 93 mEq/L — ABNORMAL LOW (ref 96–112)
Creatinine, Ser: 6.41 mg/dL — ABNORMAL HIGH (ref 0.50–1.10)
Potassium: 4.5 mEq/L (ref 3.5–5.1)

## 2011-03-14 LAB — COMPREHENSIVE METABOLIC PANEL
ALT: 14 U/L (ref 0–35)
Alkaline Phosphatase: 181 U/L — ABNORMAL HIGH (ref 39–117)
CO2: 23 mEq/L (ref 19–32)
GFR calc Af Amer: 9 mL/min — ABNORMAL LOW (ref >90)
GFR calc non Af Amer: 8 mL/min — ABNORMAL LOW (ref >90)
Glucose, Bld: 116 mg/dL — ABNORMAL HIGH (ref 70–99)
Potassium: 3 mEq/L — ABNORMAL LOW (ref 3.5–5.1)
Sodium: 140 mEq/L (ref 135–145)

## 2011-03-14 LAB — CBC
HCT: 38.6 % (ref 36.0–46.0)
Hemoglobin: 11.9 g/dL — ABNORMAL LOW (ref 12.0–15.0)
MCV: 89.8 fL (ref 78.0–100.0)
RDW: 16.8 % — ABNORMAL HIGH (ref 11.5–15.5)
WBC: 3.5 10*3/uL — ABNORMAL LOW (ref 4.0–10.5)

## 2011-03-14 LAB — GLUCOSE, CAPILLARY: Glucose-Capillary: 113 mg/dL — ABNORMAL HIGH (ref 70–99)

## 2011-03-14 MED ORDER — RENA-VITE PO TABS
1.0000 | ORAL_TABLET | Freq: Every day | ORAL | Status: DC
  Administered 2011-03-14 – 2011-03-16 (×3): 1 via ORAL
  Filled 2011-03-14 (×4): qty 1

## 2011-03-14 MED ORDER — WARFARIN SODIUM 6 MG PO TABS
7.0000 mg | ORAL_TABLET | Freq: Every day | ORAL | Status: DC
  Filled 2011-03-14: qty 1

## 2011-03-14 MED ORDER — CAMPHOR-MENTHOL 0.5-0.5 % EX LOTN
1.0000 "application " | TOPICAL_LOTION | Freq: Three times a day (TID) | CUTANEOUS | Status: DC | PRN
  Filled 2011-03-14: qty 222

## 2011-03-14 MED ORDER — CALCIUM ACETATE 667 MG PO CAPS
667.0000 mg | ORAL_CAPSULE | Freq: Three times a day (TID) | ORAL | Status: DC
  Administered 2011-03-14 – 2011-03-17 (×8): 667 mg via ORAL
  Filled 2011-03-14 (×13): qty 1

## 2011-03-14 MED ORDER — DOCUSATE SODIUM 283 MG RE ENEM
1.0000 | ENEMA | RECTAL | Status: DC | PRN
  Filled 2011-03-14: qty 1

## 2011-03-14 MED ORDER — VANCOMYCIN HCL IN DEXTROSE 1-5 GM/200ML-% IV SOLN: 1000.0000 mg | INTRAVENOUS | Status: DC

## 2011-03-14 MED ORDER — SORBITOL 70 % SOLN
30.0000 mL | Status: DC | PRN
  Filled 2011-03-14: qty 30

## 2011-03-14 MED ORDER — HYDROXYZINE HCL 25 MG PO TABS
25.0000 mg | ORAL_TABLET | Freq: Three times a day (TID) | ORAL | Status: DC | PRN
  Filled 2011-03-14: qty 1

## 2011-03-14 MED ORDER — ONDANSETRON HCL 4 MG/2ML IJ SOLN: 4.0000 mg | Freq: Four times a day (QID) | INTRAMUSCULAR | Status: DC | PRN

## 2011-03-14 MED ORDER — CALCIUM CARBONATE 1250 MG/5ML PO SUSP
500.0000 mg | Freq: Four times a day (QID) | ORAL | Status: DC | PRN
  Filled 2011-03-14: qty 5

## 2011-03-14 MED ORDER — ACETAMINOPHEN 325 MG PO TABS: 650.0000 mg | ORAL_TABLET | Freq: Four times a day (QID) | ORAL | Status: DC | PRN

## 2011-03-14 MED ORDER — SODIUM CHLORIDE 0.9 % IV SOLN
250.0000 mL | INTRAVENOUS | Status: DC | PRN
  Administered 2011-03-14: 1000 mL via INTRAVENOUS

## 2011-03-14 MED ORDER — BISACODYL 5 MG PO TBEC: 10.0000 mg | DELAYED_RELEASE_TABLET | Freq: Every day | ORAL | Status: DC | PRN

## 2011-03-14 MED ORDER — ACETAMINOPHEN 650 MG RE SUPP: 650.0000 mg | Freq: Four times a day (QID) | RECTAL | Status: DC | PRN

## 2011-03-14 MED ORDER — TRAZODONE 25 MG HALF TABLET
25.0000 mg | ORAL_TABLET | Freq: Every day | ORAL | Status: DC
  Administered 2011-03-14 – 2011-03-16 (×3): 25 mg via ORAL
  Filled 2011-03-14 (×4): qty 1

## 2011-03-14 MED ORDER — METOPROLOL TARTRATE 50 MG PO TABS
50.0000 mg | ORAL_TABLET | ORAL | Status: DC
  Administered 2011-03-14: 50 mg via ORAL
  Filled 2011-03-14 (×2): qty 1

## 2011-03-14 MED ORDER — PARICALCITOL 5 MCG/ML IV SOLN
1.0000 ug | INTRAVENOUS | Status: DC
  Administered 2011-03-15 – 2011-03-17 (×2): 1 ug via INTRAVENOUS
  Filled 2011-03-14 (×2): qty 0.2

## 2011-03-14 MED ORDER — VANCOMYCIN HCL 500 MG IV SOLR
500.0000 mg | INTRAVENOUS | Status: DC
  Administered 2011-03-15 – 2011-03-17 (×2): 500 mg via INTRAVENOUS
  Filled 2011-03-14 (×3): qty 500

## 2011-03-14 MED ORDER — ZOLPIDEM TARTRATE 5 MG PO TABS: 5.0000 mg | ORAL_TABLET | Freq: Every evening | ORAL | Status: DC | PRN

## 2011-03-14 MED ORDER — WARFARIN SODIUM 10 MG PO TABS
10.0000 mg | ORAL_TABLET | Freq: Once | ORAL | Status: AC
  Administered 2011-03-14: 10 mg via ORAL
  Filled 2011-03-14 (×2): qty 1

## 2011-03-14 MED ORDER — LIDOCAINE-PRILOCAINE 2.5-2.5 % EX CREA
1.0000 "application " | TOPICAL_CREAM | CUTANEOUS | Status: DC
  Administered 2011-03-14: 1 via TOPICAL
  Filled 2011-03-14: qty 5

## 2011-03-14 MED ORDER — SODIUM CHLORIDE 0.9 % IJ SOLN
3.0000 mL | Freq: Two times a day (BID) | INTRAMUSCULAR | Status: DC
  Administered 2011-03-14 – 2011-03-16 (×5): 3 mL via INTRAVENOUS

## 2011-03-14 MED ORDER — OXYCODONE-ACETAMINOPHEN 5-325 MG PO TABS
1.0000 | ORAL_TABLET | ORAL | Status: DC | PRN
  Administered 2011-03-14: 2 via ORAL
  Filled 2011-03-14: qty 2

## 2011-03-14 MED ORDER — INSULIN ASPART 100 UNIT/ML ~~LOC~~ SOLN
0.0000 [IU] | Freq: Three times a day (TID) | SUBCUTANEOUS | Status: DC
  Administered 2011-03-15 – 2011-03-16 (×2): 11 [IU] via SUBCUTANEOUS
  Administered 2011-03-17: 3 [IU] via SUBCUTANEOUS
  Filled 2011-03-14 (×2): qty 3

## 2011-03-14 MED ORDER — DARBEPOETIN ALFA-POLYSORBATE 200 MCG/0.4ML IJ SOLN
200.0000 ug | INTRAMUSCULAR | Status: DC
  Administered 2011-03-15: 200 ug via SUBCUTANEOUS
  Filled 2011-03-14: qty 0.4

## 2011-03-14 MED ORDER — SODIUM CHLORIDE 0.9 % IJ SOLN: 3.0000 mL | INTRAMUSCULAR | Status: DC | PRN

## 2011-03-14 MED ORDER — PANTOPRAZOLE SODIUM 40 MG PO TBEC
40.0000 mg | DELAYED_RELEASE_TABLET | Freq: Every day | ORAL | Status: DC
  Administered 2011-03-14 – 2011-03-17 (×3): 40 mg via ORAL
  Filled 2011-03-14 (×3): qty 1

## 2011-03-14 MED ORDER — RISPERIDONE 0.25 MG PO TABS
0.2500 mg | ORAL_TABLET | Freq: Every day | ORAL | Status: DC
  Administered 2011-03-14 – 2011-03-17 (×3): 0.25 mg via ORAL
  Filled 2011-03-14 (×4): qty 1

## 2011-03-14 MED ORDER — NEPRO/CARBSTEADY PO LIQD
237.0000 mL | Freq: Three times a day (TID) | ORAL | Status: DC
  Administered 2011-03-14 – 2011-03-17 (×5): 237 mL via ORAL
  Filled 2011-03-14 (×7): qty 237

## 2011-03-14 MED ORDER — DOCUSATE SODIUM 100 MG PO CAPS
100.0000 mg | ORAL_CAPSULE | Freq: Two times a day (BID) | ORAL | Status: DC
  Administered 2011-03-14 – 2011-03-16 (×5): 100 mg via ORAL
  Filled 2011-03-14 (×9): qty 1

## 2011-03-14 MED ORDER — HEPARIN SODIUM (PORCINE) 1000 UNIT/ML DIALYSIS
20.0000 [IU]/kg | INTRAMUSCULAR | Status: DC | PRN
  Administered 2011-03-15: 1300 [IU] via INTRAVENOUS_CENTRAL
  Filled 2011-03-14: qty 2

## 2011-03-14 MED ORDER — AMLODIPINE BESYLATE 10 MG PO TABS
10.0000 mg | ORAL_TABLET | Freq: Every day | ORAL | Status: DC
  Administered 2011-03-15: 10 mg via ORAL
  Filled 2011-03-14 (×3): qty 1

## 2011-03-14 MED ORDER — INSULIN GLARGINE 100 UNIT/ML ~~LOC~~ SOLN
5.0000 [IU] | Freq: Every day | SUBCUTANEOUS | Status: DC
  Administered 2011-03-14 – 2011-03-15 (×2): 5 [IU] via SUBCUTANEOUS
  Filled 2011-03-14 (×2): qty 3

## 2011-03-14 MED ORDER — RENA-VITE PO TABS
1.0000 | ORAL_TABLET | Freq: Every day | ORAL | Status: DC
  Administered 2011-03-14: 1 via ORAL
  Filled 2011-03-14 (×2): qty 1

## 2011-03-14 MED ORDER — SIMVASTATIN 20 MG PO TABS
20.0000 mg | ORAL_TABLET | Freq: Every day | ORAL | Status: DC
  Administered 2011-03-14 – 2011-03-16 (×3): 20 mg via ORAL
  Filled 2011-03-14 (×4): qty 1

## 2011-03-14 MED ORDER — METOPROLOL TARTRATE 50 MG PO TABS
50.0000 mg | ORAL_TABLET | ORAL | Status: DC
  Administered 2011-03-17: 50 mg via ORAL
  Filled 2011-03-14 (×3): qty 1

## 2011-03-14 MED ORDER — NEPRO/CARBSTEADY PO LIQD
237.0000 mL | Freq: Three times a day (TID) | ORAL | Status: DC | PRN
  Filled 2011-03-14: qty 237

## 2011-03-14 MED ORDER — ONDANSETRON HCL 4 MG PO TABS: 4.0000 mg | ORAL_TABLET | Freq: Four times a day (QID) | ORAL | Status: DC | PRN

## 2011-03-14 MED ORDER — VANCOMYCIN HCL 1000 MG IV SOLR
1250.0000 mg | Freq: Once | INTRAVENOUS | Status: AC
  Administered 2011-03-14: 1250 mg via INTRAVENOUS
  Filled 2011-03-14: qty 1250

## 2011-03-14 NOTE — Progress Notes (Addendum)
ANTIBIOTIC CONSULT NOTE - INITIAL  Pharmacy Consult for Vancomycin  Indication: cellulitis  Allergies  Allergen Reactions  . Aspirin     Unknown    . Heparin     Unknown    . Minoxidil     Unknown    . Penicillins     Unknown      Patient Measurements: Weight 133 pounds  Vital Signs: Temp: 98.1 F (36.7 C) (01/01 2200) Temp src: Oral (01/01 2200) BP: 121/65 mmHg (01/01 2200) Pulse Rate: 60  (01/01 2200)  Labs:  Basename 03/13/11 1600  WBC 2.9*  HGB 12.8  PLT 84*  LABCREA --  CREATININE 5.02*   Medical History: Past Medical History  Diagnosis Date  . Campath-induced atrial fibrillation   . CAD (coronary artery disease)   . Hypertension   . Anemia   . MRSA bacteremia     hx  . Cellulitis   . GERD (gastroesophageal reflux disease)   . Acetabulum fracture   . Contracture of elbow     L arm  . Dementia   . Diabetes mellitus     on Insulin  . Decubitus ulcer, buttock 02/07/2011    Pt presents with dressing to R buttocks/coccyx.  . Pneumonia   . Blood transfusion   . Stroke ~ 1990's    Left side paralysis.  Left  foot drop., L arm contracted.  . Chronic kidney disease 03/13/11    dialysis at [facility]; TTS,last tx 03/12/11 hemo x 20years    Medications:  Prescriptions prior to admission  Medication Sig Dispense Refill  . amLODipine (NORVASC) 10 MG tablet Take 10 mg by mouth at bedtime.        Marland Kitchen b complex-vitamin c-folic acid (NEPHRO-VITE) 0.8 MG TABS Take 1 tablet by mouth at bedtime.        . bisacodyl (DULCOLAX) 5 MG EC tablet Take 10 mg by mouth daily as needed. For constipation      . calcium acetate (PHOSLO) 667 MG capsule Take 667 mg by mouth 3 (three) times daily with meals.       . calcium carbonate, dosed in mg elemental calcium, 1250 MG/5ML Take 500 mg of elemental calcium by mouth every 6 (six) hours as needed. For acid reflux      . darbepoetin (ARANESP) 200 MCG/0.4ML SOLN Inject 200 mcg into the skin every 7 (seven) days. Takes on  Thursdays.      Marland Kitchen docusate sodium (COLACE) 100 MG capsule Take 100 mg by mouth 2 (two) times daily.       . insulin glargine (LANTUS) 100 UNIT/ML injection Inject 5 Units into the skin at bedtime.       . lidocaine-prilocaine (EMLA) cream Apply 1 application topically 3 (three) times a week. Tues, thurs, and sat 30 mins before dialysis      . metoprolol (LOPRESSOR) 50 MG tablet Take 50 mg by mouth 4 (four) times a week. Take twice daily on Sunday, Monday, Wednesday, Friday      . metoprolol (LOPRESSOR) 50 MG tablet Take 50 mg by mouth 3 (three) times a week. Takes daily after dialysis on Tuesday, Thursday, and Saturday.       . multivitamin (RENA-VIT) TABS tablet Take 1 tablet by mouth at bedtime.  30 tablet  5  . omeprazole (PRILOSEC) 20 MG capsule Take 20 mg by mouth daily.       Marland Kitchen oxyCODONE-acetaminophen (PERCOCET) 5-325 MG per tablet Take 1-2 tablets by mouth every 4 (four) hours as  needed. For pain. 1 tablet for mild/moderate pain; 2 tablets for moderate/severe pain.       Marland Kitchen risperiDONE (RISPERDAL) 0.25 MG tablet Take 0.25 mg by mouth daily.        . simvastatin (ZOCOR) 20 MG tablet Take 20 mg by mouth at bedtime.       . traZODone (DESYREL) 50 MG tablet Take 25 mg by mouth at bedtime.       Marland Kitchen warfarin (COUMADIN) 2 MG tablet Take 2 mg by mouth daily.        Marland Kitchen warfarin (COUMADIN) 5 MG tablet Take 5 mg by mouth daily.         Assessment: 78 yo female with ESRD, cellulitis for empiric vancomycin  Goal of Therapy:  Vancomycin pre-HD level 15-25  Plan:  Vancomycin 1250 mg IV now, then 500 mg IV qHD  Rykker Coviello, Gary Fleet 03/14/2011,1:29 AM  Addendum:  Pt to continue Coumadin.  INR 1.12  Coumadin on hold at [facility] since 12/27, reason not clear, but was to continue 1/1.  Will give Coumadin 10 mg tonight, then resume Coumadin 7 mg daily.  Geannie Risen, PharmD, BCPS 03/14/2011 3:43 AM

## 2011-03-14 NOTE — Progress Notes (Signed)
Inpatient Diabetes Program Recommendations  AACE/ADA: New Consensus Statement on Inpatient Glycemic Control (2009)  Target Ranges:  Prepandial:   less than 140 mg/dL      Peak postprandial:   less than 180 mg/dL (1-2 hours)      Critically ill patients:  140 - 180 mg/dL   Inpatient Diabetes Program Recommendations Correction (SSI): add Novolog sensitive scale TID

## 2011-03-14 NOTE — Progress Notes (Signed)
[facility]  Daily Progress Note  Assessment/Planning: s/p R thigh AVG (02/12/11): likely venous outflow stenosis   Pt will need a R thigh shuntogram, possible intervention which I will schedule for tomorrow if consent can be obtained  I have low suspicion this patient's thigh swelling is due to infection  Subjective    No complaints  Objective Filed Vitals:   03/13/11 2012 03/13/11 2200 03/13/11 2321 03/14/11 0610  BP: 119/54 121/65 121/51 111/33  Pulse: 53 60 62 54  Temp: 97.4 F (36.3 C) 98.1 F (36.7 C) 98.1 F (36.7 C) 97.7 F (36.5 C)  TempSrc: Oral Oral    Resp: 15 18 16 16   SpO2: 97% 97% 98% 99%    Intake/Output Summary (Last 24 hours) at 03/14/11 1132 Last data filed at 03/14/11 0700  Gross per 24 hour  Intake    200 ml  Output      0 ml  Net    200 ml    PULM  CTAB CV  RRR GI  soft, NTND VASC  R groin incision c/d/i (nearly re-epithelized), thigh incision healed, warm R foot, some thigh swelling, no bruit, weak thrill  Laboratory CBC    Component Value Date/Time   WBC 2.9* 03/13/2011 1600   HGB 12.8 03/13/2011 1600   HCT 41.0 03/13/2011 1600   PLT 84* 03/13/2011 1600    BMET    Component Value Date/Time   NA 140 03/14/2011 0705   K 3.0* 03/14/2011 0705   CL 104 03/14/2011 0705   CO2 23 03/14/2011 0705   GLUCOSE 116* 03/14/2011 0705   BUN 31* 03/14/2011 0705   CREATININE 4.92* 03/14/2011 0705   CALCIUM 7.3* 03/14/2011 0705   CALCIUM 8.6 07/07/2009 1725   GFRNONAA 8* 03/14/2011 0705   GFRAA 9* 03/14/2011 0705    Leonides Sake, MD [facility] Office: [phone] Pager: [phone]  03/14/2011, 11:32 AM

## 2011-03-14 NOTE — Progress Notes (Signed)
Utilization Review Completed.[name] T1/04/2011   

## 2011-03-14 NOTE — Consult Note (Signed)
[facility] Renal Consultation Note  Indication for Consultation:  Management of ESRD/hemodialysis; anemia, hypertension/volume and secondary hyperparathyroidism  HPI: Ebony [name] is a 78 y.o. female.   Denies pain in right thigh.  Appetite is good.  Doesn't remember eating, breakfast, but they tell me I ate it.  Ate 100% lunch.  Dialysis Orders: Center: [facility]  on TTS . EDW 62.5 HD Bath 2K 2.5 Ca  Time 4 hr Heparin 1800 with prn small bolus Access right I-J and maturing right thigh AVGG  BFR 400 DFR 800  Zemplar 1 mcg IV/HD Epogen none Units IV/HD  Venofer  None; + MRSA; NOTE HEPARIN IS LISTED AS AN ALLERGY (hit+), BUT GIVEN AT THE DIALYSIS CENTER WHERE IT IS ALSO LISTED AS AN ALLERGY   Past Medical History  Diagnosis Date  . Campath-induced atrial fibrillation   . CAD (coronary artery disease)   . Hypertension   . Anemia   . MRSA bacteremia     hx  . Cellulitis   . GERD (gastroesophageal reflux disease)   . Acetabulum fracture   . Contracture of elbow     L arm  . Dementia   . Diabetes mellitus     on Insulin  . Decubitus ulcer, buttock 02/07/2011    Pt presents with dressing to R buttocks/coccyx.  . Pneumonia   . Blood transfusion   . Stroke ~ 1990's    Left side paralysis.  Left  foot drop., L arm contracted.  . Chronic kidney disease 03/13/11    dialysis at [facility]; TTS,last tx 03/12/11 hemo x 20years   Past Surgical History  Procedure Date  . Arteriovenous graft placement   . Appendectomy   . Cholecystectomy   . Tubal ligation     bilateral  . Abdominal hysterectomy   . Av fistula placement 02/07/2011    Procedure: INSERTION OF ARTERIOVENOUS (AV) GORE-TEX GRAFT THIGH;  Surgeon: Pryor Ochoa, MD;  Location: [facility] OR;  Service: Vascular;  Laterality: Right;  insertion AVGG right thigh  . Av fistula placement 02/12/2011    Procedure: INSERTION OF ARTERIOVENOUS (AV) GORE-TEX GRAFT THIGH;  Surgeon: Nilda Simmer, MD;  Location: [facility] OR;   Service: Vascular;  Laterality: Right;  . Cataract extraction w/ intraocular lens  implant, bilateral    Family History  Problem Relation Age of Onset  . Diabetes Mother   . Hypertension Father   . Anesthesia problems Neg Hx   . Hypotension Neg Hx   . Malignant hyperthermia Neg Hx   . Pseudochol deficiency Neg Hx     reports that she quit smoking about 11 years ago. Her smoking use included Cigarettes. She has a 7.5 pack-year smoking history. She has never used smokeless tobacco. She reports that she drinks alcohol. She reports that she does not use illicit drugs. Allergies  Allergen Reactions  . Aspirin     Unknown    . Heparin     Unknown    . Minoxidil     Unknown    . Penicillins     Unknown     Prior to Admission medications   Medication Sig Start Date End Date Taking? Authorizing Provider  amLODipine (NORVASC) 10 MG tablet Take 10 mg by mouth at bedtime.     Yes Historical Provider, MD  b complex-vitamin c-folic acid (NEPHRO-VITE) 0.8 MG TABS Take 1 tablet by mouth at bedtime.     Yes Historical Provider, MD  bisacodyl (DULCOLAX) 5 MG EC tablet Take 10  mg by mouth daily as needed. For constipation   Yes Historical Provider, MD  calcium acetate (PHOSLO) 667 MG capsule Take 667 mg by mouth 3 (three) times daily with meals.  09/01/10  Yes Historical Provider, MD  calcium carbonate, dosed in mg elemental calcium, 1250 MG/5ML Take 500 mg of elemental calcium by mouth every 6 (six) hours as needed. For acid reflux 01/15/11  Yes Marlowe Shores, PA  darbepoetin (ARANESP) 200 MCG/0.4ML SOLN Inject 200 mcg into the skin every 7 (seven) days. Takes on Thursdays.   Yes Historical Provider, MD  docusate sodium (COLACE) 100 MG capsule Take 100 mg by mouth 2 (two) times daily.    Yes Historical Provider, MD  insulin glargine (LANTUS) 100 UNIT/ML injection Inject 5 Units into the skin at bedtime.    Yes Historical Provider, MD  lidocaine-prilocaine (EMLA) cream Apply 1 application  topically 3 (three) times a week. Tues, thurs, and sat 30 mins before dialysis 09/07/10  Yes Historical Provider, MD  metoprolol (LOPRESSOR) 50 MG tablet Take 50 mg by mouth 4 (four) times a week. Take twice daily on Sunday, Monday, Wednesday, Friday 08/03/10  Yes Historical Provider, MD  metoprolol (LOPRESSOR) 50 MG tablet Take 50 mg by mouth 3 (three) times a week. Takes daily after dialysis on Tuesday, Thursday, and Saturday.    Yes Historical Provider, MD  multivitamin (RENA-VIT) TABS tablet Take 1 tablet by mouth at bedtime. 01/15/11  Yes Amelia Jo Roczniak, PA  omeprazole (PRILOSEC) 20 MG capsule Take 20 mg by mouth daily.    Yes Historical Provider, MD  oxyCODONE-acetaminophen (PERCOCET) 5-325 MG per tablet Take 1-2 tablets by mouth every 4 (four) hours as needed. For pain. 1 tablet for mild/moderate pain; 2 tablets for moderate/severe pain.    Yes Historical Provider, MD  risperiDONE (RISPERDAL) 0.25 MG tablet Take 0.25 mg by mouth daily.     Yes Historical Provider, MD  simvastatin (ZOCOR) 20 MG tablet Take 20 mg by mouth at bedtime.    Yes Historical Provider, MD  traZODone (DESYREL) 50 MG tablet Take 25 mg by mouth at bedtime.    Yes Historical Provider, MD  warfarin (COUMADIN) 2 MG tablet Take 2 mg by mouth daily.     Yes Historical Provider, MD  warfarin (COUMADIN) 5 MG tablet Take 5 mg by mouth daily.     Yes Historical Provider, MD   Prior to Admission:  Prescriptions prior to admission  Medication Sig Dispense Refill  . amLODipine (NORVASC) 10 MG tablet Take 10 mg by mouth at bedtime.        Marland Kitchen b complex-vitamin c-folic acid (NEPHRO-VITE) 0.8 MG TABS Take 1 tablet by mouth at bedtime.        . bisacodyl (DULCOLAX) 5 MG EC tablet Take 10 mg by mouth daily as needed. For constipation      . calcium acetate (PHOSLO) 667 MG capsule Take 667 mg by mouth 3 (three) times daily with meals.       . calcium carbonate, dosed in mg elemental calcium, 1250 MG/5ML Take 500 mg of elemental calcium by  mouth every 6 (six) hours as needed. For acid reflux      . darbepoetin (ARANESP) 200 MCG/0.4ML SOLN Inject 200 mcg into the skin every 7 (seven) days. Takes on Thursdays.      Marland Kitchen docusate sodium (COLACE) 100 MG capsule Take 100 mg by mouth 2 (two) times daily.       . insulin glargine (LANTUS) 100 UNIT/ML injection Inject  5 Units into the skin at bedtime.       . lidocaine-prilocaine (EMLA) cream Apply 1 application topically 3 (three) times a week. Tues, thurs, and sat 30 mins before dialysis      . metoprolol (LOPRESSOR) 50 MG tablet Take 50 mg by mouth 4 (four) times a week. Take twice daily on Sunday, Monday, Wednesday, Friday      . metoprolol (LOPRESSOR) 50 MG tablet Take 50 mg by mouth 3 (three) times a week. Takes daily after dialysis on Tuesday, Thursday, and Saturday.       . multivitamin (RENA-VIT) TABS tablet Take 1 tablet by mouth at bedtime.  30 tablet  5  . omeprazole (PRILOSEC) 20 MG capsule Take 20 mg by mouth daily.       Marland Kitchen oxyCODONE-acetaminophen (PERCOCET) 5-325 MG per tablet Take 1-2 tablets by mouth every 4 (four) hours as needed. For pain. 1 tablet for mild/moderate pain; 2 tablets for moderate/severe pain.       Marland Kitchen risperiDONE (RISPERDAL) 0.25 MG tablet Take 0.25 mg by mouth daily.        . simvastatin (ZOCOR) 20 MG tablet Take 20 mg by mouth at bedtime.       . traZODone (DESYREL) 50 MG tablet Take 25 mg by mouth at bedtime.       Marland Kitchen warfarin (COUMADIN) 2 MG tablet Take 2 mg by mouth daily.        Marland Kitchen warfarin (COUMADIN) 5 MG tablet Take 5 mg by mouth daily.          Labs: Basic Metabolic Panel:  Lab 03/14/11 1610 03/13/11 1600  NA 140 133*  K 3.0* 4.3  CL 104 94*  CO2 23 25  GLUCOSE 116* 181*  BUN 31* 30*  CREATININE 4.92* 5.02*  CALCIUM 7.3* 9.4  ALB -- --  PHOS -- --   Liver Function Tests:  Lab 03/14/11 0705  AST 17  ALT 14  ALKPHOS 181*  BILITOT 0.2*  PROT 6.5  ALBUMIN 2.6*  CBC:  Lab 03/13/11 1600  WBC 2.9*  NEUTROABS 1.0*  HGB 12.8  HCT 41.0    MCV 89.7  PLT 84*  CBG:  Lab 03/14/11 1047 03/14/11 0724 03/13/11 1944  GLUCAP 113* 116* 165*   INR/Prothrombin Time 1.12 - ? If coumain had been on hold. INR was 4.9 12/27 and 3.6 12/29.  ROS: As above. No N, V, D, fever or chills, diarrhea, CP, SOB, cough, pain anywhere. "Hasn't eaten enough to be constipated" Sense of humor intact!   Physical Exam: Filed Vitals:   03/14/11 0610  BP: 111/33  Pulse: 54  Temp: 97.7 F (36.5 C)  Resp: 16     General: Elderly lady dozing in bed, roused easily HEENT: [place]/AT MMM Eyes:sclera clear Neck: no JVD Heart: irreg, irreg,  Lungs: grossly CTA Abdomen: soft NT Extremities: right lower leg 1 + edema; left no edema Skin: warm and dry Neuro: left arm paresis with contracture;alert and talkative and engaging Dialysis Access: right thigh and low leg swollen with leg graft + bruit; no significant edema  Assessment/Plan: 1. Right thigh graft swelling  - eval by Dr. Imogene Burn, for shuntogram; his suspicion is venous outflow stenosis not infx; graft one month old; pharm dosing Vanc. Should be able to access soon 2. ESRD -  TTS per routine, due tomorrow, will use PC 3 Hypertension/volume  -  ok, continue current meds; decrease volume some 4. Anemia  - Hgb > 12 no ESA 5. Metabolic bone  disease -  Continue zemplar 1 6. Nutrition - renal diet + supplements 7. Leukopenia - with change in differential 35 % neutrophils, 40% lymphs and 17 % monos;  WBC 3.0 with normal diff 12/27 60 % neutrophils, 24% lymphs, 8% monos at dialysis center monthly labs - follow;  8. Thrombocytopenia - hx HIT + , but platelets stable with low dose heparin (platelets were 188K 12/27); this sudden drop to 84 K is new; follow ? infxn- on vanc 9. A fib/Chronic coumadin - pharmacy dosing coumadin; 10. DM - SSI  Sheffield Slider, PA-C [facility] Beeper [phone] 03/14/2011, 1:09 PM   Patient seen and examined, agree with above note.   Annie Sable,  MD 03/14/2011

## 2011-03-14 NOTE — Progress Notes (Signed)
Subjective: No specific complaints.  Denies any pain.  Wondering to have pizza for dinner if possible.  Objective: Vital signs in last 24 hours: Filed Vitals:   03/13/11 2321 03/14/11 0610 03/14/11 1100 03/14/11 1400  BP: 121/51 111/33  125/49  Pulse: 62 54  56  Temp: 98.1 F (36.7 C) 97.7 F (36.5 C)  98.2 F (36.8 C)  TempSrc:    Oral  Resp: 16 16  18   Height:   5' 6.14" (1.68 m)   Weight:   64.9 kg (143 lb 1.3 oz)   SpO2: 98% 99%  98%   Weight change:   Intake/Output Summary (Last 24 hours) at 03/14/11 1751 Last data filed at 03/14/11 0700  Gross per 24 hour  Intake    200 ml  Output      0 ml  Net    200 ml    Physical Exam: General: Awake, Oriented, No acute distress. HEENT: EOMI. Neck: Supple CV: S1 and S2 Lungs: Clear to ascultation bilaterally Abdomen: Soft, Nontender, Nondistended, +bowel sounds. Ext: Good pulses. Trace edema.  Left hand contracted.  Lab Results:  [facility] 03/14/11 1502 03/14/11 0705  NA 135 140  K 4.5 3.0*  CL 93* 104  CO2 31 23  GLUCOSE 236* 116*  BUN 38* 31*  CREATININE 6.41* 4.92*  CALCIUM 8.8 7.3*  MG -- --  PHOS -- --    Basename 03/14/11 0705  AST 17  ALT 14  ALKPHOS 181*  BILITOT 0.2*  PROT 6.5  ALBUMIN 2.6*   No results found for this basename: LIPASE:2,AMYLASE:2 in the last 72 hours  Basename 03/14/11 1502 03/13/11 1600  WBC 3.5* 2.9*  NEUTROABS -- 1.0*  HGB 11.9* 12.8  HCT 38.6 41.0  MCV 89.8 89.7  PLT 94* 84*   No results found for this basename: CKTOTAL:3,CKMB:3,CKMBINDEX:3,TROPONINI:3 in the last 72 hours No components found with this basename: POCBNP:3 No results found for this basename: DDIMER:2 in the last 72 hours  Basename 03/14/11 0036  HGBA1C 7.3*   No results found for this basename: CHOL:2,HDL:2,LDLCALC:2,TRIG:2,CHOLHDL:2,LDLDIRECT:2 in the last 72 hours No results found for this basename: TSH,T4TOTAL,FREET3,T3FREE,THYROIDAB in the last 72 hours No results found for this basename:  VITAMINB12:2,FOLATE:2,FERRITIN:2,TIBC:2,IRON:2,RETICCTPCT:2 in the last 72 hours  Micro Results: No results found for this or any previous visit (from the past 240 hour(s)).  Studies/Results: No results found.  Medications: I have reviewed the patient's current medications. Scheduled Meds:   . amLODipine  10 mg Oral QHS  . calcium acetate  667 mg Oral TID WC  . clindamycin (CLEOCIN) IV  600 mg Intravenous Once  . darbepoetin  200 mcg Subcutaneous Q7 days  . docusate sodium  100 mg Oral BID  . feeding supplement (NEPRO CARB STEADY)  237 mL Oral TID WC  . insulin aspart  0-15 Units Subcutaneous TID WC  . insulin glargine  5 Units Subcutaneous QHS  . lidocaine-prilocaine  1 application Topical 3 times weekly  . metoprolol  50 mg Oral 4 times weekly  . metoprolol  50 mg Oral 3 times weekly  . multivitamin  1 tablet Oral Daily  . multivitamin  1 tablet Oral QHS  . pantoprazole  40 mg Oral Q1200  . paricalcitol  1 mcg Intravenous 3 times weekly  . risperiDONE  0.25 mg Oral Daily  . simvastatin  20 mg Oral QHS  . sodium chloride  3 mL Intravenous Q12H  . traZODone  25 mg Oral QHS  . vancomycin  1,250 mg  Intravenous Once  . vancomycin  500 mg Intravenous Q T,Th,Sa-HD  . warfarin  10 mg Oral ONCE-1800  . warfarin  7 mg Oral q1800  . DISCONTD: vancomycin  1,000 mg Intravenous 60 min Pre-Op   Continuous Infusions:  PRN Meds:.sodium chloride, acetaminophen, acetaminophen, bisacodyl, calcium carbonate (dosed in mg elemental calcium), calcium carbonate (dosed in mg elemental calcium), camphor-menthol, docusate sodium, feeding supplement (NEPRO CARB STEADY), heparin, hydrOXYzine, ondansetron (ZOFRAN) IV, ondansetron, oxyCODONE-acetaminophen, sodium chloride, sorbitol, zolpidem  Assessment/Plan: 1. Cellulitis.  Concern for possible infection.  Right thigh shuntogram for tomorrow by vascular surgery.  Continue vancomycin, since 03/13/2010.  2.  End-stage renal disease.  Hemodialysis as per  renal.  3.  Hypertension.  Stable.  4.  Type 2 diabetes with complications.  Stable.  Add sliding scale insulin.  5.  Thrombocytopenia.  Secondary to #1?  History of HIT?  Unable to find HIT being done in our records.  6.  A. Fib.  Rate controlled.  Coumadin dosing as per pharmacy.  7. Leucopenia.  Secondary to #1?  Stable continue to monitor.  8.  History of dementia.  Stable.  9.  History of CVA with left-sided paralysis, left foot drop and left arm contracted.  10.  History of decubitus ulcer.  11.  Prophylaxis.  Coumadin dosing as per pharmacy.  INR subtherapeutic.  12.  Disposition.  Pending.   LOS: 1 day  Ebony [name] A, MD 03/14/2011, 5:51 PM

## 2011-03-15 ENCOUNTER — Encounter (HOSPITAL_COMMUNITY)
Admission: EM | Disposition: A | Discharge status: Skilled Nursing Facility | Payer: Self-pay | Source: Home / Self Care | Attending: Internal Medicine

## 2011-03-15 ENCOUNTER — Encounter: Payer: Self-pay | Admitting: Vascular Surgery

## 2011-03-15 ENCOUNTER — Inpatient Hospital Stay (HOSPITAL_COMMUNITY): Payer: Medicare Other

## 2011-03-15 DIAGNOSIS — T82898A Other specified complication of vascular prosthetic devices, implants and grafts, initial encounter: Secondary | ICD-10-CM

## 2011-03-15 HISTORY — PX: SHUNTOGRAM: SHX5510

## 2011-03-15 LAB — RENAL FUNCTION PANEL
Albumin: 2.8 g/dL — ABNORMAL LOW (ref 3.5–5.2)
BUN: 50 mg/dL — ABNORMAL HIGH (ref 6–23)
CO2: 24 mEq/L (ref 19–32)
Calcium: 8.5 mg/dL (ref 8.4–10.5)
Chloride: 97 mEq/L (ref 96–112)
Creatinine, Ser: 7.4 mg/dL — ABNORMAL HIGH (ref 0.50–1.10)
GFR calc Af Amer: 5 mL/min — ABNORMAL LOW (ref >90)
GFR calc non Af Amer: 5 mL/min — ABNORMAL LOW (ref >90)
Glucose, Bld: 165 mg/dL — ABNORMAL HIGH (ref 70–99)
Phosphorus: 4.5 mg/dL (ref 2.3–4.6)
Potassium: 4.2 mEq/L (ref 3.5–5.1)
Sodium: 139 mEq/L (ref 135–145)

## 2011-03-15 LAB — GLUCOSE, CAPILLARY
Glucose-Capillary: 114 mg/dL — ABNORMAL HIGH (ref 70–99)
Glucose-Capillary: 288 mg/dL — ABNORMAL HIGH (ref 70–99)
Glucose-Capillary: 312 mg/dL — ABNORMAL HIGH (ref 70–99)

## 2011-03-15 LAB — BASIC METABOLIC PANEL
BUN: 48 mg/dL — ABNORMAL HIGH (ref 6–23)
Calcium: 8.7 mg/dL (ref 8.4–10.5)
GFR calc Af Amer: 6 mL/min — ABNORMAL LOW (ref >90)
GFR calc non Af Amer: 5 mL/min — ABNORMAL LOW (ref >90)
Glucose, Bld: 195 mg/dL — ABNORMAL HIGH (ref 70–99)
Potassium: 4.3 mEq/L (ref 3.5–5.1)

## 2011-03-15 LAB — PROTIME-INR
INR: 1.09 (ref 0.00–1.49)
Prothrombin Time: 14.3 seconds (ref 11.6–15.2)

## 2011-03-15 LAB — CBC
HCT: 35.5 % — ABNORMAL LOW (ref 36.0–46.0)
MCH: 27.6 pg (ref 26.0–34.0)
MCHC: 30.7 g/dL (ref 30.0–36.0)
RDW: 16.7 % — ABNORMAL HIGH (ref 11.5–15.5)

## 2011-03-15 SURGERY — SHUNTOGRAM
Anesthesia: Moderate Sedation | Laterality: Right

## 2011-03-15 MED ORDER — WARFARIN SODIUM 10 MG PO TABS
10.0000 mg | ORAL_TABLET | Freq: Once | ORAL | Status: AC
  Administered 2011-03-15: 10 mg via ORAL
  Filled 2011-03-15 (×2): qty 1

## 2011-03-15 MED ORDER — LIDOCAINE HCL (PF) 1 % IJ SOLN
INTRAMUSCULAR | Status: AC
  Filled 2011-03-15: qty 30

## 2011-03-15 MED ORDER — HEPARIN SODIUM (PORCINE) 1000 UNIT/ML IJ SOLN
INTRAMUSCULAR | Status: AC
  Filled 2011-03-15: qty 1

## 2011-03-15 MED ORDER — DARBEPOETIN ALFA-POLYSORBATE 200 MCG/0.4ML IJ SOLN
INTRAMUSCULAR | Status: AC
  Filled 2011-03-15: qty 0.4

## 2011-03-15 MED ORDER — HEPARIN (PORCINE) IN NACL 2-0.9 UNIT/ML-% IJ SOLN
INTRAMUSCULAR | Status: AC
  Filled 2011-03-15: qty 1000

## 2011-03-15 MED ORDER — PARICALCITOL 5 MCG/ML IV SOLN
INTRAVENOUS | Status: AC
  Administered 2011-03-15: 1 ug via INTRAVENOUS
  Filled 2011-03-15: qty 1

## 2011-03-15 NOTE — Progress Notes (Signed)
Subjective: No specific complaints.  Patient had shuntogram today.  Objective: Vital signs in last 24 hours: Filed Vitals:   03/15/11 1056 03/15/11 1102 03/15/11 1149 03/15/11 1400  BP: 116/40 141/57 126/56 116/63  Pulse: 68 65 69 64  Temp:  97.2 F (36.2 C) 97 F (36.1 C)   TempSrc:  Oral Oral   Resp: 18 18 18 16   Height:      Weight:  62.6 kg (138 lb 0.1 oz)    SpO2:  92% 94%    Weight change:   Intake/Output Summary (Last 24 hours) at 03/15/11 1559 Last data filed at 03/15/11 1102  Gross per 24 hour  Intake    243 ml  Output   1622 ml  Net  -1379 ml    Physical Exam: General: Awake, Oriented, No acute distress. HEENT: EOMI. Neck: Supple CV: S1 and S2 Lungs: Clear to ascultation bilaterally Abdomen: Soft, Nontender, Nondistended, +bowel sounds. Ext: Good pulses. Trace edema.  Left hand contracted.  Lab Results:  [facility] 03/15/11 0718 03/15/11 0555  NA 139 137  K 4.2 4.3  CL 97 96  CO2 24 24  GLUCOSE 165* 195*  BUN 50* 48*  CREATININE 7.40* 7.26*  CALCIUM 8.5 8.7  MG -- --  PHOS 4.5 --    Basename 03/15/11 0718 03/14/11 0705  AST -- 17  ALT -- 14  ALKPHOS -- 181*  BILITOT -- 0.2*  PROT -- 6.5  ALBUMIN 2.8* 2.6*   No results found for this basename: LIPASE:2,AMYLASE:2 in the last 72 hours  Basename 03/15/11 0555 03/14/11 1502 03/13/11 1600  WBC 3.7* 3.5* --  NEUTROABS -- -- 1.0*  HGB 10.9* 11.9* --  HCT 35.5* 38.6 --  MCV 89.9 89.8 --  PLT 93* 94* --   No results found for this basename: CKTOTAL:3,CKMB:3,CKMBINDEX:3,TROPONINI:3 in the last 72 hours No components found with this basename: POCBNP:3 No results found for this basename: DDIMER:2 in the last 72 hours  Basename 03/14/11 0036  HGBA1C 7.3*   No results found for this basename: CHOL:2,HDL:2,LDLCALC:2,TRIG:2,CHOLHDL:2,LDLDIRECT:2 in the last 72 hours No results found for this basename: TSH,T4TOTAL,FREET3,T3FREE,THYROIDAB in the last 72 hours No results found for this basename:  VITAMINB12:2,FOLATE:2,FERRITIN:2,TIBC:2,IRON:2,RETICCTPCT:2 in the last 72 hours  Micro Results: No results found for this or any previous visit (from the past 240 hour(s)).  Studies/Results: No results found.  Medications: I have reviewed the patient's current medications. Scheduled Meds:    . amLODipine  10 mg Oral QHS  . calcium acetate  667 mg Oral TID WC  . docusate sodium  100 mg Oral BID  . feeding supplement (NEPRO CARB STEADY)  237 mL Oral TID WC  . heparin      . heparin      . insulin aspart  0-15 Units Subcutaneous TID WC  . insulin glargine  5 Units Subcutaneous QHS  . lidocaine      . lidocaine-prilocaine  1 application Topical 3 times weekly  . metoprolol  50 mg Oral 4 times weekly  . metoprolol  50 mg Oral 3 times weekly  . multivitamin  1 tablet Oral QHS  . pantoprazole  40 mg Oral Q1200  . paricalcitol  1 mcg Intravenous 3 times weekly  . risperiDONE  0.25 mg Oral Daily  . simvastatin  20 mg Oral QHS  . sodium chloride  3 mL Intravenous Q12H  . traZODone  25 mg Oral QHS  . vancomycin  500 mg Intravenous Q T,Th,Sa-HD  . warfarin  10  mg Oral ONCE-1800  . warfarin  10 mg Oral ONCE-1800  . DISCONTD: darbepoetin  200 mcg Subcutaneous Q7 days  . DISCONTD: multivitamin  1 tablet Oral Daily  . DISCONTD: warfarin  7 mg Oral q1800   Continuous Infusions:  PRN Meds:.sodium chloride, acetaminophen, acetaminophen, bisacodyl, calcium carbonate (dosed in mg elemental calcium), calcium carbonate (dosed in mg elemental calcium), camphor-menthol, docusate sodium, feeding supplement (NEPRO CARB STEADY), heparin, hydrOXYzine, ondansetron (ZOFRAN) IV, ondansetron, oxyCODONE-acetaminophen, sodium chloride, sorbitol, zolpidem  Assessment/Plan: 1. Cellulitis.  Concern for possible infection.  Right thigh shuntogram on January 3 of 2013 showed 4 cm length common femoral vein stenosis (too high for surgical revision) and widely patent arterial and venous anastomosis.  Continue  vancomycin, since 03/13/2010.  2.  End-stage renal disease.  Hemodialysis as per renal.  3.  Hypertension.  Stable.  4.  Type 2 diabetes with complications.  Stable.  Add sliding scale insulin.  5.  Thrombocytopenia.  Stable.  Secondary to #1?  History of HIT?    6.  A. Fib.  Rate controlled, while having a seizure today patient had gone into different rhythms as a result the patient on cardiac monitor.  Coumadin dosing as per pharmacy, INR still subtherapeutic.  EKG shows first degree AV block otherwise in sinus rhythm.  7. Leucopenia.  Stable continue to monitor.  8.  History of dementia.  Stable.  9.  History of CVA with left-sided paralysis, left foot drop and left arm contracted.  10.  History of decubitus ulcer.  Stable.  11.  Prophylaxis.  Coumadin dosing as per pharmacy.  INR subtherapeutic.  12.  Disposition.  Pending.   LOS: 2 days  Lawanna Cecere A, MD 03/15/2011, 3:59 PM

## 2011-03-15 NOTE — Op Note (Addendum)
OPERATIVE NOTE   PROCEDURE: 1. Right thigh arteriovenous graft cannulation under ultrasound guidance 2. Right thigh shuntogram 3. Angioplasty right common femoral vein x 4  PRE-OPERATIVE DIAGNOSIS: Malfunctioning right thigh arteriovenous graft  POST-OPERATIVE DIAGNOSIS: same as above   SURGEON: Leonides Sake, MD  ANESTHESIA: local  ESTIMATED BLOOD LOSS: 5 cc  FINDING(S): 1. 4 cm length common femoral vein stenosis (too high for surgical revision) 2. Widely patent arterial and venous anastomosis  SPECIMEN(S):  None  CONTRAST: 60 cc  INDICATIONS: Ebony [name] is a 78 y.o. female who  presents with malfunctioning right thigh arteriovenous graft.  The patient is scheduled for right thigh shuntogram with possible intervention.  I discussed with the patient the nature of angiographic procedures, especially the limited patencies of any endovascular intervention.  The patient is aware of that the risks of an angiographic procedure include but are not limited to: bleeding, infection, access site complications, renal failure, embolization, rupture of vessel, dissection, possible need for emergent surgical intervention, possible need for surgical procedures to treat the patient's pathology, and stroke and death.  The patient is aware of the risks and agrees to proceed.  DESCRIPTION: After full informed written consent was obtained, the patient was brought back to the angiography suite and placed supine upon the angiography table.  The patient was connected to monitoring equipment.  The right thigh was prepped and draped in the standard fashion for a right thigh shuntogram.  Under ultrasound guidance, the right thigh arteriovenous graft was cannulated with a micropuncture needle.  The microwire was advanced into the arteriovenous graft and the needle was exchanged for the a microsheath, which was lodged 2 cm into the access.  The wire was removed and the sheath was connected to the IV extension  tubing.  Hand injections were completed to image the access along its venous arm.  Based on the images, this patient will need: angioplasty of the native common femoral vein.  Unfortunately, the length of the stenosis extends into the retroperitoneum so surgical revision cannot be safely completely without extraordinary effort.  The length of the stenosis and position suggests prior catheterization of this vein.  I placed a Benson wire through the sheath into the IVC.  The sheath was exchanged for a 6-Fr sheath which was lodged into the thigh arteriovenous graft.  A 6 mm x 40 mm angioplasty balloon was inflated to treat the area of stenosis.  The first inflation was at 10 atm for 1 minute and the second inflation was at 20 atm for 1.5 minutes.  I also imaged the arterial anastomosis during one of the inflation by refluxing the dye through the arterial arm of the graft: no stenosis was imaged.  Hand injection demonstrated suboptimal response, so I exchanged the balloon for a 8 mm x 40 mm angioplasty balloon.  I inflated the balloon at 10 atm for 1 minute and 20 atm for 1 minutes.  Note, I took care to keep the 8 mm balloon out of the 6 mm graft.  Completion angiogram demonstrated 4-5 mm flow channel in the native common femoral vein, compared to the previous 1-1.5 mm channel..  The balloon and the wire were removed.  A 4-0 Monocryl purse-string suture was sewn around the sheath.  The sheath was removed while tying down the suture.  A sterile bandage was applied to the puncture site.    I would proceed with use of this thigh arteriovenous graft.  If it is unable to support hemodialysis after  this intervention, the next step would be ligation of the right thigh arteriovenous graft.  The extent of the stenosis makes it a poor candidate for surgical revision.    COMPLICATIONS: none  CONDITION: stable  Leonides Sake, MD [facility] Office: [phone] Pager:  [phone]  03/15/2011 1:29 PM

## 2011-03-15 NOTE — Progress Notes (Signed)
Patient was seen on dialysis and the procedure was supervised.  BFR 400  Via PC BP is  128/47.   Patient appears to be tolerating treatment well  Ebony [name] A 03/15/2011

## 2011-03-15 NOTE — Progress Notes (Signed)
Patient admitted from [facility], [place]- message left for family to confirm d/c plans. CSW Psychosocial Assessment  placed in shadow chart in [facility]. Will proceed with SNF search and advise. Reece Levy, MSW, [facility] [phone]

## 2011-03-15 NOTE — Progress Notes (Signed)
Admit Complaint: ?cellulitis Pharmacist System-Based Medication Review: Anticoagulation Patient on warfarin for a fib with INR slowly rising = 1.12. Patient received 7mg  yesterday (10mg  day before). Infectious Disease WBC low Vanc during dialysis 500mg  Cardiovascular NSR Endocrinology Glucose levels being addressed Gastrointestinal / Nutrition Renal supplements Neurology  Nephrology Dialysis 3 times per week. Tolerates well. Shuntogram today, with hopes to go back to regular shunt soon. 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  Assessment: INR low and will increase coumadin to 10mg  today. Check INR in am and adjust dosage as needed.  Plan: Coumadin 10mg  and INR check in am. Last edited by Samella Parr, PHARMD on 03/15/11 at 1221

## 2011-03-15 NOTE — Progress Notes (Signed)
[facility] Progress Note Subjective:  Seen on HD, no new complaints.  I assume for shuntogram later today Objective Vital signs in last 24 hours: Filed Vitals:   03/15/11 0730 03/15/11 0800 03/15/11 0830 03/15/11 0900  BP: 133/58 131/54 129/56 123/51  Pulse: 55 59 63 60  Temp:      TempSrc:      Resp: 18 18 18 18   Height:      Weight:      SpO2:       Weight change:   Intake/Output Summary (Last 24 hours) at 03/15/11 0928 Last data filed at 03/15/11 0700  Gross per 24 hour  Intake    243 ml  Output      0 ml  Net    243 ml   Labs: Basic Metabolic Panel:  Lab 03/15/11 1610 03/15/11 0555 03/14/11 1502  NA 139 137 135  K 4.2 4.3 4.5  CL 97 96 93*  CO2 24 24 31   GLUCOSE 165* 195* 236*  BUN 50* 48* 38*  CREATININE 7.40* 7.26* 6.41*  CALCIUM 8.5 8.7 8.8  ALB -- -- --  PHOS 4.5 -- --   Liver Function Tests:  Lab 03/15/11 0718 03/14/11 0705  AST -- 17  ALT -- 14  ALKPHOS -- 181*  BILITOT -- 0.2*  PROT -- 6.5  ALBUMIN 2.8* 2.6*  CBC:  Lab 03/15/11 0555 03/14/11 1502 03/13/11 1600  WBC 3.7* 3.5* 2.9*  NEUTROABS -- -- 1.0*  HGB 10.9* 11.9* 12.8  HCT 35.5* 38.6 41.0  MCV 89.9 89.8 89.7  PLT 93* 94* 84*  CBG:  Lab 03/14/11 2200 03/14/11 1614 03/14/11 1047 03/14/11 0724 03/13/11 1944  GLUCAP 217* 229* 113* 116* 165*   Medications:      . amLODipine  10 mg Oral QHS  . calcium acetate  667 mg Oral TID WC  . darbepoetin  200 mcg Subcutaneous Q7 days  . docusate sodium  100 mg Oral BID  . feeding supplement (NEPRO CARB STEADY)  237 mL Oral TID WC  . insulin aspart  0-15 Units Subcutaneous TID WC  . insulin glargine  5 Units Subcutaneous QHS  . lidocaine-prilocaine  1 application Topical 3 times weekly  . metoprolol  50 mg Oral 4 times weekly  . metoprolol  50 mg Oral 3 times weekly  . multivitamin  1 tablet Oral Daily  . multivitamin  1 tablet Oral QHS  . pantoprazole  40 mg Oral Q1200  . paricalcitol  1 mcg Intravenous 3 times weekly  .  risperiDONE  0.25 mg Oral Daily  . simvastatin  20 mg Oral QHS  . sodium chloride  3 mL Intravenous Q12H  . traZODone  25 mg Oral QHS  . vancomycin  500 mg Intravenous Q T,Th,Sa-HD  . warfarin  10 mg Oral ONCE-1800  . warfarin  7 mg Oral q1800  . DISCONTD: vancomycin  1,000 mg Intravenous 60 min Pre-Op    I  have reviewed scheduled and prn medications.  Physical Exam: General: Awake on HD NAD - not as perky as yesterday Heart: irreg irreg  Lungs: grossly CTA Extremities: right lower leg 1 + edema; left no edema Dialysis Access: right thigh and low leg swollen with leg graft + bruit; no significant edema-agree  Problem/Plan: 1. Right thigh graft swelling - eval by Dr. Imogene Burn, for shuntogram today ; his suspicion is venous outflow stenosis not infx; graft one month old; pharm dosing Vanc for ? cellulits. Would like to use  AVG Saturday if ok with Dr. Imogene Burn 2. ESRD - TTS HD today with catheter 3 Hypertension/volume - ok, continue current meds; decrease volume some  4. Anemia - Hgb decrease today . Has been off ESA. (Aranesp ordered by adm MD @ 200; and given- have d/cd)  5. Metabolic bone disease - Continue zemplar 1 P controlled 6. Nutrition - renal diet + supplements  7. Leukopenia - improving  8. Thrombocytopenia - hx HIT + , but platelets stable with low dose heparin (platelets were 188K 12/27); this sudden drop to related to infection. Now 84 to 94. Follow-chronic issue . 9. A fib/Chronic coumadin - pharmacy dosing coumadin; INR low at present 10. DM - SSI    Sheffield Slider, PA-C [facility] Beeper [phone]  03/15/2011,9:28 AM  Patient seen and examined, agree with above note with modifications noted. HD and shuntogram today   Annie Sable, MD 03/15/2011     LOS: 2 days

## 2011-03-16 ENCOUNTER — Ambulatory Visit: Payer: Medicare Other | Admitting: Vascular Surgery

## 2011-03-16 LAB — CBC
Platelets: 79 10*3/uL — ABNORMAL LOW (ref 150–400)
RDW: 17 % — ABNORMAL HIGH (ref 11.5–15.5)
WBC: 4.2 10*3/uL (ref 4.0–10.5)

## 2011-03-16 LAB — GLUCOSE, CAPILLARY
Glucose-Capillary: 153 mg/dL — ABNORMAL HIGH (ref 70–99)
Glucose-Capillary: 317 mg/dL — ABNORMAL HIGH (ref 70–99)
Glucose-Capillary: 102 mg/dL — ABNORMAL HIGH (ref 70–99)
Glucose-Capillary: 296 mg/dL — ABNORMAL HIGH (ref 70–99)

## 2011-03-16 LAB — BASIC METABOLIC PANEL
Chloride: 97 mEq/L (ref 96–112)
GFR calc Af Amer: 9 mL/min — ABNORMAL LOW (ref >90)
Potassium: 4.1 mEq/L (ref 3.5–5.1)
Sodium: 135 mEq/L (ref 135–145)

## 2011-03-16 LAB — PROTIME-INR: INR: 1.13 (ref 0.00–1.49)

## 2011-03-16 MED ORDER — INSULIN GLARGINE 100 UNIT/ML ~~LOC~~ SOLN
10.0000 [IU] | Freq: Every day | SUBCUTANEOUS | Status: DC
  Administered 2011-03-16: 10 [IU] via SUBCUTANEOUS
  Filled 2011-03-16: qty 3

## 2011-03-16 MED ORDER — WARFARIN SODIUM 10 MG PO TABS
10.0000 mg | ORAL_TABLET | Freq: Once | ORAL | Status: AC
  Administered 2011-03-16: 10 mg via ORAL
  Filled 2011-03-16: qty 1

## 2011-03-16 MED ORDER — AMLODIPINE BESYLATE 5 MG PO TABS
5.0000 mg | ORAL_TABLET | Freq: Every day | ORAL | Status: DC
  Administered 2011-03-16: 5 mg via ORAL
  Filled 2011-03-16 (×2): qty 1

## 2011-03-16 NOTE — Progress Notes (Signed)
[facility] Progress Note Subjective:  Had HD and shuntogram yesterday, had PTA of stenosis to outflow vein which hopefully will help swelling.  Also cleared thigh AVG for use.   Objective Vital signs in last 24 hours: Filed Vitals:   03/15/11 1722 03/15/11 2100 03/16/11 0500 03/16/11 1000  BP: 121/77 99/59 104/61 111/62  Pulse: 72 67 62 62  Temp: 97.6 F (36.4 C) 98.6 F (37 C) 98.2 F (36.8 C) 97.2 F (36.2 C)  TempSrc: Oral Oral Oral Oral  Resp: 18 18 18 18   Height:      Weight:      SpO2: 98% 94% 95% 95%   Weight change: -2.3 kg (-5 lb 1.1 oz)  Intake/Output Summary (Last 24 hours) at 03/16/11 1119 Last data filed at 03/16/11 0900  Gross per 24 hour  Intake    480 ml  Output      0 ml  Net    480 ml   Labs: Basic Metabolic Panel:  Lab 03/16/11 8295 03/15/11 0718 03/15/11 0555  NA 135 139 137  K 4.1 4.2 4.3  CL 97 97 96  CO2 27 24 24   GLUCOSE 152* 165* 195*  BUN 35* 50* 48*  CREATININE 4.98* 7.40* 7.26*  CALCIUM 8.8 8.5 8.7  ALB -- -- --  PHOS -- 4.5 --   Liver Function Tests:  Lab 03/15/11 0718 03/14/11 0705  AST -- 17  ALT -- 14  ALKPHOS -- 181*  BILITOT -- 0.2*  PROT -- 6.5  ALBUMIN 2.8* 2.6*  CBC:  Lab 03/16/11 0520 03/15/11 0555 03/14/11 1502 03/13/11 1600  WBC 4.2 3.7* 3.5* --  NEUTROABS -- -- -- 1.0*  HGB 11.7* 10.9* 11.9* --  HCT 37.9 35.5* 38.6 --  MCV 89.4 89.9 89.8 89.7  PLT 79* 93* 94* --  CBG:  Lab 03/15/11 2112 03/15/11 1836 03/15/11 1703 03/15/11 1413 03/14/11 2200  GLUCAP 117* 312* 288* 114* 217*   Medications:      . amLODipine  10 mg Oral QHS  . calcium acetate  667 mg Oral TID WC  . docusate sodium  100 mg Oral BID  . feeding supplement (NEPRO CARB STEADY)  237 mL Oral TID WC  . heparin      . heparin      . insulin aspart  0-15 Units Subcutaneous TID WC  . insulin glargine  5 Units Subcutaneous QHS  . lidocaine      . lidocaine-prilocaine  1 application Topical 3 times weekly  . metoprolol  50 mg  Oral 4 times weekly  . metoprolol  50 mg Oral 3 times weekly  . multivitamin  1 tablet Oral QHS  . pantoprazole  40 mg Oral Q1200  . paricalcitol  1 mcg Intravenous 3 times weekly  . risperiDONE  0.25 mg Oral Daily  . simvastatin  20 mg Oral QHS  . sodium chloride  3 mL Intravenous Q12H  . traZODone  25 mg Oral QHS  . vancomycin  500 mg Intravenous Q T,Th,Sa-HD  . warfarin  10 mg Oral ONCE-1800  . warfarin  10 mg Oral ONCE-1800  . DISCONTD: warfarin  7 mg Oral q1800    I  have reviewed scheduled and prn medications.  Physical Exam: General: Awake sitting in bedside chair - no new complaints Heart: irreg irreg  Lungs: grossly CTA Extremities: right lower leg 1 + edema; left no edema Dialysis Access: right thigh and low leg swollen with leg graft + bruit  Problem/Plan: 1. Right thigh graft/leg swelling - s/p PTA of outflow vein, cleared for use. Infection less likely but I think OK to treat with short term vanc, can give at OP unit. Ms. Guastella would feel better if we used the AVG for the first time here in the hospital so will plan on that tomorrow 2. ESRD - TTS HD, will plan to use thigh AVG with AM treatment 3 Hypertension/volume - ok, will decrease amlodipine to 1/2 dose; decreased volume some  4. Anemia - Hgb decrease today . Has been off ESA. (Aranesp ordered by adm MD @ 200; and given- have d/cd)  5. Metabolic bone disease - Continue zemplar 1 P controlled 6. Nutrition - renal diet + supplements  7. Leukopenia - improving  8. Thrombocytopenia - hx HIT + , but platelets low here, holding heparin at present.  Will need to monitor platelet counts as OP  9. A fib/Chronic coumadin - pharmacy dosing coumadin, can let it drift out as OP 10. DM - SSI 11. Dispo- will plan first stick of AVG tomorrow AM.  Should be able to go home after that even if stick is not successful, will continue to try as OP.  Will keep PC in until confident that AVG will work long  term    Pacey Willadsen A     LOS: 3 days

## 2011-03-16 NOTE — Progress Notes (Signed)
Subjective: No specific complaints.   Objective: Vital signs in last 24 hours: Filed Vitals:   03/15/11 2100 03/16/11 0500 03/16/11 1000 03/16/11 1400  BP: 99/59 104/61 111/62 119/66  Pulse: 67 62 62 64  Temp: 98.6 F (37 C) 98.2 F (36.8 C) 97.2 F (36.2 C) 98.2 F (36.8 C)  TempSrc: Oral Oral Oral Oral  Resp: 18 18 18 20   Height:      Weight:      SpO2: 94% 95% 95% 98%   Weight change: -2.3 kg (-5 lb 1.1 oz)  Intake/Output Summary (Last 24 hours) at 03/16/11 1621 Last data filed at 03/16/11 1500  Gross per 24 hour  Intake    720 ml  Output      0 ml  Net    720 ml    Physical Exam: General: Awake, Oriented, No acute distress. CV: S1 and S2 Lungs: Clear to ascultation bilaterally Abdomen: Soft, Nontender, Nondistended, +bowel sounds. Ext: Good pulses. Trace edema.  Left hand contracted.  Lab Results:  [facility] 03/16/11 0520 03/15/11 0718  NA 135 139  K 4.1 4.2  CL 97 97  CO2 27 24  GLUCOSE 152* 165*  BUN 35* 50*  CREATININE 4.98* 7.40*  CALCIUM 8.8 8.5  MG -- --  PHOS -- 4.5    Basename 03/15/11 0718 03/14/11 0705  AST -- 17  ALT -- 14  ALKPHOS -- 181*  BILITOT -- 0.2*  PROT -- 6.5  ALBUMIN 2.8* 2.6*     Basename 03/16/11 0520 03/15/11 0555  WBC 4.2 3.7*  NEUTROABS -- --  HGB 11.7* 10.9*  HCT 37.9 35.5*  MCV 89.4 89.9  PLT 79* 93*       Basename 03/14/11 0036  HGBA1C 7.3*       Medications: I have reviewed the patient's current medications. Scheduled Meds:    . amLODipine  5 mg Oral QHS  . calcium acetate  667 mg Oral TID WC  . docusate sodium  100 mg Oral BID  . feeding supplement (NEPRO CARB STEADY)  237 mL Oral TID WC  . insulin aspart  0-15 Units Subcutaneous TID WC  . insulin glargine  10 Units Subcutaneous QHS  . lidocaine-prilocaine  1 application Topical 3 times weekly  . metoprolol  50 mg Oral 4 times weekly  . metoprolol  50 mg Oral 3 times weekly  . multivitamin  1 tablet Oral QHS  . pantoprazole  40 mg Oral  Q1200  . paricalcitol  1 mcg Intravenous 3 times weekly  . risperiDONE  0.25 mg Oral Daily  . simvastatin  20 mg Oral QHS  . sodium chloride  3 mL Intravenous Q12H  . traZODone  25 mg Oral QHS  . vancomycin  500 mg Intravenous Q T,Th,Sa-HD  . warfarin  10 mg Oral ONCE-1800  . warfarin  10 mg Oral ONCE-1800  . DISCONTD: amLODipine  10 mg Oral QHS  . DISCONTD: insulin glargine  5 Units Subcutaneous QHS   Continuous Infusions:  PRN Meds:.sodium chloride, acetaminophen, acetaminophen, bisacodyl, calcium carbonate (dosed in mg elemental calcium), calcium carbonate (dosed in mg elemental calcium), camphor-menthol, docusate sodium, feeding supplement (NEPRO CARB STEADY), hydrOXYzine, ondansetron (ZOFRAN) IV, ondansetron, oxyCODONE-acetaminophen, sodium chloride, sorbitol, zolpidem, DISCONTD: heparin  Assessment/Plan: 1. Cellulitis.  Concern for possible infection.  Right thigh shuntogram on January 3 of 2013 showed 4 cm length common femoral vein stenosis (too high for surgical revision) and widely patent arterial and venous anastomosis.  Continue vancomycin, since 03/13/2010.AVG ready to  use tomorrow.  2.  End-stage renal disease.  Hemodialysis as per renal.  3.  Hypertension.  Stable.  4.  Type 2 diabetes with complications.  Stable.  Add sliding scale insulin.  5.  Thrombocytopenia.  Stable.  Secondary to #1?  History of HIT?    6.  A. Fib.  Rate controlled.  7. Leucopenia.  Stable continue to monitor.  8.  History of dementia.  Stable.  9.  History of CVA with left-sided paralysis, left foot drop and left arm contracted.  10.  History of decubitus ulcer.  Stable.  11.  Prophylaxis.  Coumadin dosing as per pharmacy.  INR subtherapeutic.  12.  Disposition-possible discharge tomorrow if stabel   LOS: 3 days  Wilson Singer, MD 03/16/2011, 4:21 PM

## 2011-03-16 NOTE — Progress Notes (Signed)
[facility]  Daily Progress Note  Assessment/Planning: POD #1 s/p R thigh AVG venous outflow PTA   May attempt to use R thigh graft: may be difficult to cannulate due to subcut. edema  The long-term patency of this graft is compromised by the common femoral vein stenosis, which should not be stented to avoid compromise of the venous outflow of this leg.  Subjective    No events  Objective Filed Vitals:   03/15/11 1600 03/15/11 1722 03/15/11 2100 03/16/11 0500  BP: 107/73 121/77 99/59 104/61  Pulse: 70 72 67 62  Temp: 98.2 F (36.8 C) 97.6 F (36.4 C) 98.6 F (37 C) 98.2 F (36.8 C)  TempSrc: Oral Oral Oral Oral  Resp: 18 18 18 18   Height:      Weight:      SpO2: 97% 98% 94% 95%    Intake/Output Summary (Last 24 hours) at 03/16/11 0804 Last data filed at 03/15/11 2135  Gross per 24 hour  Intake    240 ml  Output   1622 ml  Net  -1382 ml    PULM  CTAB CV  RRR GI  soft, NTND VASC  R thigh AVG, no easily palpable thrill, +bruit: stronger than before, warm foot  Laboratory CBC    Component Value Date/Time   WBC 4.2 03/16/2011 0520   HGB 11.7* 03/16/2011 0520   HCT 37.9 03/16/2011 0520   PLT 79* 03/16/2011 0520    BMET    Component Value Date/Time   NA 135 03/16/2011 0520   K 4.1 03/16/2011 0520   CL 97 03/16/2011 0520   CO2 27 03/16/2011 0520   GLUCOSE 152* 03/16/2011 0520   BUN 35* 03/16/2011 0520   CREATININE 4.98* 03/16/2011 0520   CALCIUM 8.8 03/16/2011 0520   CALCIUM 8.6 07/07/2009 1725   GFRNONAA 7* 03/16/2011 0520   GFRAA 9* 03/16/2011 0520    Leonides Sake, MD [facility] Office: [phone] Pager: [phone]  03/16/2011, 8:04 AM

## 2011-03-16 NOTE — ED Provider Notes (Signed)
I saw and evaluated the patient, reviewed the resident's note and I agree with the findings and plan.   Froilan Mclean A. Patrica Duel, MD 03/16/11 [phone]

## 2011-03-16 NOTE — Progress Notes (Signed)
ANTICOAGULATION CONSULT NOTE - Follow Up Consult  Pharmacy Consult for Coumadin Indication: atrial fibrillation  Allergies  Allergen Reactions  . Aspirin     Unknown    . Heparin     Unknown    . Minoxidil     Unknown    . Penicillins     Unknown      Patient Measurements: Height: 5' 6.14" (168 cm) Weight: 138 lb 0.1 oz (62.6 kg) IBW/kg (Calculated) : 59.63   Vital Signs: Temp: 98.2 F (36.8 C) (01/04 0500) Temp src: Oral (01/04 0500) BP: 104/61 mmHg (01/04 0500) Pulse Rate: 62  (01/04 0500)  Labs:  Basename 03/16/11 0520 03/15/11 0718 03/15/11 0555 03/14/11 1502  HGB 11.7* -- 10.9* --  HCT 37.9 -- 35.5* 38.6  PLT 79* -- 93* 94*  APTT -- -- -- --  LABPROT 14.7 -- 14.3 13.7  INR 1.13 -- 1.09 1.03  HEPARINUNFRC -- -- -- --  CREATININE 4.98* 7.40* 7.26* --  CKTOTAL -- -- -- --  CKMB -- -- -- --  TROPONINI -- -- -- --   Estimated Creatinine Clearance: 8.6 ml/min (by C-G formula based on Cr of 4.98).   Medications:  Scheduled:    . amLODipine  10 mg Oral QHS  . calcium acetate  667 mg Oral TID WC  . docusate sodium  100 mg Oral BID  . feeding supplement (NEPRO CARB STEADY)  237 mL Oral TID WC  . heparin      . heparin      . insulin aspart  0-15 Units Subcutaneous TID WC  . insulin glargine  5 Units Subcutaneous QHS  . lidocaine      . lidocaine-prilocaine  1 application Topical 3 times weekly  . metoprolol  50 mg Oral 4 times weekly  . metoprolol  50 mg Oral 3 times weekly  . multivitamin  1 tablet Oral QHS  . pantoprazole  40 mg Oral Q1200  . paricalcitol  1 mcg Intravenous 3 times weekly  . risperiDONE  0.25 mg Oral Daily  . simvastatin  20 mg Oral QHS  . sodium chloride  3 mL Intravenous Q12H  . traZODone  25 mg Oral QHS  . vancomycin  500 mg Intravenous Q T,Th,Sa-HD  . warfarin  10 mg Oral ONCE-1800  . DISCONTD: warfarin  7 mg Oral q1800    Assessment: 78 year old female with suspected cellulitis on vancomycin and atrial fibrillation on  coumadin.   Pharmacist System-Based Medication Review: Anticoagulation: Patient on warfarin for a fib with INR slowly rising = 1.13, Hct 37.9, plts 79 (low on admit- monitor closely), PTA patient was on 7mg  daily.  Infectious Disease: On Vancomycin day#3, receiving 500mg  post HD on T-Th-Sa for cellulitis, afebrile, no cultures available.  Cardiovascular NSR; on  Norvasc, Hydralazine prn Endocrinology  CBGs 113-217 on SSI + Lantus 5, monitor as may need increase in coverage Gastrointestinal / Nutrition Renal diet and supplements Nephrology Dialysis T-Th-S, s/p shuntogram 1/3,  with hopes to go back to regular shunt soon. Last dialysis 1/3-tolerated full session at [facility] Hematology / Oncology:  Platelet counts low on admission due to past HIT and dialysis; Platelets dropping further; has low dose heparin orders per nephrology prn orders- I discontinued as allergy requesting MD signature and left physician sticky note for renal team.  PTA Medication Issues: on warfarin pta for a fib Best Practices: ppx for VTE: Leg is swollen with cellulitis.    Goal of Therapy:  INR 2-3  Plan:  1. Coumadin 10mg  po x1 tonight.  2. Will follow-up daily PT/INR in AM. 3. Continue Vancomycin 500mg  IV post-HD. Consider pre-HD level next week to ensure therapeutic levels.    Fayne Norrie, PharmD, BCPS 03/16/2011,9:45 AM

## 2011-03-17 ENCOUNTER — Inpatient Hospital Stay (HOSPITAL_COMMUNITY): Payer: Medicare Other

## 2011-03-17 LAB — COMPREHENSIVE METABOLIC PANEL
Albumin: 2.7 g/dL — ABNORMAL LOW (ref 3.5–5.2)
BUN: 56 mg/dL — ABNORMAL HIGH (ref 6–23)
Chloride: 97 mEq/L (ref 96–112)
Creatinine, Ser: 6.36 mg/dL — ABNORMAL HIGH (ref 0.50–1.10)
GFR calc Af Amer: 6 mL/min — ABNORMAL LOW (ref >90)
GFR calc non Af Amer: 6 mL/min — ABNORMAL LOW (ref >90)
Glucose, Bld: 182 mg/dL — ABNORMAL HIGH (ref 70–99)
Total Bilirubin: 0.2 mg/dL — ABNORMAL LOW (ref 0.3–1.2)

## 2011-03-17 LAB — CBC
HCT: 37.1 % (ref 36.0–46.0)
MCV: 90 fL (ref 78.0–100.0)
RDW: 16.9 % — ABNORMAL HIGH (ref 11.5–15.5)
WBC: 4 10*3/uL (ref 4.0–10.5)

## 2011-03-17 MED ORDER — VANCOMYCIN HCL 500 MG IV SOLR: INTRAVENOUS | Status: AC

## 2011-03-17 MED ORDER — PARICALCITOL 5 MCG/ML IV SOLN
INTRAVENOUS | Status: AC
  Administered 2011-03-17: 1 ug via INTRAVENOUS
  Filled 2011-03-17: qty 1

## 2011-03-17 MED ORDER — PARICALCITOL 5 MCG/ML IV SOLN: 1.0000 ug | INTRAVENOUS | Status: DC

## 2011-03-17 MED ORDER — NEPRO/CARBSTEADY PO LIQD: 237.0000 mL | Freq: Three times a day (TID) | ORAL | Status: DC | PRN

## 2011-03-17 MED ORDER — WARFARIN SODIUM 10 MG PO TABS
10.0000 mg | ORAL_TABLET | Freq: Once | ORAL | Status: AC
  Administered 2011-03-17: 10 mg via ORAL
  Filled 2011-03-17: qty 1

## 2011-03-17 MED ORDER — AMLODIPINE BESYLATE 5 MG PO TABS: 5.0000 mg | ORAL_TABLET | Freq: Every day | ORAL | Status: DC

## 2011-03-17 MED ORDER — CALCIUM CARBONATE 1250 MG/5ML PO SUSP: 500.0000 mg | Freq: Four times a day (QID) | ORAL | Status: DC | PRN

## 2011-03-17 MED ORDER — NEPRO/CARBSTEADY PO LIQD: 237.0000 mL | Freq: Three times a day (TID) | ORAL | Status: DC

## 2011-03-17 NOTE — Progress Notes (Signed)
Subjective: Seen during dialysis. No specific complaints.  Objective: Vital signs in last 24 hours: Filed Vitals:   03/17/11 0930 03/17/11 1000 03/17/11 1030 03/17/11 1100  BP: 96/50 114/48 104/50 99/46  Pulse: 67 59 66 66  Temp:      TempSrc:      Resp:      Height:      Weight:      SpO2:       Weight change: 3.9 kg (8 lb 9.6 oz)  Intake/Output Summary (Last 24 hours) at 03/17/11 1122 Last data filed at 03/16/11 2100  Gross per 24 hour  Intake    720 ml  Output      0 ml  Net    720 ml    Physical Exam: General: Awake, Oriented, No acute distress. HEENT: EOMI. Neck: Supple CV: S1 and S2 Lungs: Clear to ascultation bilaterally Abdomen: Soft, Nontender, Nondistended, +bowel sounds. Ext: Good pulses. Trace edema.  Left hand contracted.  Lab Results:  Basename 03/17/11 0530 03/16/11 0520 03/15/11 0718  NA 137 135 --  K 4.0 4.1 --  CL 97 97 --  CO2 24 27 --  GLUCOSE 182* 152* --  BUN 56* 35* --  CREATININE 6.36* 4.98* --  CALCIUM 9.2 8.8 --  MG -- -- --  PHOS -- -- 4.5    Basename 03/17/11 0530 03/15/11 0718  AST 18 --  ALT 15 --  ALKPHOS 196* --  BILITOT 0.2* --  PROT 7.1 --  ALBUMIN 2.7* 2.8*   No results found for this basename: LIPASE:2,AMYLASE:2 in the last 72 hours  Basename 03/17/11 0530 03/16/11 0520  WBC 4.0 4.2  NEUTROABS -- --  HGB 11.3* 11.7*  HCT 37.1 37.9  MCV 90.0 89.4  PLT 88* 79*   No results found for this basename: CKTOTAL:3,CKMB:3,CKMBINDEX:3,TROPONINI:3 in the last 72 hours No components found with this basename: POCBNP:3 No results found for this basename: DDIMER:2 in the last 72 hours No results found for this basename: HGBA1C:2 in the last 72 hours No results found for this basename: CHOL:2,HDL:2,LDLCALC:2,TRIG:2,CHOLHDL:2,LDLDIRECT:2 in the last 72 hours No results found for this basename: TSH,T4TOTAL,FREET3,T3FREE,THYROIDAB in the last 72 hours No results found for this basename:  VITAMINB12:2,FOLATE:2,FERRITIN:2,TIBC:2,IRON:2,RETICCTPCT:2 in the last 72 hours  Micro Results: No results found for this or any previous visit (from the past 240 hour(s)).  Studies/Results: No results found.  Medications: I have reviewed the patient's current medications. Scheduled Meds:    . amLODipine  5 mg Oral QHS  . calcium acetate  667 mg Oral TID WC  . docusate sodium  100 mg Oral BID  . feeding supplement (NEPRO CARB STEADY)  237 mL Oral TID WC  . insulin aspart  0-15 Units Subcutaneous TID WC  . insulin glargine  10 Units Subcutaneous QHS  . lidocaine-prilocaine  1 application Topical 3 times weekly  . metoprolol  50 mg Oral 4 times weekly  . metoprolol  50 mg Oral 3 times weekly  . multivitamin  1 tablet Oral QHS  . pantoprazole  40 mg Oral Q1200  . paricalcitol  1 mcg Intravenous 3 times weekly  . risperiDONE  0.25 mg Oral Daily  . simvastatin  20 mg Oral QHS  . sodium chloride  3 mL Intravenous Q12H  . traZODone  25 mg Oral QHS  . vancomycin  500 mg Intravenous Q T,Th,Sa-HD  . warfarin  10 mg Oral ONCE-1800  . DISCONTD: amLODipine  10 mg Oral QHS  . DISCONTD: insulin glargine  5 Units Subcutaneous QHS   Continuous Infusions:  PRN Meds:.sodium chloride, acetaminophen, acetaminophen, bisacodyl, calcium carbonate (dosed in mg elemental calcium), calcium carbonate (dosed in mg elemental calcium), camphor-menthol, docusate sodium, feeding supplement (NEPRO CARB STEADY), hydrOXYzine, ondansetron (ZOFRAN) IV, ondansetron, oxyCODONE-acetaminophen, sodium chloride, sorbitol, zolpidem  Assessment/Plan: 1. Cellulitis.  Concern for possible infection.  Right thigh shuntogram on January 3 of 2013 showed 4 cm length common femoral vein stenosis (too high for surgical revision) and widely patent arterial and venous anastomosis.  Continue vancomycin, since 03/13/2010, define vancomycin for [name] total of 2 weeks.  2.  End-stage renal disease.  Hemodialysis as per renal.  3.   Hypertension.  Stable.  4.  Type 2 diabetes with complications.  Stable.  Add sliding scale insulin.  5.  Thrombocytopenia.  Stable. History of HIT.  6.  [name]. Fib.  Rate controlled, rate controlled.  Coumadin dosing as per pharmacy, INR still subtherapeutic.    7. Leucopenia.  Resolved.  8.  History of dementia.  Stable.  9.  History of CVA with left-sided paralysis, left foot drop and left arm contracted.  10.  History of decubitus ulcer.  Stable.  11.  Prophylaxis.  Coumadin dosing as per pharmacy.  INR subtherapeutic.  12.  Disposition.  DC to SNF today.   LOS: 4 days  Ebony Dever A, MD 03/17/2011, 11:22 AM

## 2011-03-17 NOTE — Progress Notes (Signed)
Subjective:  Generalized weakness with poor appetite; otherwise feeling O.K.  Vital signs in last 24 hours: Filed Vitals:   03/17/11 0745 03/17/11 0800 03/17/11 0830 03/17/11 0900  BP: 103/48 100/50 106/42 110/50  Pulse: 64 64 63 63  Temp:      TempSrc:      Resp:      Height:      Weight:      SpO2:       Weight change: 3.9 kg (8 lb 9.6 oz)  Intake/Output Summary (Last 24 hours) at 03/17/11 0916 Last data filed at 03/16/11 2100  Gross per 24 hour  Intake    720 ml  Output      0 ml  Net    720 ml   Labs: Basic Metabolic Panel:  Lab 03/17/11 3086 03/16/11 0520 03/15/11 0718  NA 137 135 139  K 4.0 4.1 4.2  CL 97 97 97  CO2 24 27 24   GLUCOSE 182* 152* 165*  BUN 56* 35* 50*  CREATININE 6.36* 4.98* 7.40*  CALCIUM 9.2 8.8 8.5  ALB -- -- --  PHOS -- -- 4.5   Liver Function Tests:  Lab 03/17/11 0530 03/15/11 0718 03/14/11 0705  AST 18 -- 17  ALT 15 -- 14  ALKPHOS 196* -- 181*  BILITOT 0.2* -- 0.2*  PROT 7.1 -- 6.5  ALBUMIN 2.7* 2.8* 2.6*   No results found for this basename: LIPASE:3,AMYLASE:3 in the last 168 hours No results found for this basename: AMMONIA:3 in the last 168 hours CBC:  Lab 03/17/11 0530 03/16/11 0520 03/15/11 0555 03/14/11 1502 03/13/11 1600  WBC 4.0 4.2 3.7* -- --  NEUTROABS -- -- -- -- 1.0*  HGB 11.3* 11.7* 10.9* -- --  HCT 37.1 37.9 35.5* -- --  MCV 90.0 89.4 89.9 89.8 89.7  PLT 88* 79* 93* -- --   Cardiac Enzymes: No results found for this basename: CKTOTAL:5,CKMB:5,CKMBINDEX:5,TROPONINI:5 in the last 168 hours CBG:  Lab 03/16/11 2109 03/16/11 1619 03/16/11 1133 03/16/11 0723 03/15/11 2112  GLUCAP 296* 102* 317* 153* 117*    Iron Studies: No results found for this basename: IRON,TIBC,TRANSFERRIN,FERRITIN in the last 72 hours Studies/Results: No results found. Medications:      . amLODipine  5 mg Oral QHS  . calcium acetate  667 mg Oral TID WC  . docusate sodium  100 mg Oral BID  . feeding supplement (NEPRO CARB STEADY)  237  mL Oral TID WC  . insulin aspart  0-15 Units Subcutaneous TID WC  . insulin glargine  10 Units Subcutaneous QHS  . lidocaine-prilocaine  1 application Topical 3 times weekly  . metoprolol  50 mg Oral 4 times weekly  . metoprolol  50 mg Oral 3 times weekly  . multivitamin  1 tablet Oral QHS  . pantoprazole  40 mg Oral Q1200  . paricalcitol  1 mcg Intravenous 3 times weekly  . risperiDONE  0.25 mg Oral Daily  . simvastatin  20 mg Oral QHS  . sodium chloride  3 mL Intravenous Q12H  . traZODone  25 mg Oral QHS  . vancomycin  500 mg Intravenous Q T,Th,Sa-HD  . warfarin  10 mg Oral ONCE-1800  . DISCONTD: amLODipine  10 mg Oral QHS  . DISCONTD: insulin glargine  5 Units Subcutaneous QHS    I  have reviewed scheduled and prn medications.  Physical Exam:  General: comfortable  Heart: irreg irreg with controlled rate  Lungs: grossly CTAB  Extremities: R thigh and RLE 1-2+;  trace LLE  Dialysis Access: right thigh AVG in use with 2 needles; still has RIJ PC   Problem/Plan:  1. Right thigh graft/leg swelling - s/p PTA of outflow vein, cleared for use; empirically on short term Vanc; no redness or clear evidence of infection; follow closely outpt setting  2. ESRD - TTS [facility]  3 Hypertension/volume - chronic low BP; improved with decreased meds; tol current UF goal on HD  4. Anemia - Hgb 11.3; received one dose Aranesp on adm; now off ESA; follow trend.  5. Metabolic bone disease - on Phoslo and Zemplar; follow ca/phos 6. Nutrition - albumin 2.7, appetite poor; on high protein renal diet and supplements; follow  7. Leukopenia - now 4.0 and resolved  8. 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 A fib/Chronic coumadin - on coumadin, INR 1.53; pharmacy dosing 10. DM - CBG with SSI; primary following  11. Dispo- per primary; AVG cannulated successfully with 2 needles this am; cont adv  needles in outpt prior to PC removal. Can cont Vanc in outpt setting if dischargedd  Samuel Germany, FNP-C [facility] Pager [phone]  03/17/2011,9:16 AM  LOS: 4 days   Patient seen and examined and agree with assessment and plan as above.   [name], MD [employer] [phone] cell 03/17/2011, 9:00 PM

## 2011-03-17 NOTE — Progress Notes (Signed)
FOLLOW-UP CONSULT NOTE  Pharmacy Consult for Coumadin & Vancomycin Indication: afib & cellulitis  Assessment: 78 year old female on D#4 vancomycin for cellulitis. Afebrile, WBC wnl, no culture data available. Being discharged today on vanco qHD for total of 2 weeks until 1/15. On coumadin PTA for afib (home dose 7mg  daily), INR today 1.53, being discharged on 5mg  daily with INR follow-up on 1/7. No bleeding issues reported.  Pharmacist System-Based Medication Review: Cardiovascular hypotensive, NSR- on  Norvasc, metoprolol Endocrinology  CBGs 102-317 on SSI + Lantus 10, monitor as may need increase in coverage Gastrointestinal / Nutrition Renal diet and supplements Nephrology Dialysis T-Th-S, s/p shuntogram 1/3,  with hopes to go back to regular shunt soon. HD today-tolerated full session at BFR 250, corrected Ca=10.24 (alb 2.7) Hematology / Oncology:  Platelet counts low on admission due to past HIT and dialysis; Platelets improving Best Practices:home meds addressed, PPI  Goal of Therapy:  INR 2-3 Pre-HD vancomycin level 15-25 mcg/mL   Plan:  1. Coumadin 10mg  po x1 tonight if patient still here. 2. Continue Vancomycin 500mg  IV post-HD if patient still here. Check pre-HD level next week to ensure therapeutic levels.   Ival Bible, PharmD 03/17/2011,11:57 AM  Allergies  Allergen Reactions  . Aspirin     Unknown    . Heparin     Unknown    . Minoxidil     Unknown    . Penicillins     Unknown      Patient Measurements: Height: 5' 6.14" (168 cm) Weight: 146 lb 9.7 oz (66.5 kg) IBW/kg (Calculated) : 59.63   Vital Signs: Temp: 97.8 F (36.6 C) (01/05 0650) Temp src: Oral (01/05 0650) BP: 99/46 mmHg (01/05 1100) Pulse Rate: 66  (01/05 1100)  Labs:  Basename 03/17/11 0530 03/16/11 0520 03/15/11 0718 03/15/11 0555  HGB 11.3* 11.7* -- --  HCT 37.1 37.9 -- 35.5*  PLT 88* 79* -- 93*  APTT -- -- -- --  LABPROT 18.7* 14.7 -- 14.3  INR 1.53* 1.13 -- 1.09    HEPARINUNFRC -- -- -- --  CREATININE 6.36* 4.98* 7.40* --  CKTOTAL -- -- -- --  CKMB -- -- -- --  TROPONINI -- -- -- --   Estimated Creatinine Clearance: 6.7 ml/min (by C-G formula based on Cr of 6.36).   Medications:  Scheduled:     . amLODipine  5 mg Oral QHS  . calcium acetate  667 mg Oral TID WC  . docusate sodium  100 mg Oral BID  . feeding supplement (NEPRO CARB STEADY)  237 mL Oral TID WC  . insulin aspart  0-15 Units Subcutaneous TID WC  . insulin glargine  10 Units Subcutaneous QHS  . lidocaine-prilocaine  1 application Topical 3 times weekly  . metoprolol  50 mg Oral 4 times weekly  . metoprolol  50 mg Oral 3 times weekly  . multivitamin  1 tablet Oral QHS  . pantoprazole  40 mg Oral Q1200  . paricalcitol  1 mcg Intravenous 3 times weekly  . risperiDONE  0.25 mg Oral Daily  . simvastatin  20 mg Oral QHS  . sodium chloride  3 mL Intravenous Q12H  . traZODone  25 mg Oral QHS  . vancomycin  500 mg Intravenous Q T,Th,Sa-HD  . warfarin  10 mg Oral ONCE-1800  . DISCONTD: insulin glargine  5 Units Subcutaneous QHS

## 2011-03-17 NOTE — Discharge Summary (Signed)
Discharge Summary  LEKITA KEREKES MR#: [id number]  DOB:04/24/1929  Date of Admission: 03/13/2011 Date of Discharge: 03/17/2011  Patient's PCP: Johny Sax, MD, MD  Attending Physician:Jone Panebianco A  Consults: Treatment Team:  Cecille Aver, MD, Robbie Lis kidney Nilda Simmer, MD, Vascular surgery  Discharge Diagnoses: Principal Problem:  *Cellulitis Active Problems:  DIABETES MELLITUS, TYPE II, ON INSULIN  HYPERTENSION, UNSPECIFIED  End stage renal disease  Leucopenia  Thrombocytopenia  history of CVA with left hemiparesis. History of decubitus ulcers  Brief Admitting History and Physical 78 year old [place] American female with history of end-stage renal disease and history of prior MRSA infection and CVA  Discharge Medications Current Discharge Medication List    START taking these medications   Details  !! calcium carbonate, dosed in mg elemental calcium, 1250 MG/5ML Take 5 mLs (500 mg of elemental calcium total) by mouth every 6 (six) hours as needed. Qty: 450 mL    !! Nutritional Supplements (FEEDING SUPPLEMENT, NEPRO CARB STEADY,) LIQD Take 237 mLs by mouth 3 (three) times daily as needed (Supplement).    !! Nutritional Supplements (FEEDING SUPPLEMENT, NEPRO CARB STEADY,) LIQD Take 237 mLs by mouth 3 (three) times daily with meals.    paricalcitol (ZEMPLAR) 5 MCG/ML injection Inject 0.2 mLs (1 mcg total) into the vein 3 (three) times a week. Qty: 1 mL    sodium chloride 0.9 % SOLN 100 mL with vancomycin 500 MG SOLR 500 mg into the vein Every Tuesday,Thursday,and Saturday with dialysis. Until 03/27/2011.     !! - Potential duplicate medications found. Please discuss with provider.    CONTINUE these medications which have CHANGED   Details  amLODipine (NORVASC) 5 MG tablet Take 1 tablet (5 mg total) by mouth at bedtime.      CONTINUE these medications which have NOT CHANGED   Details  !! b complex-vitamin c-folic acid (NEPHRO-VITE) 0.8 MG  TABS Take 1 tablet by mouth at bedtime.      bisacodyl (DULCOLAX) 5 MG EC tablet Take 10 mg by mouth daily as needed. For constipation    calcium acetate (PHOSLO) 667 MG capsule Take 667 mg by mouth 3 (three) times daily with meals.     !! calcium carbonate, dosed in mg elemental calcium, 1250 MG/5ML Take 500 mg of elemental calcium by mouth every 6 (six) hours as needed. For acid reflux    darbepoetin (ARANESP) 200 MCG/0.4ML SOLN Inject 200 mcg into the skin every 7 (seven) days. Takes on Thursdays.    docusate sodium (COLACE) 100 MG capsule Take 100 mg by mouth 2 (two) times daily.     insulin glargine (LANTUS) 100 UNIT/ML injection Inject 5 Units into the skin at bedtime.     lidocaine-prilocaine (EMLA) cream Apply 1 application topically 3 (three) times a week. Tues, thurs, and sat 30 mins before dialysis    !! metoprolol (LOPRESSOR) 50 MG tablet Take 50 mg by mouth 4 (four) times a week. Take twice daily on Sunday, Monday, Wednesday, Friday    !! metoprolol (LOPRESSOR) 50 MG tablet Take 50 mg by mouth 3 (three) times a week. Takes daily after dialysis on Tuesday, Thursday, and Saturday.     !! multivitamin (RENA-VIT) TABS tablet Take 1 tablet by mouth at bedtime. Qty: 30 tablet, Refills: 5    omeprazole (PRILOSEC) 20 MG capsule Take 20 mg by mouth daily.     oxyCODONE-acetaminophen (PERCOCET) 5-325 MG per tablet Take 1-2 tablets by mouth every 4 (four) hours as needed. For pain. 1 tablet  for mild/moderate pain; 2 tablets for moderate/severe pain.     risperiDONE (RISPERDAL) 0.25 MG tablet Take 0.25 mg by mouth daily.      simvastatin (ZOCOR) 20 MG tablet Take 20 mg by mouth at bedtime.     traZODone (DESYREL) 50 MG tablet Take 25 mg by mouth at bedtime.     warfarin (COUMADIN) 5 MG tablet Take 5 mg by mouth daily.       !! - Potential duplicate medications found. Please discuss with provider.      Hospital Course: 1. Cellulitis of the right thigh with some concern for  possible graft infection.  Patient was evaluated by vascular surgery, Dr. Imogene Burn.  Patient had right thigh shuntogram on January 3 of 2013 showed 4 cm length common femoral vein stenosis (too high for surgical revision) and widely patent arterial and venous anastomosis.  Dr. Imogene Burn noted that long-term patency of this graft is compromised on common femoral veins and should not be stented to avoid compromise of the venous outflow of the leg.  Patient was started on vancomycin since 03/13/2010, define vancomycin for a total of 2 weeks until 03/27/2011.   2. End-stage renal disease. Hemodialysis as per renal.   3. Hypertension. Stable.  Amlodipine dose decreased.  4. Type 2 diabetes with complications.  Resume home dose of Lantus at discharge.  Further titration to be done as outpatient.   5. Thrombocytopenia. Stable. History of HIT.   6. A. Fib. Rate controlled, rate controlled. Coumadin dosing as per pharmacy, INR still subtherapeutic at discharge.  Patient to have PT/INR checked on 03/19/2011 and have dose adjusted by pharmacy.  7. Leucopenia. Resolved.   8. History of dementia. Stable.   9. History of CVA with left-sided paralysis, left foot drop and left arm contracted.   10. History of decubitus ulcer. Stable.   Day of Discharge BP 99/46  Pulse 66  Temp(Src) 97.8 F (36.6 C) (Oral)  Resp 24  Ht 5' 6.14" (1.68 m)  Wt 66.5 kg (146 lb 9.7 oz)  BMI 23.56 kg/m2  SpO2 97%  Results for orders placed during the hospital encounter of 03/13/11 (from the past 48 hour(s))  GLUCOSE, CAPILLARY     Status: Abnormal   Collection Time   03/15/11  2:13 PM      Component Value Range Comment   Glucose-Capillary 114 (*) 70 - 99 (mg/dL)    Comment 1 Documented in Chart      Comment 2 Notify RN     GLUCOSE, CAPILLARY     Status: Abnormal   Collection Time   03/15/11  5:03 PM      Component Value Range Comment   Glucose-Capillary 288 (*) 70 - 99 (mg/dL)    Comment 1 Documented in Chart      Comment  2 Notify RN     GLUCOSE, CAPILLARY     Status: Abnormal   Collection Time   03/15/11  6:36 PM      Component Value Range Comment   Glucose-Capillary 312 (*) 70 - 99 (mg/dL)    Comment 1 Notify RN      Comment 2 Documented in Chart     GLUCOSE, CAPILLARY     Status: Abnormal   Collection Time   03/15/11  9:12 PM      Component Value Range Comment   Glucose-Capillary 117 (*) 70 - 99 (mg/dL)   PROTIME-INR     Status: Normal   Collection Time   03/16/11  5:20 AM  Component Value Range Comment   Prothrombin Time 14.7  11.6 - 15.2 (seconds)    INR 1.13  0.00 - 1.49    CBC     Status: Abnormal   Collection Time   03/16/11  5:20 AM      Component Value Range Comment   WBC 4.2  4.0 - 10.5 (K/uL)    RBC 4.24  3.87 - 5.11 (MIL/uL)    Hemoglobin 11.7 (*) 12.0 - 15.0 (g/dL)    HCT 08.6  57.8 - 46.9 (%)    MCV 89.4  78.0 - 100.0 (fL)    MCH 27.6  26.0 - 34.0 (pg)    MCHC 30.9  30.0 - 36.0 (g/dL)    RDW 62.9 (*) 52.8 - 15.5 (%)    Platelets 79 (*) 150 - 400 (K/uL) CONSISTENT WITH PREVIOUS RESULT  BASIC METABOLIC PANEL     Status: Abnormal   Collection Time   03/16/11  5:20 AM      Component Value Range Comment   Sodium 135  135 - 145 (mEq/L)    Potassium 4.1  3.5 - 5.1 (mEq/L)    Chloride 97  96 - 112 (mEq/L)    CO2 27  19 - 32 (mEq/L)    Glucose, Bld 152 (*) 70 - 99 (mg/dL)    BUN 35 (*) 6 - 23 (mg/dL)    Creatinine, Ser 4.13 (*) 0.50 - 1.10 (mg/dL)    Calcium 8.8  8.4 - 10.5 (mg/dL)    GFR calc non Af Amer 7 (*) >90 (mL/min)    GFR calc Af Amer 9 (*) >90 (mL/min)   GLUCOSE, CAPILLARY     Status: Abnormal   Collection Time   03/16/11  7:23 AM      Component Value Range Comment   Glucose-Capillary 153 (*) 70 - 99 (mg/dL)    Comment 1 Documented in Chart      Comment 2 Notify RN     GLUCOSE, CAPILLARY     Status: Abnormal   Collection Time   03/16/11 11:33 AM      Component Value Range Comment   Glucose-Capillary 317 (*) 70 - 99 (mg/dL)    Comment 1 Documented in Chart      Comment 2  Notify RN     GLUCOSE, CAPILLARY     Status: Abnormal   Collection Time   03/16/11  4:19 PM      Component Value Range Comment   Glucose-Capillary 102 (*) 70 - 99 (mg/dL)    Comment 1 Documented in Chart      Comment 2 Notify RN     GLUCOSE, CAPILLARY     Status: Abnormal   Collection Time   03/16/11  9:09 PM      Component Value Range Comment   Glucose-Capillary 296 (*) 70 - 99 (mg/dL)   PROTIME-INR     Status: Abnormal   Collection Time   03/17/11  5:30 AM      Component Value Range Comment   Prothrombin Time 18.7 (*) 11.6 - 15.2 (seconds)    INR 1.53 (*) 0.00 - 1.49    CBC     Status: Abnormal   Collection Time   03/17/11  5:30 AM      Component Value Range Comment   WBC 4.0  4.0 - 10.5 (K/uL)    RBC 4.12  3.87 - 5.11 (MIL/uL)    Hemoglobin 11.3 (*) 12.0 - 15.0 (g/dL)    HCT 24.4  01.0 -  46.0 (%)    MCV 90.0  78.0 - 100.0 (fL)    MCH 27.4  26.0 - 34.0 (pg)    MCHC 30.5  30.0 - 36.0 (g/dL)    RDW 14.7 (*) 82.9 - 15.5 (%)    Platelets 88 (*) 150 - 400 (K/uL) CONSISTENT WITH PREVIOUS RESULT  COMPREHENSIVE METABOLIC PANEL     Status: Abnormal   Collection Time   03/17/11  5:30 AM      Component Value Range Comment   Sodium 137  135 - 145 (mEq/L)    Potassium 4.0  3.5 - 5.1 (mEq/L)    Chloride 97  96 - 112 (mEq/L)    CO2 24  19 - 32 (mEq/L)    Glucose, Bld 182 (*) 70 - 99 (mg/dL)    BUN 56 (*) 6 - 23 (mg/dL)    Creatinine, Ser 5.62 (*) 0.50 - 1.10 (mg/dL)    Calcium 9.2  8.4 - 10.5 (mg/dL)    Total Protein 7.1  6.0 - 8.3 (g/dL)    Albumin 2.7 (*) 3.5 - 5.2 (g/dL)    AST 18  0 - 37 (U/L)    ALT 15  0 - 35 (U/L)    Alkaline Phosphatase 196 (*) 39 - 117 (U/L)    Total Bilirubin 0.2 (*) 0.3 - 1.2 (mg/dL)    GFR calc non Af Amer 6 (*) >90 (mL/min)    GFR calc Af Amer 6 (*) >90 (mL/min)     No results found.   Disposition: Skilled nursing facility.  Diet: Renal and diabetic diet  Activity: Resume as tolerated   Follow-up Appts: Discharge Orders    Future Orders Please  Complete By Expires   Increase activity slowly      Discharge instructions      Comments:   Please check PT/INR on 03/19/2011 and have coumadin dose adjusted by pharmacy there after. Die: Renal and diabetic diet.      TESTS THAT NEED FOLLOW-UP None  Time spent on discharge, talking to the patient, and coordinating care: 35 mins.   Signed: Cristal Ford, MD 03/17/2011, 11:30 AM

## 2011-03-17 NOTE — Progress Notes (Signed)
Clinical Social Work was notified by MD patient is ready to DC back to [facility]. CSW faxed necessary paperwork to SNF and notified the patient. CSW called non-emergency ambulance to safely transport patient to SNF. No other CSW needs. CSW signing off. Kathrin Penner, LCSWA Weekend coverage [phone]

## 2011-03-19 LAB — GLUCOSE, CAPILLARY
Glucose-Capillary: 164 mg/dL — ABNORMAL HIGH (ref 70–99)
Glucose-Capillary: 152 mg/dL — ABNORMAL HIGH (ref 70–99)

## 2011-04-16 ENCOUNTER — Other Ambulatory Visit (HOSPITAL_COMMUNITY): Payer: Self-pay | Admitting: Nephrology

## 2011-04-16 DIAGNOSIS — N186 End stage renal disease: Secondary | ICD-10-CM

## 2011-04-18 ENCOUNTER — Ambulatory Visit (HOSPITAL_COMMUNITY): Payer: Medicare Other

## 2011-04-20 ENCOUNTER — Emergency Department (HOSPITAL_COMMUNITY)
Admission: EM | Admit: 2011-04-20 | Discharge: 2011-04-20 | Disposition: A | Discharge status: Skilled Nursing Facility | Payer: Medicare Other | Attending: Emergency Medicine | Admitting: Emergency Medicine

## 2011-04-20 ENCOUNTER — Encounter (HOSPITAL_COMMUNITY): Payer: Self-pay | Admitting: *Deleted

## 2011-04-20 ENCOUNTER — Other Ambulatory Visit: Payer: Self-pay

## 2011-04-20 DIAGNOSIS — I2510 Atherosclerotic heart disease of native coronary artery without angina pectoris: Secondary | ICD-10-CM | POA: Insufficient documentation

## 2011-04-20 DIAGNOSIS — Z8673 Personal history of transient ischemic attack (TIA), and cerebral infarction without residual deficits: Secondary | ICD-10-CM | POA: Insufficient documentation

## 2011-04-20 DIAGNOSIS — N189 Chronic kidney disease, unspecified: Secondary | ICD-10-CM | POA: Insufficient documentation

## 2011-04-20 DIAGNOSIS — N186 End stage renal disease: Secondary | ICD-10-CM

## 2011-04-20 DIAGNOSIS — Z992 Dependence on renal dialysis: Secondary | ICD-10-CM | POA: Insufficient documentation

## 2011-04-20 DIAGNOSIS — I4891 Unspecified atrial fibrillation: Secondary | ICD-10-CM | POA: Insufficient documentation

## 2011-04-20 DIAGNOSIS — M7989 Other specified soft tissue disorders: Secondary | ICD-10-CM

## 2011-04-20 DIAGNOSIS — I129 Hypertensive chronic kidney disease with stage 1 through stage 4 chronic kidney disease, or unspecified chronic kidney disease: Secondary | ICD-10-CM | POA: Insufficient documentation

## 2011-04-20 DIAGNOSIS — F0390 Unspecified dementia without behavioral disturbance: Secondary | ICD-10-CM | POA: Insufficient documentation

## 2011-04-20 DIAGNOSIS — Z79899 Other long term (current) drug therapy: Secondary | ICD-10-CM | POA: Insufficient documentation

## 2011-04-20 DIAGNOSIS — K219 Gastro-esophageal reflux disease without esophagitis: Secondary | ICD-10-CM | POA: Insufficient documentation

## 2011-04-20 DIAGNOSIS — E119 Type 2 diabetes mellitus without complications: Secondary | ICD-10-CM | POA: Insufficient documentation

## 2011-04-20 LAB — DIFFERENTIAL
Basophils Absolute: 0 10*3/uL (ref 0.0–0.1)
Lymphocytes Relative: 26 % (ref 12–46)
Lymphs Abs: 1.3 10*3/uL (ref 0.7–4.0)
Neutrophils Relative %: 58 % (ref 43–77)

## 2011-04-20 LAB — PROTIME-INR
INR: 2.73 — ABNORMAL HIGH (ref 0.00–1.49)
Prothrombin Time: 29.4 seconds — ABNORMAL HIGH (ref 11.6–15.2)

## 2011-04-20 LAB — CBC
Platelets: 114 10*3/uL — ABNORMAL LOW (ref 150–400)
RBC: 3.99 MIL/uL (ref 3.87–5.11)
RDW: 17.6 % — ABNORMAL HIGH (ref 11.5–15.5)
WBC: 4.8 10*3/uL (ref 4.0–10.5)

## 2011-04-20 LAB — BASIC METABOLIC PANEL
Calcium: 9.6 mg/dL (ref 8.4–10.5)
GFR calc Af Amer: 10 mL/min — ABNORMAL LOW (ref >90)
GFR calc non Af Amer: 9 mL/min — ABNORMAL LOW (ref >90)
Sodium: 135 mEq/L (ref 135–145)

## 2011-04-20 LAB — GLUCOSE, CAPILLARY: Glucose-Capillary: 229 mg/dL — ABNORMAL HIGH (ref 70–99)

## 2011-04-20 NOTE — Consult Note (Signed)
[facility] [place] Consultation   Requesting: Ebony [name], ER [facility] Reason for consult: right leg swelling  History of Present Illness:  Patient is a 78 y.o. year old female who presents for evaluation of right leg swelling.  She has a known right thigh graft with recent angioplasty of the venous outflow in January.  She has some dementia but states that the swelling has been present for a few days.  The graft has been working.  She has some tenderness over the right ankle.  She denies swelling in this leg previously.  Other medical problems include a fib for which she is on coumadin, CAD, Anemia, Diabetes.  Chronic hemodialysis [facility].  She lives at [facility].    Past Medical History  Diagnosis Date  . Atrial fibrillation   . CAD (coronary artery disease)   . Hypertension   . Anemia   . MRSA bacteremia     hx  . Cellulitis   . GERD (gastroesophageal reflux disease)   . Acetabulum fracture   . Contracture of elbow     L arm  . Dementia   . Diabetes mellitus     on Insulin  . Decubitus ulcer, buttock 02/07/2011    Pt presents with dressing to R buttocks/coccyx.  . Pneumonia   . Blood transfusion   . Stroke ~ 1990's    Left side paralysis.  Left  foot drop., L arm contracted.  . Chronic kidney disease 03/13/11    dialysis at [facility]; TTS,last tx 03/12/11 hemo x 20years    Past Surgical History  Procedure Date  . Arteriovenous graft placement   . Appendectomy   . Cholecystectomy   . Tubal ligation     bilateral  . Abdominal hysterectomy   . Av fistula placement 02/07/2011    Procedure: INSERTION OF ARTERIOVENOUS (AV) GORE-TEX GRAFT THIGH;  Surgeon: Pryor Ochoa, MD;  Location: [facility] OR;  Service: Vascular;  Laterality: Right;  insertion AVGG right thigh  . Av fistula placement 02/12/2011    Procedure: INSERTION OF ARTERIOVENOUS (AV) GORE-TEX GRAFT THIGH;  Surgeon: Nilda Simmer, MD;  Location: [facility] OR;  Service: Vascular;   Laterality: Right;  . Cataract extraction w/ intraocular lens  implant, bilateral      Social History History  Substance Use Topics  . Smoking status: Former Smoker -- 0.2 packs/day for 30 years    Types: Cigarettes    Quit date: 03/12/2000  . Smokeless tobacco: Never Used  . Alcohol Use: No     "last alcohol ~ 1990"    Family History Family History  Problem Relation Age of Onset  . Diabetes Mother   . Hypertension Father   . Anesthesia problems Neg Hx   . Hypotension Neg Hx   . Malignant hyperthermia Neg Hx   . Pseudochol deficiency Neg Hx     Allergies  Allergies  Allergen Reactions  . Aspirin     Unknown    . Heparin     Unknown    . Minoxidil     Unknown    . Penicillins     Unknown       No current facility-administered medications for this encounter.   Current Outpatient Prescriptions  Medication Sig Dispense Refill  . amLODipine (NORVASC) 5 MG tablet Take 5 mg by mouth at bedtime.      . bisacodyl (DULCOLAX) 5 MG EC tablet Take 10 mg by mouth daily as needed. For constipation      .  calcium acetate (PHOSLO) 667 MG capsule Take 667 mg by mouth 3 (three) times daily with meals.       . calcium carbonate, dosed in mg elemental calcium, 1250 MG/5ML Take 500 mg of elemental calcium by mouth every 6 (six) hours as needed. For acid reflux      . darbepoetin (ARANESP) 200 MCG/0.4ML SOLN Inject 200 mcg into the skin every 7 (seven) days. Takes on Thursdays.      Marland Kitchen docusate sodium (COLACE) 100 MG capsule Take 100 mg by mouth 2 (two) times daily.       . DULoxetine (CYMBALTA) 20 MG capsule Take 20 mg by mouth daily.      . feeding supplement (PRO-STAT SUGAR FREE 64) LIQD Take 30 mLs by mouth 3 (three) times daily with meals.      . insulin glargine (LANTUS) 100 UNIT/ML injection Inject 5 Units into the skin at bedtime.       . lidocaine-prilocaine (EMLA) cream Apply 1 application topically 3 (three) times a week. Tues, thurs, and sat 30 mins before dialysis        . metoprolol tartrate (LOPRESSOR) 25 MG tablet Take 25 mg by mouth 3 (three) times a week. After dialysis on tues, thur, and sat.      . multivitamin (RENA-VIT) TABS tablet Take 1 tablet by mouth at bedtime.  30 tablet  5  . omeprazole (PRILOSEC) 20 MG capsule Take 20 mg by mouth daily.       Marland Kitchen oxyCODONE-acetaminophen (PERCOCET) 5-325 MG per tablet Take 1-2 tablets by mouth every 4 (four) hours as needed. For pain. 1 tablet for mild/moderate pain; 2 tablets for moderate/severe pain.       . paricalcitol (ZEMPLAR) 5 MCG/ML injection Inject 0.2 mLs (1 mcg total) into the vein 3 (three) times a week.  1 mL    . risperiDONE (RISPERDAL) 0.25 MG tablet Take 0.25 mg by mouth at bedtime.       . simvastatin (ZOCOR) 20 MG tablet Take 20 mg by mouth at bedtime.       . Nutritional Supplements (FEEDING SUPPLEMENT, NEPRO CARB STEADY,) LIQD Take 237 mLs by mouth 3 (three) times daily with meals.        ROS:   General:  No weight loss, Fever, chills  HEENT: No recent headaches, no nasal bleeding, no visual changes, no sore throat  Neurologic: No dizziness, blackouts, seizures. No recent symptoms of stroke or mini- stroke. No recent episodes of slurred speech, or temporary blindness.  Cardiac: No recent episodes of chest pain/pressure, no shortness of breath at rest.  No shortness of breath with exertion.    Vascular: No history of rest pain in feet.  No history of claudication.  No history of non-healing ulcer, No history of DVT   Pulmonary: No home oxygen, no productive cough, no hemoptysis,  No asthma or wheezing  Musculoskeletal:  [ ]  Arthritis, [ ]  Low back pain,  [ ]  Joint pain  Hematologic:No history of hypercoagulable state.  No history of easy bleeding.    Gastrointestinal: No hematochezia or melena,  No gastroesophageal reflux, no trouble swallowing  Urinary: [x ] chronic Kidney disease, [ ]  on HD - [ ]  MWF or [x ] TTHS, [ ]  Burning with urination, [ ]  Frequent urination, [ ]  Difficulty  urinating;   Skin: No rashes  Psychological: No history of anxiety,  No history of depression   Physical Examination  Filed Vitals:   04/20/11 1248 04/20/11 1310 04/20/11 1531  04/20/11 1817  BP: 88/26 98/49 91/42  102/51  Pulse: 63 59 57 70  Temp: 98.8 F (37.1 C) 98.4 F (36.9 C)    TempSrc: Oral Oral    Resp: 20 19 20 20   SpO2: 97% 100% 100% 100%    There is no height or weight on file to calculate BMI.  General:  Alert and oriented, no acute distress, mild confusion on her medical history HEENT: Normal Neck: No bruit or JVD Pulmonary: Clear to auscultation bilaterally Cardiac: Regular Rate and Rhythm without murmur Abdomen: Soft, non-tender, non-distended, no mass Skin: No rash, 5 cm area of erythema right lateral malleolus tender to palpation non fluctuant Extremities:  Right thigh graft with audible bruit, diffuse right leg swelling approx 30% larger than left Musculoskeletal: No obvious deformity Neurologic: Upper and lower extremity motor 5/5 and symmetric  DATA:  Venous duplex shows non DVT, patent right thigh graft  ASSESSMENT:  Venous hypertension secondary to outflow stenosis of right thigh graft  PLAN: Will stop coumadin today and schedule for right thigh shuntogram next Wednesday Feb 13  Fabienne Bruns, MD [facility] [place] Office: [phone] Pager: [phone]

## 2011-04-20 NOTE — ED Provider Notes (Cosign Needed)
History     CSN: [id number]  Arrival date & time 04/20/11  1239   First MD Initiated Contact with Patient 04/20/11 1254      Chief Complaint  Patient presents with  . Leg Swelling    (Consider location/radiation/quality/duration/timing/severity/associated sxs/prior treatment) HPI  Ebony [name] is a 78 y.o. female presents with c/o right leg. Swelling leading to desire to be assessed in the ED. The sx(s) have been present for several days. Additional concerns are none. She denies shortness of breath, cough, or chest pain. Causative factors are not known. Palliative factors are nothing helps. The distress associated is mild. The disorder has been present for several days. She is bed bound, due to a prior stroke. She denies fever, chills, nausea or vomiting   Past Medical History  Diagnosis Date  . Atrial fibrillation   . CAD (coronary artery disease)   . Hypertension   . Anemia   . MRSA bacteremia     hx  . Cellulitis   . GERD (gastroesophageal reflux disease)   . Acetabulum fracture   . Contracture of elbow     L arm  . Dementia   . Diabetes mellitus     on Insulin  . Decubitus ulcer, buttock 02/07/2011    Pt presents with dressing to R buttocks/coccyx.  . Pneumonia   . Blood transfusion   . Stroke ~ 1990's    Left side paralysis.  Left  foot drop., L arm contracted.  . Chronic kidney disease 03/13/11    dialysis at [facility]; TTS,last tx 03/12/11 hemo x 20years    Past Surgical History  Procedure Date  . Arteriovenous graft placement   . Appendectomy   . Cholecystectomy   . Tubal ligation     bilateral  . Abdominal hysterectomy   . Av fistula placement 02/07/2011    Procedure: INSERTION OF ARTERIOVENOUS (AV) GORE-TEX GRAFT THIGH;  Surgeon: Pryor Ochoa, MD;  Location: [facility] OR;  Service: Vascular;  Laterality: Right;  insertion AVGG right thigh  . Av fistula placement 02/12/2011    Procedure: INSERTION OF ARTERIOVENOUS (AV) GORE-TEX GRAFT THIGH;  Surgeon:  Nilda Simmer, MD;  Location: [facility] OR;  Service: Vascular;  Laterality: Right;  . Cataract extraction w/ intraocular lens  implant, bilateral     Family History  Problem Relation Age of Onset  . Diabetes Mother   . Hypertension Father   . Anesthesia problems Neg Hx   . Hypotension Neg Hx   . Malignant hyperthermia Neg Hx   . Pseudochol deficiency Neg Hx     History  Substance Use Topics  . Smoking status: Former Smoker -- 0.2 packs/day for 30 years    Types: Cigarettes    Quit date: 03/12/2000  . Smokeless tobacco: Never Used  . Alcohol Use: No     "last alcohol ~ 1990"    OB History    Grav Para Term Preterm Abortions TAB SAB Ect Mult Living                  Review of Systems  All other systems reviewed and are negative.    Allergies  Aspirin; Heparin; Minoxidil; and Penicillins  Home Medications   Current Outpatient Rx  Name Route Sig Dispense Refill  . AMLODIPINE BESYLATE 5 MG PO TABS Oral Take 5 mg by mouth at bedtime.    Marland Kitchen BISACODYL 5 MG PO TBEC Oral Take 10 mg by mouth daily as needed. For constipation    .  CALCIUM ACETATE 667 MG PO CAPS Oral Take 667 mg by mouth 3 (three) times daily with meals.     Marland Kitchen CALCIUM CARBONATE 1250 MG/5ML PO SUSP Oral Take 500 mg of elemental calcium by mouth every 6 (six) hours as needed. For acid reflux    . DARBEPOETIN ALFA-POLYSORBATE 200 MCG/0.4ML IJ SOLN Subcutaneous Inject 200 mcg into the skin every 7 (seven) days. Takes on Thursdays.    . DOCUSATE SODIUM 100 MG PO CAPS Oral Take 100 mg by mouth 2 (two) times daily.     . DULOXETINE HCL 20 MG PO CPEP Oral Take 20 mg by mouth daily.    Marland Kitchen PRO-STAT 64 PO LIQD Oral Take 30 mLs by mouth 3 (three) times daily with meals.    . INSULIN GLARGINE 100 UNIT/ML [place] SOLN Subcutaneous Inject 5 Units into the skin at bedtime.     Marland Kitchen LIDOCAINE-PRILOCAINE 2.5-2.5 % EX CREA Topical Apply 1 application topically 3 (three) times a week. Tues, thurs, and sat 30 mins before dialysis    .  METOPROLOL TARTRATE 25 MG PO TABS Oral Take 25 mg by mouth 3 (three) times a week. After dialysis on tues, thur, and sat.    Marland Kitchen RENA-VITE PO TABS Oral Take 1 tablet by mouth at bedtime. 30 tablet 5  . OMEPRAZOLE 20 MG PO CPDR Oral Take 20 mg by mouth daily.     . OXYCODONE-ACETAMINOPHEN 5-325 MG PO TABS Oral Take 1-2 tablets by mouth every 4 (four) hours as needed. For pain. 1 tablet for mild/moderate pain; 2 tablets for moderate/severe pain.     Marland Kitchen PARICALCITOL 5 MCG/ML IV SOLN Intravenous Inject 0.2 mLs (1 mcg total) into the vein 3 (three) times a week. 1 mL   . RISPERIDONE 0.25 MG PO TABS Oral Take 0.25 mg by mouth at bedtime.     Marland Kitchen SIMVASTATIN 20 MG PO TABS Oral Take 20 mg by mouth at bedtime.     Marland Kitchen NEPRO/CARB STEADY PO LIQD Oral Take 237 mLs by mouth 3 (three) times daily with meals.      BP 99/46  Pulse 69  Temp(Src) 98 F (36.7 C) (Oral)  Resp 20  SpO2 100%  Physical Exam  Nursing note and vitals reviewed. Constitutional: She appears well-developed and well-nourished.  HENT:  Head: Normocephalic and atraumatic.  Eyes: Conjunctivae and EOM are normal. Pupils are equal, round, and reactive to light.  Neck: Normal range of motion and phonation normal. Neck supple.  Cardiovascular: Normal rate, regular rhythm and intact distal pulses.   Pulmonary/Chest: Effort normal and breath sounds normal. She exhibits no tenderness.  Abdominal: Soft. She exhibits no distension. There is no tenderness. There is no guarding.  Musculoskeletal:       Right leg is swollen from the hip down. There is mild redness with localized increased swelling in the right lateral ankle area; there is no associated fluctuance or proximal streaking. There are no distal injury is consistent with a portal of entry for infection.  Neurological: She is alert. She has normal strength.       She has bilateral lower steady weakness. She has a flexion contracture of the left arm.  Skin: Skin is warm and dry.  Psychiatric:  She has a normal mood and affect. Her behavior is normal. Judgment and thought content normal.    ED Course  Procedures (including critical care time)  Labs Reviewed  GLUCOSE, CAPILLARY - Abnormal; Notable for the following:    Glucose-Capillary 229 (*)  All other components within normal limits  BASIC METABOLIC PANEL - Abnormal; Notable for the following:    Chloride 91 (*)    Glucose, Bld 193 (*)    BUN 31 (*)    Creatinine, Ser 4.31 (*)    GFR calc non Af Amer 9 (*)    GFR calc Af Amer 10 (*)    All other components within normal limits  CBC - Abnormal; Notable for the following:    Hemoglobin 11.1 (*)    HCT 35.0 (*)    RDW 17.6 (*)    Platelets 114 (*) CONSISTENT WITH PREVIOUS RESULT   All other components within normal limits  DIFFERENTIAL - Abnormal; Notable for the following:    Monocytes Relative 14 (*)    All other components within normal limits  PROTIME-INR - Abnormal; Notable for the following:    Prothrombin Time 29.4 (*)    INR 2.73 (*)    All other components within normal limits  APTT - Abnormal; Notable for the following:    aPTT 48 (*)    All other components within normal limits   No results found. Vascular Doppler right leg has been performed. There is no evidence for DVT or superficial thrombus.   Date: 04/20/2011  Rate: 59  Rhythm: normal sinus rhythm  QRS Axis: normal  Intervals: normal  ST/T Wave abnormalities: normal  Conduction Disutrbances:first-degree A-V block   Narrative Interpretation: Type 2 block resolved Old EKG Reviewed: changes noted    17:35-Dr. fields from the vascular service is coming to see the patient  18:12- Dr. Darrick Penna . Feels that the patient has vein stenosis in the right groin causing the leg edema. He is scheduling her for out patient, procedure to dilate the vein. He would like her anticoagulation stopped at this time.       1. Leg swelling       MDM  Leg swelling, secondary to venous stenosis. Patient  stable for discharge.        Flint Melter, MD 04/20/11 Carlis Stable  Flint Melter, MD 04/20/11 [phone]

## 2011-04-20 NOTE — ED Notes (Signed)
Spoke with [place] awaiting tech to complete procedure

## 2011-04-20 NOTE — Progress Notes (Signed)
VASCULAR LAB PRELIMINARY  PRELIMINARY  PRELIMINARY  PRELIMINARY  Right lower extremity venous duplex completed.    Preliminary report:  Technically limited by edema. No obvious evidence of DVT or superficial thrombosis.  Proximal vessels not adequately visualized.  Calf vessels not adequately visualized.  HD graft patent.  Area of mixed echoes noted in the groin near the anastomosis.    Terance Hart, RVT 04/20/2011, 3:08 PM

## 2011-04-20 NOTE — ED Notes (Signed)
[facility] HERE TO TRANSPORT PT [facility].

## 2011-04-20 NOTE — ED Notes (Signed)
Pt A&O x 4, pt from [facility] transported to ED via [facility], pt c/o R leg swelling onset 3-4 days ago, pain increasing today, pt has HD access in R leg, NAD

## 2011-04-20 NOTE — ED Notes (Signed)
Called [facility] to transport patient back to [facility].

## 2011-04-26 ENCOUNTER — Encounter (HOSPITAL_COMMUNITY): Payer: Self-pay | Admitting: Pharmacy Technician

## 2011-04-26 ENCOUNTER — Encounter: Payer: Self-pay | Admitting: *Deleted

## 2011-04-26 ENCOUNTER — Other Ambulatory Visit: Payer: Self-pay | Admitting: *Deleted

## 2011-05-01 ENCOUNTER — Ambulatory Visit (HOSPITAL_COMMUNITY)
Admission: RE | Admit: 2011-05-01 | Discharge: 2011-05-01 | Disposition: A | Discharge status: Other Acute Inpatient Hospital | Payer: Medicare Other | Source: Ambulatory Visit | Attending: Surgery | Admitting: Surgery

## 2011-05-01 ENCOUNTER — Encounter (HOSPITAL_COMMUNITY)
Admission: RE | Disposition: A | Discharge status: Nursing Home (Includes ICF) | Payer: Self-pay | Source: Ambulatory Visit | Attending: Surgery

## 2011-05-01 DIAGNOSIS — I82819 Embolism and thrombosis of superficial veins of unspecified lower extremities: Secondary | ICD-10-CM | POA: Insufficient documentation

## 2011-05-01 DIAGNOSIS — Z7901 Long term (current) use of anticoagulants: Secondary | ICD-10-CM | POA: Insufficient documentation

## 2011-05-01 DIAGNOSIS — Y832 Surgical operation with anastomosis, bypass or graft as the cause of abnormal reaction of the patient, or of later complication, without mention of misadventure at the time of the procedure: Secondary | ICD-10-CM | POA: Insufficient documentation

## 2011-05-01 DIAGNOSIS — I129 Hypertensive chronic kidney disease with stage 1 through stage 4 chronic kidney disease, or unspecified chronic kidney disease: Secondary | ICD-10-CM | POA: Insufficient documentation

## 2011-05-01 DIAGNOSIS — D649 Anemia, unspecified: Secondary | ICD-10-CM | POA: Insufficient documentation

## 2011-05-01 DIAGNOSIS — M7989 Other specified soft tissue disorders: Secondary | ICD-10-CM | POA: Insufficient documentation

## 2011-05-01 DIAGNOSIS — I4891 Unspecified atrial fibrillation: Secondary | ICD-10-CM | POA: Insufficient documentation

## 2011-05-01 DIAGNOSIS — T82898A Other specified complication of vascular prosthetic devices, implants and grafts, initial encounter: Secondary | ICD-10-CM

## 2011-05-01 DIAGNOSIS — N189 Chronic kidney disease, unspecified: Secondary | ICD-10-CM | POA: Insufficient documentation

## 2011-05-01 DIAGNOSIS — I2510 Atherosclerotic heart disease of native coronary artery without angina pectoris: Secondary | ICD-10-CM | POA: Insufficient documentation

## 2011-05-01 DIAGNOSIS — E119 Type 2 diabetes mellitus without complications: Secondary | ICD-10-CM | POA: Insufficient documentation

## 2011-05-01 HISTORY — PX: SHUNTOGRAM: SHX5491

## 2011-05-01 LAB — PROTIME-INR
INR: 0.99 (ref 0.00–1.49)
Prothrombin Time: 13.3 seconds (ref 11.6–15.2)

## 2011-05-01 SURGERY — ASSESSMENT, SHUNT FUNCTION, WITH CONTRAST RADIOGRAPHIC STUDY
Anesthesia: LOCAL

## 2011-05-01 MED ORDER — LIDOCAINE HCL (PF) 1 % IJ SOLN
INTRAMUSCULAR | Status: AC
  Filled 2011-05-01: qty 30

## 2011-05-01 NOTE — Op Note (Signed)
[facility] [place]  Patient name: Ebony [name] MRN: [id number] DOB: [birth year] Sex: female  05/01/2011 Pre-operative Diagnosis: Right leg swelling Post-operative diagnosis:  Same Surgeon:  Jorge Ny Procedure Performed:  1.  Ultrasound guided access left thigh AVGG  2.  fistulogram   Indications:  The patient has a right thigh graft which she uses for dialysis. She is undergone angioplasty of the venous outflow tract one month ago. She's had persistent swelling she comes in today for further evaluation.  Procedure:  The patient was identified in the holding area and taken to room 8.  The patient was then placed supine on the table and prepped and draped in the usual sterile fashion.  A time out was called.  Ultrasound was used to evaluate the fistula.  The vein was patent and compressible.  A digital ultrasound image was acquired.  The fistula was then accessed under ultrasound guidance using a micropuncture needle.  An 018 wire was then asvanced without resistance and a micropuncture sheath was placed.  Contrast injections were then performed through the sheath.  Findings:  The right thigh graft is patent however there appears to be occlusion of the venous outflow tract. The outflow is down the femoral vein distally towards the foot. The arteriovenous anastomosis is widely patent.   Intervention:  None  Impression:  #1  patent right thigh graft however there is outflow tract occlusion with outflow distally down the femoral vein towards the foot  #2  the patient will require surgical repair   V. Durene Cal, M.D. [facility] [place] Office: [phone] Pager:  [phone]

## 2011-05-01 NOTE — H&P (View-Only) (Signed)
[facility] [place] Consultation   Requesting: Wentz, ER [place] Reason for consult: right leg swelling  History of Present Illness:  Patient is a 78 y.o. year old female who presents for evaluation of right leg swelling.  She has a known right thigh graft with recent angioplasty of the venous outflow in January.  She has some dementia but states that the swelling has been present for a few days.  The graft has been working.  She has some tenderness over the right ankle.  She denies swelling in this leg previously.  Other medical problems include a fib for which she is on coumadin, CAD, Anemia, Diabetes.  Chronic hemodialysis T Th Sat [address].  She lives at [facility].    Past Medical History  Diagnosis Date  . Atrial fibrillation   . CAD (coronary artery disease)   . Hypertension   . Anemia   . MRSA bacteremia     hx  . Cellulitis   . GERD (gastroesophageal reflux disease)   . Acetabulum fracture   . Contracture of elbow     L arm  . Dementia   . Diabetes mellitus     on Insulin  . Decubitus ulcer, buttock 02/07/2011    Pt presents with dressing to R buttocks/coccyx.  . Pneumonia   . Blood transfusion   . Stroke ~ 1990's    Left side paralysis.  Left  foot drop., L arm contracted.  . Chronic kidney disease 03/13/11    dialysis at [address]; TTS,last tx 03/12/11 hemo x 20years    Past Surgical History  Procedure Date  . Arteriovenous graft placement   . Appendectomy   . Cholecystectomy   . Tubal ligation     bilateral  . Abdominal hysterectomy   . Av fistula placement 02/07/2011    Procedure: INSERTION OF ARTERIOVENOUS (AV) GORE-TEX GRAFT THIGH;  Surgeon: James D Lawson, MD;  Location: [facility] OR;  Service: Vascular;  Laterality: Right;  insertion AVGG right thigh  . Av fistula placement 02/12/2011    Procedure: INSERTION OF ARTERIOVENOUS (AV) GORE-TEX GRAFT THIGH;  Surgeon: Brian Liang-Yu Chen, MD;  Location: [facility] OR;  Service: Vascular;   Laterality: Right;  . Cataract extraction w/ intraocular lens  implant, bilateral      Social History History  Substance Use Topics  . Smoking status: Former Smoker -- 0.2 packs/day for 30 years    Types: Cigarettes    Quit date: 03/12/2000  . Smokeless tobacco: Never Used  . Alcohol Use: No     "last alcohol ~ 1990"    Family History Family History  Problem Relation Age of Onset  . Diabetes Mother   . Hypertension Father   . Anesthesia problems Neg Hx   . Hypotension Neg Hx   . Malignant hyperthermia Neg Hx   . Pseudochol deficiency Neg Hx     Allergies  Allergies  Allergen Reactions  . Aspirin     Unknown    . Heparin     Unknown    . Minoxidil     Unknown    . Penicillins     Unknown       No current facility-administered medications for this encounter.   Current Outpatient Prescriptions  Medication Sig Dispense Refill  . amLODipine (NORVASC) 5 MG tablet Take 5 mg by mouth at bedtime.      . bisacodyl (DULCOLAX) 5 MG EC tablet Take 10 mg by mouth daily as needed. For constipation      .   calcium acetate (PHOSLO) 667 MG capsule Take 667 mg by mouth 3 (three) times daily with meals.       . calcium carbonate, dosed in mg elemental calcium, 1250 MG/5ML Take 500 mg of elemental calcium by mouth every 6 (six) hours as needed. For acid reflux      . darbepoetin (ARANESP) 200 MCG/0.4ML SOLN Inject 200 mcg into the skin every 7 (seven) days. Takes on Thursdays.      . docusate sodium (COLACE) 100 MG capsule Take 100 mg by mouth 2 (two) times daily.       . DULoxetine (CYMBALTA) 20 MG capsule Take 20 mg by mouth daily.      . feeding supplement (PRO-STAT SUGAR FREE 64) LIQD Take 30 mLs by mouth 3 (three) times daily with meals.      . insulin glargine (LANTUS) 100 UNIT/ML injection Inject 5 Units into the skin at bedtime.       . lidocaine-prilocaine (EMLA) cream Apply 1 application topically 3 (three) times a week. Tues, thurs, and sat 30 mins before dialysis        . metoprolol tartrate (LOPRESSOR) 25 MG tablet Take 25 mg by mouth 3 (three) times a week. After dialysis on tues, thur, and sat.      . multivitamin (RENA-VIT) TABS tablet Take 1 tablet by mouth at bedtime.  30 tablet  5  . omeprazole (PRILOSEC) 20 MG capsule Take 20 mg by mouth daily.       . oxyCODONE-acetaminophen (PERCOCET) 5-325 MG per tablet Take 1-2 tablets by mouth every 4 (four) hours as needed. For pain. 1 tablet for mild/moderate pain; 2 tablets for moderate/severe pain.       . paricalcitol (ZEMPLAR) 5 MCG/ML injection Inject 0.2 mLs (1 mcg total) into the vein 3 (three) times a week.  1 mL    . risperiDONE (RISPERDAL) 0.25 MG tablet Take 0.25 mg by mouth at bedtime.       . simvastatin (ZOCOR) 20 MG tablet Take 20 mg by mouth at bedtime.       . Nutritional Supplements (FEEDING SUPPLEMENT, NEPRO CARB STEADY,) LIQD Take 237 mLs by mouth 3 (three) times daily with meals.        ROS:   General:  No weight loss, Fever, chills  HEENT: No recent headaches, no nasal bleeding, no visual changes, no sore throat  Neurologic: No dizziness, blackouts, seizures. No recent symptoms of stroke or mini- stroke. No recent episodes of slurred speech, or temporary blindness.  Cardiac: No recent episodes of chest pain/pressure, no shortness of breath at rest.  No shortness of breath with exertion.    Vascular: No history of rest pain in feet.  No history of claudication.  No history of non-healing ulcer, No history of DVT   Pulmonary: No home oxygen, no productive cough, no hemoptysis,  No asthma or wheezing  Musculoskeletal:  [ ] Arthritis, [ ] Low back pain,  [ ] Joint pain  Hematologic:No history of hypercoagulable state.  No history of easy bleeding.    Gastrointestinal: No hematochezia or melena,  No gastroesophageal reflux, no trouble swallowing  Urinary: [x ] chronic Kidney disease, [ ] on HD - [ ] MWF or [x ] TTHS, [ ] Burning with urination, [ ] Frequent urination, [ ] Difficulty  urinating;   Skin: No rashes  Psychological: No history of anxiety,  No history of depression   Physical Examination  Filed Vitals:   04/20/11 1248 04/20/11 1310 04/20/11 1531   04/20/11 1817  BP: 88/26 98/49 91/42 102/51  Pulse: 63 59 57 70  Temp: 98.8 F (37.1 C) 98.4 F (36.9 C)    TempSrc: Oral Oral    Resp: 20 19 20 20  SpO2: 97% 100% 100% 100%    There is no height or weight on file to calculate BMI.  General:  Alert and oriented, no acute distress, mild confusion on her medical history HEENT: Normal Neck: No bruit or JVD Pulmonary: Clear to auscultation bilaterally Cardiac: Regular Rate and Rhythm without murmur Abdomen: Soft, non-tender, non-distended, no mass Skin: No rash, 5 cm area of erythema right lateral malleolus tender to palpation non fluctuant Extremities:  Right thigh graft with audible bruit, diffuse right leg swelling approx 30% larger than left Musculoskeletal: No obvious deformity Neurologic: Upper and lower extremity motor 5/5 and symmetric  DATA:  Venous duplex shows non DVT, patent right thigh graft  ASSESSMENT:  Venous hypertension secondary to outflow stenosis of right thigh graft  PLAN: Will stop coumadin today and schedule for right thigh shuntogram next Wednesday Feb 13  Tailor Lucking, MD [facility] [place] Office: [phone] Pager: [phone]  

## 2011-05-01 NOTE — Interval H&P Note (Signed)
History and Physical Interval Note:  05/01/2011 9:19 AM  Ebony [name]  has presented today for surgery, with the diagnosis of end stage renal disease  The various methods of treatment have been discussed with the patient and family. After consideration of risks, benefits and other options for treatment, the patient has consented to  Procedure(s) (LRB): SHUNTOGRAM (N/A) as a surgical intervention .  The patients' history has been reviewed, patient examined, no change in status, stable for surgery.  I have reviewed the patients' chart and labs.  Questions were answered to the patient's satisfaction.     Saoirse Legere IV, V. WELL[facility]

## 2011-05-02 LAB — POCT I-STAT, CHEM 8
Calcium, Ion: 1.15 mmol/L (ref 1.12–1.32)
Creatinine, Ser: 6.2 mg/dL — ABNORMAL HIGH (ref 0.50–1.10)
Glucose, Bld: 147 mg/dL — ABNORMAL HIGH (ref 70–99)
Hemoglobin: 11.6 g/dL — ABNORMAL LOW (ref 12.0–15.0)
TCO2: 32 mmol/L (ref 0–100)

## 2011-05-04 ENCOUNTER — Other Ambulatory Visit: Payer: Self-pay | Admitting: *Deleted

## 2011-05-04 ENCOUNTER — Encounter: Payer: Self-pay | Admitting: *Deleted

## 2011-05-11 ENCOUNTER — Encounter (HOSPITAL_COMMUNITY): Payer: Self-pay | Admitting: *Deleted

## 2011-05-11 NOTE — Pre-Procedure Instructions (Signed)
20 Ebony [name]  05/11/2011    Your procedure is scheduled on:  Monday, March 4  Report to [facility] at 5:30 AM.  Call this number if you have problems the morning of surgery: [phone]   Remember:   Do not eat food:After Midnight.  May have clear liquids: up to 4 Hours before arrival.  Clear liquids include soda, tea, black coffee, apple or grape juice, broth.  Take these medicines the morning of surgery with A SIP OF WATER: Cymbalta, Metoprolol, Omeprazole, Percocet if needed   Do not wear jewelry, make-up or nail polish.  Do not wear lotions, powders, or perfumes. You may wear deodorant.  Do not shave 48 hours prior to surgery.  Do not bring valuables to the hospital.  Contacts, dentures or bridgework may not be worn into surgery.  Leave suitcase in the car. After surgery it may be brought to your room.  For patients admitted to the hospital, checkout time is 11:00 AM the day of discharge.   Patients discharged the day of surgery will not be allowed to drive home.  Name and phone number of your driver: [facility]

## 2011-05-13 MED ORDER — VANCOMYCIN HCL IN DEXTROSE 1-5 GM/200ML-% IV SOLN
1000.0000 mg | INTRAVENOUS | Status: AC
  Administered 2011-05-14: 1000 mg via INTRAVENOUS
  Filled 2011-05-13: qty 200

## 2011-05-14 ENCOUNTER — Encounter (HOSPITAL_COMMUNITY)
Admission: RE | Disposition: A | Discharge status: Home / Self Care | Payer: Self-pay | Source: Ambulatory Visit | Attending: Vascular Surgery

## 2011-05-14 ENCOUNTER — Ambulatory Visit (HOSPITAL_COMMUNITY)
Admission: RE | Admit: 2011-05-14 | Discharge: 2011-05-14 | Disposition: A | Discharge status: Home / Self Care | Payer: Medicare Other | Source: Ambulatory Visit | Attending: Vascular Surgery | Admitting: Vascular Surgery

## 2011-05-14 ENCOUNTER — Ambulatory Visit (HOSPITAL_COMMUNITY): Payer: Medicare Other | Admitting: Anesthesiology

## 2011-05-14 ENCOUNTER — Encounter (HOSPITAL_COMMUNITY): Payer: Self-pay | Admitting: Anesthesiology

## 2011-05-14 ENCOUNTER — Encounter (HOSPITAL_COMMUNITY): Payer: Self-pay | Admitting: *Deleted

## 2011-05-14 DIAGNOSIS — Z79899 Other long term (current) drug therapy: Secondary | ICD-10-CM | POA: Insufficient documentation

## 2011-05-14 DIAGNOSIS — D649 Anemia, unspecified: Secondary | ICD-10-CM | POA: Insufficient documentation

## 2011-05-14 DIAGNOSIS — Z992 Dependence on renal dialysis: Secondary | ICD-10-CM | POA: Insufficient documentation

## 2011-05-14 DIAGNOSIS — I120 Hypertensive chronic kidney disease with stage 5 chronic kidney disease or end stage renal disease: Secondary | ICD-10-CM | POA: Insufficient documentation

## 2011-05-14 DIAGNOSIS — Y832 Surgical operation with anastomosis, bypass or graft as the cause of abnormal reaction of the patient, or of later complication, without mention of misadventure at the time of the procedure: Secondary | ICD-10-CM | POA: Insufficient documentation

## 2011-05-14 DIAGNOSIS — E119 Type 2 diabetes mellitus without complications: Secondary | ICD-10-CM | POA: Insufficient documentation

## 2011-05-14 DIAGNOSIS — N186 End stage renal disease: Secondary | ICD-10-CM | POA: Insufficient documentation

## 2011-05-14 DIAGNOSIS — I69959 Hemiplegia and hemiparesis following unspecified cerebrovascular disease affecting unspecified side: Secondary | ICD-10-CM | POA: Insufficient documentation

## 2011-05-14 DIAGNOSIS — T82898A Other specified complication of vascular prosthetic devices, implants and grafts, initial encounter: Secondary | ICD-10-CM

## 2011-05-14 DIAGNOSIS — Z7901 Long term (current) use of anticoagulants: Secondary | ICD-10-CM | POA: Insufficient documentation

## 2011-05-14 DIAGNOSIS — Z794 Long term (current) use of insulin: Secondary | ICD-10-CM | POA: Insufficient documentation

## 2011-05-14 DIAGNOSIS — I2510 Atherosclerotic heart disease of native coronary artery without angina pectoris: Secondary | ICD-10-CM | POA: Insufficient documentation

## 2011-05-14 DIAGNOSIS — I4891 Unspecified atrial fibrillation: Secondary | ICD-10-CM | POA: Insufficient documentation

## 2011-05-14 LAB — SURGICAL PCR SCREEN
MRSA, PCR: NEGATIVE
Staphylococcus aureus: NEGATIVE

## 2011-05-14 SURGERY — LIGATION ARTERIOVENOUS GORTEX GRAFT
Anesthesia: General | Site: Thigh | Laterality: Right | Wound class: Clean

## 2011-05-14 MED ORDER — SODIUM CHLORIDE 0.9 % IV SOLN
INTRAVENOUS | Status: DC | PRN
  Administered 2011-05-14: 07:00:00 via INTRAVENOUS

## 2011-05-14 MED ORDER — SODIUM CHLORIDE 0.9 % IV SOLN
INTRAVENOUS | Status: DC | PRN
  Administered 2011-05-14: 500 mL

## 2011-05-14 MED ORDER — THROMBIN 20000 UNITS EX KIT
PACK | CUTANEOUS | Status: DC | PRN
  Administered 2011-05-14: 09:00:00 via TOPICAL

## 2011-05-14 MED ORDER — BIVALIRUDIN BOLUS VIA INFUSION
0.1000 mg/kg | Freq: Once | INTRAVENOUS | Status: DC
Start: 2011-05-14 — End: 2011-05-14
  Filled 2011-05-14: qty 7

## 2011-05-14 MED ORDER — MUPIROCIN 2 % EX OINT
TOPICAL_OINTMENT | Freq: Two times a day (BID) | CUTANEOUS | Status: DC
  Filled 2011-05-14: qty 22

## 2011-05-14 MED ORDER — SODIUM CHLORIDE 0.9 % IV SOLN: INTRAVENOUS | Status: DC

## 2011-05-14 MED ORDER — EPHEDRINE SULFATE 50 MG/ML IJ SOLN
INTRAMUSCULAR | Status: DC | PRN
  Administered 2011-05-14 (×3): 5 mg via INTRAVENOUS
  Administered 2011-05-14: 10 mg via INTRAVENOUS

## 2011-05-14 MED ORDER — HYDROMORPHONE HCL PF 1 MG/ML IJ SOLN: 0.2500 mg | INTRAMUSCULAR | Status: DC | PRN

## 2011-05-14 MED ORDER — OXYCODONE-ACETAMINOPHEN 5-325 MG PO TABS: 1.0000 | ORAL_TABLET | ORAL | Status: DC | PRN

## 2011-05-14 MED ORDER — PROPOFOL 10 MG/ML IV BOLUS
INTRAVENOUS | Status: DC | PRN
  Administered 2011-05-14: 40 mg via INTRAVENOUS
  Administered 2011-05-14: 200 mg via INTRAVENOUS

## 2011-05-14 MED ORDER — ONDANSETRON HCL 4 MG/2ML IJ SOLN: 4.0000 mg | Freq: Once | INTRAMUSCULAR | Status: DC | PRN

## 2011-05-14 MED ORDER — PROPOFOL 10 MG/ML IV EMUL: INTRAVENOUS | Status: DC | PRN

## 2011-05-14 MED ORDER — LIDOCAINE HCL (CARDIAC) 20 MG/ML IV SOLN
INTRAVENOUS | Status: DC | PRN
  Administered 2011-05-14: 100 mg via INTRAVENOUS

## 2011-05-14 MED ORDER — MUPIROCIN 2 % EX OINT
TOPICAL_OINTMENT | CUTANEOUS | Status: AC
  Filled 2011-05-14: qty 22

## 2011-05-14 MED ORDER — ONDANSETRON HCL 4 MG/2ML IJ SOLN
INTRAMUSCULAR | Status: DC | PRN
  Administered 2011-05-14: 4 mg via INTRAVENOUS

## 2011-05-14 MED ORDER — 0.9 % SODIUM CHLORIDE (POUR BTL) OPTIME
TOPICAL | Status: DC | PRN
  Administered 2011-05-14: 1000 mL

## 2011-05-14 MED ORDER — FENTANYL CITRATE 0.05 MG/ML IJ SOLN
INTRAMUSCULAR | Status: DC | PRN
  Administered 2011-05-14 (×3): 25 ug via INTRAVENOUS

## 2011-05-14 SURGICAL SUPPLY — 42 items
CANISTER SUCTION 2500CC (MISCELLANEOUS) ×2 IMPLANT
CLIP TI MEDIUM 6 (CLIP) ×2 IMPLANT
CLIP TI WIDE RED SMALL 6 (CLIP) ×2 IMPLANT
CLOTH BEACON ORANGE TIMEOUT ST (SAFETY) ×2 IMPLANT
COVER SURGICAL LIGHT HANDLE (MISCELLANEOUS) ×4 IMPLANT
DECANTER SPIKE VIAL GLASS SM (MISCELLANEOUS) ×2 IMPLANT
DERMABOND ADVANCED (GAUZE/BANDAGES/DRESSINGS) ×1
DERMABOND ADVANCED .7 DNX12 (GAUZE/BANDAGES/DRESSINGS) ×1 IMPLANT
ELECT REM PT RETURN 9FT ADLT (ELECTROSURGICAL) ×2
ELECTRODE REM PT RTRN 9FT ADLT (ELECTROSURGICAL) ×1 IMPLANT
GAUZE SPONGE 4X4 12PLY STRL LF (GAUZE/BANDAGES/DRESSINGS) ×2 IMPLANT
GLOVE BIO SURGEON STRL SZ7 (GLOVE) ×2 IMPLANT
GLOVE BIOGEL PI IND STRL 6.5 (GLOVE) ×2 IMPLANT
GLOVE BIOGEL PI IND STRL 7.5 (GLOVE) ×2 IMPLANT
GLOVE BIOGEL PI INDICATOR 6.5 (GLOVE) ×2
GLOVE BIOGEL PI INDICATOR 7.5 (GLOVE) ×2
GLOVE ECLIPSE 6.5 STRL STRAW (GLOVE) ×4 IMPLANT
GLOVE SS BIOGEL STRL SZ 7 (GLOVE) ×1 IMPLANT
GLOVE SUPERSENSE BIOGEL SZ 7 (GLOVE) ×1
GOWN STRL NON-REIN LRG LVL3 (GOWN DISPOSABLE) ×8 IMPLANT
INSERT FOGARTY SM (MISCELLANEOUS) IMPLANT
KIT BASIN OR (CUSTOM PROCEDURE TRAY) ×2 IMPLANT
KIT ROOM TURNOVER OR (KITS) ×2 IMPLANT
NS IRRIG 1000ML POUR BTL (IV SOLUTION) ×2 IMPLANT
PACK CV ACCESS (CUSTOM PROCEDURE TRAY) ×2 IMPLANT
PAD ARMBOARD 7.5X6 YLW CONV (MISCELLANEOUS) ×4 IMPLANT
SPONGE SURGIFOAM ABS GEL 100 (HEMOSTASIS) ×2 IMPLANT
SUT ETHILON 3 0 PS 1 (SUTURE) ×2 IMPLANT
SUT MNCRL AB 4-0 PS2 18 (SUTURE) ×2 IMPLANT
SUT PROLENE 5 0 C 1 24 (SUTURE) ×4 IMPLANT
SUT PROLENE 6 0 BV (SUTURE) ×4 IMPLANT
SUT PROLENE 7 0 BV 1 (SUTURE) IMPLANT
SUT SILK 2 0 FS (SUTURE) ×2 IMPLANT
SUT VIC AB 2-0 CT1 27 (SUTURE) ×1
SUT VIC AB 2-0 CT1 TAPERPNT 27 (SUTURE) ×1 IMPLANT
SUT VIC AB 3-0 SH 27 (SUTURE) ×2
SUT VIC AB 3-0 SH 27X BRD (SUTURE) ×2 IMPLANT
TAPE CLOTH SURG 4X10 WHT LF (GAUZE/BANDAGES/DRESSINGS) ×2 IMPLANT
TOWEL OR 17X24 6PK STRL BLUE (TOWEL DISPOSABLE) ×2 IMPLANT
TOWEL OR 17X26 10 PK STRL BLUE (TOWEL DISPOSABLE) ×2 IMPLANT
UNDERPAD 30X30 INCONTINENT (UNDERPADS AND DIAPERS) ×2 IMPLANT
WATER STERILE IRR 1000ML POUR (IV SOLUTION) ×2 IMPLANT

## 2011-05-14 NOTE — Anesthesia Postprocedure Evaluation (Signed)
  Anesthesia Post-op Note  Patient: Ebony [name]  Procedure(s) Performed: Procedure(s) (LRB): LIGATION ARTERIOVENOUS GORTEX GRAFT (Right)  Patient Location: PACU  Anesthesia Type: General  Level of Consciousness: awake, sedated and patient cooperative  Airway and Oxygen Therapy: Patient Spontanous Breathing and Patient connected to nasal cannula oxygen  Post-op Pain: none  Post-op Assessment: Post-op Vital signs reviewed, Patient's Cardiovascular Status Stable, Respiratory Function Stable, Patent Airway, No signs of Nausea or vomiting and Pain level controlled  Post-op Vital Signs: stable  Complications: No apparent anesthesia complications

## 2011-05-14 NOTE — Progress Notes (Signed)
Pt did not have am dose of metoprolol held dose in short stay d/t heart rate

## 2011-05-14 NOTE — H&P (View-Only) (Signed)
[facility] [place] Consultation   Requesting: Wentz, ER [place] Reason for consult: right leg swelling  History of Present Illness:  Patient is a 78 y.o. year old female who presents for evaluation of right leg swelling.  She has a known right thigh graft with recent angioplasty of the venous outflow in January.  She has some dementia but states that the swelling has been present for a few days.  The graft has been working.  She has some tenderness over the right ankle.  She denies swelling in this leg previously.  Other medical problems include a fib for which she is on coumadin, CAD, Anemia, Diabetes.  Chronic hemodialysis T Th Sat [address]  She lives at [facility].    Past Medical History  Diagnosis Date  . Atrial fibrillation   . CAD (coronary artery disease)   . Hypertension   . Anemia   . MRSA bacteremia     hx  . Cellulitis   . GERD (gastroesophageal reflux disease)   . Acetabulum fracture   . Contracture of elbow     L arm  . Dementia   . Diabetes mellitus     on Insulin  . Decubitus ulcer, buttock 02/07/2011    Pt presents with dressing to R buttocks/coccyx.  . Pneumonia   . Blood transfusion   . Stroke ~ 1990's    Left side paralysis.  Left  foot drop., L arm contracted.  . Chronic kidney disease 03/13/11    dialysis at [address]; TTS,last tx 03/12/11 hemo x 20years    Past Surgical History  Procedure Date  . Arteriovenous graft placement   . Appendectomy   . Cholecystectomy   . Tubal ligation     bilateral  . Abdominal hysterectomy   . Av fistula placement 02/07/2011    Procedure: INSERTION OF ARTERIOVENOUS (AV) GORE-TEX GRAFT THIGH;  Surgeon: James D Lawson, MD;  Location: [facility] OR;  Service: Vascular;  Laterality: Right;  insertion AVGG right thigh  . Av fistula placement 02/12/2011    Procedure: INSERTION OF ARTERIOVENOUS (AV) GORE-TEX GRAFT THIGH;  Surgeon: Brian Liang-Yu Chen, MD;  Location: [facility] OR;  Service: Vascular;   Laterality: Right;  . Cataract extraction w/ intraocular lens  implant, bilateral      Social History History  Substance Use Topics  . Smoking status: Former Smoker -- 0.2 packs/day for 30 years    Types: Cigarettes    Quit date: 03/12/2000  . Smokeless tobacco: Never Used  . Alcohol Use: No     "last alcohol ~ 1990"    Family History Family History  Problem Relation Age of Onset  . Diabetes Mother   . Hypertension Father   . Anesthesia problems Neg Hx   . Hypotension Neg Hx   . Malignant hyperthermia Neg Hx   . Pseudochol deficiency Neg Hx     Allergies  Allergies  Allergen Reactions  . Aspirin     Unknown    . Heparin     Unknown    . Minoxidil     Unknown    . Penicillins     Unknown       No current facility-administered medications for this encounter.   Current Outpatient Prescriptions  Medication Sig Dispense Refill  . amLODipine (NORVASC) 5 MG tablet Take 5 mg by mouth at bedtime.      . bisacodyl (DULCOLAX) 5 MG EC tablet Take 10 mg by mouth daily as needed. For constipation      .   calcium acetate (PHOSLO) 667 MG capsule Take 667 mg by mouth 3 (three) times daily with meals.       . calcium carbonate, dosed in mg elemental calcium, 1250 MG/5ML Take 500 mg of elemental calcium by mouth every 6 (six) hours as needed. For acid reflux      . darbepoetin (ARANESP) 200 MCG/0.4ML SOLN Inject 200 mcg into the skin every 7 (seven) days. Takes on Thursdays.      . docusate sodium (COLACE) 100 MG capsule Take 100 mg by mouth 2 (two) times daily.       . DULoxetine (CYMBALTA) 20 MG capsule Take 20 mg by mouth daily.      . feeding supplement (PRO-STAT SUGAR FREE 64) LIQD Take 30 mLs by mouth 3 (three) times daily with meals.      . insulin glargine (LANTUS) 100 UNIT/ML injection Inject 5 Units into the skin at bedtime.       . lidocaine-prilocaine (EMLA) cream Apply 1 application topically 3 (three) times a week. Tues, thurs, and sat 30 mins before dialysis        . metoprolol tartrate (LOPRESSOR) 25 MG tablet Take 25 mg by mouth 3 (three) times a week. After dialysis on tues, thur, and sat.      . multivitamin (RENA-VIT) TABS tablet Take 1 tablet by mouth at bedtime.  30 tablet  5  . omeprazole (PRILOSEC) 20 MG capsule Take 20 mg by mouth daily.       . oxyCODONE-acetaminophen (PERCOCET) 5-325 MG per tablet Take 1-2 tablets by mouth every 4 (four) hours as needed. For pain. 1 tablet for mild/moderate pain; 2 tablets for moderate/severe pain.       . paricalcitol (ZEMPLAR) 5 MCG/ML injection Inject 0.2 mLs (1 mcg total) into the vein 3 (three) times a week.  1 mL    . risperiDONE (RISPERDAL) 0.25 MG tablet Take 0.25 mg by mouth at bedtime.       . simvastatin (ZOCOR) 20 MG tablet Take 20 mg by mouth at bedtime.       . Nutritional Supplements (FEEDING SUPPLEMENT, NEPRO CARB STEADY,) LIQD Take 237 mLs by mouth 3 (three) times daily with meals.        ROS:   General:  No weight loss, Fever, chills  HEENT: No recent headaches, no nasal bleeding, no visual changes, no sore throat  Neurologic: No dizziness, blackouts, seizures. No recent symptoms of stroke or mini- stroke. No recent episodes of slurred speech, or temporary blindness.  Cardiac: No recent episodes of chest pain/pressure, no shortness of breath at rest.  No shortness of breath with exertion.    Vascular: No history of rest pain in feet.  No history of claudication.  No history of non-healing ulcer, No history of DVT   Pulmonary: No home oxygen, no productive cough, no hemoptysis,  No asthma or wheezing  Musculoskeletal:  [ ] Arthritis, [ ] Low back pain,  [ ] Joint pain  Hematologic:No history of hypercoagulable state.  No history of easy bleeding.    Gastrointestinal: No hematochezia or melena,  No gastroesophageal reflux, no trouble swallowing  Urinary: [x ] chronic Kidney disease, [ ] on HD - [ ] MWF or [x ] TTHS, [ ] Burning with urination, [ ] Frequent urination, [ ] Difficulty  urinating;   Skin: No rashes  Psychological: No history of anxiety,  No history of depression   Physical Examination  Filed Vitals:   04/20/11 1248 04/20/11 1310 04/20/11 1531   04/20/11 1817  BP: 88/26 98/49 91/42 102/51  Pulse: 63 59 57 70  Temp: 98.8 F (37.1 C) 98.4 F (36.9 C)    TempSrc: Oral Oral    Resp: 20 19 20 20  SpO2: 97% 100% 100% 100%    There is no height or weight on file to calculate BMI.  General:  Alert and oriented, no acute distress, mild confusion on her medical history HEENT: Normal Neck: No bruit or JVD Pulmonary: Clear to auscultation bilaterally Cardiac: Regular Rate and Rhythm without murmur Abdomen: Soft, non-tender, non-distended, no mass Skin: No rash, 5 cm area of erythema right lateral malleolus tender to palpation non fluctuant Extremities:  Right thigh graft with audible bruit, diffuse right leg swelling approx 30% larger than left Musculoskeletal: No obvious deformity Neurologic: Upper and lower extremity motor 5/5 and symmetric  DATA:  Venous duplex shows non DVT, patent right thigh graft  ASSESSMENT:  Venous hypertension secondary to outflow stenosis of right thigh graft  PLAN: Will stop coumadin today and schedule for right thigh shuntogram next Wednesday Feb 13  Jenaya Saar, MD [facility] [place] Office: [phone] Pager: [phone]  

## 2011-05-14 NOTE — Progress Notes (Signed)
Report given to jan rn as caregiver 

## 2011-05-14 NOTE — Anesthesia Procedure Notes (Signed)
Procedure Name: LMA Insertion Date/Time: 05/14/2011 7:53 AM Performed by: Einar Crow Pre-anesthesia Checklist: Patient identified, Emergency Drugs available, Suction available, Patient being monitored and Timeout performed Patient Re-evaluated:Patient Re-evaluated prior to inductionOxygen Delivery Method: Circle system utilized Preoxygenation: Pre-oxygenation with 100% oxygen Intubation Type: IV induction LMA: LMA inserted LMA Size: 4.0 Number of attempts: 2 Placement Confirmation: positive ETCO2 and breath sounds checked- equal and bilateral Tube secured with: Tape

## 2011-05-14 NOTE — Interval H&P Note (Signed)
--    [facility] [place]  History and Physical Update  The patient was interviewed and re-examined.  The patient's History and Physical has been reviewed and is unchanged except for: interval right thigh shuntogram.  Per Dr. Estanislado Spire shuntogram, there appeared to be occlusion of the distal common femoral vein with evidence of patent outflow channel further proximal.  Dr. Myra Gianotti felt there was some utility to attempting to jump to this more proximal segment.  The plan is possible revision of right thigh arteriovenous graft, possible ligation of graft, possible tunnel dialysis catheter placement.  Leonides Sake, MD [facility] [place] Office: [phone] Pager: [phone]  05/14/2011, 7:29 AM

## 2011-05-14 NOTE — Op Note (Signed)
OPERATIVE NOTE   PROCEDURE: 1. Right thigh arteriovenous graft ligation 2. Excision of right groin seroma  PRE-OPERATIVE DIAGNOSIS: Right femoral vein occlusion, right leg venous hypertension  POST-OPERATIVE DIAGNOSIS: same as above   SURGEON: Leonides Sake, MD  ASSISTANT(S): Lianne Cure, PAC  ANESTHESIA: general  ESTIMATED BLOOD LOSS: 50 cc  FINDING(S): 1. Goretex related seroma surrounding arterial and venous anastomosis 2. Severe scarring in right groin  SPECIMEN(S):  Contents of seroma  INDICATIONS:   Ebony [name] is a 78 y.o. female who presents with right leg swelling after placement of Goretex arteriovenous graft (02/12/11).  She had previously undergone angioplasty of the venous outflow due to venous outflow stenosis thought caused by prior dialysis catheter placement.  She was admitted to the hospital this month and found to have significant right leg swelling.  A shuntogram demonstrated occlusion of the common femoral vein adjacent to the venous anastomosis.  Dr. Myra Gianotti felt a jump graft might be possible so the patient was scheduled for a right groin exploration, possible revision of thigh arteriovenous graft, or possible ligation.  Risk, benefits, and alternatives to access surgery were discussed.  The patient is aware the risks include but are not limited to: bleeding, infection, steal syndrome, nerve damage, ischemic monomelic neuropathy, failure to mature, need for additional procedures, death and stroke.  The patient agrees to proceed forward with the procedure.  DESCRIPTION: After obtaining full informed written consent, the patient was brought back to the operating room and placed supine upon the operating table.  The patient received IV antibiotics prior to induction.  After obtaining adequate anesthesia, the patient was prepped and draped in the standard fashion for: right groin exploration.  I made an incision through the previous incision and dissected down  through the significant scarred tissue.  In this process, I identified a seroma with gelatinous material inside.  I excised this seroma and evacuation all of the gelatinous material.  This appeared to be consistent with a Goretex seroma.  There was no evidence of infection.  The contents of the seroma was sent for pathology.  I then attempted to dissect out the common femoral vein, but the tissue was so scarred, I was not able to safely identify the vein.  At this point, I elected to ligate the thigh arteriovenous graft to terminate the venous hypertension in this leg.  I dissected out the graft and then clamped the proximal segment of this graft and transected the graft.  I oversewed both ends of the graft with a double layer of 5-0 prolene.  I placed thrombin and gelfoam and waited a few minutes.  This was removed and the groin was washed out.  There was not further bleeding.  I reapproximated the deep tissue with a double layer of 2-0 Vicryl.  The subcutaneous tissue was reapproximated with a running 3-0 Vicryl.  The skin was reapproximated with a running stitch of 3-0 Nylon.  The skin was cleaned and dried.  A sterile bandage was applied.  COMPLICATIONS: none  CONDITION: stable  Leonides Sake, MD [facility] [place] Office: [phone] Pager: [phone]  05/14/2011, 9:38 AM -

## 2011-05-14 NOTE — Anesthesia Preprocedure Evaluation (Addendum)
Anesthesia Evaluation  Patient identified by MRN, date of birth, ID band Patient awake    Reviewed: Allergy & Precautions, H&P , NPO status , Patient's Chart, lab work & pertinent test results, reviewed documented beta blocker date and time   Airway Mallampati: II TM Distance: >3 FB Neck ROM: full    Dental  (+) Poor Dentition   Pulmonary pneumonia ,          Cardiovascular hypertension, + CAD + dysrhythmias Atrial Fibrillation Rhythm:irregular Rate:Normal     Neuro/Psych PSYCHIATRIC DISORDERS CVA    GI/Hepatic GERD-  ,  Endo/Other  Diabetes mellitus-  Renal/GU ESRF, Dialysis and CRF     Musculoskeletal   Abdominal   Peds  Hematology   Anesthesia Other Findings   Reproductive/Obstetrics                          Anesthesia Physical Anesthesia Plan  ASA: III  Anesthesia Plan: General   Post-op Pain Management:    Induction: Intravenous  Airway Management Planned: LMA, Oral ETT and Mask  Additional Equipment:   Intra-op Plan:   Post-operative Plan:   Informed Consent: I have reviewed the patients History and Physical, chart, labs and discussed the procedure including the risks, benefits and alternatives for the proposed anesthesia with the patient or authorized representative who has indicated his/her understanding and acceptance.     Plan Discussed with: Anesthesiologist, CRNA and Surgeon  Anesthesia Plan Comments:         Anesthesia Quick Evaluation

## 2011-05-14 NOTE — Preoperative (Signed)
Beta Blockers   Reason not to administer Beta Blockers:Not Applicable 

## 2011-05-14 NOTE — Transfer of Care (Signed)
Immediate Anesthesia Transfer of Care Note  Patient: Ebony [name]  Procedure(s) Performed: Procedure(s) (LRB): LIGATION ARTERIOVENOUS GORTEX GRAFT (Right)  Patient Location: PACU  Anesthesia Type: General  Level of Consciousness: awake and patient cooperative  Airway & Oxygen Therapy: Patient Spontanous Breathing and Patient connected to nasal cannula oxygen  Post-op Assessment: Report given to PACU RN, Post -op Vital signs reviewed and stable and Patient moving all extremities  Post vital signs: Reviewed and stable  Complications: No apparent anesthesia complications

## 2011-05-15 LAB — POCT I-STAT 4, (NA,K, GLUC, HGB,HCT): Hemoglobin: 13.3 g/dL (ref 12.0–15.0)

## 2011-05-15 MED FILL — Mupirocin Oint 2%: CUTANEOUS | Qty: 22 | Status: AC

## 2011-05-17 ENCOUNTER — Ambulatory Visit: Payer: Medicare Other | Admitting: Vascular Surgery

## 2011-05-21 ENCOUNTER — Encounter: Payer: Self-pay | Admitting: Vascular Surgery

## 2011-05-22 ENCOUNTER — Encounter (HOSPITAL_COMMUNITY): Payer: Self-pay | Admitting: *Deleted

## 2011-05-22 ENCOUNTER — Encounter: Payer: Self-pay | Admitting: Vascular Surgery

## 2011-05-22 ENCOUNTER — Ambulatory Visit (INDEPENDENT_AMBULATORY_CARE_PROVIDER_SITE_OTHER): Payer: Medicare Other | Admitting: Vascular Surgery

## 2011-05-22 ENCOUNTER — Other Ambulatory Visit: Payer: Self-pay

## 2011-05-22 VITALS — BP 136/80 | HR 62 | Resp 16 | Ht 66.5 in | Wt 154.0 lb

## 2011-05-22 DIAGNOSIS — N186 End stage renal disease: Secondary | ICD-10-CM

## 2011-05-22 MED ORDER — VANCOMYCIN HCL IN DEXTROSE 1-5 GM/200ML-% IV SOLN
1000.0000 mg | Freq: Once | INTRAVENOUS | Status: DC
  Filled 2011-05-22: qty 200

## 2011-05-22 NOTE — Progress Notes (Signed)
Subjective:     Patient ID: Ebony [name], female   DOB: [birth year], 78 y.o.   MRN: [id number]  HPI this 78 year old female with end-stage renal disease has dialysis Tuesday Thursday Saturday. Last Monday she had ligation of an AV graft in the right lower extremity for venous hypertension to 2 outflow vein occlusion. She also had a seroma associated with the Gore-Tex. He returns today with abundant drainage from the right inguinal wound. He is to go to dialysis today.  Past Medical History  Diagnosis Date  . Atrial fibrillation   . CAD (coronary artery disease)   . Hypertension   . Anemia   . MRSA bacteremia     hx  . Cellulitis   . GERD (gastroesophageal reflux disease)   . Acetabulum fracture   . Contracture of elbow     L arm  . Dementia   . Diabetes mellitus     on Insulin  . Decubitus ulcer, buttock 02/07/2011    Pt presents with dressing to R buttocks/coccyx.  . Pneumonia   . Blood transfusion   . Stroke ~ 1990's    Left side paralysis.  Left  foot drop., L arm contracted.  . Chronic kidney disease 03/13/11    dialysis at [facility]; TTS,last tx 03/12/11 hemo x 20years    History  Substance Use Topics  . Smoking status: Former Smoker -- 0.2 packs/day for 30 years    Types: Cigarettes    Quit date: 03/12/2000  . Smokeless tobacco: Never Used  . Alcohol Use: No     "last alcohol ~ 1990"    Family History  Problem Relation Age of Onset  . Diabetes Mother   . Hypertension Father   . Anesthesia problems Neg Hx   . Hypotension Neg Hx   . Malignant hyperthermia Neg Hx   . Pseudochol deficiency Neg Hx     Allergies  Allergen Reactions  . Aspirin     Unknown    . Heparin     Unknown    . Minoxidil     Unknown    . Penicillins     Unknown      Current outpatient prescriptions:amLODipine (NORVASC) 5 MG tablet, Take 5 mg by mouth at bedtime., Disp: , Rfl: ;  bisacodyl (DULCOLAX) 5 MG EC tablet, Take 10 mg by mouth daily as needed. For constipation,  Disp: , Rfl: ;  calcium acetate (PHOSLO) 667 MG capsule, Take 667 mg by mouth 3 (three) times daily with meals. , Disp: , Rfl:  calcium carbonate, dosed in mg elemental calcium, 1250 MG/5ML, Take 500 mg of elemental calcium by mouth every 6 (six) hours as needed. For acid reflux, Disp: , Rfl: ;  darbepoetin (ARANESP) 200 MCG/0.4ML SOLN, Inject 200 mcg into the skin every 7 (seven) days. Takes on Thursdays., Disp: , Rfl: ;  docusate sodium (COLACE) 100 MG capsule, Take 100 mg by mouth 2 (two) times daily. , Disp: , Rfl:  DULoxetine (CYMBALTA) 20 MG capsule, Take 20 mg by mouth daily., Disp: , Rfl: ;  feeding supplement (PRO-STAT SUGAR FREE 64) LIQD, Take 30 mLs by mouth 2 (two) times daily with a meal. , Disp: , Rfl: ;  insulin glargine (LANTUS) 100 UNIT/ML injection, Inject 5 Units into the skin at bedtime. , Disp: , Rfl:  lidocaine-prilocaine (EMLA) cream, Apply 1 application topically 3 (three) times a week. Tues, thurs, and sat 30 mins before dialysis, Disp: , Rfl: ;  metoprolol tartrate (LOPRESSOR) 25 MG tablet,  Take 25 mg by mouth 3 (three) times a week. After dialysis on tues, thur, sat. And also on Fridays, Disp: , Rfl:  metoprolol tartrate (LOPRESSOR) 25 MG tablet, Take 25 mg by mouth See admin instructions. 25mg  two times daily on Sun, Mon., Wed., Fri., Disp: , Rfl: ;  multivitamin (RENA-VIT) TABS tablet, Take 1 tablet by mouth at bedtime., Disp: 30 tablet, Rfl: 5;  Nutritional Supplements (FEEDING SUPPLEMENT, NEPRO CARB STEADY,) LIQD, Take 237 mLs by mouth 3 (three) times daily with meals., Disp: , Rfl:  omeprazole (PRILOSEC) 20 MG capsule, Take 20 mg by mouth daily. , Disp: , Rfl: ;  oxyCODONE-acetaminophen (PERCOCET) 5-325 MG per tablet, Take 1-2 tablets by mouth every 4 (four) hours as needed. For pain. 1 tablet for mild/moderate pain; 2 tablets for moderate/severe pain., Disp: 30 tablet, Rfl: 0;  paricalcitol (ZEMPLAR) 5 MCG/ML injection, Inject 0.2 mLs (1 mcg total) into the vein 3 (three) times  a week., Disp: 1 mL, Rfl:  risperiDONE (RISPERDAL) 0.25 MG tablet, Take 0.25 mg by mouth at bedtime. , Disp: , Rfl: ;  simvastatin (ZOCOR) 20 MG tablet, Take 20 mg by mouth at bedtime. , Disp: , Rfl: ;  warfarin (COUMADIN) 5 MG tablet, Take 5 mg by mouth daily., Disp: , Rfl:   BP 136/80  Pulse 62  Resp 16  Ht 5' 6.5" (1.689 m)  Wt 154 lb (69.854 kg)  BMI 24.48 kg/m2  Body mass index is 24.48 kg/(m^2).            Review of Systems     Objective:   Physical Exam blood pressure 136/80 heart rate 60 respirations 16 General chronically ill-appearing female in a wheelchair Lungs no rhonchi or wheezing Cardiovascular irregular rhythm no murmurs Abdomen obese no masses Right inguinal wound has sutures in place from procedure performed by Dr. Johny Drilling last week. There is some thin brownish drainage coming from this wound. There is no fluctuance in the AV graft in the right thigh but there is some edema-one plus    Assessment:     Suspect infection where AV graft was ligated in right groin and previous seroma drained by Dr. Imogene Burn last week    Plan:    patient had dialysis today as scheduled Admitted in a.m. for Dr.Chen to explore right inguinal wound Check PT/INR in a.m. Patient is on chronic Coumadin but do not know if she has been taking it since returned to [place] living postop

## 2011-05-23 ENCOUNTER — Inpatient Hospital Stay (HOSPITAL_COMMUNITY): Admission: AD | Admit: 2011-05-23 | Payer: Medicare Other | Source: Ambulatory Visit | Admitting: Vascular Surgery

## 2011-05-23 ENCOUNTER — Encounter (HOSPITAL_COMMUNITY): Payer: Self-pay | Admitting: *Deleted

## 2011-05-23 ENCOUNTER — Inpatient Hospital Stay (HOSPITAL_COMMUNITY)
Admission: RE | Admit: 2011-05-23 | Discharge: 2011-06-01 | DRG: 264 | Disposition: A | Discharge status: Skilled Nursing Facility | Payer: Medicare Other | Source: Ambulatory Visit | Attending: Vascular Surgery | Admitting: Vascular Surgery

## 2011-05-23 DIAGNOSIS — D649 Anemia, unspecified: Secondary | ICD-10-CM | POA: Diagnosis present

## 2011-05-23 DIAGNOSIS — L8990 Pressure ulcer of unspecified site, unspecified stage: Secondary | ICD-10-CM | POA: Diagnosis present

## 2011-05-23 DIAGNOSIS — I120 Hypertensive chronic kidney disease with stage 5 chronic kidney disease or end stage renal disease: Secondary | ICD-10-CM | POA: Diagnosis present

## 2011-05-23 DIAGNOSIS — E119 Type 2 diabetes mellitus without complications: Secondary | ICD-10-CM | POA: Diagnosis present

## 2011-05-23 DIAGNOSIS — I69959 Hemiplegia and hemiparesis following unspecified cerebrovascular disease affecting unspecified side: Secondary | ICD-10-CM

## 2011-05-23 DIAGNOSIS — Y841 Kidney dialysis as the cause of abnormal reaction of the patient, or of later complication, without mention of misadventure at the time of the procedure: Secondary | ICD-10-CM | POA: Diagnosis present

## 2011-05-23 DIAGNOSIS — I4891 Unspecified atrial fibrillation: Secondary | ICD-10-CM | POA: Diagnosis present

## 2011-05-23 DIAGNOSIS — T827XXA Infection and inflammatory reaction due to other cardiac and vascular devices, implants and grafts, initial encounter: Principal | ICD-10-CM | POA: Diagnosis present

## 2011-05-23 DIAGNOSIS — Z992 Dependence on renal dialysis: Secondary | ICD-10-CM

## 2011-05-23 DIAGNOSIS — D696 Thrombocytopenia, unspecified: Secondary | ICD-10-CM | POA: Diagnosis present

## 2011-05-23 DIAGNOSIS — N186 End stage renal disease: Secondary | ICD-10-CM | POA: Diagnosis present

## 2011-05-23 DIAGNOSIS — Z7901 Long term (current) use of anticoagulants: Secondary | ICD-10-CM

## 2011-05-23 DIAGNOSIS — L89309 Pressure ulcer of unspecified buttock, unspecified stage: Secondary | ICD-10-CM | POA: Diagnosis present

## 2011-05-23 DIAGNOSIS — F0390 Unspecified dementia without behavioral disturbance: Secondary | ICD-10-CM | POA: Diagnosis present

## 2011-05-23 LAB — GLUCOSE, CAPILLARY
Glucose-Capillary: 96 mg/dL (ref 70–99)
Glucose-Capillary: 87 mg/dL (ref 70–99)

## 2011-05-23 LAB — APTT: aPTT: 69 seconds — ABNORMAL HIGH (ref 24–37)

## 2011-05-23 LAB — POCT I-STAT 4, (NA,K, GLUC, HGB,HCT)
Glucose, Bld: 104 mg/dL — ABNORMAL HIGH (ref 70–99)
HCT: 38 % (ref 36.0–46.0)
Hemoglobin: 12.9 g/dL (ref 12.0–15.0)

## 2011-05-23 MED ORDER — LABETALOL HCL 5 MG/ML IV SOLN
10.0000 mg | INTRAVENOUS | Status: DC | PRN
  Filled 2011-05-23: qty 4

## 2011-05-23 MED ORDER — CALCIUM CARBONATE 1250 MG/5ML PO SUSP
500.0000 mg | Freq: Four times a day (QID) | ORAL | Status: DC | PRN
  Filled 2011-05-23: qty 5

## 2011-05-23 MED ORDER — HEPARIN SODIUM (PORCINE) 1000 UNIT/ML DIALYSIS
1000.0000 [IU] | INTRAMUSCULAR | Status: DC | PRN
  Administered 2011-05-24: 1000 [IU] via INTRAVENOUS_CENTRAL

## 2011-05-23 MED ORDER — LIDOCAINE HCL (PF) 1 % IJ SOLN: 5.0000 mL | INTRAMUSCULAR | Status: DC | PRN

## 2011-05-23 MED ORDER — MORPHINE SULFATE 2 MG/ML IJ SOLN
2.0000 mg | INTRAMUSCULAR | Status: DC | PRN
  Administered 2011-05-27 – 2011-05-28 (×3): 2 mg via INTRAVENOUS
  Filled 2011-05-23 (×4): qty 1

## 2011-05-23 MED ORDER — SODIUM CHLORIDE 0.9 % IV SOLN: 100.0000 mL | INTRAVENOUS | Status: DC | PRN

## 2011-05-23 MED ORDER — GUAIFENESIN-DM 100-10 MG/5ML PO SYRP: 15.0000 mL | ORAL_SOLUTION | ORAL | Status: DC | PRN

## 2011-05-23 MED ORDER — SODIUM CHLORIDE 0.9 % IV SOLN: INTRAVENOUS | Status: DC

## 2011-05-23 MED ORDER — ACETAMINOPHEN 650 MG RE SUPP: 650.0000 mg | Freq: Four times a day (QID) | RECTAL | Status: DC | PRN

## 2011-05-23 MED ORDER — SODIUM CHLORIDE 0.9 % IJ SOLN: 3.0000 mL | Freq: Two times a day (BID) | INTRAMUSCULAR | Status: DC

## 2011-05-23 MED ORDER — ONDANSETRON HCL 4 MG/2ML IJ SOLN: 4.0000 mg | Freq: Four times a day (QID) | INTRAMUSCULAR | Status: DC | PRN

## 2011-05-23 MED ORDER — ACETAMINOPHEN 325 MG PO TABS
650.0000 mg | ORAL_TABLET | Freq: Four times a day (QID) | ORAL | Status: DC | PRN
  Administered 2011-05-25 – 2011-05-27 (×2): 650 mg via ORAL
  Filled 2011-05-23 (×3): qty 2

## 2011-05-23 MED ORDER — PHENOL 1.4 % MT LIQD
1.0000 | OROMUCOSAL | Status: DC | PRN
  Filled 2011-05-23: qty 177

## 2011-05-23 MED ORDER — CIPROFLOXACIN HCL 500 MG PO TABS
500.0000 mg | ORAL_TABLET | Freq: Every day | ORAL | Status: DC
  Administered 2011-05-23: 500 mg via ORAL
  Filled 2011-05-23 (×2): qty 1

## 2011-05-23 MED ORDER — ZOLPIDEM TARTRATE 5 MG PO TABS: 5.0000 mg | ORAL_TABLET | Freq: Every evening | ORAL | Status: DC | PRN

## 2011-05-23 MED ORDER — DOCUSATE SODIUM 283 MG RE ENEM
1.0000 | ENEMA | RECTAL | Status: DC | PRN
  Filled 2011-05-23: qty 1

## 2011-05-23 MED ORDER — CAMPHOR-MENTHOL 0.5-0.5 % EX LOTN
1.0000 "application " | TOPICAL_LOTION | Freq: Three times a day (TID) | CUTANEOUS | Status: DC | PRN
  Filled 2011-05-23: qty 222

## 2011-05-23 MED ORDER — HYDROXYZINE HCL 25 MG PO TABS
25.0000 mg | ORAL_TABLET | Freq: Three times a day (TID) | ORAL | Status: DC | PRN
  Filled 2011-05-23: qty 1

## 2011-05-23 MED ORDER — SODIUM CHLORIDE 0.9 % IJ SOLN: 3.0000 mL | INTRAMUSCULAR | Status: DC | PRN

## 2011-05-23 MED ORDER — SODIUM CHLORIDE 0.9 % IV SOLN
125.0000 mg | INTRAVENOUS | Status: AC
  Administered 2011-05-25 – 2011-05-29 (×3): 125 mg via INTRAVENOUS
  Filled 2011-05-23 (×6): qty 10

## 2011-05-23 MED ORDER — SORBITOL 70 % SOLN
30.0000 mL | Status: DC | PRN
  Filled 2011-05-23: qty 30

## 2011-05-23 MED ORDER — NEPRO/CARBSTEADY PO LIQD
237.0000 mL | Freq: Three times a day (TID) | ORAL | Status: DC
  Administered 2011-05-23 – 2011-06-01 (×14): 237 mL via ORAL
  Filled 2011-05-23 (×31): qty 237

## 2011-05-23 MED ORDER — SODIUM CHLORIDE 0.9 % IV SOLN: 250.0000 mL | INTRAVENOUS | Status: DC | PRN

## 2011-05-23 MED ORDER — VANCOMYCIN HCL 1000 MG IV SOLR
750.0000 mg | INTRAVENOUS | Status: DC
  Administered 2011-05-25 – 2011-05-27 (×2): 750 mg via INTRAVENOUS
  Filled 2011-05-23 (×4): qty 750

## 2011-05-23 MED ORDER — OXYCODONE HCL 5 MG PO TABS
5.0000 mg | ORAL_TABLET | ORAL | Status: DC | PRN
  Administered 2011-05-28 – 2011-05-31 (×5): 10 mg via ORAL
  Administered 2011-06-01: 5 mg via ORAL
  Filled 2011-05-23 (×2): qty 2
  Filled 2011-05-23: qty 1
  Filled 2011-05-23 (×2): qty 2

## 2011-05-23 MED ORDER — METOPROLOL TARTRATE 1 MG/ML IV SOLN: 2.0000 mg | INTRAVENOUS | Status: DC | PRN

## 2011-05-23 MED ORDER — PARICALCITOL 5 MCG/ML IV SOLN
2.0000 ug | INTRAVENOUS | Status: DC
  Administered 2011-05-25 – 2011-05-31 (×3): 2 ug via INTRAVENOUS
  Filled 2011-05-23 (×5): qty 0.4

## 2011-05-23 MED ORDER — CALCIUM ACETATE 667 MG PO CAPS
667.0000 mg | ORAL_CAPSULE | Freq: Three times a day (TID) | ORAL | Status: DC
  Administered 2011-05-23 – 2011-06-01 (×16): 667 mg via ORAL
  Filled 2011-05-23 (×29): qty 1

## 2011-05-23 MED ORDER — RENA-VITE PO TABS
1.0000 | ORAL_TABLET | Freq: Every day | ORAL | Status: DC
  Administered 2011-05-24 – 2011-05-31 (×9): 1 via ORAL
  Filled 2011-05-23 (×10): qty 1

## 2011-05-23 MED ORDER — HYDRALAZINE HCL 20 MG/ML IJ SOLN
10.0000 mg | INTRAMUSCULAR | Status: DC | PRN
  Filled 2011-05-23: qty 0.5

## 2011-05-23 MED ORDER — NEPRO/CARBSTEADY PO LIQD
237.0000 mL | ORAL | Status: DC | PRN
  Filled 2011-05-23: qty 237

## 2011-05-23 MED ORDER — PENTAFLUOROPROP-TETRAFLUOROETH EX AERO: 1.0000 "application " | INHALATION_SPRAY | CUTANEOUS | Status: DC | PRN

## 2011-05-23 MED ORDER — LIDOCAINE-PRILOCAINE 2.5-2.5 % EX CREA
1.0000 "application " | TOPICAL_CREAM | CUTANEOUS | Status: DC | PRN
  Filled 2011-05-23: qty 5

## 2011-05-23 MED ORDER — VITAMIN K1 10 MG/ML IJ SOLN
2.0000 mg | Freq: Once | INTRAVENOUS | Status: AC
  Administered 2011-05-24: 2 mg via INTRAVENOUS
  Filled 2011-05-23 (×2): qty 0.2

## 2011-05-23 MED ORDER — VANCOMYCIN HCL 1000 MG IV SOLR
1500.0000 mg | Freq: Once | INTRAVENOUS | Status: AC
  Administered 2011-05-23: 1500 mg via INTRAVENOUS
  Filled 2011-05-23: qty 1500

## 2011-05-23 MED ORDER — ONDANSETRON HCL 4 MG PO TABS: 4.0000 mg | ORAL_TABLET | Freq: Four times a day (QID) | ORAL | Status: DC | PRN

## 2011-05-23 MED ORDER — DARBEPOETIN ALFA-POLYSORBATE 150 MCG/0.3ML IJ SOLN
150.0000 ug | INTRAMUSCULAR | Status: DC
  Administered 2011-05-25: 150 ug via INTRAVENOUS
  Filled 2011-05-23: qty 0.3

## 2011-05-23 MED ORDER — ALTEPLASE 2 MG IJ SOLR
2.0000 mg | Freq: Once | INTRAMUSCULAR | Status: AC | PRN
  Filled 2011-05-23: qty 2

## 2011-05-23 NOTE — H&P (Signed)
[facility]  History and Physical Update  The patient was interviewed and re-examined.  The patient's previous History and Physical has been reviewed and is unchanged except from H&P from 05/22/11.  The patient's INR is supratherapeutic so she will be admitted and her coumadin reversed with Vitamin K.  Her right groin exploration is rescheduled for tomorrow.  Laboratory: CBC:    Component Value Date/Time   WBC 4.8 04/20/2011 1420   RBC 3.99 04/20/2011 1420   HGB 12.9 05/23/2011 0931   HCT 38.0 05/23/2011 0931   PLT 114* 04/20/2011 1420   MCV 87.7 04/20/2011 1420   MCH 27.8 04/20/2011 1420   MCHC 31.7 04/20/2011 1420   RDW 17.6* 04/20/2011 1420   LYMPHSABS 1.3 04/20/2011 1420   MONOABS 0.7 04/20/2011 1420   EOSABS 0.1 04/20/2011 1420   BASOSABS 0.0 04/20/2011 1420    BMP:    Component Value Date/Time   NA 140 05/23/2011 0931   K 4.2 05/23/2011 0931   CL 97 05/01/2011 0911   CO2 30 04/20/2011 1420   GLUCOSE 104* 05/23/2011 0931   BUN 38* 05/01/2011 0911   CREATININE 6.20* 05/01/2011 0911   CALCIUM 9.6 04/20/2011 1420   CALCIUM 8.6 07/07/2009 1725   GFRNONAA 9* 04/20/2011 1420   GFRAA 10* 04/20/2011 1420    Coagulation: Lab Results  Component Value Date   INR 2.36* 05/23/2011   INR 1.10 05/14/2011   INR 0.99 05/01/2011   No results found for this basename: PTT     Leonides Sake, MD [facility] Office: [phone] Pager: [phone]  05/23/2011, 3:30 PM

## 2011-05-23 NOTE — Progress Notes (Addendum)
ANTIBIOTIC CONSULT NOTE - INITIAL  Pharmacy Consult for Vanco/Cipro Indication: R thigh AVG infection with h/o MRSA  Allergies  Allergen Reactions  . Aspirin     Unknown    . Heparin     SHE is HIT neg; platelets have been normal on low dose heparin at out pt dialysis ; not clear why this is listed   . Minoxidil     Unknown    . Penicillins     Unknown      Patient Measurements:   Adjusted Body Weight:   Vital Signs: Temp: 98.4 F (36.9 C) (03/13 0909) Temp src: Oral (03/13 0909) BP: 112/75 mmHg (03/13 0909) Pulse Rate: 59  (03/13 0909) Intake/Output from previous day:   Intake/Output from this shift:    Labs:  Basename 05/23/11 0931  WBC --  HGB 12.9  PLT --  LABCREA --  CREATININE --   The CrCl is unknown because both a height and weight (above a minimum accepted value) are required for this calculation. No results found for this basename: VANCOTROUGH:2,VANCOPEAK:2,VANCORANDOM:2,GENTTROUGH:2,GENTPEAK:2,GENTRANDOM:2,TOBRATROUGH:2,TOBRAPEAK:2,TOBRARND:2,AMIKACINPEAK:2,AMIKACINTROU:2,AMIKACIN:2, in the last 72 hours   Microbiology: Recent Results (from the past 720 hour(s))  SURGICAL PCR SCREEN     Status: Normal   Collection Time   05/14/11  6:41 AM      Component Value Range Status Comment   MRSA, PCR NEGATIVE  NEGATIVE  Final    Staphylococcus aureus NEGATIVE  NEGATIVE  Final     Medical History: Past Medical History  Diagnosis Date  . Atrial fibrillation   . CAD (coronary artery disease)   . Hypertension   . Anemia   . MRSA bacteremia     hx  . Cellulitis   . GERD (gastroesophageal reflux disease)   . Acetabulum fracture   . Contracture of elbow     L arm  . Dementia   . Diabetes mellitus     on Insulin  . Decubitus ulcer, buttock 02/07/2011    Pt presents with dressing to R buttocks/coccyx.  . Pneumonia   . Blood transfusion   . Stroke ~ 1990's    Left side paralysis.  Left  foot drop., L arm contracted.  . Chronic kidney disease  03/13/11    dialysis at [facility]; TTS,last tx 03/12/11 hemo x 20years    Medications:  Prescriptions prior to admission  Medication Sig Dispense Refill  . amLODipine (NORVASC) 5 MG tablet Take 5 mg by mouth at bedtime.      . calcium acetate (PHOSLO) 667 MG capsule Take 667 mg by mouth 3 (three) times daily with meals.       . darbepoetin (ARANESP) 200 MCG/0.4ML SOLN Inject 200 mcg into the skin every 7 (seven) days. Takes on Thursdays.      Marland Kitchen dextromethorphan-guaiFENesin (ROBITUSSIN-DM) 10-100 MG/5ML liquid Take 10 mLs by mouth every 6 (six) hours as needed. For cough      . docusate sodium (COLACE) 100 MG capsule Take 100 mg by mouth 2 (two) times daily.       . DULoxetine (CYMBALTA) 20 MG capsule Take 20 mg by mouth daily.      . feeding supplement (PRO-STAT SUGAR FREE 64) LIQD Take 30 mLs by mouth 2 (two) times daily with a meal.       . insulin glargine (LANTUS) 100 UNIT/ML injection Inject 5 Units into the skin at bedtime.       . lidocaine-prilocaine (EMLA) cream Apply 1 application topically 3 (three) times a week. Tues, thurs, and  sat 30 mins before dialysis      . metoprolol tartrate (LOPRESSOR) 25 MG tablet Take 25-50 mg by mouth See admin instructions. 1 tab twice daily on Sun, Mon., Wed., Fri.; 1 tab daily on Tues, Thurs, and Sat      . multivitamin (RENA-VIT) TABS tablet Take 1 tablet by mouth at bedtime.  30 tablet  5  . Nutritional Supplements (FEEDING SUPPLEMENT, NEPRO CARB STEADY,) LIQD Take 237 mLs by mouth 3 (three) times daily with meals.      Marland Kitchen omeprazole (PRILOSEC) 20 MG capsule Take 20 mg by mouth daily.       Marland Kitchen oxyCODONE-acetaminophen (PERCOCET) 5-325 MG per tablet Take 1-2 tablets by mouth every 4 (four) hours as needed. For pain      . paricalcitol (ZEMPLAR) 5 MCG/ML injection Inject 0.2 mLs (1 mcg total) into the vein 3 (three) times a week.  1 mL    . risperiDONE (RISPERDAL) 0.25 MG tablet Take 0.25 mg by mouth at bedtime.       . simvastatin (ZOCOR) 20 MG tablet  Take 20 mg by mouth at bedtime.       Marland Kitchen warfarin (COUMADIN) 5 MG tablet Take 5 mg by mouth daily.      . bisacodyl (DULCOLAX) 5 MG EC tablet Take 10 mg by mouth daily as needed. For constipation      . calcium carbonate, dosed in mg elemental calcium, 1250 MG/5ML Take 500 mg of elemental calcium by mouth every 6 (six) hours as needed. For acid reflux       Assessment:78 y/o resident of [facility].  She was to have had dbridement of a right groin wound and graft removal today  but surgery was postponed due to an elevated INR (2.36). She recently (3/09) had BC x 2 and groin wound cultures done due to excessive groin drainage which to date have grown Klebsiella pneumonia (sensitivities pending) and coag neg staph consistent with NSF. BC x 2 still pending  Anticoag: Coumadin for afib/CVA. INR 2.36 ID: h/o MRSA bacteremia. Multiple access infections, infected stent, and cellulitis. Cards: Afib, CAD, HTN Endo: DM GI/Nutrition: GERD Renal: HD TTS Neuro: Dementia, CVA with Left-sided paralysis Heme/Onc: Anemia of chronic dz  Goal of Therapy:  Vanco Level 20-25 pre-HD before HD or <20  Post-HD  Plan:  Vanco 1500mg  IV x 1 then 750mg  post-HD Cipro 500mg  po once daily dose ok.  Misty Stanley Stillinger 05/23/2011,4:29 PM

## 2011-05-23 NOTE — Progress Notes (Addendum)
Dr. Sampson Goon notified of PTT/PT/INR elevated. Dr. Imogene Burn notified also of these elevations. Cancelling OR for today and wants patient to be admitted. He is wanting patient to go to [id number]-aware he needs to complete orders. Contacting bed control to let them know.    Dr. Imogene Burn also stated patient could go to [facility] Pl[facility] aware. Told to hold feeding patient until we see orders.

## 2011-05-23 NOTE — Consult Note (Signed)
[facility] Renal Consultation Note  Indication for Consultation:  Management of ESRD/hemodialysis; anemia, hypertension/volume and secondary hyperparathyroidism  HPI: Ebony STRENG is a 78 y.o. female. With ESRD secondary to diabetes on HD since August of 2008.  She has a complex PMHx and currently resides at [facility] on [address].  She was to have had dbridement of a right groin wound and graft removal but surgery was postponed due to an elevated INR. She is on coumadin for a fib and hx CVA.  She recently (3/09) had BC x 2 and groin wound cultures done due to excessive groin drainage which to date have grown Klebsiella pneumonia (sensitivities pending) and coag neg staph consistent with NSF. She has not been febrile at dialysis.  BC x 2 have shown no growth, but are still pending.  She has had multiple episodes of access infections including MRSA infx and LUA graft removal 05/2010; LUA abcess I&D with removal of infected stent 08/2010, removal of infected R forearm AVG in Nov 2012,  and recent  ? Cellulitis 03/2011 tx with empiric Vanc. She was started on Vanc and Fortaz 3/09 and changed to [place] only 3/12.  Dialysis Orders: Center:   [facility] on TTS EDW  66 HD Bath 2K, 2.25 Ca Time 4 hr  Heparin 1800 plus 500 prn. Access right I- J cath BFR 400 DFR 800  Zemplar IV/HD Epogen 21,000 Units IV/HD  Infed 100 x 3 more doses then 50 / week; ;NOTE HEPARIN IS LISTED AS AN ALLERGY (hit+), BUT GIVEN AT THE DIALYSIS CENTER WHERE IT IS ALSO LISTED AS AN ALLERGY    Past Medical History  Diagnosis Date  . Atrial fibrillation   . CAD (coronary artery disease)   . Hypertension   . Anemia   . MRSA bacteremia     hx  . Cellulitis   . GERD (gastroesophageal reflux disease)   . Acetabulum fracture   . Contracture of elbow     L arm  . Dementia   . Diabetes mellitus     on Insulin  . Decubitus ulcer, buttock 02/07/2011    Pt presents with dressing to R buttocks/coccyx.  .  Pneumonia   . Blood transfusion   . Stroke ~ 1990's    Left side paralysis.  Left  foot drop., L arm contracted.  . Chronic kidney disease 03/13/11    dialysis at [facility]; TTS,last tx 03/12/11 hemo x 20years   Past Surgical History  Procedure Date  . Arteriovenous graft placement   . Appendectomy   . Cholecystectomy   . Tubal ligation     bilateral  . Abdominal hysterectomy   . Av fistula placement 02/07/2011    Procedure: INSERTION OF ARTERIOVENOUS (AV) GORE-TEX GRAFT THIGH;  Surgeon: Pryor Ochoa, MD;  Location: [facility] OR;  Service: Vascular;  Laterality: Right;  insertion AVGG right thigh  . Av fistula placement 02/12/2011    Procedure: INSERTION OF ARTERIOVENOUS (AV) GORE-TEX GRAFT THIGH;  Surgeon: Nilda Simmer, MD;  Location: [facility] OR;  Service: Vascular;  Laterality: Right;  . Cataract extraction w/ intraocular lens  implant, bilateral    Family History  Problem Relation Age of Onset  . Diabetes Mother   . Hypertension Father   . Anesthesia problems Neg Hx   . Hypotension Neg Hx   . Malignant hyperthermia Neg Hx   . Pseudochol deficiency Neg Hx    Social History: Resides in SNF  reports that she quit smoking  about 11 years ago. Her smoking use included Cigarettes. She has a 7.5 pack-year smoking history. She has never used smokeless tobacco. She reports that she does not drink alcohol or use illicit drugs. Allergies  Allergen Reactions  . Aspirin     Unknown    . Heparin     SHE is HIT neg; platelets have been normal on low dose heparin at out pt dialysis ; not clear why this is listed   . Minoxidil     Unknown    . Penicillins     Unknown     Prior to Admission medications   Medication Sig Start Date End Date Taking? Authorizing Provider  amLODipine (NORVASC) 5 MG tablet Take 5 mg by mouth at bedtime. 03/17/11 03/16/12 Yes Srikar Cherlynn Kaiser, MD  calcium acetate (PHOSLO) 667 MG capsule Take 667 mg by mouth 3 (three) times daily with meals.  09/01/10  Yes Historical  Provider, MD         dextromethorphan-guaiFENesin (ROBITUSSIN-DM) 10-100 MG/5ML liquid Take 10 mLs by mouth every 6 (six) hours as needed. For cough   Yes Historical Provider, MD  docusate sodium (COLACE) 100 MG capsule Take 100 mg by mouth 2 (two) times daily.    Yes Historical Provider, MD  DULoxetine (CYMBALTA) 20 MG capsule Take 20 mg by mouth daily.   Yes Historical Provider, MD  feeding supplement (PRO-STAT SUGAR FREE 64) LIQD Take 30 mLs by mouth 2 (two) times daily with a meal.    Yes Historical Provider, MD  insulin glargine (LANTUS) 100 UNIT/ML injection Inject 5 Units into the skin at bedtime.    Yes Historical Provider, MD  lidocaine-prilocaine (EMLA) cream Apply 1 application topically 3 (three) times a week. Tues, thurs, and sat 30 mins before dialysis 09/07/10  Yes Historical Provider, MD  metoprolol tartrate (LOPRESSOR) 25 MG tablet Take 25-50 mg by mouth See admin instructions. 1 tab twice daily on Sun, Mon., Wed., Fri.; 1 tab daily on Tues, Thurs, and Sat   Yes Historical Provider, MD  multivitamin (RENA-VIT) TABS tablet Take 1 tablet by mouth at bedtime. 01/15/11  Yes Marlowe Shores, PA  Nutritional Supplements (FEEDING SUPPLEMENT, NEPRO CARB STEADY,) LIQD Take 237 mLs by mouth 3 (three) times daily with meals. 03/17/11  Yes Srikar Cherlynn Kaiser, MD  omeprazole (PRILOSEC) 20 MG capsule Take 20 mg by mouth daily.    Yes Historical Provider, MD  oxyCODONE-acetaminophen (PERCOCET) 5-325 MG per tablet Take 1-2 tablets by mouth every 4 (four) hours as needed. For pain 05/14/11  Yes Lars Mage, PA  paricalcitol (ZEMPLAR) 2 MCG/ML injection Inject 0.2 mLs (1 mcg total) into the vein 3 (three) times a week. 03/17/11  Yes Srikar Cherlynn Kaiser, MD  risperiDONE (RISPERDAL) 0.25 MG tablet Take 0.25 mg by mouth at bedtime.    Yes Historical Provider, MD  simvastatin (ZOCOR) 20 MG tablet Take 20 mg by mouth at bedtime.    Yes Historical Provider, MD  warfarin (COUMADIN) 5 MG tablet Take 5 mg by mouth daily.    Yes Historical Provider, MD  bisacodyl (DULCOLAX) 5 MG EC tablet Take 10 mg by mouth daily as needed. For constipation    Historical Provider, MD  calcium carbonate, dosed in mg elemental calcium, 1250 MG/5ML Take 500 mg of elemental calcium by mouth every 6 (six) hours as needed. For acid reflux 01/15/11   Marlowe Shores, [place]    Labs:  Basic Metabolic Panel:  Lab 05/23/11 8413  NA 140  K 4.2  CL --  CO2 --  GLUCOSE 104*  BUN --  CREATININE --  CALCIUM --  ALB --  PHOS --   LCBC:  Lab 05/23/11 0931  WBC --  NEUTROABS --  HGB 12.9  HCT 38.0  MCV --  PLT --  CBG:  Lab 05/23/11 1337 05/23/11 1244 05/23/11 1142  GLUCAP 87 96 91   INR: Lab Results  Component Value Date   INR 2.36* 05/23/2011   INR 1.10 05/14/2011   INR 0.99 05/01/2011   Studies/Results: No results found.  ROS: Denies fever, chills, nausea or vomiting; does not know how long groin has been draining, no chest pain or SOB, chronic inability to use left arm due to CVA, chronic right leg swelling; appetite good; " I don't remember things well"  Physical Exam: Filed Vitals:   05/23/11 0909  BP: 112/75  Pulse: 59  Temp: 98.4 F (36.9 C)  TempSrc: Oral  Resp: 18  SpO2: 99%     General: Well developed, fairly well nourished, in no acute distress supine in bed. Head: Normocephalic, atraumatic, sclera non-icteric, mucus membranes are moist Neck: Supple. JVD not elevated. Lungs: Clear bilaterally to auscultation without wheezes, rales, or rhonchi. Breathing is unlabored. Heart: RRR with S1 S2. No murmurs, rubs, or gallops appreciated. Abdomen: Soft, non-tender,  Groin: right groin dressing from 3/12 with significant drainage on dressing (RN to change) Lower extremities left without edema right LE chronic swelling with groin wound; chronic left upper extremity contracture  Neuro: Alert and oriented X 3; baseline Psych:  Responds to questions appropriately with a normal affect. Dialysis Access: active  right I- J  Assessment/Plan: 1. Infected right groin wound s/p ligation of right thigh graft 3/04- this graft was placed on 02/07/2011. Preliminary culture + kleb pneumo on Fortaz since 3/09 as outpt at dialysis; s/p 2 gm Fortaz yesterday. ? Continue the same starting 3/14 or broaden coverage; planning for debridement of groin and graft removal 3/14 2. ESRD -  TTS  - orders written; need to coordinate around surgery 3 Hypertension/volume  - volume control and meds - on norvasc and metoprolol as above; BP ok now. 4. Anemia  - Hgb today likely false elevation due to istat; Hgb 10.7 3/07; continue ESA and IV Fe 5. Metabolic bone disease -  Continue zemplar 2 6. Nutrition -renal diet + supplements 7. A fib/Chronic coumadin - pharmacy dosing coumadin; vit K given to correct INR pre surgery  8. DM - SSI 9. Thrombocytopenia - intermittent - seems to be related to infx; HIT neg; check CBC with am labs; plts 186 K 2/228 10. Hx CVA w/ left contractures 11. Hx removal infected LUA AVG Mar 2012 (MRSA) and removal of infected R forearm AVG Nov 2012   Sheffield Slider, [facility] Beeper [phone] 05/23/2011, 3:43 PM   Patient seen and examined and agree with assessment and plan as above. Patient admitted with R groin wound infection for exploration by vascular surgery.  The AV thigh graft associated with this wound was placed in Nov 2012, and was ligated recently.  Outpt wound drainage cultures grew Klebsiella pneumoniae, will get hard copy of sensitivities.  Has a tunnelled HD cath using for dialysis currently.  Is otherwise stable, doing well in spite of chronic debility from CVA in the past.  See above for recommendations.  Will follow.     [name]  MD [employer] [phone] pgr    [phone] cell 05/23/2011,  7:19 PM

## 2011-05-24 ENCOUNTER — Inpatient Hospital Stay (HOSPITAL_COMMUNITY): Payer: Medicare Other | Admitting: Anesthesiology

## 2011-05-24 ENCOUNTER — Encounter (HOSPITAL_COMMUNITY)
Admission: RE | Disposition: A | Discharge status: Skilled Nursing Facility | Payer: Self-pay | Source: Ambulatory Visit | Attending: Vascular Surgery

## 2011-05-24 ENCOUNTER — Encounter (HOSPITAL_COMMUNITY): Payer: Self-pay | Admitting: Anesthesiology

## 2011-05-24 DIAGNOSIS — T827XXA Infection and inflammatory reaction due to other cardiac and vascular devices, implants and grafts, initial encounter: Secondary | ICD-10-CM

## 2011-05-24 HISTORY — PX: AVGG REMOVAL: SHX5153

## 2011-05-24 HISTORY — PX: APPLICATION OF WOUND VAC: SHX5189

## 2011-05-24 LAB — CBC
HCT: 37.5 % (ref 36.0–46.0)
Hemoglobin: 11.2 g/dL — ABNORMAL LOW (ref 12.0–15.0)
MCH: 26.7 pg (ref 26.0–34.0)
MCHC: 29.9 g/dL — ABNORMAL LOW (ref 30.0–36.0)
RBC: 4.2 MIL/uL (ref 3.87–5.11)

## 2011-05-24 LAB — GLUCOSE, CAPILLARY
Glucose-Capillary: 155 mg/dL — ABNORMAL HIGH (ref 70–99)
Glucose-Capillary: 138 mg/dL — ABNORMAL HIGH (ref 70–99)

## 2011-05-24 LAB — RENAL FUNCTION PANEL
BUN: 27 mg/dL — ABNORMAL HIGH (ref 6–23)
CO2: 26 mEq/L (ref 19–32)
Chloride: 99 mEq/L (ref 96–112)
Creatinine, Ser: 6.35 mg/dL — ABNORMAL HIGH (ref 0.50–1.10)
GFR calc non Af Amer: 6 mL/min — ABNORMAL LOW (ref >90)
Potassium: 4 mEq/L (ref 3.5–5.1)

## 2011-05-24 LAB — PROTIME-INR: INR: 1.68 — ABNORMAL HIGH (ref 0.00–1.49)

## 2011-05-24 SURGERY — REMOVAL OF ARTERIOVENOUS GORETEX GRAFT (AVGG)
Anesthesia: General | Site: Thigh | Laterality: Right

## 2011-05-24 MED ORDER — FENTANYL CITRATE 0.05 MG/ML IJ SOLN
25.0000 ug | INTRAMUSCULAR | Status: DC | PRN
  Administered 2011-05-28 – 2011-05-30 (×2): 50 ug via INTRAVENOUS
  Filled 2011-05-24 (×2): qty 2

## 2011-05-24 MED ORDER — 0.9 % SODIUM CHLORIDE (POUR BTL) OPTIME
TOPICAL | Status: DC | PRN
  Administered 2011-05-24: 1000 mL

## 2011-05-24 MED ORDER — SODIUM CHLORIDE 0.9 % IV SOLN
500.0000 mg | INTRAVENOUS | Status: DC
  Administered 2011-05-25 – 2011-05-28 (×5): 500 mg via INTRAVENOUS
  Filled 2011-05-24 (×8): qty 0.5

## 2011-05-24 MED ORDER — FENTANYL CITRATE 0.05 MG/ML IJ SOLN
INTRAMUSCULAR | Status: DC | PRN
  Administered 2011-05-24 (×2): 50 ug via INTRAVENOUS
  Administered 2011-05-24 (×2): 25 ug via INTRAVENOUS
  Administered 2011-05-24: 50 ug via INTRAVENOUS
  Administered 2011-05-24 (×2): 25 ug via INTRAVENOUS
  Administered 2011-05-24: 50 ug via INTRAVENOUS

## 2011-05-24 MED ORDER — SODIUM CHLORIDE 0.9 % IR SOLN
Status: DC | PRN
  Administered 2011-05-24: 3000 mL

## 2011-05-24 MED ORDER — HEPARIN SODIUM (PORCINE) 1000 UNIT/ML IJ SOLN
INTRAMUSCULAR | Status: DC | PRN
  Administered 2011-05-24: 3000 [IU] via INTRAVENOUS

## 2011-05-24 MED ORDER — MIDAZOLAM HCL 2 MG/2ML IJ SOLN: 0.5000 mg | Freq: Once | INTRAMUSCULAR | Status: AC | PRN

## 2011-05-24 MED ORDER — MEPERIDINE HCL 25 MG/ML IJ SOLN: 6.2500 mg | INTRAMUSCULAR | Status: DC | PRN

## 2011-05-24 MED ORDER — THROMBIN 20000 UNITS EX KIT
PACK | CUTANEOUS | Status: DC | PRN
  Administered 2011-05-24: 18:00:00 via TOPICAL

## 2011-05-24 MED ORDER — LIDOCAINE HCL (CARDIAC) 20 MG/ML IV SOLN
INTRAVENOUS | Status: DC | PRN
  Administered 2011-05-24: 50 mg via INTRAVENOUS

## 2011-05-24 MED ORDER — ONDANSETRON HCL 4 MG/2ML IJ SOLN
INTRAMUSCULAR | Status: DC | PRN
  Administered 2011-05-24: 4 mg via INTRAVENOUS

## 2011-05-24 MED ORDER — EPHEDRINE SULFATE 50 MG/ML IJ SOLN
INTRAMUSCULAR | Status: DC | PRN
  Administered 2011-05-24: 5 mg via INTRAVENOUS

## 2011-05-24 MED ORDER — PROPOFOL 10 MG/ML IV EMUL
INTRAVENOUS | Status: DC | PRN
  Administered 2011-05-24: 130 mg via INTRAVENOUS
  Administered 2011-05-24: 20 mg via INTRAVENOUS

## 2011-05-24 MED ORDER — ACETAMINOPHEN 325 MG PO TABS
325.0000 mg | ORAL_TABLET | ORAL | Status: DC | PRN
  Administered 2011-05-25 – 2011-05-28 (×2): 650 mg via ORAL
  Filled 2011-05-24 (×2): qty 2

## 2011-05-24 MED ORDER — PROTAMINE SULFATE 10 MG/ML IV SOLN
INTRAVENOUS | Status: DC | PRN
  Administered 2011-05-24: 30 mg via INTRAVENOUS

## 2011-05-24 MED ORDER — SODIUM CHLORIDE 0.9 % IV SOLN
INTRAVENOUS | Status: DC | PRN
  Administered 2011-05-24: 15:00:00 via INTRAVENOUS

## 2011-05-24 MED ORDER — PROMETHAZINE HCL 25 MG/ML IJ SOLN: 6.2500 mg | INTRAMUSCULAR | Status: DC | PRN

## 2011-05-24 SURGICAL SUPPLY — 58 items
0.9% SODIUM CHLORIDE 3000ML ×3 IMPLANT
BANDAGE ELASTIC 6 VELCRO ST LF (GAUZE/BANDAGES/DRESSINGS) ×3 IMPLANT
BANDAGE GAUZE ELAST BULKY 4 IN (GAUZE/BANDAGES/DRESSINGS) ×6 IMPLANT
CANISTER SUCTION 2500CC (MISCELLANEOUS) ×3 IMPLANT
CLIP TI MEDIUM 6 (CLIP) ×3 IMPLANT
CLIP TI WIDE RED SMALL 6 (CLIP) ×3 IMPLANT
CLOTH BEACON ORANGE TIMEOUT ST (SAFETY) ×3 IMPLANT
COVER PROBE W GEL 5X96 (DRAPES) ×3 IMPLANT
COVER SURGICAL LIGHT HANDLE (MISCELLANEOUS) ×6 IMPLANT
DECANTER SPIKE VIAL GLASS SM (MISCELLANEOUS) IMPLANT
DERMABOND ADVANCED (GAUZE/BANDAGES/DRESSINGS) ×1
DERMABOND ADVANCED .7 DNX12 (GAUZE/BANDAGES/DRESSINGS) ×2 IMPLANT
DRSG PAD ABDOMINAL 8X10 ST (GAUZE/BANDAGES/DRESSINGS) ×3 IMPLANT
DRSG VAC ATS MED SENSATRAC (GAUZE/BANDAGES/DRESSINGS) ×3 IMPLANT
ELECT CAUTERY BLADE 6.4 (BLADE) ×3 IMPLANT
ELECT REM PT RETURN 9FT ADLT (ELECTROSURGICAL) ×6
ELECTRODE REM PT RTRN 9FT ADLT (ELECTROSURGICAL) ×4 IMPLANT
GAUZE SPONGE 4X4 16PLY XRAY LF (GAUZE/BANDAGES/DRESSINGS) ×9 IMPLANT
GLOVE BIO SURGEON STRL SZ7 (GLOVE) ×6 IMPLANT
GLOVE BIOGEL M 6.5 STRL (GLOVE) ×3 IMPLANT
GLOVE BIOGEL PI IND STRL 6.5 (GLOVE) ×2 IMPLANT
GLOVE BIOGEL PI IND STRL 7.0 (GLOVE) ×4 IMPLANT
GLOVE BIOGEL PI IND STRL 7.5 (GLOVE) ×2 IMPLANT
GLOVE BIOGEL PI INDICATOR 6.5 (GLOVE) ×1
GLOVE BIOGEL PI INDICATOR 7.0 (GLOVE) ×2
GLOVE BIOGEL PI INDICATOR 7.5 (GLOVE) ×1
GLOVE ECLIPSE 6.5 STRL STRAW (GLOVE) ×6 IMPLANT
GLOVE SURG SS PI 7.0 STRL IVOR (GLOVE) ×3 IMPLANT
GOWN STRL NON-REIN LRG LVL3 (GOWN DISPOSABLE) ×9 IMPLANT
HANDPIECE INTERPULSE COAX TIP (DISPOSABLE) ×1
HEMOSTAT SURGICEL 2X14 (HEMOSTASIS) IMPLANT
KIT BASIN OR (CUSTOM PROCEDURE TRAY) ×3 IMPLANT
KIT ROOM TURNOVER OR (KITS) ×3 IMPLANT
NS IRRIG 1000ML POUR BTL (IV SOLUTION) ×3 IMPLANT
PACK CV ACCESS (CUSTOM PROCEDURE TRAY) ×3 IMPLANT
PAD ARMBOARD 7.5X6 YLW CONV (MISCELLANEOUS) ×6 IMPLANT
PAD NEG PRESSURE SENSATRAC (MISCELLANEOUS) ×3 IMPLANT
PATCH VASCULAR VASCU GUARD 1X6 (Vascular Products) ×3 IMPLANT
PENCIL BUTTON HOLSTER BLD 10FT (ELECTRODE) ×3 IMPLANT
SET HNDPC FAN SPRY TIP SCT (DISPOSABLE) ×2 IMPLANT
SPONGE GAUZE 4X4 12PLY (GAUZE/BANDAGES/DRESSINGS) ×3 IMPLANT
SPONGE LAP 18X18 X RAY DECT (DISPOSABLE) ×6 IMPLANT
SPONGE SURGIFOAM ABS GEL 100 (HEMOSTASIS) ×3 IMPLANT
STAPLER VISISTAT 35W (STAPLE) ×6 IMPLANT
SUT MNCRL AB 4-0 PS2 18 (SUTURE) IMPLANT
SUT PROLENE 5 0 C 1 24 (SUTURE) ×27 IMPLANT
SUT PROLENE 6 0 BV (SUTURE) ×30 IMPLANT
SUT VIC AB 2-0 CT1 27 (SUTURE) ×2
SUT VIC AB 2-0 CT1 TAPERPNT 27 (SUTURE) ×4 IMPLANT
SUT VIC AB 3-0 SH 27 (SUTURE) ×4
SUT VIC AB 3-0 SH 27X BRD (SUTURE) ×8 IMPLANT
SWAB COLLECTION DEVICE MRSA (MISCELLANEOUS) ×3 IMPLANT
THROMBIN 20,000UNITS ×3 IMPLANT
TOWEL OR 17X24 6PK STRL BLUE (TOWEL DISPOSABLE) ×3 IMPLANT
TOWEL OR 17X26 10 PK STRL BLUE (TOWEL DISPOSABLE) ×3 IMPLANT
TUBE ANAEROBIC SPECIMEN COL (MISCELLANEOUS) ×3 IMPLANT
UNDERPAD 30X30 INCONTINENT (UNDERPADS AND DIAPERS) ×3 IMPLANT
WATER STERILE IRR 1000ML POUR (IV SOLUTION) ×3 IMPLANT

## 2011-05-24 NOTE — Op Note (Signed)
OPERATIVE NOTE   PROCEDURE: 1. Excision of right thigh arteriovenous graft 2. Bovine patch angioplasty of the right common femoral artery  3. Placement of VAC dressing right groin  PRE-OPERATIVE DIAGNOSIS: Possible infected right thigh arteriovenous graft   POST-OPERATIVE DIAGNOSIS: Same as above   SURGEON: Leonides Sake, MD  ASSISTANT(S): Della Goo, [facility]   ANESTHESIA: general  ESTIMATED BLOOD LOSS: 400 cc  FINDING(S): 1. Frank pus in right groin with evidence of poorly incorporated arteriovenous graft  2. Severe scarring in right groin with difficult to define anatomy  SPECIMEN(S):  Anaerobic and aerobic cultures of right groin fluid, anaerobic and aerobic culture of arteriovenous graft   INDICATIONS:   Ebony [name] is a 78 y.o. female who presents with possible infected right arteriovenous graft.  Recently she underwent ligation of her right thigh arteriovenous graft for right leg swelling due to occluded right common femoral vein.  Since then she has developed drainage from the right groin concerning for pus.  Risk, benefits, and alternatives to access surgery were discussed.  The patient is aware the risks include but are not limited to: bleeding, infection, nerve damage, artery and vein damage, and need for additional procedures.  The patient agrees to proceed forward with the procedure.  DESCRIPTION: After obtain full informed written consent, the patient was brought back to the operating room and placed supine upon the operating table.  The patient received IV antibiotics prior to induction.  After obtaining adequate anesthesia, the patient was prepped and draped in the standard fashion for: right groin exploration and possible right thigh arteriovenous graft excision.  The right groin incision was already partially separated.  I sharply transected the sutures and dissected down to the floor of the wound.  There was frank pus present.  I took cultures of this fluid.  After  washing out this groin wound, I turned my attention to the graft.  Using the Sonosite, I identified the graft and made skip incisions over the graft.  I dissected down to the graft with electrocautery and blunt dissection.  In this fashion, I mobilized the graft through its tunnel.  Portions of this graft appeared somewhat slimy and extracted with minimal difficulty.  I was able to mobilized the graft all the way up the venous anastomosis.  An additional incision had to made in the right groin to improve visualization of this portion of the graft.  I transected the majority of this graft, leaving a short segment attached to the vein.  This graft was passed off the field as a specimen for cultures.  I then began dissecting using electrocautery and sharp dissection through the severe scarring in this right groin.  There was evidence of partial granulation tissue formation, which further obscured the anatomy.  I was finally able to fully dissect the common femoral artery proximal and distal to the segment with a stump of graft previously left.  I was also able to partial dissect out the common femoral vein.  However, this dissection resulted in multiple arteriotomies and venotomies and nearly 300 cc of blood loss had occurred, mainly from venous injuries.   All vessel injuries were repaired with 6-0 Prolene.  I did not think it was going to be safe to fully dissect out the common femoral vein, so I elected to leave a small rim of graft on the common femoral vein.  I first clamped the vein and graft with a Cooley clamp and transected all but a rim of graft.  The  rim was oversewn with a double layer of 5-0 Prolene.  I gave the patient 3000 units of Heparin intravenous, which she is not allergic despite what the chart says.  I then proceeded to clamp the common femoral artery proximally and distally.  Sharply, I took off the graft with a 11-blade.  There was extensive plaque in this artery, but due to the limited  exposure, I did not think an endarterectomy could be completed in this extremely scarred groin.  I removed some of the loose intimal flaps, and then fashion a patch of bovine pericardium.  The patch was sewn in place to the common femoral artery with a running stitch of 6-0 Prolene.  I released all clamp and a good pulse was present in this common femoral artery.  At this point, I placed thrombin and gelfoam into the groin wound and gave the patient 30 mg of Protamine to reverse the Heparin.  All wound were inspected for bleeding and active bleeders controlled with electrocautery.  I washed out all wounds with 3 L of sterile normal saline using the Pulsavac.  I then sewed the tunnel going to the groin closed on the venous side with a running stitch of 2-0 Vicryl, in order to improve the suction from a VAC dressing.  The arterial tunnel had collapsed at this time.  I packed a sterile Kerlix soaked with normal saline into the entire tunnel through the incision at the apex.  I then proceed to staple close all incision except the groin incision and the apical incision through which the Kerlix was exiting.  In such fashion the Kerlix will act as a wick for infection as it is slowly backed out over the weekend.  I then turned my attention to the right groin.  The granulation tissue obscured the anatomy, so I did not think it was safe to try to mobilize a sartorius flap.  I elected to reapproximate the tissue immediately over the artery and vein with interrupted stitches of 2-0 Vicryl.  I then fashioned a small VAC sponge to the geometry of this right groin.  This was secured in place with strips of the adhesive dressing.  I then cut a hole in the center of the dressing and attached the lily pad.  The [facility] circuit was connected to the [facility] machine and the machine was set to 125 continuous.  The VAC was adherent without any obvious leaks.  At this point, the right thigh was washed off.  Sterile 4x4 dressings were placed over  each uncovered staple line and abd pad applied to anterior thigh.  These were held in placed with a ACE wrap.    COMPLICATIONS: multiple vessel injuries  CONDITION: stable  Leonides Sake, MD [facility] Office: [phone] Pager: [phone]  05/24/2011, 7:10 PM

## 2011-05-24 NOTE — Progress Notes (Signed)
Md on call for [facility] contacted concerning HD orders for today post surgery. Pt had not received HD upon return form PACU. Per MD Parks Ranger to occur tonight, pt sheduled late due to emergencies. Pt informed of plan. Pharmacy notified concerning post HD ABX.  Chief Walkup,RN,BSN

## 2011-05-24 NOTE — Progress Notes (Signed)
Pt received her Vitamin K much later than scheduled this evening, was just administered at 0025.  Spoke with a pharmacist who informed me that the Vitamin K would need longer to take effect than when labs are scheduled to be drawn and that it will take more time for her INR to be reversed.  Notified Dr. Hart Rochester of the situation and he agreed that pushing back labs to be drawn at 0700 this morning would be fine.  Wanted to make sure this would not affect the scheduled procedure in the morning, as I was not sure of the time of procedure.  Will continue to monitor.  Arva Chafe

## 2011-05-24 NOTE — Transfer of Care (Signed)
Immediate Anesthesia Transfer of Care Note  Patient: Ebony [name]  Procedure(s) Performed: Procedure(s) (LRB): REMOVAL OF ARTERIOVENOUS GORETEX GRAFT (AVGG) (Right) APPLICATION OF WOUND VAC (Right)  Patient Location: PACU  Anesthesia Type: General  Level of Consciousness: awake, alert  and oriented  Airway & Oxygen Therapy: Patient Spontanous Breathing and Patient connected to nasal cannula oxygen  Post-op Assessment: Report given to PACU RN and Post -op Vital signs reviewed and stable  Post vital signs: Reviewed and stable  Complications: No apparent anesthesia complications

## 2011-05-24 NOTE — Progress Notes (Signed)
Rn paged Md Imogene Burn concerning pt being brought to floor with NSL running through Right sub hemo port. Per MD new order given for IV team to pack hemo port with heparin.    Erandy Mceachern,Rn,BSN

## 2011-05-24 NOTE — Preoperative (Addendum)
Beta Blockers   Reason not to administer Beta Blockers:Not Applicable, Hold  beta blocker due to Bradycardia (HR less than 50 bpm) 

## 2011-05-24 NOTE — Progress Notes (Signed)
Subjective:  Alert, no distress, awaiting surgery.   Objective:    Vital signs in last 24 hours: Filed Vitals:   05/23/11 1628 05/23/11 2108 05/24/11 0728 05/24/11 1310  BP: 135/69 130/67 150/71 151/69  Pulse: 57 58 55 56  Temp: 98.2 F (36.8 C) 98.6 F (37 C) 98.5 F (36.9 C) 98.6 F (37 C)  TempSrc: Oral Oral Oral Oral  Resp: 14 19 18 17   Height: 5' 6.5" (1.689 m)     Weight: 66.3 kg (146 lb 2.6 oz)  66.588 kg (146 lb 12.8 oz)   SpO2: 98% 95% 98% 97%   Weight change:   Intake/Output Summary (Last 24 hours) at 05/24/11 1419 Last data filed at 05/24/11 0025  Gross per 24 hour  Intake    630 ml  Output      0 ml  Net    630 ml   Labs: Basic Metabolic Panel:  Lab 05/24/11 1610 05/23/11 0931  NA 139 140  K 4.0 4.2  CL 99 --  CO2 26 --  GLUCOSE 163* 104*  BUN 27* --  CREATININE 6.35* --  ALB -- --  CALCIUM 9.1 --  PHOS 3.9 --   Liver Function Tests:  Lab 05/24/11 0620  AST --  ALT --  ALKPHOS --  BILITOT --  PROT --  ALBUMIN 2.4*   No results found for this basename: LIPASE:3,AMYLASE:3 in the last 168 hours No results found for this basename: AMMONIA:3 in the last 168 hours CBC:  Lab 05/24/11 0620 05/23/11 0931  WBC 3.1* --  NEUTROABS -- --  HGB 11.2* 12.9  HCT 37.5 38.0  MCV 89.3 --  PLT 225 --   Cardiac Enzymes: No results found for this basename: CKTOTAL:5,CKMB:5,CKMBINDEX:5,TROPONINI:5 in the last 168 hours CBG:  Lab 05/24/11 1123 05/24/11 0702 05/23/11 2213 05/23/11 1609 05/23/11 1337  GLUCAP 138* 155* 261* 133* 87    Iron Studies: No results found for this basename: IRON:30,TIBC:30,SATURATION RATIOS:30,TRANSFERRIN:30,FERRITIN:30 in the last 168 hours Studies/Results: No results found. Medications:      . calcium acetate  667 mg Oral TID WC  . ciprofloxacin  500 mg Oral q1800  . darbepoetin (ARANESP) injection - DIALYSIS  150 mcg Intravenous Q Thu-HD  . feeding supplement (NEPRO CARB STEADY)  237 mL Oral TID WC  . ferric gluconate  (FERRLECIT/NULECIT) IV  125 mg Intravenous Q T,Th,Sa-HD  . multivitamin  1 tablet Oral QHS  . paricalcitol  2 mcg Intravenous 3 times weekly  . phytonadione (VITAMIN K) IV  2 mg Intravenous Once  . vancomycin  1,500 mg Intravenous Once  . vancomycin  750 mg Intravenous Q T,Th,Sa-HD  . DISCONTD: sodium chloride  3 mL Intravenous Q12H    I  have reviewed scheduled and prn medications.  Physical Exam:  Blood pressure 151/69, pulse 56, temperature 98.6 F (37 C), temperature source Oral, resp. rate 17, height 5' 6.5" (1.689 m), weight 66.588 kg (146 lb 12.8 oz), SpO2 97.00%.  General: stable, alert in no distress Neck: Supple. JVD not elevated.  Lungs: Clear bilaterally to auscultation without wheezes, rales, or rhonchi. Breathing is unlabored.  Heart: RRR with S1 S2. No murmurs, rubs, or gallops appreciated.  Abdomen: Soft, non-tender Groin: right groin dressing in place Lower extremities left without edema, right LE chronic swelling with groin wound; chronic left upper extremity contracture  Neuro: Alert and oriented X 3; baseline  Psych: Responds to questions appropriately with a normal affect.  Dialysis Access: active right IJ HD cath  Dialysis Orders: Center: [facility] on TTS EDW 66 HD Bath 2K, 2.25 Ca Time 4 hr Heparin 1800 plus 500 prn. Access right I- J cath BFR 400 DFR 800 Zemplar IV/HD Epogen 21,000 Units IV/HD Infed 100 x 3 more doses then 50 / week; ;NOTE HEPARIN IS LISTED AS AN ALLERGY (hit+), BUT GIVEN AT THE DIALYSIS CENTER WHERE IT IS ALSO LISTED AS AN ALLERGY   Assessment/Recommendations 1. Infected right groin wound s/p ligation of right thigh graft 3/04- this graft was placed on 02/07/2011, ligated on 3/04.  Preliminary outpt culture + kleb pneumo and coag neg staph, on Fortaz since 3/09 as outpt at dialysis. Called [facility] labs and KP was indeterminate to levaquin, sensitive to mero, erta, gent, tigacil and cefipime. Will change abx (see orders). The coag neg staph was very  light growth and prob skin contaminant.  2. ESRD - TTS - orders written; need to coordinate around surgery. HD today.  3 Hypertension/volume - volume control and meds - on norvasc and metoprolol as above; BP ok now.  4. Anemia - Hgb today likely false elevation due to istat; Hgb 10.7 3/07; continue ESA and IV Fe  5. Metabolic bone disease - Continue zemplar 2  6. Nutrition -renal diet + supplements  7. A fib/Chronic coumadin - pharmacy dosing coumadin; vit K given to correct INR pre surgery  8. DM - SSI  9. Thrombocytopenia - intermittent - seems to be related to infx; HIT neg; check CBC with am labs; plts 186 K 2/228  10. Hx CVA w/ left contractures  11. Hx removal infected LUA AVG Mar 2012 (MRSA) and removal of infected R forearm AVG Nov 2012  [name]  MD [facility] [phone] pgr    [phone] cell 05/24/2011, 2:19 PM

## 2011-05-24 NOTE — Anesthesia Postprocedure Evaluation (Signed)
  Anesthesia Post-op Note  Patient: Ebony [name]  Procedure(s) Performed: Procedure(s) (LRB): REMOVAL OF ARTERIOVENOUS GORETEX GRAFT (AVGG) (Right) APPLICATION OF WOUND VAC (Right)  Patient Location: PACU  Anesthesia Type: General  Level of Consciousness: awake  Airway and Oxygen Therapy: Patient Spontanous Breathing and Patient connected to nasal cannula oxygen  Post-op Pain: mild  Post-op Assessment: Post-op Vital signs reviewed, Patient's Cardiovascular Status Stable, Respiratory Function Stable, Patent Airway and No signs of Nausea or vomiting  Post-op Vital Signs: Reviewed and stable  Complications: No apparent anesthesia complications

## 2011-05-24 NOTE — Anesthesia Procedure Notes (Signed)
Procedure Name: LMA Insertion Date/Time: 05/24/2011 3:36 PM Performed by: Romie Minus K Pre-anesthesia Checklist: Patient identified, Emergency Drugs available, Suction available and Patient being monitored Patient Re-evaluated:Patient Re-evaluated prior to inductionOxygen Delivery Method: Circle system utilized Preoxygenation: Pre-oxygenation with 100% oxygen Intubation Type: IV induction Ventilation: Mask ventilation without difficulty LMA: LMA with gastric port inserted Number of attempts: 1 Dental Injury: Teeth and Oropharynx as per pre-operative assessment

## 2011-05-24 NOTE — Anesthesia Preprocedure Evaluation (Addendum)
Anesthesia Evaluation  Patient identified by MRN, date of birth, ID band Patient awake    Reviewed: Allergy & Precautions, H&P , NPO status , Patient's Chart, lab work & pertinent test results, reviewed documented beta blocker date and time   Airway Mallampati: III TM Distance: >3 FB Neck ROM: full    Dental  (+) Poor Dentition   Pulmonary pneumonia ,          Cardiovascular hypertension, + CAD + dysrhythmias Atrial Fibrillation + Valvular Problems/Murmurs MR Rhythm:irregular Rate:Normal     Neuro/Psych PSYCHIATRIC DISORDERS CVA    GI/Hepatic GERD-  ,  Endo/Other  Diabetes mellitus-  Renal/GU ESRF, Dialysis and CRF     Musculoskeletal   Abdominal   Peds  Hematology   Anesthesia Other Findings Atrial fibrillation     CAD (coronary artery disease)        Hypertension     Anemia        MRSA bacteremia   hx Cellulitis        GERD (gastroesophageal reflux disease)     Acetabulum fracture        Contracture of elbow   L arm Dementia        Diabetes mellitus   on Insulin Decubitus ulcer, buttock 02/07/2011 Pt presents with dressing to R buttocks/coccyx.    Pneumonia     Blood transfusion        Stroke ~ 1990's Left side paralysis.  Left  foot drop., L arm contracted. Chronic kidney disease 03/13/11 dialysis at [facility]; TTS,last tx 03/12/11 hemo x 20years  Mild MR on echo   Reproductive/Obstetrics                          Anesthesia Physical  Anesthesia Plan  ASA: IV  Anesthesia Plan: General   Post-op Pain Management:    Induction: Intravenous  Airway Management Planned: LMA, Oral ETT and Mask  Additional Equipment:   Intra-op Plan:   Post-operative Plan:   Informed Consent: I have reviewed the patients History and Physical, chart, labs and discussed the procedure including the risks, benefits and alternatives for the proposed anesthesia with the patient or authorized representative  who has indicated his/her understanding and acceptance.     Plan Discussed with: Anesthesiologist, CRNA and Surgeon  Anesthesia Plan Comments:        Anesthesia Quick Evaluation

## 2011-05-24 NOTE — Progress Notes (Signed)
ANTIBIOTIC CONSULT NOTE - INITIAL  Pharmacy Consult for Merrem (continuing Vanc; stopping Cipro) Indication: R thigh AVG infection with h/o MRSA; Klebsiella- sensitive to Merrem  Allergies  Allergen Reactions  . Aspirin     Unknown    . Heparin     SHE is HIT neg; platelets have been normal on low dose heparin at out pt dialysis ; not clear why this is listed   . Minoxidil     Unknown    . Penicillins     Unknown      Patient Measurements: Height: 5' 6.5" (168.9 cm) Weight: 146 lb 12.8 oz (66.588 kg) IBW/kg (Calculated) : 60.45  Adjusted Body Weight:   Vital Signs: Temp: 98.6 F (37 C) (03/14 1430) Temp src: Oral (03/14 1430) BP: 142/52 mmHg (03/14 1430) Pulse Rate: 57  (03/14 1430) Intake/Output from previous day: 03/13 0701 - 03/14 0700 In: 630 [I.V.:80; IV Piggyback:550] Out: -  Intake/Output from this shift: Total I/O In: 702 [I.V.:150; Blood:552] Out: 150 [Blood:150]  Labs:  [facility] 05/24/11 0620 05/23/11 0931  WBC 3.1* --  HGB 11.2* 12.9  PLT 225 --  LABCREA -- --  CREATININE 6.35* --   Estimated Creatinine Clearance: 6.9 ml/min (by C-G formula based on Cr of 6.35). No results found for this basename: VANCOTROUGH:2,VANCOPEAK:2,VANCORANDOM:2,GENTTROUGH:2,GENTPEAK:2,GENTRANDOM:2,TOBRATROUGH:2,TOBRAPEAK:2,TOBRARND:2,AMIKACINPEAK:2,AMIKACINTROU:2,AMIKACIN:2, in the last 72 hours   Microbiology: Recent Results (from the past 720 hour(s))  SURGICAL PCR SCREEN     Status: Normal   Collection Time   05/14/11  6:41 AM      Component Value Range Status Comment   MRSA, PCR NEGATIVE  NEGATIVE  Final    Staphylococcus aureus NEGATIVE  NEGATIVE  Final    *Spectra labs (to be added to shadow chart): Klebsiella-sensitive to merrem; intermediate to levaquin per Dr. Arta Silence  Medical History: Past Medical History  Diagnosis Date  . Atrial fibrillation   . CAD (coronary artery disease)   . Hypertension   . Anemia   . MRSA bacteremia     hx  . Cellulitis   .  GERD (gastroesophageal reflux disease)   . Acetabulum fracture   . Contracture of elbow     L arm  . Dementia   . Diabetes mellitus     on Insulin  . Decubitus ulcer, buttock 02/07/2011    Pt presents with dressing to R buttocks/coccyx.  . Pneumonia   . Blood transfusion   . Stroke ~ 1990's    Left side paralysis.  Left  foot drop., L arm contracted.  . Chronic kidney disease 03/13/11    dialysis at [facility]; TTS,last tx 03/12/11 hemo x 20years    Medications:  Prescriptions prior to admission  Medication Sig Dispense Refill  . amLODipine (NORVASC) 5 MG tablet Take 5 mg by mouth at bedtime.      . calcium acetate (PHOSLO) 667 MG capsule Take 667 mg by mouth 3 (three) times daily with meals.       . darbepoetin (ARANESP) 200 MCG/0.4ML SOLN Inject 200 mcg into the skin every 7 (seven) days. Takes on Thursdays.      Marland Kitchen dextromethorphan-guaiFENesin (ROBITUSSIN-DM) 10-100 MG/5ML liquid Take 10 mLs by mouth every 6 (six) hours as needed. For cough      . docusate sodium (COLACE) 100 MG capsule Take 100 mg by mouth 2 (two) times daily.       . DULoxetine (CYMBALTA) 20 MG capsule Take 20 mg by mouth daily.      . feeding supplement (PRO-STAT SUGAR  FREE 64) LIQD Take 30 mLs by mouth 2 (two) times daily with a meal.       . insulin glargine (LANTUS) 100 UNIT/ML injection Inject 5 Units into the skin at bedtime.       . lidocaine-prilocaine (EMLA) cream Apply 1 application topically 3 (three) times a week. Tues, thurs, and sat 30 mins before dialysis      . metoprolol tartrate (LOPRESSOR) 25 MG tablet Take 25-50 mg by mouth See admin instructions. 1 tab twice daily on Sun, Mon., Wed., Fri.; 1 tab daily on Tues, Thurs, and Sat      . multivitamin (RENA-VIT) TABS tablet Take 1 tablet by mouth at bedtime.  30 tablet  5  . Nutritional Supplements (FEEDING SUPPLEMENT, NEPRO CARB STEADY,) LIQD Take 237 mLs by mouth 3 (three) times daily with meals.      Marland Kitchen omeprazole (PRILOSEC) 20 MG capsule Take 20  mg by mouth daily.       Marland Kitchen oxyCODONE-acetaminophen (PERCOCET) 5-325 MG per tablet Take 1-2 tablets by mouth every 4 (four) hours as needed. For pain      . paricalcitol (ZEMPLAR) 5 MCG/ML injection Inject 0.2 mLs (1 mcg total) into the vein 3 (three) times a week.  1 mL    . risperiDONE (RISPERDAL) 0.25 MG tablet Take 0.25 mg by mouth at bedtime.       . simvastatin (ZOCOR) 20 MG tablet Take 20 mg by mouth at bedtime.       Marland Kitchen warfarin (COUMADIN) 5 MG tablet Take 5 mg by mouth daily.      . bisacodyl (DULCOLAX) 5 MG EC tablet Take 10 mg by mouth daily as needed. For constipation      . calcium carbonate, dosed in mg elemental calcium, 1250 MG/5ML Take 500 mg of elemental calcium by mouth every 6 (six) hours as needed. For acid reflux       Assessment: 78 y/o resident of [facility] with ESRD currently in OR for dbridement of a right groin wound and graft removal now with Klebsiella from outpatient wound culture sensitive to merrem and indeterminate to Levaquin per Dr. Arta Silence (lab report should be coming to shadow chart) to add Merrem and continue Vancomycin.  For HD today post-surgery. Normal HD schedule T-Th-S.  Pencillin allergy noted- patient was on Fortaz prior to admit.   Goal of Therapy:  Vanco Level 20-25 pre-HD before HD or <20  Post-HD Clinical resolution of infection.   Plan:  1. Merrem 500mg  IV q24h- post HD on HD days.  2. Continue Vancomycin 750mg  IV post-HD T-Th-S.  3. Will follow-up HD toleration/schedule.   Link Snuffer, PharmD, BCPS Clinical Pharmacist [phone] 05/24/2011,4:55 PM

## 2011-05-24 NOTE — Progress Notes (Signed)
Alfonso Ramus, RN spoke with Dr. Imogene Burn for approval of diatek catheter usage. 10 mL's aspirated from catheter and wasted.

## 2011-05-25 ENCOUNTER — Inpatient Hospital Stay (HOSPITAL_COMMUNITY): Payer: Medicare Other

## 2011-05-25 ENCOUNTER — Ambulatory Visit: Payer: Medicare Other | Admitting: Vascular Surgery

## 2011-05-25 LAB — CBC
HCT: 31.1 % — ABNORMAL LOW (ref 36.0–46.0)
MCH: 27.2 pg (ref 26.0–34.0)
MCV: 90.9 fL (ref 78.0–100.0)
RDW: 19.7 % — ABNORMAL HIGH (ref 11.5–15.5)
WBC: 5.1 10*3/uL (ref 4.0–10.5)

## 2011-05-25 LAB — GLUCOSE, CAPILLARY
Glucose-Capillary: 231 mg/dL — ABNORMAL HIGH (ref 70–99)
Glucose-Capillary: 221 mg/dL — ABNORMAL HIGH (ref 70–99)

## 2011-05-25 LAB — PREPARE FRESH FROZEN PLASMA: Unit division: 0

## 2011-05-25 LAB — BASIC METABOLIC PANEL
BUN: 30 mg/dL — ABNORMAL HIGH (ref 6–23)
CO2: 27 mEq/L (ref 19–32)
Chloride: 98 mEq/L (ref 96–112)
Creatinine, Ser: 7.37 mg/dL — ABNORMAL HIGH (ref 0.50–1.10)
GFR calc Af Amer: 5 mL/min — ABNORMAL LOW (ref >90)

## 2011-05-25 MED ORDER — INSULIN ASPART 100 UNIT/ML ~~LOC~~ SOLN
0.0000 [IU] | Freq: Three times a day (TID) | SUBCUTANEOUS | Status: DC
  Administered 2011-05-25 – 2011-05-26 (×2): 5 [IU] via SUBCUTANEOUS
  Administered 2011-05-26: 3 [IU] via SUBCUTANEOUS
  Administered 2011-05-27: 8 [IU] via SUBCUTANEOUS
  Administered 2011-05-27: 5 [IU] via SUBCUTANEOUS
  Administered 2011-05-27: 2 [IU] via SUBCUTANEOUS
  Administered 2011-05-28: 3 [IU] via SUBCUTANEOUS
  Administered 2011-05-28: 5 [IU] via SUBCUTANEOUS
  Administered 2011-05-29: 3 [IU] via SUBCUTANEOUS
  Administered 2011-05-29: 5 [IU] via SUBCUTANEOUS
  Administered 2011-05-30: 2 [IU] via SUBCUTANEOUS
  Administered 2011-05-30: 3 [IU] via SUBCUTANEOUS
  Administered 2011-05-30: 2 [IU] via SUBCUTANEOUS
  Administered 2011-05-31 (×2): 3 [IU] via SUBCUTANEOUS
  Administered 2011-06-01: 2 [IU] via SUBCUTANEOUS
  Administered 2011-06-01: 3 [IU] via SUBCUTANEOUS

## 2011-05-25 MED ORDER — WARFARIN - PHARMACIST DOSING INPATIENT: Freq: Every day | Status: DC

## 2011-05-25 MED ORDER — HEPARIN (PORCINE) IN NACL 100-0.45 UNIT/ML-% IJ SOLN
900.0000 [IU]/h | INTRAMUSCULAR | Status: DC
  Administered 2011-05-26: 900 [IU]/h via INTRAVENOUS
  Filled 2011-05-25: qty 250

## 2011-05-25 MED ORDER — PARICALCITOL 5 MCG/ML IV SOLN
INTRAVENOUS | Status: AC
  Administered 2011-05-25: 2 ug via INTRAVENOUS
  Filled 2011-05-25: qty 1

## 2011-05-25 MED ORDER — INSULIN ASPART 100 UNIT/ML ~~LOC~~ SOLN
0.0000 [IU] | Freq: Every day | SUBCUTANEOUS | Status: DC
  Administered 2011-05-26 – 2011-05-30 (×3): 2 [IU] via SUBCUTANEOUS

## 2011-05-25 MED ORDER — DARBEPOETIN ALFA-POLYSORBATE 150 MCG/0.3ML IJ SOLN
INTRAMUSCULAR | Status: AC
  Administered 2011-05-25: 150 ug via INTRAVENOUS
  Filled 2011-05-25: qty 0.3

## 2011-05-25 MED ORDER — INSULIN ASPART 100 UNIT/ML ~~LOC~~ SOLN: 0.0000 [IU] | Freq: Three times a day (TID) | SUBCUTANEOUS | Status: DC

## 2011-05-25 MED ORDER — WARFARIN SODIUM 5 MG PO TABS
5.0000 mg | ORAL_TABLET | Freq: Every day | ORAL | Status: DC
  Administered 2011-05-26: 5 mg via ORAL
  Filled 2011-05-25 (×2): qty 1

## 2011-05-25 MED ORDER — HEPARIN SODIUM (PORCINE) 1000 UNIT/ML DIALYSIS
20.0000 [IU]/kg | INTRAMUSCULAR | Status: DC | PRN
  Administered 2011-05-26: 1300 [IU] via INTRAVENOUS_CENTRAL
  Filled 2011-05-25: qty 2

## 2011-05-25 NOTE — Progress Notes (Addendum)
CSW received referral for pt admitted from SNF. Will anticipate d/c possibly Monday, based on conversation with RN. CSW will follow.  Note pt now has wound vac- facility will need to order equipment to accomodate this if pt is to d/c with wound vac.   Baxter Flattery, MSW [phone] For Dede Query

## 2011-05-25 NOTE — Progress Notes (Signed)
Subjective:  Had surgery yest with removal of grossly infected thigh AVG. Cultures sent. EBL 300 cc, fem vein was difficult to mobilize due to severe scarring. Patient without complaints. HD this am , 1700 off.  Objective:    Vital signs in last 24 hours: Filed Vitals:   05/25/11 0630 05/25/11 0635 05/25/11 0700 05/25/11 0730  BP: 79/37 99/44 91/49  122/56  Pulse: 70 80 75 85  Temp:    98.1 F (36.7 C)  TempSrc:    Oral  Resp: 30 24 24 26   Height:      Weight:    66.6 kg (146 lb 13.2 oz)  SpO2:    100%   Weight change: 0.288 kg (10.2 oz)  Intake/Output Summary (Last 24 hours) at 05/25/11 1052 Last data filed at 05/25/11 0900  Gross per 24 hour  Intake   1389 ml  Output   1350 ml  Net     39 ml   Labs: Basic Metabolic Panel:  Lab 05/25/11 8295 05/24/11 0620 05/23/11 0931  NA 139 139 140  K 4.5 4.0 4.2  CL 98 99 --  CO2 27 26 --  GLUCOSE 202* 163* 104*  BUN 30* 27* --  CREATININE 7.37* 6.35* --  ALB -- -- --  CALCIUM 8.6 9.1 --  PHOS -- 3.9 --   Liver Function Tests:  Lab 05/24/11 0620  AST --  ALT --  ALKPHOS --  BILITOT --  PROT --  ALBUMIN 2.4*   No results found for this basename: LIPASE:3,AMYLASE:3 in the last 168 hours No results found for this basename: AMMONIA:3 in the last 168 hours CBC:  Lab 05/25/11 0337 05/24/11 0620 05/23/11 0931  WBC 5.1 3.1* --  NEUTROABS -- -- --  HGB 9.3* 11.2* 12.9  HCT 31.1* 37.5 38.0  MCV 90.9 89.3 --  PLT 237 225 --   Cardiac Enzymes: No results found for this basename: CKTOTAL:5,CKMB:5,CKMBINDEX:5,TROPONINI:5 in the last 168 hours CBG:  Lab 05/24/11 2118 05/24/11 1922 05/24/11 1509 05/24/11 1123 05/24/11 0702  GLUCAP 167* 114* 104* 138* 155*    Iron Studies: No results found for this basename: IRON:30,TIBC:30,SATURATION RATIOS:30,TRANSFERRIN:30,FERRITIN:30 in the last 168 hours Studies/Results: No results found. Medications:      . calcium acetate  667 mg Oral TID WC  . darbepoetin (ARANESP) injection -  DIALYSIS  150 mcg Intravenous Q Thu-HD  . feeding supplement (NEPRO CARB STEADY)  237 mL Oral TID WC  . ferric gluconate (FERRLECIT/NULECIT) IV  125 mg Intravenous Q T,Th,Sa-HD  . meropenem (MERREM) IV  500 mg Intravenous Q24H  . multivitamin  1 tablet Oral QHS  . paricalcitol  2 mcg Intravenous 3 times weekly  . vancomycin  750 mg Intravenous Q T,Th,Sa-HD  . DISCONTD: ciprofloxacin  500 mg Oral q1800    I  have reviewed scheduled and prn medications.  Physical Exam:  Blood pressure 122/56, pulse 85, temperature 98.1 F (36.7 C), temperature source Oral, resp. rate 26, height 5' 6.5" (1.689 m), weight 66.6 kg (146 lb 13.2 oz), SpO2 100.00%.  General: stable, alert in no distress Neck: Supple. JVD not elevated.  Lungs: Clear bilaterally to auscultation without wheezes, rales, or rhonchi. Breathing is unlabored.  Heart: RRR with S1 S2. No murmurs, rubs, or gallops appreciated.  Abdomen: Soft, non-tender Groin: right groin dressing in place Lower extremities left without edema, right LE chronic swelling with groin wound; chronic left upper extremity contracture  Neuro: Alert and oriented X 3; baseline  Psych: Responds to questions  appropriately with a normal affect.  Dialysis Access: active right IJ HD cath  Dialysis Orders: Center: [facility] on TTS EDW 66 HD Bath 2K, 2.25 Ca Time 4 hr Heparin 1800 plus 500 prn. Access right I- J cath BFR 400 DFR 800 Zemplar IV/HD Epogen 21,000 Units IV/HD Infed 100 x 3 more doses then 50 / week; ;NOTE HEPARIN IS LISTED AS AN ALLERGY (hit+), BUT GIVEN AT THE DIALYSIS CENTER WHERE IT IS ALSO LISTED AS AN ALLERGY   Assessment/Recommendations 1. S/P removal infected R thigh AVG- outpatient wound culture sent recently from HD unit grew Klebs pneumoniae, indeterminate to levaquin, sensitive to mero, erta, gent, tigacil and cefipime. D/C'd cipro, on vanc and meropenem.   2. ESRD-TTS: HD tomorrow. Using R IJ TDC. 3 Hypertension/volume - volume control and meds -  on norvasc and metoprolol as at SNF 4. Anemia - Hgb today likely false elevation due to istat; Hgb 10.7 3/07; continue ESA and IV Fe  5. Metabolic bone disease - Continue zemplar 2  6. Nutrition -renal diet + supplements  7. A fib/Chronic coumadin - pharmacy dosing coumadin; vit K given to correct INR pre surgery  8. DM - SSI  9. Thrombocytopenia - intermittent - seems to be related to infx; HIT neg; check CBC with am labs; plts 186 K 2/228  10. Hx CVA w/ left contractures  11. Hx removal infected LUA AVG Mar 2012 (MRSA) and removal of infected R forearm AVG Nov 2012  [name]  MD [facility] [phone] pgr    [phone] cell 05/25/2011, 10:52 AM

## 2011-05-25 NOTE — Plan of Care (Signed)
Problem: Phase I Progression Outcomes Goal: Initial discharge plan identified Outcome: Completed/Met Date Met:  05/25/11 [name]

## 2011-05-25 NOTE — Progress Notes (Signed)
[facility]  Daily Progress Note  Assessment/Planning: POD #1 s/p L thigh AVG excision   Cont VAC to R groin  Start backing out Kerlix from tunnel tomorrow  Start Heparin gtt tomorrow  Cont Vanco and Cipro until C&S results available  Subjective  - 1 Day Post-Op  No complaints, pain tolerable  Objective Filed Vitals:   05/25/11 0630 05/25/11 0635 05/25/11 0700 05/25/11 0730  BP: 79/37 99/44 91/49  122/56  Pulse: 70 80 75 85  Temp:    98.1 F (36.7 C)  TempSrc:    Oral  Resp: 30 24 24 26   Height:      Weight:    146 lb 13.2 oz (66.6 kg)  SpO2:    100%    Intake/Output Summary (Last 24 hours) at 05/25/11 1191 Last data filed at 05/25/11 0730  Gross per 24 hour  Intake   1269 ml  Output   1050 ml  Net    219 ml    PULM  CTAB CV  RRR GI  soft, NTND VASC  R groin VAC adherent, R thigh bandaged without evidence of active bleeding on bandages, R foot with faintly palp DP  Laboratory CBC    Component Value Date/Time   WBC 5.1 05/25/2011 0337   HGB 9.3* 05/25/2011 0337   HCT 31.1* 05/25/2011 0337   PLT 237 05/25/2011 0337    BMET    Component Value Date/Time   NA 139 05/25/2011 0337   K 4.5 05/25/2011 0337   CL 98 05/25/2011 0337   CO2 27 05/25/2011 0337   GLUCOSE 202* 05/25/2011 0337   BUN 30* 05/25/2011 0337   CREATININE 7.37* 05/25/2011 0337   CALCIUM 8.6 05/25/2011 0337   CALCIUM 8.6 07/07/2009 1725   GFRNONAA 5* 05/25/2011 0337   GFRAA 5* 05/25/2011 4782    Leonides Sake, MD [facility] Office: [phone] Pager: [phone]  05/25/2011, 8:22 AM

## 2011-05-25 NOTE — Progress Notes (Signed)
Utilization review completed. Bich Mchaney, RN, BSN.  05/25/11  

## 2011-05-25 NOTE — Progress Notes (Signed)
ANTICOAGULATION CONSULT NOTE - Initial Consult  Pharmacy Consult for Heparin, Coumadin Indication: History of DVT, stroke  Allergies  Allergen Reactions  . Aspirin     Unknown    . Heparin     SHE is HIT neg; platelets have been normal on low dose heparin at out pt dialysis ; not clear why this is listed   . Minoxidil     Unknown    . Penicillins     Unknown      Patient Measurements: Height: 5' 6.5" (168.9 cm) Weight: 146 lb 13.2 oz (66.6 kg) IBW/kg (Calculated) : 60.45  Heparin Dosing Weight:   Vital Signs: Temp: 101.3 F (38.5 C) (03/15 1400) Temp src: Oral (03/15 1400) BP: 120/67 mmHg (03/15 1428) Pulse Rate: 84  (03/15 1428)  Labs:  Basename 05/25/11 0337 05/24/11 0620 05/23/11 0931 05/23/11 0915  HGB 9.3* 11.2* -- --  HCT 31.1* 37.5 38.0 --  PLT 237 225 -- --  APTT -- -- -- 69*  LABPROT -- 20.1* -- 26.2*  INR -- 1.68* -- 2.36*  HEPARINUNFRC -- -- -- --  CREATININE 7.37* 6.35* -- --  CKTOTAL -- -- -- --  CKMB -- -- -- --  TROPONINI -- -- -- --   Estimated Creatinine Clearance: 5.9 ml/min (by C-G formula based on Cr of 7.37).  Medical History: Past Medical History  Diagnosis Date  . Atrial fibrillation   . CAD (coronary artery disease)   . Hypertension   . Anemia   . MRSA bacteremia     hx  . Cellulitis   . GERD (gastroesophageal reflux disease)   . Acetabulum fracture   . Contracture of elbow     L arm  . Dementia   . Diabetes mellitus     on Insulin  . Decubitus ulcer, buttock 02/07/2011    Pt presents with dressing to R buttocks/coccyx.  . Pneumonia   . Blood transfusion   . Stroke ~ 1990's    Left side paralysis.  Left  foot drop., L arm contracted.  . Chronic kidney disease 03/13/11    dialysis at [facility]; TTS,last tx 03/12/11 hemo x 20years    Medications:  Scheduled:    . calcium acetate  667 mg Oral TID WC  . darbepoetin (ARANESP) injection - DIALYSIS  150 mcg Intravenous Q Thu-HD  . feeding supplement (NEPRO CARB  STEADY)  237 mL Oral TID WC  . ferric gluconate (FERRLECIT/NULECIT) IV  125 mg Intravenous Q T,Th,Sa-HD  . insulin aspart  0-15 Units Subcutaneous TID WC  . insulin aspart  0-5 Units Subcutaneous QHS  . meropenem (MERREM) IV  500 mg Intravenous Q24H  . multivitamin  1 tablet Oral QHS  . paricalcitol  2 mcg Intravenous 3 times weekly  . vancomycin  750 mg Intravenous Q T,Th,Sa-HD  . DISCONTD: insulin aspart  0-15 Units Subcutaneous TID WC    Assessment: Pt is post op AVG graft removal. She is to start heparin and restart Coumadin on 3/16@1200AM .  Goal of Therapy:  Heparin level 0.3-0.7 units/ml, INR 2.0-3.0   Plan:  Will start heparin drip at 900 units/hr with no bolus and check a heparin level in 8 hrs then daily. Will restart coumadin at home dose of 5 mg daily. Will check daily INR.  Eugene Garnet 05/25/2011,7:03 PM

## 2011-05-25 NOTE — Progress Notes (Signed)
Pt took off ace bandage on right leg. Pt assessed as confused. Rn replaced 2 4x4 and ace bandage as ordered prn by MD.  Leonie Green

## 2011-05-25 NOTE — Progress Notes (Signed)
Inpatient Diabetes Program Recommendations  AACE/ADA: New Consensus Statement on Inpatient Glycemic Control (2009)  Target Ranges:  Prepandial:   less than 140 mg/dL      Peak postprandial:   less than 180 mg/dL (1-2 hours)      Critically ill patients:  140 - 180 mg/dL   Reason for Visit: Results for Ebony [name], Ebony [name] (MRN [id number]) as of 05/25/2011 14:08  Ref. Range 05/24/2011 21:18 05/25/2011 11:19  Glucose-Capillary Latest Range: 70-99 mg/dL 409 (H) 811 (H)    Inpatient Diabetes Program Recommendations Correction (SSI): Please add Novolog sensitive scale tid with meals.  Note: Will follow.

## 2011-05-26 ENCOUNTER — Inpatient Hospital Stay (HOSPITAL_COMMUNITY): Payer: Medicare Other

## 2011-05-26 LAB — CBC
HCT: 31 % — ABNORMAL LOW (ref 36.0–46.0)
Hemoglobin: 9 g/dL — ABNORMAL LOW (ref 12.0–15.0)
MCH: 26.6 pg (ref 26.0–34.0)
MCHC: 29 g/dL — ABNORMAL LOW (ref 30.0–36.0)
MCV: 91.7 fL (ref 78.0–100.0)

## 2011-05-26 LAB — GLUCOSE, CAPILLARY
Glucose-Capillary: 206 mg/dL — ABNORMAL HIGH (ref 70–99)
Glucose-Capillary: 163 mg/dL — ABNORMAL HIGH (ref 70–99)
Glucose-Capillary: 206 mg/dL — ABNORMAL HIGH (ref 70–99)

## 2011-05-26 LAB — HEPARIN LEVEL (UNFRACTIONATED): Heparin Unfractionated: 0.25 IU/mL — ABNORMAL LOW (ref 0.30–0.70)

## 2011-05-26 MED ORDER — HEPARIN (PORCINE) IN NACL 100-0.45 UNIT/ML-% IJ SOLN
1200.0000 [IU]/h | INTRAMUSCULAR | Status: DC
  Administered 2011-05-26: 1200 [IU]/h via INTRAVENOUS
  Filled 2011-05-26: qty 250

## 2011-05-26 MED ORDER — HEPARIN (PORCINE) IN NACL 100-0.45 UNIT/ML-% IJ SOLN
1000.0000 [IU]/h | INTRAMUSCULAR | Status: DC
  Filled 2011-05-26: qty 250

## 2011-05-26 NOTE — Progress Notes (Signed)
ANTICOAGULATION CONSULT NOTE - Follow Up Consult  Pharmacy Consult for heparin Indication: h/o afib  Allergies  Allergen Reactions  . Aspirin     Unknown    . Heparin     SHE is HIT neg; platelets have been normal on low dose heparin at out pt dialysis ; not clear why this is listed   . Minoxidil     Unknown    . Penicillins     Unknown      Patient Measurements: Height: 5' 6.5" (168.9 cm) Weight: 146 lb 13.2 oz (66.6 kg) IBW/kg (Calculated) : 60.45  Heparin Dosing Weight: 66.6 kg  Vital Signs: Temp: 98.6 F (37 C) (03/16 0517) Temp src: Oral (03/16 0517) BP: 107/59 mmHg (03/16 0517) Pulse Rate: 64  (03/16 0517)  Labs:  Basename 05/26/11 0940 05/25/11 0337 05/24/11 0620  HGB 9.0* 9.3* --  HCT 31.0* 31.1* 37.5  PLT 179 237 225  APTT -- -- --  LABPROT -- -- 20.1*  INR -- -- 1.68*  HEPARINUNFRC 0.28* -- --  CREATININE -- 7.37* 6.35*  CKTOTAL -- -- --  CKMB -- -- --  TROPONINI -- -- --   Estimated Creatinine Clearance: 5.9 ml/min (by C-G formula based on Cr of 7.37).   Medications:  Scheduled:    . calcium acetate  667 mg Oral TID WC  . darbepoetin (ARANESP) injection - DIALYSIS  150 mcg Intravenous Q Thu-HD  . feeding supplement (NEPRO CARB STEADY)  237 mL Oral TID WC  . ferric gluconate (FERRLECIT/NULECIT) IV  125 mg Intravenous Q T,Th,Sa-HD  . insulin aspart  0-15 Units Subcutaneous TID WC  . insulin aspart  0-5 Units Subcutaneous QHS  . meropenem (MERREM) IV  500 mg Intravenous Q24H  . multivitamin  1 tablet Oral QHS  . paricalcitol  2 mcg Intravenous 3 times weekly  . vancomycin  750 mg Intravenous Q T,Th,Sa-HD  . warfarin  5 mg Oral q1800  . Warfarin - Pharmacist Dosing Inpatient   Does not apply q1800  . DISCONTD: insulin aspart  0-15 Units Subcutaneous TID WC   Infusions:    . heparin 900 Units/hr (05/26/11 0007)    Assessment: Pt is post op AVG graft removal. Started on heparin and warfarin overnight.  Currently heparin level slightly  less than goal.  INR unchanged (dose not given last night, per policy; to start this PM) No bleeding noted.  Plt slightly decreased, will follow.  Goal of Therapy:  INR 2-3 Heparin level 0.3-0.7 units/ml   Plan:  1. Increase heparin slightly to 1000 units/hr 2. F/u 8h heparin level 3. Give warfarin 5 mg po x1 tonight, as already ordered. 4. Daily heparin level, CBC, INR.  Mariabelen Pressly L. Illene Bolus, PharmD, BCPS Clinical Pharmacist Pager: [phone] 05/26/2011 11:24 AM

## 2011-05-26 NOTE — Progress Notes (Signed)
ANTICOAGULATION CONSULT NOTE - Follow Up Consult  Pharmacy Consult for heparin Indication: h/o afib  Allergies  Allergen Reactions  . Aspirin     Unknown    . Heparin     SHE is HIT neg; platelets have been normal on low dose heparin at out pt dialysis ; not clear why this is listed   . Minoxidil     Unknown    . Penicillins     Unknown      Patient Measurements: Height: 5' 6.5" (168.9 cm) Weight: 146 lb 13.2 oz (66.6 kg) IBW/kg (Calculated) : 60.45  Heparin Dosing Weight: 66.6 kg  Vital Signs: Temp: 99.7 F (37.6 C) (03/16 2006) Temp src: Oral (03/16 2006) BP: 111/61 mmHg (03/16 2006) Pulse Rate: 76  (03/16 2006)  Labs:  Basename 05/26/11 1951 05/26/11 0940 05/25/11 0337 05/24/11 0620  HGB -- 9.0* 9.3* --  HCT -- 31.0* 31.1* 37.5  PLT -- 179 237 225  APTT -- -- -- --  LABPROT -- -- -- 20.1*  INR -- -- -- 1.68*  HEPARINUNFRC 0.25* 0.28* -- --  CREATININE -- -- 7.37* 6.35*  CKTOTAL -- -- -- --  CKMB -- -- -- --  TROPONINI -- -- -- --   Estimated Creatinine Clearance: 5.9 ml/min (by C-G formula based on Cr of 7.37).   Medications:  Scheduled:     . calcium acetate  667 mg Oral TID WC  . darbepoetin (ARANESP) injection - DIALYSIS  150 mcg Intravenous Q Thu-HD  . feeding supplement (NEPRO CARB STEADY)  237 mL Oral TID WC  . ferric gluconate (FERRLECIT/NULECIT) IV  125 mg Intravenous Q T,Th,Sa-HD  . insulin aspart  0-15 Units Subcutaneous TID WC  . insulin aspart  0-5 Units Subcutaneous QHS  . meropenem (MERREM) IV  500 mg Intravenous Q24H  . multivitamin  1 tablet Oral QHS  . paricalcitol  2 mcg Intravenous 3 times weekly  . vancomycin  750 mg Intravenous Q T,Th,Sa-HD  . warfarin  5 mg Oral q1800  . Warfarin - Pharmacist Dosing Inpatient   Does not apply q1800   Infusions:     . heparin    . DISCONTD: heparin 900 Units/hr (05/26/11 0007)    Assessment: Pt is post op AVG graft removal. Started on heparin and warfarin overnight.  Heparin level is  still below goal after increase earlier today (HL=0.25 on 1000 units/hr)  Goal of Therapy:  INR 2-3 Heparin level 0.3-0.7 units/ml   Plan:  -Increase heparin to 1200 units/hr -Check a heparin level in am.  Harland German, Pharm D 05/26/2011 10:14 PM

## 2011-05-26 NOTE — Progress Notes (Addendum)
  VASCULAR AND VEIN SURGERY PROGRESS NOTE  Progress note  Date of Surgery: 05/23/2011 - 05/24/2011 Surgeon: Moishe Spice): Fransisco Hertz, MD 2 Days Post-Op Right Procedure(s): REMOVAL OF ARTERIOVENOUS GORETEX GRAFT (AVGG) APPLICATION OF WOUND VAC   HPI: Ebony [name] is a 78 y.o. female Patient is a 78 y.o. year old female who presents for evaluation of right leg swelling. She has a known right thigh graft with recent angioplasty of the venous outflow in January. She has some dementia but states that the swelling has been present for a few days. The graft has been working. She has some tenderness over the right ankle. She denies swelling in this leg previously. Other medical problems include a fib for which she is on coumadin, CAD, Anemia, Diabetes. Chronic hemodialysis T Th Sat [facility]. She lives at [facility]     Significant Diagnostic Studies: CBC    Component Value Date/Time   WBC 5.1 05/25/2011 0337   RBC 3.42* 05/25/2011 0337   HGB 9.3* 05/25/2011 0337   HCT 31.1* 05/25/2011 0337   PLT 237 05/25/2011 0337   MCV 90.9 05/25/2011 0337   MCH 27.2 05/25/2011 0337   MCHC 29.9* 05/25/2011 0337   RDW 19.7* 05/25/2011 0337   LYMPHSABS 1.3 04/20/2011 1420   MONOABS 0.7 04/20/2011 1420   EOSABS 0.1 04/20/2011 1420   BASOSABS 0.0 04/20/2011 1420    BMET    Component Value Date/Time   NA 139 05/25/2011 0337   K 4.5 05/25/2011 0337   CL 98 05/25/2011 0337   CO2 27 05/25/2011 0337   GLUCOSE 202* 05/25/2011 0337   BUN 30* 05/25/2011 0337   CREATININE 7.37* 05/25/2011 0337   CALCIUM 8.6 05/25/2011 0337   CALCIUM 8.6 07/07/2009 1725   GFRNONAA 5* 05/25/2011 0337   GFRAA 5* 05/25/2011 0337    COAG Lab Results  Component Value Date   INR 1.68* 05/24/2011   INR 2.36* 05/23/2011   INR 1.10 05/14/2011   No results found for this basename: PTT     I/O last 3 completed shifts: In: 793 [P.O.:730; I.V.:63] Out: 1000 [Urine:300; Other:700]  Physical Examination  Patient Vitals for the past 24  hrs:  BP Temp Temp src Pulse Resp SpO2  05/26/11 0517 107/59 mmHg 98.6 F (37 C) Oral 64  19  96 %  05/25/11 2153 90/50 mmHg 100.6 F (38.1 C) Oral 75  18  96 %  05/25/11 1428 120/67 mmHg - - 84  - 90 %  05/25/11 1400 104/63 mmHg 101.3 F (38.5 C) Oral 82  18  91 %    Pt Incision is clean, dry, intact or healing well, skin color is normal, no cyanosis, jaundice, pallor or bruising, normal at the site of staples and around the anterior thigh packing.  Removed the keflex about 1/3 and redressed.  Wound vac in place and working properly.  Assessment/Plan  Ebony [name] is a 78 y.o. year old female who is S/P Procedure(s):  REMOVAL OF ARTERIOVENOUS GORETEX GRAFT (AVGG)  APPLICATION OF WOUND VAC  [name], Ebony MAUREEN 05/26/2011 8:00 AM   I have examined the patient, reviewed and agree with above. Start heparin drip Ebony Calica, MD 05/26/2011 10:40 AM

## 2011-05-26 NOTE — Progress Notes (Signed)
Subjective:  No complaints.   Objective:    Vital signs in last 24 hours: Filed Vitals:   05/25/11 1400 05/25/11 1428 05/25/11 2153 05/26/11 0517  BP: 104/63 120/67 90/50 107/59  Pulse: 82 84 75 64  Temp: 101.3 F (38.5 C)  100.6 F (38.1 C) 98.6 F (37 C)  TempSrc: Oral  Oral Oral  Resp: 18  18 19   Height:      Weight:      SpO2: 91% 90% 96% 96%   Weight change: 0.012 kg (0.4 oz)  Intake/Output Summary (Last 24 hours) at 05/26/11 1025 Last data filed at 05/26/11 0648  Gross per 24 hour  Intake    673 ml  Output      0 ml  Net    673 ml   Labs: Basic Metabolic Panel:  Lab 05/25/11 1191 05/24/11 0620 05/23/11 0931  NA 139 139 140  K 4.5 4.0 4.2  CL 98 99 --  CO2 27 26 --  GLUCOSE 202* 163* 104*  BUN 30* 27* --  CREATININE 7.37* 6.35* --  ALB -- -- --  CALCIUM 8.6 9.1 --  PHOS -- 3.9 --   Liver Function Tests:  Lab 05/24/11 0620  AST --  ALT --  ALKPHOS --  BILITOT --  PROT --  ALBUMIN 2.4*   No results found for this basename: LIPASE:3,AMYLASE:3 in the last 168 hours No results found for this basename: AMMONIA:3 in the last 168 hours CBC:  Lab 05/26/11 0940 05/25/11 0337 05/24/11 0620 05/23/11 0931  WBC 6.9 5.1 3.1* --  NEUTROABS -- -- -- --  HGB 9.0* 9.3* 11.2* 12.9  HCT 31.0* 31.1* 37.5 38.0  MCV 91.7 90.9 89.3 --  PLT 179 237 225 --   Cardiac Enzymes: No results found for this basename: CKTOTAL:5,CKMB:5,CKMBINDEX:5,TROPONINI:5 in the last 168 hours CBG:  Lab 05/26/11 0606 05/25/11 2146 05/25/11 1838 05/25/11 1631 05/25/11 1119  GLUCAP 177* 154* 221* 272* 231*    Iron Studies: No results found for this basename: IRON:30,TIBC:30,SATURATION RATIOS:30,TRANSFERRIN:30,FERRITIN:30 in the last 168 hours Studies/Results: No results found. Medications:    . heparin 900 Units/hr (05/26/11 0007)      . calcium acetate  667 mg Oral TID WC  . darbepoetin (ARANESP) injection - DIALYSIS  150 mcg Intravenous Q Thu-HD  . feeding supplement (NEPRO  CARB STEADY)  237 mL Oral TID WC  . ferric gluconate (FERRLECIT/NULECIT) IV  125 mg Intravenous Q T,Th,Sa-HD  . insulin aspart  0-15 Units Subcutaneous TID WC  . insulin aspart  0-5 Units Subcutaneous QHS  . meropenem (MERREM) IV  500 mg Intravenous Q24H  . multivitamin  1 tablet Oral QHS  . paricalcitol  2 mcg Intravenous 3 times weekly  . vancomycin  750 mg Intravenous Q T,Th,Sa-HD  . warfarin  5 mg Oral q1800  . Warfarin - Pharmacist Dosing Inpatient   Does not apply q1800  . DISCONTD: insulin aspart  0-15 Units Subcutaneous TID WC    I  have reviewed scheduled and prn medications.  Physical Exam:  Blood pressure 107/59, pulse 64, temperature 98.6 F (37 C), temperature source Oral, resp. rate 19, height 5' 6.5" (1.689 m), weight 66.6 kg (146 lb 13.2 oz), SpO2 96.00%.  General: stable, alert in no distress Neck: Supple. JVD not elevated.  Lungs: Clear bilat  Heart: regular, no rub Abdomen: Soft, non-tender Groin: right groin dressing in place Ext- right thigh edematous, wound vac over groin wound Neuro: Alert and oriented X 3; baseline  Psych: Responds to questions appropriately with a normal affect.  Dialysis Access: active right IJ HD cath  Dialysis Orders: Center: [facility] on TTS EDW 66 HD Bath 2K, 2.25 Ca Time 4 hr Heparin 1800 plus 500 prn. Access right I- J cath BFR 400 DFR 800 Zemplar IV/HD Epogen 21,000 Units IV/HD Infed 100 x 3 more doses then 50 / week; ;NOTE HEPARIN IS LISTED AS AN ALLERGY (hit+), BUT GIVEN AT THE DIALYSIS CENTER WHERE IT IS ALSO LISTED AS AN ALLERGY   Assessment/Recommendations 1. S/P removal infected R thigh AVG- outpatient wound culture sent recently from HD unit grew Klebs pneumoniae, indeterminate to levaquin, sensitive to mero, erta, gent, tigacil and cefipime. On IV vanc and meropenem.  Cultures here are growing few GNR's as well. Await final result.  2. ESRD-TTS: HD tomorrow. Using R IJ TDC. 3. Hypertension/volume - volume control and meds - on  norvasc and metoprolol as at SNF 4. Anemia - Hb 9's, continue ESA and IV Fe  5. Metabolic bone disease - Continue zemplar 2  6. Nutrition -renal diet + supplements  7. A fib/Chronic coumadin - pharmacy dosing coumadin; vit K given to correct INR pre surgery  8. DM - SSI  9. Thrombocytopenia - intermittent - seems to be related to infx; HIT neg; check CBC with am labs; plts 186 K 2/28. She is getting heparin with HD   10. Hx CVA w/ left contractures  11. Hx removal infected LUA AVG Mar 2012 (MRSA) and removal of infected R forearm AVG Nov 2012  [name]  MD [facility] [phone] pgr    [phone] cell 05/26/2011, 10:25 AM

## 2011-05-27 ENCOUNTER — Other Ambulatory Visit: Payer: Self-pay

## 2011-05-27 LAB — HEPARIN LEVEL (UNFRACTIONATED)
Heparin Unfractionated: 0.59 IU/mL (ref 0.30–0.70)
Heparin Unfractionated: 0.93 IU/mL — ABNORMAL HIGH (ref 0.30–0.70)

## 2011-05-27 LAB — CBC
Hemoglobin: 9.4 g/dL — ABNORMAL LOW (ref 12.0–15.0)
MCH: 26.9 pg (ref 26.0–34.0)
MCHC: 29.7 g/dL — ABNORMAL LOW (ref 30.0–36.0)
Platelets: 197 10*3/uL (ref 150–400)
RDW: 20 % — ABNORMAL HIGH (ref 11.5–15.5)

## 2011-05-27 LAB — PROTIME-INR
INR: 1.19 (ref 0.00–1.49)
Prothrombin Time: 15.4 seconds — ABNORMAL HIGH (ref 11.6–15.2)

## 2011-05-27 LAB — GLUCOSE, CAPILLARY: Glucose-Capillary: 145 mg/dL — ABNORMAL HIGH (ref 70–99)

## 2011-05-27 LAB — WOUND CULTURE

## 2011-05-27 MED ORDER — WARFARIN SODIUM 7.5 MG PO TABS
7.5000 mg | ORAL_TABLET | Freq: Once | ORAL | Status: AC
  Administered 2011-05-27: 7.5 mg via ORAL
  Filled 2011-05-27: qty 1

## 2011-05-27 MED ORDER — HEPARIN (PORCINE) IN NACL 100-0.45 UNIT/ML-% IJ SOLN
1050.0000 [IU]/h | INTRAMUSCULAR | Status: DC
  Administered 2011-05-27: 1050 [IU]/h via INTRAVENOUS
  Filled 2011-05-27 (×3): qty 250

## 2011-05-27 NOTE — Progress Notes (Signed)
ANTICOAGULATION CONSULT NOTE - Follow Up Consult  Pharmacy Consult for heparin/warfarin Indication: h/o afib  Allergies  Allergen Reactions  . Aspirin     Unknown    . Heparin     SHE is HIT neg; platelets have been normal on low dose heparin at out pt dialysis ; not clear why this is listed   . Minoxidil     Unknown    . Penicillins     Unknown      Patient Measurements: Height: 5' 6.5" (168.9 cm) Weight: 149 lb 14.6 oz (68 kg) IBW/kg (Calculated) : 60.45  Heparin Dosing Weight: 66.6 kg  Vital Signs: Temp: 99.3 F (37.4 C) (03/17 0420) Temp src: Oral (03/17 0420) BP: 108/60 mmHg (03/17 0420) Pulse Rate: 73  (03/17 0420)  Labs:  Basename 05/27/11 1352 05/27/11 0515 05/26/11 1951 05/26/11 0940 05/25/11 0337  HGB -- 9.4* -- 9.0* --  HCT -- 31.6* -- 31.0* 31.1*  PLT -- 197 -- 179 237  APTT -- -- -- -- --  LABPROT -- 15.4* -- -- --  INR -- 1.19 -- -- --  HEPARINUNFRC 0.93* 0.59 0.25* -- --  CREATININE -- -- -- -- 7.37*  CKTOTAL -- -- -- -- --  CKMB -- -- -- -- --  TROPONINI -- -- -- -- --   Estimated Creatinine Clearance: 5.9 ml/min (by C-G formula based on Cr of 7.37).   Medications:  Scheduled:     . calcium acetate  667 mg Oral TID WC  . darbepoetin (ARANESP) injection - DIALYSIS  150 mcg Intravenous Q Thu-HD  . feeding supplement (NEPRO CARB STEADY)  237 mL Oral TID WC  . ferric gluconate (FERRLECIT/NULECIT) IV  125 mg Intravenous Q T,Th,Sa-HD  . insulin aspart  0-15 Units Subcutaneous TID WC  . insulin aspart  0-5 Units Subcutaneous QHS  . meropenem (MERREM) IV  500 mg Intravenous Q24H  . multivitamin  1 tablet Oral QHS  . paricalcitol  2 mcg Intravenous 3 times weekly  . vancomycin  750 mg Intravenous Q T,Th,Sa-HD  . warfarin  7.5 mg Oral ONCE-1800  . Warfarin - Pharmacist Dosing Inpatient   Does not apply q1800  . DISCONTD: warfarin  5 mg Oral q1800   Infusions:     . heparin 1,200 Units/hr (05/26/11 2230)  . DISCONTD: heparin       Assessment: Pt is post op AVG graft removal.  Repeat heparin level great than goal with heparin running at 1200 units/hr.  Some bloody drainage noted from wound vac site at groin incision.  RNs evaluating at present.  Unclear if this is changed from previous.    Confirmed with patient that level drawn appropriately - from her hand, heparin infusing in same arm but above this site.  Home warfarin dose: 5 mg po qday lead to therapeutic INR of 2.36 on admission.  Goal of Therapy:  INR 2-3 Heparin level 0.3-0.7 units/ml   Plan:  1.  Hold heparin x 30 min (turned off at 1600). 2.  Resume heparin at decreased rate of 1050 units/hr. 3.  Check 8h heparin level after restart (0030).  Zayana Salvador L. Illene Bolus, PharmD, BCPS Clinical Pharmacist Pager: [phone] 05/27/2011 3:52 PM

## 2011-05-27 NOTE — Progress Notes (Addendum)
 [facility]  Progress Note  Date of Surgery: 05/23/2011 - 05/24/2011  Procedure(s): REMOVAL OF ARTERIOVENOUS GORETEX GRAFT (AVGG) APPLICATION OF WOUND VAC Surgeon: Surgeon(s): Fransisco Hertz, MD  3 Days Post-Op  History of Present Illness  Ebony [name] is a 78 y.o. female who is S/P Procedure(s): REMOVAL OF ARTERIOVENOUS GORETEX GRAFT (AVGG) APPLICATION OF WOUND VAC right.  The patient's pre-op symptoms  are Improved . Patients pain is well controlled.       Imaging: No results found.  Significant Diagnostic Studies: CBC Lab Results  Component Value Date   WBC 8.5 05/27/2011   HGB 9.4* 05/27/2011   HCT 31.6* 05/27/2011   MCV 90.3 05/27/2011   PLT 197 05/27/2011    BMET @LASTCHEMISTRY @     Component Value Date/Time   NA 139 05/25/2011 0337   K 4.5 05/25/2011 0337   CL 98 05/25/2011 0337   CO2 27 05/25/2011 0337   GLUCOSE 202* 05/25/2011 0337   BUN 30* 05/25/2011 0337   CREATININE 7.37* 05/25/2011 0337   CALCIUM 8.6 05/25/2011 0337   CALCIUM 8.6 07/07/2009 1725   GFRNONAA 5* 05/25/2011 0337   GFRAA 5* 05/25/2011 0337    COAG Lab Results  Component Value Date   INR 1.19 05/27/2011   INR 1.68* 05/24/2011   INR 2.36* 05/23/2011   No results found for this basename: PTT    Physical Examination  BP Readings from Last 3 Encounters:  05/27/11 108/60  05/27/11 108/60  05/22/11 136/80   Temp Readings from Last 3 Encounters:  05/27/11 99.3 F (37.4 C) Oral  05/27/11 99.3 F (37.4 C) Oral  05/14/11 97.3 F (36.3 C) Oral   SpO2 Readings from Last 3 Encounters:  05/27/11 95%  05/27/11 95%  05/14/11 92%   Pulse Readings from Last 3 Encounters:  05/27/11 73  05/27/11 73  05/22/11 62    Pt is A&O x 3 right lower extremity: Incision/s is/are clean, dry, intact or clean,dry.intact, and  clean, dry, intact or healing without hematoma, erythema or drainage Limb is warm; with good color Wound vac in place at groin incision, thigh  incision C/D/I.  Anterior distal thigh incision edges C/D/I-removed 1/4 of packing at bedside and placed clean dry dressing.     Assessment: Pt. Doing well Post-op pain is controlled Wounds are clean, dry, intact or healing well Wound vac in place and anterior thigh wound dressed S/P removal infected R thigh AVG- outpatient wound culture sent recently from HD unit grew Klebs pneumoniae, indeterminate to levaquin, sensitive to mero, erta, gent, tigacil and cefipime. On IV vanc and meropenem. Cultures here are growing few GNR's as well, final result pending.     Plan: Will discuss antibiotic changes with Dr. Arbie Cookey Continue wound care as ordered  Clinton Gallant [facility] [phone] 05/27/2011 8:48 AM        I have examined the patient, reviewed and agree with above.  , , MD 05/27/2011 10:20 AM

## 2011-05-27 NOTE — Progress Notes (Addendum)
ANTICOAGULATION CONSULT NOTE - Follow Up Consult  Pharmacy Consult for heparin/warfarin Indication: h/o afib  Allergies  Allergen Reactions  . Aspirin     Unknown    . Heparin     SHE is HIT neg; platelets have been normal on low dose heparin at out pt dialysis ; not clear why this is listed   . Minoxidil     Unknown    . Penicillins     Unknown      Patient Measurements: Height: 5' 6.5" (168.9 cm) Weight: 149 lb 14.6 oz (68 kg) IBW/kg (Calculated) : 60.45  Heparin Dosing Weight: 66.6 kg  Vital Signs: Temp: 99.3 F (37.4 C) (03/17 0420) Temp src: Oral (03/17 0420) BP: 108/60 mmHg (03/17 0420) Pulse Rate: 73  (03/17 0420)  Labs:  Basename 05/27/11 0515 05/26/11 1951 05/26/11 0940 05/25/11 0337  HGB 9.4* -- 9.0* --  HCT 31.6* -- 31.0* 31.1*  PLT 197 -- 179 237  APTT -- -- -- --  LABPROT 15.4* -- -- --  INR 1.19 -- -- --  HEPARINUNFRC 0.59 0.25* 0.28* --  CREATININE -- -- -- 7.37*  CKTOTAL -- -- -- --  CKMB -- -- -- --  TROPONINI -- -- -- --   Estimated Creatinine Clearance: 5.9 ml/min (by C-G formula based on Cr of 7.37).   Medications:  Scheduled:     . calcium acetate  667 mg Oral TID WC  . darbepoetin (ARANESP) injection - DIALYSIS  150 mcg Intravenous Q Thu-HD  . feeding supplement (NEPRO CARB STEADY)  237 mL Oral TID WC  . ferric gluconate (FERRLECIT/NULECIT) IV  125 mg Intravenous Q T,Th,Sa-HD  . insulin aspart  0-15 Units Subcutaneous TID WC  . insulin aspart  0-5 Units Subcutaneous QHS  . meropenem (MERREM) IV  500 mg Intravenous Q24H  . multivitamin  1 tablet Oral QHS  . paricalcitol  2 mcg Intravenous 3 times weekly  . vancomycin  750 mg Intravenous Q T,Th,Sa-HD  . warfarin  5 mg Oral q1800  . Warfarin - Pharmacist Dosing Inpatient   Does not apply q1800   Infusions:     . heparin 1,200 Units/hr (05/26/11 2230)  . DISCONTD: heparin      Assessment: Pt is post op AVG graft removal.  Heparin level therapeutic on current rate of 1200  units/hr.  INR below goal after one dose of warfarin last pm, as expceted. Home warfarin dose: 5 mg po qday lead to therapeutic INR of 2.36 on admission.  Goal of Therapy:  INR 2-3 Heparin level 0.3-0.7 units/ml   Plan:  1. Continue heparin at current rate. 2.  Warfarin 7.5 mg po x1 tonight. 3. Daily heparin level, CBC, INR 4. HL now to confirm at goal rate   Reem Fleury L. Illene Bolus, PharmD, BCPS Clinical Pharmacist Pager: [phone] 05/27/2011 1:20 PM

## 2011-05-27 NOTE — Progress Notes (Signed)
Subjective:  No complaints.   Objective:    Vital signs in last 24 hours: Filed Vitals:   05/27/11 0230 05/27/11 0300 05/27/11 0316 05/27/11 0420  BP: 104/52 118/42 122/48 108/60  Pulse: 71 74 62 73  Temp:   98.9 F (37.2 C) 99.3 F (37.4 C)  TempSrc:   Oral Oral  Resp: 23 30 25 21   Height:      Weight:   68 kg (149 lb 14.6 oz)   SpO2:   94% 95%   Weight change: 2.4 kg (5 lb 4.7 oz)  Intake/Output Summary (Last 24 hours) at 05/27/11 0811 Last data filed at 05/27/11 0314  Gross per 24 hour  Intake    480 ml  Output   1004 ml  Net   -524 ml   Labs: Basic Metabolic Panel:  Lab 05/25/11 8756 05/24/11 0620 05/23/11 0931  NA 139 139 140  K 4.5 4.0 4.2  CL 98 99 --  CO2 27 26 --  GLUCOSE 202* 163* 104*  BUN 30* 27* --  CREATININE 7.37* 6.35* --  ALB -- -- --  CALCIUM 8.6 9.1 --  PHOS -- 3.9 --   Liver Function Tests:  Lab 05/24/11 0620  AST --  ALT --  ALKPHOS --  BILITOT --  PROT --  ALBUMIN 2.4*   No results found for this basename: LIPASE:3,AMYLASE:3 in the last 168 hours No results found for this basename: AMMONIA:3 in the last 168 hours CBC:  Lab 05/27/11 0515 05/26/11 0940 05/25/11 0337 05/24/11 0620  WBC 8.5 6.9 5.1 3.1*  NEUTROABS -- -- -- --  HGB 9.4* 9.0* 9.3* 11.2*  HCT 31.6* 31.0* 31.1* 37.5  MCV 90.3 91.7 90.9 89.3  PLT 197 179 237 225   Cardiac Enzymes: No results found for this basename: CKTOTAL:5,CKMB:5,CKMBINDEX:5,TROPONINI:5 in the last 168 hours CBG:  Lab 05/27/11 0549 05/26/11 2119 05/26/11 1604 05/26/11 1117 05/26/11 0606  GLUCAP 132* 206* 163* 206* 177*    Iron Studies: No results found for this basename: IRON:30,TIBC:30,SATURATION RATIOS:30,TRANSFERRIN:30,FERRITIN:30 in the last 168 hours Studies/Results: No results found. Medications:    . heparin 1,200 Units/hr (05/26/11 2230)  . DISCONTD: heparin 900 Units/hr (05/26/11 0007)  . DISCONTD: heparin        . calcium acetate  667 mg Oral TID WC  . darbepoetin (ARANESP)  injection - DIALYSIS  150 mcg Intravenous Q Thu-HD  . feeding supplement (NEPRO CARB STEADY)  237 mL Oral TID WC  . ferric gluconate (FERRLECIT/NULECIT) IV  125 mg Intravenous Q T,Th,Sa-HD  . insulin aspart  0-15 Units Subcutaneous TID WC  . insulin aspart  0-5 Units Subcutaneous QHS  . meropenem (MERREM) IV  500 mg Intravenous Q24H  . multivitamin  1 tablet Oral QHS  . paricalcitol  2 mcg Intravenous 3 times weekly  . vancomycin  750 mg Intravenous Q T,Th,Sa-HD  . warfarin  5 mg Oral q1800  . Warfarin - Pharmacist Dosing Inpatient   Does not apply q1800    I  have reviewed scheduled and prn medications.  Physical Exam:  Blood pressure 108/60, pulse 73, temperature 99.3 F (37.4 C), temperature source Oral, resp. rate 21, height 5' 6.5" (1.689 m), weight 68 kg (149 lb 14.6 oz), SpO2 95.00%.  General: stable, alert in no distress Neck: Supple. JVD not elevated.  Lungs: Clear bilat  Heart: regular, no rub Abdomen: Soft, non-tender Groin: right groin dressing in place Ext- right thigh edematous, wound vac over groin wound Neuro: Alert and oriented  X 3; baseline  Psych: Responds to questions appropriately with a normal affect.  Dialysis Access: active right IJ HD cath  Dialysis Orders: Center: [facility] on TTS EDW 66 HD Bath 2K, 2.25 Ca Time 4 hr Heparin 1800 plus 500 prn. Access right I- J cath BFR 400 DFR 800 Zemplar IV/HD Epogen 21,000 Units IV/HD Infed 100 x 3 more doses then 50 / week; ;NOTE HEPARIN IS LISTED AS AN ALLERGY (hit+), BUT GIVEN AT THE DIALYSIS CENTER WHERE IT IS ALSO LISTED AS AN ALLERGY   Assessment/Recommendations 1. S/P removal infected R thigh AVG- outpatient wound culture sent recently from HD unit grew Klebs pneumoniae, indeterminate to levaquin, sensitive to mero, erta, gent, tigacil and cefipime. On IV vanc and meropenem.  Cultures here are growing few GNR's as well, final result pending.  2. ESRD-TTS: HD tomorrow, R IJ TDC, no perm access 3. Hypertension/volume  - BP meds  (norvasc and metoprolol at SNF) on hold due to lowish BP's 4. Anemia - Hb 9's, continue ESA and IV Fe  5. Metabolic bone disease - Continue zemplar 2  6. Nutrition -renal diet + supplements  7. A fib/Chronic coumadin - IV hep and coumadin per pharm 8. DM - SSI  9. Thrombocytopenia - HIT neg; she is getting heparin with HD as outpt 10. Hx CVA w/ left contractures  11. Hx removal infected LUA AVG Mar 2012 (MRSA) and removal of infected R forearm AVG Nov 2012  [name]  MD [facility] [phone] pgr    [phone] cell 05/27/2011, 8:11 AM

## 2011-05-28 DIAGNOSIS — T8579XA Infection and inflammatory reaction due to other internal prosthetic devices, implants and grafts, initial encounter: Secondary | ICD-10-CM

## 2011-05-28 DIAGNOSIS — Y849 Medical procedure, unspecified as the cause of abnormal reaction of the patient, or of later complication, without mention of misadventure at the time of the procedure: Secondary | ICD-10-CM

## 2011-05-28 LAB — GLUCOSE, CAPILLARY
Glucose-Capillary: 229 mg/dL — ABNORMAL HIGH (ref 70–99)
Glucose-Capillary: 161 mg/dL — ABNORMAL HIGH (ref 70–99)

## 2011-05-28 LAB — CBC
HCT: 29.8 % — ABNORMAL LOW (ref 36.0–46.0)
MCHC: 29.5 g/dL — ABNORMAL LOW (ref 30.0–36.0)
RDW: 20.3 % — ABNORMAL HIGH (ref 11.5–15.5)

## 2011-05-28 LAB — TYPE AND SCREEN
ABO/RH(D): O POS
Antibody Screen: NEGATIVE
Unit division: 0

## 2011-05-28 LAB — POCT I-STAT 4, (NA,K, GLUC, HGB,HCT): Hemoglobin: 11.2 g/dL — ABNORMAL LOW (ref 12.0–15.0)

## 2011-05-28 LAB — HEPARIN LEVEL (UNFRACTIONATED)
Heparin Unfractionated: 0.68 IU/mL (ref 0.30–0.70)
Heparin Unfractionated: 0.42 IU/mL (ref 0.30–0.70)

## 2011-05-28 MED ORDER — WARFARIN SODIUM 7.5 MG PO TABS
7.5000 mg | ORAL_TABLET | Freq: Once | ORAL | Status: AC
  Administered 2011-05-28: 7.5 mg via ORAL
  Filled 2011-05-28: qty 1

## 2011-05-28 NOTE — Progress Notes (Addendum)
VASCULAR AND VEIN SURGERY PROGRESS NOTE  POST-OP HEMODIALYSIS ACCESS  Date of Surgery: 05/23/2011 - 05/24/2011 Surgeon: Moishe Spice): Fransisco Hertz, MD 4 Days Post-Op Right thigh REMOVAL OF ARTERIOVENOUS GORETEX GRAFT (AVGG) APPLICATION OF WOUND VAC   HPI: Ebony [name] is a 78 y.o. female who is 4 Days Post-Op removal of right lower extremity Hemodialysis access. The patient denies symptoms of numbness; complains of pain in the operative limb with dressing changes  Significant Diagnostic Studies: CBC Lab Results  Component Value Date   WBC 8.5 05/27/2011   HGB 9.4* 05/27/2011   HCT 31.6* 05/27/2011   MCV 90.3 05/27/2011   PLT 197 05/27/2011    BMET    Component Value Date/Time   NA 139 05/25/2011 0337   K 4.5 05/25/2011 0337   CL 98 05/25/2011 0337   CO2 27 05/25/2011 0337   GLUCOSE 202* 05/25/2011 0337   BUN 30* 05/25/2011 0337   CREATININE 7.37* 05/25/2011 0337   CALCIUM 8.6 05/25/2011 0337   CALCIUM 8.6 07/07/2009 1725   GFRNONAA 5* 05/25/2011 0337   GFRAA 5* 05/25/2011 0337    COAG Lab Results  Component Value Date   INR 1.19 05/27/2011   INR 1.68* 05/24/2011   INR 2.36* 05/23/2011   No results found for this basename: PTT     I/O last 3 completed shifts: In: 240 [P.O.:240] Out: 1034 [Drains:30; Other:1004]  Physical Examination  Patient Vitals for the past 24 hrs:  BP Temp Temp src Pulse Resp SpO2  05/28/11 0421 144/64 mmHg 99.5 F (37.5 C) Oral 69  20  96 %  05/27/11 2025 111/62 mmHg 99.9 F (37.7 C) Oral 87  20  97 %   Right groin wound looks good, clean with wound vac change. right lower Incision is healing well, skin color is normal ,  Rest of packing removed, some staples removed and packed with wound vac sponges Old blood in incisions  Assessment/Plan Ebony [name] is a 78 y.o. year old female who presents s/p removal of right lower extremity Hemodialysis access. Continue wound vac changes Coumadin per pharmacy Will need wound care at home or  SNF   ROCZNIAK,REGINA J 05/28/2011 10:56 AM    Value   Specimen Description  WOUND GROIN RIGHT   Special Requests  PT ON CIPRO   Gram Stain  ABUNDANT WBC PRESENT,BOTH PMN AND MONONUCLEAR RARE SQUAMOUS EPITHELIAL CELLS PRESENT NO ORGANISMS SEEN   Culture  FEW KLEBSIELLA PNEUMONIAE   Report Status  05/27/2011 FINAL   Organism ID, Bacteria  KLEBSIELLA PNEUMONIAE   Resulting Agency  [facility]    Culture & Susceptibility     KLEBSIELLA PNEUMONIAE          Antibiotic  Sensitivity  Microscan  Status      AMPICILLIN  Resistant  >=32  Final      Method:  MIC      AMPICILLIN/SULBACTAM  Resistant  >=32  Final      Method:  MIC      CEFAZOLIN  Resistant  >=64  Final      Method:  MIC      CEFEPIME  Sensitive  <=1  Final      Method:  MIC      CEFOXITIN  Sensitive  <=4  Final      Method:  MIC      CEFTAZIDIME  Sensitive  8  Final      Method:  MIC      CEFTRIAXONE  Intermediate  16  Final      Method:  MIC      CIPROFLOXACIN  Resistant  >=4  Final      Method:  MIC      GENTAMICIN  Sensitive  <=1  Final      Method:  MIC      IMIPENEM  Sensitive  <=0.25  Final      Method:  MIC      LEVOFLOXACIN  Intermediate  4  Final      Method:  MIC      PIP/TAZO  Sensitive  16  Final      Method:  MIC      TOBRAMYCIN  Intermediate  8  Final      Method:  MIC      TRIMETH/SULFA  Resistant  >=320  Final      Method:  MIC       Comments  KLEBSIELLA PNEUMONIAE (MIC)        FEW KLEBSIELLA PNEUMONIAE              Specimen Collected: 05/24/11 3:59 PM  Last Resulted: 05/27/11 8:07 AM                Other Results from 05/22/2011   Addendum  I have independently interviewed and examined the patient, and I agree with the physician assistant's findings.  Right groin and apical wounds clean without any pus or necrotic tissue.  H/H stable.  Continue Coumadin load with Heparin bridge.  Home once Coumadin therapeutic.  VAC to open right thigh incisions and right groin.  ID c/s to help determine  optimal home abx regimen.  Leonides Sake, MD [facility] Office: [phone] Pager: [phone]  05/28/2011, 11:19 AM

## 2011-05-28 NOTE — Progress Notes (Signed)
Subjective: Interval History: none.  Objective: Vital signs in last 24 hours:  Temp:  [99.5 F (37.5 C)-99.9 F (37.7 C)] 99.5 F (37.5 C) (03/18 0421) Pulse Rate:  [69-87] 69  (03/18 0421) Resp:  [20] 20  (03/18 0421) BP: (111-144)/(62-64) 144/64 mmHg (03/18 0421) SpO2:  [96 %-97 %] 96 % (03/18 0421)  Weight change:   Intake/Output: I/O last 3 completed shifts: In: 240 [P.O.:240] Out: 1034 [Drains:30; Other:1004]   Intake/Output this shift:  Total I/O In: 240 [P.O.:240] Out: -   General: stable, alert in no distress  Lungs: Clear bilat  Heart: regular Abdomen: Soft, non-tender  Groin: right groin dressing in place  Ext- right thigh edematous, wound vac over groin wound  Dialysis Access: active right IJ HD cath   Lab Results:  Basename 05/27/11 0515 05/26/11 0940  WBC 8.5 6.9  HGB 9.4* 9.0*  HCT 31.6* 31.0*  PLT 197 179   BMET No results found for this basename: NA:3,K:3,CL:3,CO2:3,GLUCOSE:3,BUN:3,CREATININE:3,CALCIUM:3,MAG:3,PHOS:3 in the last 72 hours LFT No results found for this basename: PROT,ALBUMIN,AST,ALT,ALKPHOS,BILITOT,BILIDIR,IBILI in the last 72 hours PT/INR  Basename 05/27/11 0515  LABPROT 15.4*  INR 1.19   Hepatitis Panel No results found for this basename: HEPBSAG,HCVAB,HEPAIGM,HEPBIGM in the last 72 hours  Studies/Results: No results found.  I have reviewed the patient's current medications.  Assessment/Plan: 1. S/P removal infected R thigh AVG- klebsiella Sensitive to merepenum 2. ESRD-TTS: HD tomorrow, R IJ TDC, no perm access  3. Hypertension/volume - BP controlled 4. Anemia - Hb 9's, continue ESA and IV Fe  5. Metabolic bone disease - Continue zemplar 2  6. Nutrition -renal diet + supplements  7. A fib/Chronic coumadin - IV hep and coumadin per pharm  8. DM - SSI  9. Thrombocytopenia - HIT neg; she is getting heparin with HD as outpt  10. Hx CVA w/ left contractures  11. Hx removal infected LUA AVG Mar 2012 (MRSA) and removal  of infected R forearm AVG Nov 2012  Plan dialysis in AM   LOS: 5 Nyxon Strupp W @TODAY @9 :11 AM

## 2011-05-28 NOTE — Consult Note (Signed)
Infectious Diseases Initial Consultation  Reason for Consultation:  Antibiotic management   HPI: Ebony [name] is a 78 y.o. female with ESRD on HD, NH resident with recent seroma noted of right groin graft and vein occlusion.  Initially taken to OR 3/4 for ligation and noted above.  It was noted to have drainage and on 3/14, after a postponment due to a high INR, the AVG was removed.  It was noted to have pus and cultures have grown Klebsiella.  The patient now has no complaints including no fever, chills or significant pain.    Past Medical History  Diagnosis Date  . Atrial fibrillation   . CAD (coronary artery disease)   . Hypertension   . Anemia   . MRSA bacteremia     hx  . Cellulitis   . GERD (gastroesophageal reflux disease)   . Acetabulum fracture   . Contracture of elbow     L arm  . Dementia   . Diabetes mellitus     on Insulin  . Decubitus ulcer, buttock 02/07/2011    Pt presents with dressing to R buttocks/coccyx.  . Pneumonia   . Blood transfusion   . Stroke ~ 1990's    Left side paralysis.  Left  foot drop., L arm contracted.  . Chronic kidney disease 03/13/11    dialysis at [facility]; TTS,last tx 03/12/11 hemo x 20years    Allergies:  Allergies  Allergen Reactions  . Aspirin     Unknown    . Heparin     SHE is HIT neg; platelets have been normal on low dose heparin at out pt dialysis ; not clear why this is listed   . Minoxidil     Unknown    . Penicillins     Unknown      Current antibiotics:   MEDICATIONS:    . calcium acetate  667 mg Oral TID WC  . darbepoetin (ARANESP) injection - DIALYSIS  150 mcg Intravenous Q Thu-HD  . feeding supplement (NEPRO CARB STEADY)  237 mL Oral TID WC  . ferric gluconate (FERRLECIT/NULECIT) IV  125 mg Intravenous Q T,Th,Sa-HD  . insulin aspart  0-15 Units Subcutaneous TID WC  . insulin aspart  0-5 Units Subcutaneous QHS  . meropenem (MERREM) IV  500 mg Intravenous Q24H  . multivitamin  1 tablet Oral QHS   . paricalcitol  2 mcg Intravenous 3 times weekly  . vancomycin  750 mg Intravenous Q T,Th,Sa-HD  . warfarin  7.5 mg Oral ONCE-1800  . warfarin  7.5 mg Oral ONCE-1800  . Warfarin - Pharmacist Dosing Inpatient   Does not apply q1800    History  Substance Use Topics  . Smoking status: Former Smoker -- 0.2 packs/day for 30 years    Types: Cigarettes    Quit date: 03/12/2000  . Smokeless tobacco: Never Used  . Alcohol Use: No     "last alcohol ~ 1990"    Family History  Problem Relation Age of Onset  . Diabetes Mother   . Hypertension Father   . Anesthesia problems Neg Hx   . Hypotension Neg Hx   . Malignant hyperthermia Neg Hx   . Pseudochol deficiency Neg Hx     Review of Systems - Negative except as per the HPI  OBJECTIVE: Temp:  [97.7 F (36.5 C)-99.9 F (37.7 C)] 97.7 F (36.5 C) (03/18 1312) Pulse Rate:  [69-87] 70  (03/18 1312) Resp:  [18-20] 18  (03/18 1312) BP: (105-144)/(60-64) 105/60  mmHg (03/18 1312) SpO2:  [96 %-97 %] 96 % (03/18 1312) General appearance: alert, cooperative and no distress Resp: clear to auscultation bilaterally Cardio: regular rate and rhythm, S1, S2 normal, no murmur, click, rub or gallop Extremities: contracted upper extremities  LABS: Results for orders placed during the hospital encounter of 05/23/11 (from the past 48 hour(s))  GLUCOSE, CAPILLARY     Status: Abnormal   Collection Time   05/26/11  4:04 PM      Component Value Range Comment   Glucose-Capillary 163 (*) 70 - 99 (mg/dL)   HEPARIN LEVEL (UNFRACTIONATED)     Status: Abnormal   Collection Time   05/26/11  7:51 PM      Component Value Range Comment   Heparin Unfractionated 0.25 (*) 0.30 - 0.70 (IU/mL)   GLUCOSE, CAPILLARY     Status: Abnormal   Collection Time   05/26/11  9:19 PM      Component Value Range Comment   Glucose-Capillary 206 (*) 70 - 99 (mg/dL)    Comment 1 Documented in Chart      Comment 2 Notify RN     PROTIME-INR     Status: Abnormal   Collection Time    05/27/11  5:15 AM      Component Value Range Comment   Prothrombin Time 15.4 (*) 11.6 - 15.2 (seconds)    INR 1.19  0.00 - 1.49    HEPARIN LEVEL (UNFRACTIONATED)     Status: Normal   Collection Time   05/27/11  5:15 AM      Component Value Range Comment   Heparin Unfractionated 0.59  0.30 - 0.70 (IU/mL)   CBC     Status: Abnormal   Collection Time   05/27/11  5:15 AM      Component Value Range Comment   WBC 8.5  4.0 - 10.5 (K/uL)    RBC 3.50 (*) 3.87 - 5.11 (MIL/uL)    Hemoglobin 9.4 (*) 12.0 - 15.0 (g/dL)    HCT 16.1 (*) 09.6 - 46.0 (%)    MCV 90.3  78.0 - 100.0 (fL)    MCH 26.9  26.0 - 34.0 (pg)    MCHC 29.7 (*) 30.0 - 36.0 (g/dL)    RDW 04.5 (*) 40.9 - 15.5 (%)    Platelets 197  150 - 400 (K/uL)   GLUCOSE, CAPILLARY     Status: Abnormal   Collection Time   05/27/11  5:49 AM      Component Value Range Comment   Glucose-Capillary 132 (*) 70 - 99 (mg/dL)    Comment 1 Documented in Chart      Comment 2 Notify RN     GLUCOSE, CAPILLARY     Status: Abnormal   Collection Time   05/27/11 11:35 AM      Component Value Range Comment   Glucose-Capillary 208 (*) 70 - 99 (mg/dL)    Comment 1 Notify RN     HEPARIN LEVEL (UNFRACTIONATED)     Status: Abnormal   Collection Time   05/27/11  1:52 PM      Component Value Range Comment   Heparin Unfractionated 0.93 (*) 0.30 - 0.70 (IU/mL)   GLUCOSE, CAPILLARY     Status: Abnormal   Collection Time   05/27/11  4:39 PM      Component Value Range Comment   Glucose-Capillary 283 (*) 70 - 99 (mg/dL)    Comment 1 Notify RN     GLUCOSE, CAPILLARY     Status: Abnormal  Collection Time   05/27/11  9:33 PM      Component Value Range Comment   Glucose-Capillary 145 (*) 70 - 99 (mg/dL)    Comment 1 Documented in Chart      Comment 2 Notify RN     HEPARIN LEVEL (UNFRACTIONATED)     Status: Normal   Collection Time   05/28/11 12:32 AM      Component Value Range Comment   Heparin Unfractionated 0.68  0.30 - 0.70 (IU/mL)   GLUCOSE, CAPILLARY      Status: Abnormal   Collection Time   05/28/11  6:01 AM      Component Value Range Comment   Glucose-Capillary 118 (*) 70 - 99 (mg/dL)    Comment 1 Documented in Chart      Comment 2 Notify RN     HEPARIN LEVEL (UNFRACTIONATED)     Status: Normal   Collection Time   05/28/11  9:50 AM      Component Value Range Comment   Heparin Unfractionated 0.42  0.30 - 0.70 (IU/mL)   PROTIME-INR     Status: Abnormal   Collection Time   05/28/11  9:50 AM      Component Value Range Comment   Prothrombin Time 17.0 (*) 11.6 - 15.2 (seconds)    INR 1.36  0.00 - 1.49    CBC     Status: Abnormal   Collection Time   05/28/11  9:50 AM      Component Value Range Comment   WBC 5.6  4.0 - 10.5 (K/uL)    RBC 3.32 (*) 3.87 - 5.11 (MIL/uL)    Hemoglobin 8.8 (*) 12.0 - 15.0 (g/dL)    HCT 47.8 (*) 29.5 - 46.0 (%)    MCV 89.8  78.0 - 100.0 (fL)    MCH 26.5  26.0 - 34.0 (pg)    MCHC 29.5 (*) 30.0 - 36.0 (g/dL)    RDW 62.1 (*) 30.8 - 15.5 (%)    Platelets 215  150 - 400 (K/uL)   GLUCOSE, CAPILLARY     Status: Abnormal   Collection Time   05/28/11 11:23 AM      Component Value Range Comment   Glucose-Capillary 165 (*) 70 - 99 (mg/dL)    Comment 1 STAT Lab      Comment 2 Notify RN       MICRO: Klebsiella pneumoniae wound culture, S - cefepime, cefoxitin, ceftazidime, imipenem                 Blood cultures - none  Antibiotics - meropenem day 4                     Vancomycin day 4                     cipro 3/12 x 1 dose  IMAGING: No results found.  HISTORICAL MICRO/IMAGING  Assessment/Plan:  Graft infection, s/p removal  1) AVG removal - WBC ok, afebrile since 3/16.  She should complete two weeks of treatment total from 3/15, when meropenem was started through 3/28, with ceftazidime which can be given with dialysis.    Thanks for the consult

## 2011-05-28 NOTE — Progress Notes (Signed)
Clinical Social Work Department BRIEF PSYCHOSOCIAL ASSESSMENT 05/28/2011  Patient:  Ebony [name], Ebony [name]     Account Number:  [id number]     Admit date:  05/23/2011  Clinical Social Worker:  Peggyann Shoals  Date/Time:  05/28/2011 03:00 PM  Referred by:  Physician  Date Referred:  05/28/2011 Referred for  SNF Placement   Other Referral:   Pt admitted from facility. P   Interview type:  Patient Other interview type:    PSYCHOSOCIAL DATA Living Status:   Admitted from facility:  [facility], [place] Level of care:  Skilled Nursing Facility Primary support name:   Primary support relationship to patient:  FAMILY Degree of support available:   Pt reported that she has two sons that live in [place], they provide adequate support.    CURRENT CONCERNS Current Concerns  Post-Acute Placement   Other Concerns:    SOCIAL WORK ASSESSMENT / PLAN CSW met with pt to address consult. CSW introduced herself to pt and explained role of social work. Pt is known to this CSW. Pt is a resident of [address], [place]. Pt shared that she would like to return to facility when she is medically ready. CSW was contacted by facility and pt is able to return.   Assessment/plan status:  Other - See comment Other assessment/ plan:   [place] Planning to SNF:  CSW will update existing FL2 and send to [facility] [place].  CSW will facilitate discharge to SNF.   Information/referral to community resources:   as needed.    PATIENT'S/FAMILY'S RESPONSE TO PLAN OF CARE: Pt was alert and oriented. Pt is agreeable to returnign to [facility] [place] when medically ready. Pt declined to have anyone in her family contacted.

## 2011-05-28 NOTE — Progress Notes (Signed)
ANTICOAGULATION CONSULT NOTE - Follow Up Consult  Pharmacy Consult for Coumadin, Heparin ; Meropenem, Vancomycin Indication: Atrial fibrillation ;  R thigh AVG infection with h/o MRSA; Klebsiella- sensitive to Merrem     Allergies  Allergen Reactions  . Aspirin     Unknown    . Heparin     SHE is HIT neg; platelets have been normal on low dose heparin at out pt dialysis ; not clear why this is listed   . Minoxidil     Unknown    . Penicillins     Unknown      Patient Measurements: Height: 5' 6.5" (168.9 cm) Weight: 149 lb 14.6 oz (68 kg) IBW/kg (Calculated) : 60.45    Vital Signs: Temp: 99.5 F (37.5 C) (03/18 0421) Temp src: Oral (03/18 0421) BP: 144/64 mmHg (03/18 0421) Pulse Rate: 69  (03/18 0421)  Labs:  Alvira Philips 05/28/11 0950 05/28/11 0032 05/27/11 1352 05/27/11 0515 05/26/11 0940  HGB 8.8* -- -- 9.4* --  HCT 29.8* -- -- 31.6* 31.0*  PLT 215 -- -- 197 179  APTT -- -- -- -- --  LABPROT 17.0* -- -- 15.4* --  INR 1.36 -- -- 1.19 --  HEPARINUNFRC 0.42 0.68 0.93* -- --  CREATININE -- -- -- -- --  CKTOTAL -- -- -- -- --  CKMB -- -- -- -- --  TROPONINI -- -- -- -- --   Estimated Creatinine Clearance: 5.9 ml/min (by C-G formula based on Cr of 7.37).   Medications:  Prescriptions prior to admission  Medication Sig Dispense Refill  . amLODipine (NORVASC) 5 MG tablet Take 5 mg by mouth at bedtime.      . calcium acetate (PHOSLO) 667 MG capsule Take 667 mg by mouth 3 (three) times daily with meals.       . darbepoetin (ARANESP) 200 MCG/0.4ML SOLN Inject 200 mcg into the skin every 7 (seven) days. Takes on Thursdays.      Marland Kitchen dextromethorphan-guaiFENesin (ROBITUSSIN-DM) 10-100 MG/5ML liquid Take 10 mLs by mouth every 6 (six) hours as needed. For cough      . docusate sodium (COLACE) 100 MG capsule Take 100 mg by mouth 2 (two) times daily.       . DULoxetine (CYMBALTA) 20 MG capsule Take 20 mg by mouth daily.      . feeding supplement (PRO-STAT SUGAR FREE 64) LIQD  Take 30 mLs by mouth 2 (two) times daily with a meal.       . insulin glargine (LANTUS) 100 UNIT/ML injection Inject 5 Units into the skin at bedtime.       . lidocaine-prilocaine (EMLA) cream Apply 1 application topically 3 (three) times a week. Tues, thurs, and sat 30 mins before dialysis      . metoprolol tartrate (LOPRESSOR) 25 MG tablet Take 25-50 mg by mouth See admin instructions. 1 tab twice daily on Sun, Mon., Wed., Fri.; 1 tab daily on Tues, Thurs, and Sat      . multivitamin (RENA-VIT) TABS tablet Take 1 tablet by mouth at bedtime.  30 tablet  5  . Nutritional Supplements (FEEDING SUPPLEMENT, NEPRO CARB STEADY,) LIQD Take 237 mLs by mouth 3 (three) times daily with meals.      Marland Kitchen omeprazole (PRILOSEC) 20 MG capsule Take 20 mg by mouth daily.       Marland Kitchen oxyCODONE-acetaminophen (PERCOCET) 5-325 MG per tablet Take 1-2 tablets by mouth every 4 (four) hours as needed. For pain      . paricalcitol (ZEMPLAR) 5  MCG/ML injection Inject 0.2 mLs (1 mcg total) into the vein 3 (three) times a week.  1 mL    . risperiDONE (RISPERDAL) 0.25 MG tablet Take 0.25 mg by mouth at bedtime.       . simvastatin (ZOCOR) 20 MG tablet Take 20 mg by mouth at bedtime.       Marland Kitchen warfarin (COUMADIN) 5 MG tablet Take 5 mg by mouth daily.      . bisacodyl (DULCOLAX) 5 MG EC tablet Take 10 mg by mouth daily as needed. For constipation      . calcium carbonate, dosed in mg elemental calcium, 1250 MG/5ML Take 500 mg of elemental calcium by mouth every 6 (six) hours as needed. For acid reflux       Scheduled:    . calcium acetate  667 mg Oral TID WC  . darbepoetin (ARANESP) injection - DIALYSIS  150 mcg Intravenous Q Thu-HD  . feeding supplement (NEPRO CARB STEADY)  237 mL Oral TID WC  . ferric gluconate (FERRLECIT/NULECIT) IV  125 mg Intravenous Q T,Th,Sa-HD  . insulin aspart  0-15 Units Subcutaneous TID WC  . insulin aspart  0-5 Units Subcutaneous QHS  . meropenem (MERREM) IV  500 mg Intravenous Q24H  . multivitamin  1  tablet Oral QHS  . paricalcitol  2 mcg Intravenous 3 times weekly  . vancomycin  750 mg Intravenous Q T,Th,Sa-HD  . warfarin  7.5 mg Oral ONCE-1800  . Warfarin - Pharmacist Dosing Inpatient   Does not apply q1800  . DISCONTD: warfarin  5 mg Oral q1800   Infusions:    . heparin 1,050 Units/hr (05/27/11 1630)  . DISCONTD: heparin 1,200 Units/hr (05/26/11 2230)   Anti-infectives     Start     Dose/Rate Route Frequency Ordered Stop   05/24/11 2000   meropenem (MERREM) 500 mg in sodium chloride 0.9 % 50 mL IVPB        500 mg 100 mL/hr over 30 Minutes Intravenous Every 24 hours 05/24/11 1709     05/24/11 1200   vancomycin (VANCOCIN) 750 mg in sodium chloride 0.9 % 150 mL IVPB        750 mg 150 mL/hr over 60 Minutes Intravenous Every T-Th-Sa (Hemodialysis) 05/23/11 1644     05/23/11 1800   ciprofloxacin (CIPRO) tablet 500 mg  Status:  Discontinued        500 mg Oral Daily-1800 05/23/11 1535 05/24/11 1639   05/23/11 1645   vancomycin (VANCOCIN) 1,500 mg in sodium chloride 0.9 % 500 mL IVPB        1,500 mg 250 mL/hr over 120 Minutes Intravenous  Once 05/23/11 1643 05/23/11 2336   05/22/11 1348   vancomycin (VANCOCIN) IVPB 1000 mg/200 mL premix  Status:  Discontinued        1,000 mg 200 mL/hr over 60 Minutes Intravenous  Once 05/22/11 1348 05/23/11 1302         Assessment:  R thigh AVG infection with h/o MRSA; Klebsiella- sensitive to Merrem   Patient with ESRD on HD TTS  Heparin level therapeutic 0.42  INR 1.36   Goal of Therapy:   Vanco Level 20-25 pre-HD before HD or <20 Post-HD   Clinical resolution of infection.   INR  2 - 3  Heparin level  0.3 - 0.7 units/ml   Plan:   Merrem 500mg  IV q24h- post HD on HD days.   Continue Vancomycin 750mg  IV post-HD T-Th-S.  Continue Heparin at 1050 units/hr  Coumadin 7.5 mg  todayValinda Party, Elisha Headland, Pharm.D. 05/28/2011 12:48 PM

## 2011-05-28 NOTE — Progress Notes (Signed)
ANTICOAGULATION CONSULT NOTE - Follow Up Consult  Pharmacy Consult for heparin Indication: atrial fibrillation  Labs:  Basename 05/28/11 0032 05/27/11 1352 05/27/11 0515 05/26/11 0940 05/25/11 0337  HGB -- -- 9.4* 9.0* --  HCT -- -- 31.6* 31.0* 31.1*  PLT -- -- 197 179 237  APTT -- -- -- -- --  LABPROT -- -- 15.4* -- --  INR -- -- 1.19 -- --  HEPARINUNFRC 0.68 0.93* 0.59 -- --  CREATININE -- -- -- -- 7.37*  CKTOTAL -- -- -- -- --  CKMB -- -- -- -- --  TROPONINI -- -- -- -- --    Assessment/Plan: 78yo female now therapeutic on heparin for Afib though at higher end of goal.  Will continue heparin at current rate and confirm stable with am labs.   Colleen Can PharmD BCPS 05/28/2011,1:29 AM

## 2011-05-28 NOTE — Consult Note (Addendum)
WOC consult Note Reason for Consult:Consult requested for right groin and right thigh full thickness wounds after debridement in OR.  VVS team following for assessment and plan of care. Wound type: Right groin full thickness .5X6X1cm, beefy red,  Mod pink drainage in cannister.  One piece black sponge applied to 125cm cont suction after pain meds given,  Area very painful to touch.  Right thigh full thickness wound with packing difficult to remove.  Pulled out one large piece of gauze, large amt clotted old blood and some new blood beginning to fill wound bed.  Called PA to assess wound and she pulled out additional gauze packing after removing some staples to left inner thigh.  Mod amt bleeding when dressing removed. Entire process very painful and pt crying despite meds given earlier. 5X2.5X2.5cm with tunneling to 12oclock at 5 cm.   Beefy red woundbed.  Wound between staples to left thigh beefy red 2X1X2.5cm with mod amt bloody drainage. Dressing procedure/placement/frequency: Applied one piece black foam to each wound and areas bridged together to 125cm cont suction.  Plan dressing change on Wed.  Cammie Mcgee, RN, MSN, [employer]  [phone]

## 2011-05-29 ENCOUNTER — Inpatient Hospital Stay (HOSPITAL_COMMUNITY): Payer: Medicare Other

## 2011-05-29 LAB — RENAL FUNCTION PANEL
BUN: 24 mg/dL — ABNORMAL HIGH (ref 6–23)
CO2: 28 mEq/L (ref 19–32)
GFR calc Af Amer: 7 mL/min — ABNORMAL LOW (ref >90)
Glucose, Bld: 96 mg/dL (ref 70–99)
Phosphorus: 4.2 mg/dL (ref 2.3–4.6)
Potassium: 3.7 mEq/L (ref 3.5–5.1)
Sodium: 136 mEq/L (ref 135–145)

## 2011-05-29 LAB — GLUCOSE, CAPILLARY
Glucose-Capillary: 155 mg/dL — ABNORMAL HIGH (ref 70–99)
Glucose-Capillary: 129 mg/dL — ABNORMAL HIGH (ref 70–99)
Glucose-Capillary: 231 mg/dL — ABNORMAL HIGH (ref 70–99)
Glucose-Capillary: 212 mg/dL — ABNORMAL HIGH (ref 70–99)

## 2011-05-29 LAB — CBC
Hemoglobin: 7.9 g/dL — ABNORMAL LOW (ref 12.0–15.0)
MCH: 27.1 pg (ref 26.0–34.0)
MCHC: 30.2 g/dL (ref 30.0–36.0)
MCV: 89.7 fL (ref 78.0–100.0)
RBC: 2.92 MIL/uL — ABNORMAL LOW (ref 3.87–5.11)

## 2011-05-29 LAB — CATH TIP CULTURE

## 2011-05-29 LAB — VANCOMYCIN, RANDOM: Vancomycin Rm: 20.6 ug/mL

## 2011-05-29 LAB — HEPARIN LEVEL (UNFRACTIONATED)
Heparin Unfractionated: 1.54 IU/mL — ABNORMAL HIGH (ref 0.30–0.70)
Heparin Unfractionated: 1.07 IU/mL — ABNORMAL HIGH (ref 0.30–0.70)

## 2011-05-29 MED ORDER — PARICALCITOL 5 MCG/ML IV SOLN
INTRAVENOUS | Status: AC
  Administered 2011-05-29: 2 ug via INTRAVENOUS
  Filled 2011-05-29: qty 1

## 2011-05-29 MED ORDER — HEPARIN (PORCINE) IN NACL 100-0.45 UNIT/ML-% IJ SOLN
850.0000 [IU]/h | INTRAMUSCULAR | Status: DC
Start: 2011-05-29 — End: 2011-05-31
  Filled 2011-05-29 (×3): qty 250

## 2011-05-29 MED ORDER — DEXTROSE 5 % IV SOLN
2.0000 g | INTRAVENOUS | Status: DC
  Administered 2011-05-31: 2 g via INTRAVENOUS
  Filled 2011-05-29 (×3): qty 2

## 2011-05-29 MED ORDER — WARFARIN SODIUM 7.5 MG PO TABS
7.5000 mg | ORAL_TABLET | Freq: Once | ORAL | Status: AC
  Administered 2011-05-29: 7.5 mg via ORAL
  Filled 2011-05-29 (×2): qty 1

## 2011-05-29 NOTE — Progress Notes (Signed)
[facility]  Daily Progress Note  Assessment/Planning: POD #5 s/p R infected thigh AVG excision   Awaiting Coumadin load w/ Heparin bridge (Lovenox not any option due to ESRD)  Cont VAC  Will switch antibiotics to Ceftazidime (can be given with dialysis)  Appreciate ID input  Subjective  - 5 Days Post-Op  Pain better  Objective Filed Vitals:   05/29/11 0730 05/29/11 0810 05/29/11 0815 05/29/11 0830  BP: 119/60 111/56 125/60 108/60  Pulse: 60 42 77 66  Temp: 97.3 F (36.3 C)     TempSrc: Oral     Resp: 12 14 12 12   Height:      Weight:      SpO2:        Intake/Output Summary (Last 24 hours) at 05/29/11 0858 Last data filed at 05/29/11 4098  Gross per 24 hour  Intake 1060.2 ml  Output     50 ml  Net 1010.2 ml    PULM  CTAB CV  RRR GI  soft, NTND VASC  R thigh VAC adherent, no erythema around stapled incisions  Laboratory CBC    Component Value Date/Time   WBC 5.6 05/28/2011 0950   HGB 8.8* 05/28/2011 0950   HCT 29.8* 05/28/2011 0950   PLT 215 05/28/2011 0950    BMET    Component Value Date/Time   NA 139 05/25/2011 0337   K 4.5 05/25/2011 0337   CL 98 05/25/2011 0337   CO2 27 05/25/2011 0337   GLUCOSE 202* 05/25/2011 0337   BUN 30* 05/25/2011 0337   CREATININE 7.37* 05/25/2011 0337   CALCIUM 8.6 05/25/2011 0337   CALCIUM 8.6 07/07/2009 1725   GFRNONAA 5* 05/25/2011 0337   GFRAA 5* 05/25/2011 0337   Lab Results  Component Value Date   INR 1.36 05/28/2011   INR 1.19 05/27/2011   INR 1.68* 05/24/2011    Leonides Sake, MD [facility] Office: [phone] Pager: [phone]  05/29/2011, 8:58 AM

## 2011-05-29 NOTE — Progress Notes (Signed)
Subjective: Interval History: none. RUNNING A FEVER LAST NIGHT  Objective: Vital signs in last 24 hours:  Temp:  [97.3 F (36.3 C)-101.4 F (38.6 C)] 97.3 F (36.3 C) (03/19 0730) Pulse Rate:  [60-84] 60  (03/19 0730) Resp:  [12-20] 12  (03/19 0730) BP: (98-130)/(56-73) 119/60 mmHg (03/19 0730) SpO2:  [95 %-96 %] 95 % (03/19 0447) Weight:  [67.132 kg (148 lb)] 67.132 kg (148 lb) (03/19 0500)  Weight change:   Intake/Output: I/O last 3 completed shifts: In: 1540.2 [P.O.:800; I.V.:740.2] Out: 23 [Drains:55]   Intake/Output this shift:  Total I/O In: -  Out: 25 [Drains:25]  General: stable, alert in no distress  Lungs: Clear bilat  Heart: regular  Abdomen: Soft, non-tender  Groin: right groin dressing in place  Ext- right thigh edematous, wound vac over groin wound  Dialysis Access: active right IJ HD cath   Lab Results:  Basename 05/28/11 0950 05/27/11 0515 05/26/11 0940  WBC 5.6 8.5 6.9  HGB 8.8* 9.4* 9.0*  HCT 29.8* 31.6* 31.0*  PLT 215 197 179   BMET No results found for this basename: NA:3,K:3,CL:3,CO2:3,GLUCOSE:3,BUN:3,CREATININE:3,CALCIUM:3,MAG:3,PHOS:3 in the last 72 hours LFT No results found for this basename: PROT,ALBUMIN,AST,ALT,ALKPHOS,BILITOT,BILIDIR,IBILI in the last 72 hours PT/INR  Basename 05/28/11 0950 05/27/11 0515  LABPROT 17.0* 15.4*  INR 1.36 1.19   Hepatitis Panel No results found for this basename: HEPBSAG,HCVAB,HEPAIGM,HEPBIGM in the last 72 hours  Studies/Results: No results found.  I have reviewed the patient's current medications.  Assessment/Plan: 1. S/P removal infected R thigh AVG- klebsiella Sensitive to merepenum  2. ESRD-TTS: HD today, R IJ TDC, no perm access  3. Hypertension/volume - BP controlled  4. Anemia - Hb 9's, continue ESA and IV Fe drifting down will follow 5. Metabolic bone disease - Continue zemplar 2  6. Nutrition -renal diet + supplements  7. A fib/Chronic coumadin - IV hep and coumadin per pharm  8.  DM - SSI  9. Thrombocytopenia - HIT neg; she is getting heparin with HD as outpt  10. Hx CVA w/ left contractures  11. Hx removal infected LUA AVG Mar 2012 (MRSA) and removal of infected R forearm AVG Nov 2012  [facility] AND [facility]OPENUM    Patient seen on dialysis no issues with treatment  Running temperature overnight      LOS: 6 Devell Parkerson W @TODAY @8 :39 AM

## 2011-05-29 NOTE — Progress Notes (Signed)
ANTICOAGULATION CONSULT NOTE - Follow Up Consult  Pharmacy Consult for Heparin Indication: atrial fibrillation   Patient Measurements: Height: 5' 6.5" (168.9 cm) Weight: 145 lb 11.6 oz (66.1 kg) IBW/kg (Calculated) : 60.45     Labs:  Basename 05/29/11 1246 05/29/11 0810 05/29/11 0717 05/28/11 0950 05/27/11 0515  HGB -- 7.9* -- 8.8* --  HCT -- 26.2* -- 29.8* 31.6*  PLT -- 237 -- 215 197  APTT -- -- -- -- --  LABPROT -- 21.9* -- 17.0* 15.4*  INR -- 1.88* -- 1.36 1.19  HEPARINUNFRC 1.07* 1.54* -- 0.42 --  CREATININE -- -- 5.97* -- --  CKTOTAL -- -- -- -- --  CKMB -- -- -- -- --  TROPONINI -- -- -- -- --     Medications:  Infusions:    . heparin    . DISCONTD: heparin 1,050 Units/hr (05/27/11 1630)    Assessment:  Repeat, post-HD level reported as 1.07--SUPRAtherapeutic.  Will need to adjust heparin rate.  No complications observed.  Goal of Therapy:   Heparin level 0.3-0.7 units/ml   Plan:   Hold Heparin infusion x 1 hour.  Restart Heparin at 850 units/hr.  Next Heparin level in 8 hours after restart.  Ebony [name], Elisha Headland, Pharm.D. 05/29/2011 2:43 PM

## 2011-05-29 NOTE — Progress Notes (Signed)
ANTICOAGULATION CONSULT NOTE - Follow Up Consult  Pharmacy Consult for:  Coumadin, Heparin ; Meropenem, Vancomycin Indication: Atrial fibrillation ; R thigh AVG infection with h/o MRSA; Klebsiella- sensitive to Merrem    Allergies  Allergen Reactions  . Aspirin     Unknown    . Heparin     SHE is HIT neg; platelets have been normal on low dose heparin at out pt dialysis ; not clear why this is listed   . Minoxidil     Unknown    . Penicillins     Unknown      Patient Measurements: Height: 5' 6.5" (168.9 cm) Weight: 148 lb (67.132 kg) IBW/kg (Calculated) : 60.45    Vital Signs: Temp: 97.3 F (36.3 C) (03/19 0730) Temp src: Oral (03/19 0730) BP: 118/57 mmHg (03/19 0930) Pulse Rate: 62  (03/19 0930)  Labs:  Basename 05/29/11 0810 05/29/11 0717 05/28/11 0950 05/28/11 0032 05/27/11 0515  HGB 7.9* -- 8.8* -- --  HCT 26.2* -- 29.8* -- 31.6*  PLT 237 -- 215 -- 197  APTT -- -- -- -- --  LABPROT 21.9* -- 17.0* -- 15.4*  INR 1.88* -- 1.36 -- 1.19  HEPARINUNFRC 1.54* -- 0.42 0.68 --  CREATININE -- 5.97* -- -- --  CKTOTAL -- -- -- -- --  CKMB -- -- -- -- --  TROPONINI -- -- -- -- --   Estimated Creatinine Clearance: 7.3 ml/min (by C-G formula based on Cr of 5.97).   Medications:  Scheduled:    . calcium acetate  667 mg Oral TID WC  . darbepoetin (ARANESP) injection - DIALYSIS  150 mcg Intravenous Q Thu-HD  . feeding supplement (NEPRO CARB STEADY)  237 mL Oral TID WC  . ferric gluconate (FERRLECIT/NULECIT) IV  125 mg Intravenous Q T,Th,Sa-HD  . insulin aspart  0-15 Units Subcutaneous TID WC  . insulin aspart  0-5 Units Subcutaneous QHS  . meropenem (MERREM) IV  500 mg Intravenous Q24H  . multivitamin  1 tablet Oral QHS  . paricalcitol  2 mcg Intravenous 3 times weekly  . vancomycin  750 mg Intravenous Q T,Th,Sa-HD  . warfarin  7.5 mg Oral ONCE-1800  . Warfarin - Pharmacist Dosing Inpatient   Does not apply q1800   Infusions:    . heparin 1,050 Units/hr  (05/27/11 1630)   Anti-infectives     Start     Dose/Rate Route Frequency Ordered Stop   05/24/11 2000   meropenem (MERREM) 500 mg in sodium chloride 0.9 % 50 mL IVPB        500 mg 100 mL/hr over 30 Minutes Intravenous Every 24 hours 05/24/11 1709     05/24/11 1200   vancomycin (VANCOCIN) 750 mg in sodium chloride 0.9 % 150 mL IVPB        750 mg 150 mL/hr over 60 Minutes Intravenous Every T-Th-Sa (Hemodialysis) 05/23/11 1644     05/23/11 1800   ciprofloxacin (CIPRO) tablet 500 mg  Status:  Discontinued        500 mg Oral Daily-1800 05/23/11 1535 05/24/11 1639   05/23/11 1645   vancomycin (VANCOCIN) 1,500 mg in sodium chloride 0.9 % 500 mL IVPB        1,500 mg 250 mL/hr over 120 Minutes Intravenous  Once 05/23/11 1643 05/23/11 2336   05/22/11 1348   vancomycin (VANCOCIN) IVPB 1000 mg/200 mL premix  Status:  Discontinued        1,000 mg 200 mL/hr over 60 Minutes Intravenous  Once 05/22/11 1348  05/23/11 1302          Assessment:  R thigh AVG infection with h/o MRSA; Klebsiella- sensitive to Merrem  POD #5 s/p R infected thigh AVG excision  Patient with ESRD on HD TTS  Vancomycin pre-HD level 20.6 mcg/ml (at therapeutic goal)  Heparin level reported as SUPRAtherapeutic at 1.54 drawn in dialysis (unsure if this represents true level).  INR 1.88 (still slightly below goal).  ID recommends 2 weeks total therapy with antibiotics, completing the course with Ceftazidime.  Goal of Therapy:   INR 2-3  Heparin level 0.3-0.7 units/ml   Plan:   Repeat Heparin level, adjust rate if indicated.  Coumadin 7.5 mg today.  Continue Vancomycin and Merrem until changed by primary medical team.  Laurena Bering, Pharm.D. 05/29/2011 10:07 AM

## 2011-05-29 NOTE — Procedures (Signed)
Patient seen on dialysis 

## 2011-05-30 LAB — GLUCOSE, CAPILLARY
Glucose-Capillary: 136 mg/dL — ABNORMAL HIGH (ref 70–99)
Glucose-Capillary: 104 mg/dL — ABNORMAL HIGH (ref 70–99)
Glucose-Capillary: 142 mg/dL — ABNORMAL HIGH (ref 70–99)
Glucose-Capillary: 230 mg/dL — ABNORMAL HIGH (ref 70–99)

## 2011-05-30 LAB — HEPARIN LEVEL (UNFRACTIONATED)
Heparin Unfractionated: 0.38 IU/mL (ref 0.30–0.70)
Heparin Unfractionated: 0.5 IU/mL (ref 0.30–0.70)

## 2011-05-30 LAB — ANAEROBIC CULTURE

## 2011-05-30 MED ORDER — WARFARIN SODIUM 10 MG PO TABS
10.0000 mg | ORAL_TABLET | Freq: Once | ORAL | Status: AC
  Administered 2011-05-30: 10 mg via ORAL
  Filled 2011-05-30: qty 1

## 2011-05-30 MED ORDER — DARBEPOETIN ALFA-POLYSORBATE 150 MCG/0.3ML IJ SOLN: 150.0000 ug | INTRAMUSCULAR | Status: DC

## 2011-05-30 NOTE — Progress Notes (Signed)
ANTICOAGULATION CONSULT NOTE - Follow Up Consult  Pharmacy Consult for Heparin Indication: atrial fibrillation   Patient Measurements: Height: 5' 6.5" (168.9 cm) Weight: 145 lb 11.6 oz (66.1 kg) IBW/kg (Calculated) : 60.45     Labs:  Basename 05/29/11 2336 05/29/11 1246 05/29/11 0810 05/29/11 0717 05/28/11 0950 05/27/11 0515  HGB -- -- 7.9* -- 8.8* --  HCT -- -- 26.2* -- 29.8* 31.6*  PLT -- -- 237 -- 215 197  APTT -- -- -- -- -- --  LABPROT -- -- 21.9* -- 17.0* 15.4*  INR -- -- 1.88* -- 1.36 1.19  HEPARINUNFRC 0.50 1.07* 1.54* -- -- --  CREATININE -- -- -- 5.97* -- --  CKTOTAL -- -- -- -- -- --  CKMB -- -- -- -- -- --  TROPONINI -- -- -- -- -- --     Medications:  Infusions:     . heparin 850 Units/hr (05/29/11 1549)  . DISCONTD: heparin 1,050 Units/hr (05/27/11 1630)    Assessment: 78 yo female on IV heparin for h/o atrial fibrillation. Heparin level (0.5) is at-goal on 850 units/hr.    Goal of Therapy:  Heparin level 0.3-0.7 units/ml   Plan:  1. Continue IV heparin at 850 units/hr.  2. Follow-up AM labs.   Emeline Gins 05/30/2011 12:15 AM

## 2011-05-30 NOTE — Progress Notes (Signed)
INFECTIOUS DISEASE PROGRESS NOTE    Date of Admission:  05/23/2011   Total days of antibiotics 8        Day 2 Ebony [name] This is a late entry, patient seen 3/19                         Meropenem 3/15-18        Vancomycin 3/13-16 Active Problems:  * No active hospital problems. *       . calcium acetate  667 mg Oral TID WC  . cefTAZidime (FORTAZ)  IV  2 g Intravenous Q T,Th,Sa-HD  . darbepoetin (ARANESP) injection - DIALYSIS  150 mcg Intravenous Q Thu-HD  . feeding supplement (NEPRO CARB STEADY)  237 mL Oral TID WC  . insulin aspart  0-15 Units Subcutaneous TID WC  . insulin aspart  0-5 Units Subcutaneous QHS  . multivitamin  1 tablet Oral QHS  . paricalcitol  2 mcg Intravenous 3 times weekly  . warfarin  10 mg Oral ONCE-1800  . warfarin  7.5 mg Oral ONCE-1800  . Warfarin - Pharmacist Dosing Inpatient   Does not apply q1800    Subjective:  Patient without complaints.  Objective: Temp:  [98.9 F (37.2 C)-99.1 F (37.3 C)] 99 F (37.2 C) (03/20 0559) Pulse Rate:  [61-101] 61  (03/20 0919) Resp:  [18-22] 18  (03/20 0919) BP: (107-119)/(61-65) 119/65 mmHg (03/20 0919) SpO2:  [93 %-99 %] 98 % (03/20 0919) Weight:  [146 lb 9.7 oz (66.5 kg)] 146 lb 9.7 oz (66.5 kg) (03/20 0500)  General: Awake, alert, no complaints, pleasant Skin: no rashes Lungs: CTA   Lab Results Lab Results  Component Value Date   WBC 6.0 05/29/2011   HGB 7.9* 05/29/2011   HCT 26.2* 05/29/2011   MCV 89.7 05/29/2011   PLT 237 05/29/2011    Lab Results  Component Value Date   CREATININE 5.97* 05/29/2011   BUN 24* 05/29/2011   NA 136 05/29/2011   K 3.7 05/29/2011   CL 99 05/29/2011   CO2 28 05/29/2011    Lab Results  Component Value Date   ALT 15 03/17/2011   AST 18 03/17/2011   ALKPHOS 196* 03/17/2011   BILITOT 0.2* 03/17/2011       Microbiology: Recent Results (from the past 240 hour(s))  WOUND CULTURE     Status: Normal   Collection Time   05/24/11  3:59 PM      Component Value Range Status Comment   Specimen Description WOUND GROIN RIGHT   Final    Special Requests PT ON CIPRO   Final    Gram Stain     Final    Value: ABUNDANT WBC PRESENT,BOTH PMN AND MONONUCLEAR     RARE SQUAMOUS EPITHELIAL CELLS PRESENT     NO ORGANISMS SEEN   Culture FEW KLEBSIELLA PNEUMONIAE   Final    Report Status 05/27/2011 FINAL   Final    Organism ID, Bacteria KLEBSIELLA PNEUMONIAE   Final   ANAEROBIC CULTURE     Status: Normal   Collection Time   05/24/11  3:59 PM      Component Value Range Status Comment   Specimen Description WOUND GROIN RIGHT   Final    Special Requests PT ON CIPRO   Final    Gram Stain     Final    Value: ABUNDANT WBC PRESENT,BOTH PMN AND MONONUCLEAR     RARE SQUAMOUS EPITHELIAL CELLS PRESENT  NO ORGANISMS SEEN   Culture NO ANAEROBES ISOLATED   Final    Report Status 05/30/2011 FINAL   Final   CATH TIP CULTURE     Status: Normal   Collection Time   05/24/11  4:33 PM      Component Value Range Status Comment   Specimen Description CATH TIP GROIN GRAFT   Final    Special Requests NONE   Final    Culture 7 COLONIES KLEBSIELLA PNEUMONIAE   Final    Report Status 05/29/2011 FINAL   Final     Studies/Results: No results found.   Assessment: Graft infection, s/p removal.  Plan: 1. Fortaz through 3/28.    I am available if needed.  Thanks

## 2011-05-30 NOTE — Progress Notes (Signed)
Utilization review completed. Cloey Sferrazza, RN, BSN. 05/30/11  

## 2011-05-30 NOTE — Progress Notes (Signed)
Subjective: Interval History: none. and has no complaint of feeling unwell, sitting in bed eating breakfast  Objective: Vital signs in last 24 hours:  Temp:  [98.1 F (36.7 C)-99.1 F (37.3 C)] 99 F (37.2 C) (03/20 0559) Pulse Rate:  [62-101] 80  (03/20 0559) Resp:  [16-23] 18  (03/20 0559) BP: (89-121)/(48-66) 108/64 mmHg (03/20 0559) SpO2:  [93 %-99 %] 96 % (03/20 0559) Weight:  [66.1 kg (145 lb 11.6 oz)-66.5 kg (146 lb 9.7 oz)] 66.5 kg (146 lb 9.7 oz) (03/20 0500)  Weight change: -1.033 kg (-2 lb 4.4 oz)  Intake/Output: I/O last 3 completed shifts: In: 1174.7 [P.O.:200; I.V.:974.7] Out: 1896 [Drains:85; Other:1811]   Intake/Output this shift:     General: stable, alert in no distress  Lungs: Clear bilat  Heart: regular  Abdomen: Soft, non-tender  Groin: right groin dressing in place  Ext- right thigh edematous, wound vac over groin wound  Dialysis Access: active right IJ HD cath   Lab Results:  [facility] 05/29/11 0810 05/28/11 0950  WBC 6.0 5.6  HGB 7.9* 8.8*  HCT 26.2* 29.8*  PLT 237 215   BMET  Basename 05/29/11 0717  NA 136  K 3.7  CL 99  CO2 28  GLUCOSE 96  BUN 24*  CREATININE 5.97*  CALCIUM 8.7  PHOS 4.2   LFT  Basename 05/29/11 0717  PROT --  ALBUMIN 2.0*  AST --  ALT --  ALKPHOS --  BILITOT --  BILIDIR --  IBILI --   PT/INR  Basename 05/30/11 0550 05/29/11 0810  LABPROT 21.7* 21.9*  INR 1.85* 1.88*   Hepatitis Panel No results found for this basename: HEPBSAG,HCVAB,HEPAIGM,HEPBIGM in the last 72 hours  Studies/Results: No results found.  I have reviewed the patient's current medications.  Assessment/Plan: 1. S/P removal infected R thigh AVG- klebsiella Sensitive to merepenum  2. ESRD-TTS: HD today, R IJ TDC, no perm access  3. Hypertension/volume - BP controlled  4. Anemia - Hb 7's, continue ESA and IV Fe marked drop  May need blood with dialysis tomorrow 5. Metabolic bone disease - Continue zemplar 2 corrected calcium  10.3 will follow 6. Nutrition -renal diet + supplements  7. A fib/Chronic coumadin - IV hep and coumadin per pharm  8. DM - SSI  9. Thrombocytopenia - HIT neg; she is getting heparin with HD as outpt  10. Hx CVA w/ left contractures  11. Hx removal infected LUA AVG Mar 2012 (MRSA) and removal of infected R forearm AVG Nov 2012 Fortaz to be given after dialysis      LOS: 7 Lajada Janes W @TODAY @8 :53 AM

## 2011-05-30 NOTE — Progress Notes (Addendum)
[facility]  Daily Progress Note  Assessment/Planning: POD #6 s/p R infected thigh AVG excision   Cont VAC dressing  Tsfr back to SNF once INR therapeutic, likely tomorrow  Subjective  - 6 Days Post-Op  No complaints  Objective Filed Vitals:   05/29/11 1413 05/29/11 2100 05/30/11 0500 05/30/11 0559  BP: 107/64 113/61  108/64  Pulse: 101 80  80  Temp: 98.9 F (37.2 C) 99.1 F (37.3 C)  99 F (37.2 C)  TempSrc: Oral Oral  Oral  Resp: 22 20  18   Height:      Weight:   146 lb 9.7 oz (66.5 kg)   SpO2: 99% 93%  96%    Intake/Output Summary (Last 24 hours) at 05/30/11 0835 Last data filed at 05/30/11 0600  Gross per 24 hour  Intake 234.48 ml  Output   1846 ml  Net -1611.52 ml    PULM  CTAB CV  RRR GI  soft, NTND VASC  R thigh incisions bdg, VAC adherent  Laboratory CBC    Component Value Date/Time   WBC 6.0 05/29/2011 0810   HGB 7.9* 05/29/2011 0810   HCT 26.2* 05/29/2011 0810   PLT 237 05/29/2011 0810    BMET    Component Value Date/Time   NA 136 05/29/2011 0717   K 3.7 05/29/2011 0717   CL 99 05/29/2011 0717   CO2 28 05/29/2011 0717   GLUCOSE 96 05/29/2011 0717   BUN 24* 05/29/2011 0717   CREATININE 5.97* 05/29/2011 0717   CALCIUM 8.7 05/29/2011 0717   CALCIUM 8.6 07/07/2009 1725   GFRNONAA 6* 05/29/2011 0717   GFRAA 7* 05/29/2011 0717    Leonides Sake, MD [facility] Office: [phone] Pager: [phone]  05/30/2011, 8:35 AM    Wound Vac changed. Wounds look good, clean and granulating in well.  continue wound vac changes SNF in am if INR therapeutic.

## 2011-05-30 NOTE — Progress Notes (Signed)
ANTICOAGULATION CONSULT NOTE - Follow Up Consult  Pharmacy Consult for:  Coumadin with Heparin bridging Indication: atrial fibrillation  Allergies  Allergen Reactions  . Aspirin     Unknown    . Heparin     SHE is HIT neg; platelets have been normal on low dose heparin at out pt dialysis ; not clear why this is listed   . Minoxidil     Unknown    . Penicillins     Unknown      Patient Measurements: Height: 5' 6.5" (168.9 cm) Weight: 146 lb 9.7 oz (66.5 kg) IBW/kg (Calculated) : 60.45    Vital Signs: Temp: 99 F (37.2 C) (03/20 0559) Temp src: Oral (03/20 0559) BP: 119/65 mmHg (03/20 0919) Pulse Rate: 61  (03/20 0919)  Labs:  Alvira Philips 05/30/11 0550 05/29/11 2336 05/29/11 1246 05/29/11 0810 05/29/11 0717 05/28/11 0950  HGB -- -- -- 7.9* -- 8.8*  HCT -- -- -- 26.2* -- 29.8*  PLT -- -- -- 237 -- 215  APTT -- -- -- -- -- --  LABPROT 21.7* -- -- 21.9* -- 17.0*  INR 1.85* -- -- 1.88* -- 1.36  HEPARINUNFRC 0.38 0.50 1.07* -- -- --  CREATININE -- -- -- -- 5.97* --  CKTOTAL -- -- -- -- -- --  CKMB -- -- -- -- -- --  TROPONINI -- -- -- -- -- --   Estimated Creatinine Clearance: 7.3 ml/min (by C-G formula based on Cr of 5.97).   Medications:  Scheduled:    . calcium acetate  667 mg Oral TID WC  . cefTAZidime (FORTAZ)  IV  2 g Intravenous Q T,Th,Sa-HD  . darbepoetin (ARANESP) injection - DIALYSIS  150 mcg Intravenous Q Thu-HD  . feeding supplement (NEPRO CARB STEADY)  237 mL Oral TID WC  . insulin aspart  0-15 Units Subcutaneous TID WC  . insulin aspart  0-5 Units Subcutaneous QHS  . multivitamin  1 tablet Oral QHS  . paricalcitol  2 mcg Intravenous 3 times weekly  . warfarin  7.5 mg Oral ONCE-1800  . Warfarin - Pharmacist Dosing Inpatient   Does not apply q1800   Infusions:    . heparin 850 Units/hr (05/29/11 1549)  . DISCONTD: heparin 1,050 Units/hr (05/27/11 1630)    Assessment:  Patient is a 78 y/o female with ESRD and a history of A-fibrillation and  on chronic Coumadin at home  S/p surgery  for removal of infected R thigh AVG--INR reversed by Vit K.  Coumadin restarted post-op 3/16.  Current INR 1.85.  Heparin rate = 850 units/hr,  Heparin level 0.38, within therapeutic range  Antibiotics changed to Ceftazidime  Goal of Therapy:   INR 2-3  Heparin level 0.3-0.7 units/ml   Plan:   Continue Heparin at 850 units/hr.  Coumadin 10 mg x 1.  Auston Halfmann, Elisha Headland, Pharm.D. 05/30/2011 12:02 PM

## 2011-05-30 NOTE — Consult Note (Signed)
Wound care follow-up:  Vac dressing change to right groin and right leg wounds.  VVS PA in to assess sites.  Right groin beefy red, unchanged in size.  One piece black sponge applied to 125cm con't suction.  Inner thigh wound surrounded by staples, applied one piece black sponge.  Unchanged in size and large amt clotted blood to wound bed.  Right thigh with same appearance and clotted blood. One piece black sponge applied to site, bridged together with other thigh wound.  All sites connected to vac tubing with Y connector and con't suction on at 125cm.  All three incisions very painful after meds given earlier, indurated to wound edges.  Mod bloody drainage in cannister. Plan dressing change Friday. Will not plan to follow further unless re-consulted.  [address] RN, MSN, [employer]  [phone]

## 2011-05-31 ENCOUNTER — Inpatient Hospital Stay (HOSPITAL_COMMUNITY): Payer: Medicare Other

## 2011-05-31 LAB — PROTIME-INR
INR: 2.46 — ABNORMAL HIGH (ref 0.00–1.49)
Prothrombin Time: 27.1 seconds — ABNORMAL HIGH (ref 11.6–15.2)

## 2011-05-31 LAB — GLUCOSE, CAPILLARY
Glucose-Capillary: 189 mg/dL — ABNORMAL HIGH (ref 70–99)
Glucose-Capillary: 136 mg/dL — ABNORMAL HIGH (ref 70–99)

## 2011-05-31 LAB — PREPARE RBC (CROSSMATCH)

## 2011-05-31 LAB — CBC
Hemoglobin: 7.2 g/dL — ABNORMAL LOW (ref 12.0–15.0)
RBC: 2.67 MIL/uL — ABNORMAL LOW (ref 3.87–5.11)

## 2011-05-31 LAB — RENAL FUNCTION PANEL
CO2: 27 mEq/L (ref 19–32)
GFR calc Af Amer: 8 mL/min — ABNORMAL LOW (ref >90)
Glucose, Bld: 178 mg/dL — ABNORMAL HIGH (ref 70–99)
Potassium: 3.7 mEq/L (ref 3.5–5.1)
Sodium: 136 mEq/L (ref 135–145)

## 2011-05-31 MED ORDER — PARICALCITOL 5 MCG/ML IV SOLN
INTRAVENOUS | Status: AC
  Administered 2011-05-31: 2 ug via INTRAVENOUS
  Filled 2011-05-31: qty 1

## 2011-05-31 MED ORDER — WARFARIN SODIUM 5 MG PO TABS
5.0000 mg | ORAL_TABLET | Freq: Once | ORAL | Status: AC
  Administered 2011-05-31: 5 mg via ORAL
  Filled 2011-05-31: qty 1

## 2011-05-31 MED ORDER — ACETAMINOPHEN 325 MG PO TABS: 325.0000 mg | ORAL_TABLET | ORAL | Status: DC | PRN

## 2011-05-31 MED ORDER — OXYCODONE-ACETAMINOPHEN 5-325 MG PO TABS: 1.0000 | ORAL_TABLET | ORAL | Status: DC | PRN

## 2011-05-31 MED ORDER — NEPRO/CARBSTEADY PO LIQD: 237.0000 mL | ORAL | Status: DC | PRN

## 2011-05-31 MED ORDER — OXYCODONE HCL 5 MG PO TABS
ORAL_TABLET | ORAL | Status: AC
  Administered 2011-05-31: 10 mg via ORAL
  Filled 2011-05-31: qty 2

## 2011-05-31 NOTE — Progress Notes (Signed)
ABD pad applied to medial thigh, packed wet to dry dsg.  MD paged, now coming to see Pt.

## 2011-05-31 NOTE — Progress Notes (Signed)
Subjective: Interval History: feeling weak and not well   Objective: Vital signs in last 24 hours:  Temp:  [97.5 F (36.4 C)-98.7 F (37.1 C)] 97.5 F (36.4 C) (03/21 0645) Pulse Rate:  [61-79] 78  (03/21 0800) Resp:  [18-21] 21  (03/21 0645) BP: (102-122)/(50-65) 106/52 mmHg (03/21 0800) SpO2:  [93 %-98 %] 93 % (03/21 0645) Weight:  [68.1 kg (150 lb 2.1 oz)-68.7 kg (151 lb 7.3 oz)] 68.7 kg (151 lb 7.3 oz) (03/21 0645)  Weight change: 2 kg (4 lb 6.6 oz)  Intake/Output: I/O last 3 completed shifts: In: 678.5 [P.O.:240; I.V.:438.5] Out: 110 [Drains:110]   Intake/Output this shift:     General: stable, alert in no distress  Lungs: Clear bilat  Heart: regular  Abdomen: Soft, non-tender  Groin: right groin dressing in place  Ext- right thigh edematous, wound vac over groin wound  Dialysis Access: active right IJ HD cath   Lab Results:  Basename 05/31/11 0712 05/29/11 0810 05/28/11 0950  WBC 4.3 6.0 5.6  HGB 7.2* 7.9* 8.8*  HCT 23.9* 26.2* 29.8*  PLT 234 237 215   BMET  Basename 05/31/11 0732 05/29/11 0717  NA 136 136  K 3.7 3.7  CL 99 99  CO2 27 28  GLUCOSE 178* 96  BUN 21 24*  CREATININE 5.19* 5.97*  CALCIUM 8.7 8.7  PHOS 4.0 4.2   LFT  Basename 05/31/11 0732  PROT --  ALBUMIN 2.1*  AST --  ALT --  ALKPHOS --  BILITOT --  BILIDIR --  IBILI --   PT/INR  Basename 05/31/11 0530 05/30/11 0550  LABPROT 27.1* 21.7*  INR 2.46* 1.85*   Hepatitis Panel No results found for this basename: HEPBSAG,HCVAB,HEPAIGM,HEPBIGM in the last 72 hours  Studies/Results: No results found.  I have reviewed the patient's current medications.  Assessment/Plan: 1. S/P removal infected R thigh AVG- klebsiella Sensitive to fortaz 2. ESRD-TTS: HD today, R IJ TDC, no perm access  3. Hypertension/volume - BP controlled  4. Anemia - Hb 7's, continue ESA and IV Fe marked drop May need blood with dialysis tomorrow will have primary team coordinate transfusion 5.  Metabolic bone disease - Continue zemplar 2 corrected calcium 10.2 will follow  6. Nutrition -renal diet + supplements  7. A fib/Chronic coumadin -coumadin per pharm stopped heparin 8. DM - SSI  9. Thrombocytopenia - HIT neg; she is getting heparin with HD as outpt  10. Hx CVA w/ left contractures  11. Hx removal infected LUA AVG Mar 2012 (MRSA) and removal of infected R forearm AVG Nov 2012 Fortaz to be given after dialysis      LOS: 8 Ebony [name] W @TODAY @9 :03 AM

## 2011-05-31 NOTE — Progress Notes (Signed)
ANTICOAGULATION CONSULT NOTE - Follow Up Consult  Pharmacy Consult for Heparin-->Coumadin Indication: atrial fibrillation  Allergies  Allergen Reactions  . Aspirin     Unknown    . Heparin     SHE is HIT neg; platelets have been normal on low dose heparin at out pt dialysis ; not clear why this is listed   . Minoxidil     Unknown    . Penicillins     Unknown     Vital Signs: Temp: 97.5 F (36.4 C) (03/21 0645) Temp src: Oral (03/21 0645) BP: 107/52 mmHg (03/21 0730) Pulse Rate: 79  (03/21 0730)  Labs:  Alvira Philips 05/31/11 0732 05/31/11 4098 05/31/11 0530 05/30/11 0550 05/29/11 2336 05/29/11 0810 05/29/11 0717 05/28/11 0950  HGB -- 7.2* -- -- -- 7.9* -- --  HCT -- 23.9* -- -- -- 26.2* -- 29.8*  PLT -- 234 -- -- -- 237 -- 215  APTT -- -- -- -- -- -- -- --  LABPROT -- -- 27.1* 21.7* -- 21.9* -- --  INR -- -- 2.46* 1.85* -- 1.88* -- --  HEPARINUNFRC -- -- 0.39 0.38 0.50 -- -- --  CREATININE 5.19* -- -- -- -- -- 5.97* --  CKTOTAL -- -- -- -- -- -- -- --  CKMB -- -- -- -- -- -- -- --  TROPONINI -- -- -- -- -- -- -- --   Estimated Creatinine Clearance: 8.4 ml/min (by C-G formula based on Cr of 5.19).  Medications:  Heparin @ 850 units/hr  Assessment: 81yof continues on heparin to coumadin bridge for afib. Heparin level is therapeutic. INR with significant increase and is now therapeutic. No bleeding noted per chart notes. Will try to resume home coumadin dose (5mg  daily) today.   Goal of Therapy:  INR 2-3 Heparin level 0.3-0.7   Plan:  1) Coumadin 5mg  x 1 2) Follow up INR in AM 3) d/c heparin as INR is >2  Fredrik Rigger 05/31/2011,8:16 AM

## 2011-05-31 NOTE — Progress Notes (Signed)
CSW is continuing to follow to facilitate discharge to SNF. [facility] is ready to accept pt when medically ready. CSW will continue to follow.   Dede Query, MSW, Theresia Majors [phone]

## 2011-05-31 NOTE — Progress Notes (Signed)
Vac DC'd from medial right thigh by MD, changed to packed wet to dry, now saturated.  RN attempted to change, packing appears very deep and tight, acute blood.  Top dry 4x4 replaced, PA paged.

## 2011-05-31 NOTE — Discharge Summary (Signed)
[facility] Discharge Summary   Patient ID:  Ebony [name] MRN: [id number] DOB/AGE: 1929-04-10 78 y.o.  Admit date: 05/23/2011 Discharge date: 05/31/2011 Date of Surgery: 05/23/2011 - 05/24/2011 Surgeon: Moishe Spice): Fransisco Hertz, MD  Admission Diagnosis: infection right groin wound status post ligation right thigh AVG  Discharge Diagnoses:  infection right groin wound status post ligation right thigh AVG  Secondary Diagnoses: Past Medical History  Diagnosis Date  . Atrial fibrillation   . CAD (coronary artery disease)   . Hypertension   . Anemia   . MRSA bacteremia     hx  . Cellulitis   . GERD (gastroesophageal reflux disease)   . Acetabulum fracture   . Contracture of elbow     L arm  . Dementia   . Diabetes mellitus     on Insulin  . Decubitus ulcer, buttock 02/07/2011    Pt presents with dressing to R buttocks/coccyx.  . Pneumonia   . Blood transfusion   . Stroke ~ 1990's    Left side paralysis.  Left  foot drop., L arm contracted.  . Chronic kidney disease 03/13/11    dialysis at [facility]; TTS,last tx 03/12/11 hemo x 20years    Procedure(s): REMOVAL OF ARTERIOVENOUS GORETEX GRAFT (AVGG) APPLICATION OF WOUND VAC  Discharged Condition: good  HPI: Ebony [name] is a 78 y.o. female who presents for evaluation of right leg swelling. She has a known right thigh graft with recent angioplasty of the venous outflow in January. She has some dementia but states that the swelling has been present for a few days.  She is admitted for removal of right thigh AVGG.   Hospital Course:  Ebony [name] is a 78 y.o. female is S/P Right Procedure(s): REMOVAL OF ARTERIOVENOUS GORETEX GRAFT (AVGG) APPLICATION OF WOUND VAC Extubated: POD # 0 Post-op wounds healing well Pt. Ambulating, voiding and taking PO diet without difficulty. Pt pain controlled with PO pain meds. Labs as below Complications  :klebsiella infection in wound. -on [place]  with dialysis through 3/28  Some post -op bleeding requiring transfusion.  Coumadin resumed  Consults:  Treatment Team:  Maree Krabbe, MD Dr. Luciana Axe - infectious disease  Significant Diagnostic Studies: Pre transfusion Lab Results  Component Value Date   WBC 6.7 06/01/2011   HGB 10.8* 06/01/2011   HCT 34.5* 06/01/2011   MCV 87.8 06/01/2011   PLT 228 06/01/2011     BMET    Component Value Date/Time   NA 136 05/31/2011 0732   K 3.7 05/31/2011 0732   CL 99 05/31/2011 0732   CO2 27 05/31/2011 0732   GLUCOSE 178* 05/31/2011 0732   BUN 21 05/31/2011 0732   CREATININE 5.19* 05/31/2011 0732   CALCIUM 8.7 05/31/2011 0732   CALCIUM 8.6 07/07/2009 1725   GFRNONAA 7* 05/31/2011 0732   GFRAA 8* 05/31/2011 0732   COAG Lab Results  Component Value Date   INR 2.46* 05/31/2011   INR 1.85* 05/30/2011   INR 1.88* 05/29/2011     Disposition:  Discharge to :Skilled nursing facility Discharge Orders    Future Orders Please Complete By Expires   Resume previous diet      Call MD for:  temperature >100.5      Call MD for:  redness, tenderness, or signs of infection (pain, swelling, bleeding, redness, odor or green/yellow discharge around incision site)      Call MD for:  severe or increased pain, loss or decreased feeling  in affected  limb(s)      Increase activity slowly      Comments:   Walk with assistance use walker or cane as needed   Change dressing (specify)      Comments:   Dressing change: wound vac to right thigh open wounds every M,W,F      Ebony [name], Ebony [name]  Home Medication Instructions [id number]   Printed on:05/31/11 1405  Medication Information                    insulin glargine (LANTUS) 100 UNIT/ML injection Inject 5 Units into the skin at bedtime.            omeprazole (PRILOSEC) 20 MG capsule Take 20 mg by mouth daily.            docusate sodium (COLACE) 100 MG capsule Take 100 mg by mouth 2 (two) times daily.            simvastatin (ZOCOR) 20 MG  tablet Take 20 mg by mouth at bedtime.            calcium acetate (PHOSLO) 667 MG capsule Take 667 mg by mouth 3 (three) times daily with meals.            lidocaine-prilocaine (EMLA) cream Apply 1 application topically 3 (three) times a week. Tues, thurs, and sat 30 mins before dialysis           bisacodyl (DULCOLAX) 5 MG EC tablet Take 10 mg by mouth daily as needed. For constipation           multivitamin (RENA-VIT) TABS tablet Take 1 tablet by mouth at bedtime.           calcium carbonate, dosed in mg elemental calcium, 1250 MG/5ML Take 500 mg of elemental calcium by mouth every 6 (six) hours as needed. For acid reflux           darbepoetin (ARANESP) 200 MCG/0.4ML SOLN Inject 200 mcg into the skin every 7 (seven) days. Takes on Thursdays.           risperiDONE (RISPERDAL) 0.25 MG tablet Take 0.25 mg by mouth at bedtime.            paricalcitol (ZEMPLAR) 5 MCG/ML injection Inject 0.2 mLs (1 mcg total) into the vein 3 (three) times a week.           DULoxetine (CYMBALTA) 20 MG capsule Take 20 mg by mouth daily.           feeding supplement (PRO-STAT SUGAR FREE 64) LIQD Take 30 mLs by mouth 2 (two) times daily with a meal.            amLODipine (NORVASC) 5 MG tablet Take 5 mg by mouth at bedtime.           Nutritional Supplements (FEEDING SUPPLEMENT, NEPRO CARB STEADY,) LIQD Take 237 mLs by mouth 3 (three) times daily with meals.           warfarin (COUMADIN) 5 MG tablet Take 5 mg by mouth daily.           metoprolol tartrate (LOPRESSOR) 25 MG tablet Take 25-50 mg by mouth See admin instructions. 1 tab twice daily on Sun, Mon., Wed., Fri.; 1 tab daily on Tues, Thurs, and Sat           dextromethorphan-guaiFENesin (ROBITUSSIN-DM) 10-100 MG/5ML liquid Take 10 mLs by mouth every 6 (six) hours as needed. For cough  acetaminophen (TYLENOL) 325 MG tablet Take 1-2 tablets (325-650 mg total) by mouth every 4 (four) hours as needed (for pain score up to 6 or for  headache).           Nutritional Supplements (FEEDING SUPPLEMENT, NEPRO CARB STEADY,) LIQD Take 237 mLs by mouth as needed (missed meal during dialysis.).           oxyCODONE-acetaminophen (PERCOCET) 5-325 MG per tablet Take 1-2 tablets by mouth every 4 (four) hours as needed. For pain            Verbal and written Discharge instructions given to the patient. Wound care per Discharge AVS - wound vac Follow-up Information    Follow up with Nilda Simmer, MD in 2 weeks. (office will arrange - sent)    Contact information:   [address] [place] [address] [phone]          Signed: Marlowe Shores 05/31/2011, 2:05 PM  Addendum  I have independently interviewed and examined the patient, and I agree with the physician assistant's discharge summary.  This patient had right thigh graft excision with bovine patch angioplasty of the right common femoral artery.  She has been in the hospital since then for wound care including VAC to right groin and open incision over the prior tunnel of the graft.  She was diagnosed with a Kiebsella infection of the graft and per ID Fortaz after hemodialysis should be adequate for coverage.  She will continue with VAC dressing at her SNF.  Follow up in the office in 2 weeks for wound check.  Leonides Sake, MD [facility] of [place] Office: [phone] Pager: [phone]  06/01/2011, 6:33 PM

## 2011-05-31 NOTE — Procedures (Signed)
Patient seen on dialysis 

## 2011-05-31 NOTE — Progress Notes (Addendum)
VASCULAR AND VEIN SURGERY PROGRESS NOTE  POST-OP HEMODIALYSIS ACCESS  Date of Surgery: 05/23/2011 - 05/24/2011 Surgeon: Moishe Spice): Fransisco Hertz, MD 7 Days Post-Op Right Procedure(s): REMOVAL OF ARTERIOVENOUS GORETEX GRAFT (AVGG) APPLICATION OF WOUND VAC   HPI: Ebony [name] is a 78 y.o. female who is 7 Days Post-Op removal of right lower extremity Hemodialysis access. The patient denies symptoms of numbness, tingling, weakness; denies pain in the operative limb.   Significant Diagnostic Studies: CBC Lab Results  Component Value Date   WBC 4.3 05/31/2011   HGB 7.2* 05/31/2011   HCT 23.9* 05/31/2011   MCV 89.5 05/31/2011   PLT 234 05/31/2011    BMET    Component Value Date/Time   NA 136 05/31/2011 0732   K 3.7 05/31/2011 0732   CL 99 05/31/2011 0732   CO2 27 05/31/2011 0732   GLUCOSE 178* 05/31/2011 0732   BUN 21 05/31/2011 0732   CREATININE 5.19* 05/31/2011 0732   CALCIUM 8.7 05/31/2011 0732   CALCIUM 8.6 07/07/2009 1725   GFRNONAA 7* 05/31/2011 0732   GFRAA 8* 05/31/2011 0732    COAG Lab Results  Component Value Date   INR 2.46* 05/31/2011   INR 1.85* 05/30/2011   INR 1.88* 05/29/2011   No results found for this basename: PTT     I/O last 3 completed shifts: In: 678.5 [P.O.:240; I.V.:438.5] Out: 110 [Drains:110]  Physical Examination  Patient Vitals for the past 24 hrs:  BP Temp Temp src Pulse Resp SpO2 Weight  05/31/11 1346 112/59 mmHg 97.4 F (36.3 C) Oral 79  18  95 % -  05/31/11 1239 122/79 mmHg 98.2 F (36.8 C) Oral 88  20  - -  05/31/11 1145 114/57 mmHg 97.8 F (36.6 C) Oral 80  20  98 % 145 lb 4.5 oz (65.9 kg)  05/31/11 1142 113/57 mmHg 97.8 F (36.6 C) Oral 82  18  - -  05/31/11 1130 107/59 mmHg 97.3 F (36.3 C) Oral 84  18  - -  05/31/11 1110 117/60 mmHg 97.8 F (36.6 C) Oral 88  16  - -  05/31/11 1100 108/58 mmHg - - 92  - - -  05/31/11 1055 101/57 mmHg 97.8 F (36.6 C) Oral 98  18  - -  05/31/11 1030 103/53 mmHg 98.2 F (36.8 C) Oral 83  18  -  -  05/31/11 1000 91/48 mmHg - - 78  - - -  05/31/11 0930 108/54 mmHg - - 79  - - -  05/31/11 0900 106/55 mmHg - - 79  - - -  05/31/11 0830 102/48 mmHg - - 75  - - -  05/31/11 0800 106/52 mmHg - - 78  - - -  05/31/11 0730 107/52 mmHg - - 79  - - -  05/31/11 0657 109/56 mmHg - - 63  - - -  05/31/11 0645 115/52 mmHg 97.5 F (36.4 C) Oral 68  21  93 % 151 lb 7.3 oz (68.7 kg)  05/31/11 0300 107/65 mmHg 98.7 F (37.1 C) Oral 71  18  96 % 150 lb 2.1 oz (68.1 kg)  05/30/11 1946 102/50 mmHg 98.4 F (36.9 C) Oral 65  18  94 % -  05/30/11 1414 122/60 mmHg 97.9 F (36.6 C) Oral 65  18  98 % -    right lower Incision is clean, dry, intact or healing well, with wound vac in place. Some bloody drainage in vac. Right thigh soft warm, non tender RLE well perfused  Assessment/Plan Ebony [name] is a 78 y.o. year old female who presents s/p removal of right lower extremity Hemodialysis access. Follow-up in 2 weeks Wound vac change q  M,W,F PT/INR Saturday on HD Heparin drip stopped INR 2.46 today.  Ebony [name],Ebony [name] 05/31/2011 2:13 PM  Addendum  I have independently interviewed and examined the patient, and I agree with the physician assistant's findings.  I took down the [facility] dressing.  The groin is healing well without any bleeding.  There remains some liquifying hematoma in all remaining incisions.  Can continue VAC dressing as outpatient once tsfr back to SNF.  Leonides Sake, MD [facility] [place] Office: [phone] Pager: [phone]  05/31/2011, 6:12 PM

## 2011-05-31 NOTE — Progress Notes (Deleted)
Wound VAC DC'd by MD, changed to packed wet to dry dsg.  Dsg saturated with blood, RN attempted to change but found packing too deep and tight.  Acute blood.  Top dry 4x4 replaced.   MD paged.

## 2011-06-01 ENCOUNTER — Encounter (HOSPITAL_COMMUNITY): Payer: Self-pay | Admitting: Vascular Surgery

## 2011-06-01 LAB — TYPE AND SCREEN: Antibody Screen: NEGATIVE

## 2011-06-01 LAB — GLUCOSE, CAPILLARY
Glucose-Capillary: 150 mg/dL — ABNORMAL HIGH (ref 70–99)
Glucose-Capillary: 182 mg/dL — ABNORMAL HIGH (ref 70–99)

## 2011-06-01 LAB — CBC
MCHC: 31.3 g/dL (ref 30.0–36.0)
Platelets: 228 10*3/uL (ref 150–400)
RDW: 20.3 % — ABNORMAL HIGH (ref 11.5–15.5)
WBC: 6.7 10*3/uL (ref 4.0–10.5)

## 2011-06-01 LAB — PROTIME-INR: Prothrombin Time: 26.4 seconds — ABNORMAL HIGH (ref 11.6–15.2)

## 2011-06-01 MED ORDER — WARFARIN SODIUM 5 MG PO TABS: 5.0000 mg | ORAL_TABLET | Freq: Every day | ORAL | Status: DC

## 2011-06-01 MED ORDER — WARFARIN SODIUM 5 MG PO TABS
5.0000 mg | ORAL_TABLET | Freq: Every day | ORAL | Status: DC
  Filled 2011-06-01: qty 1

## 2011-06-01 NOTE — Progress Notes (Signed)
Changed dressing on pt's rt leg. Moderate amount of Bleeding noted on old gauze.

## 2011-06-01 NOTE — Progress Notes (Signed)
Subjective: Interval History: none.  Objective: Vital signs in last 24 hours:  Temp:  [97.3 F (36.3 C)-99 F (37.2 C)] 98.6 F (37 C) (03/22 0529) Pulse Rate:  [63-98] 63  (03/22 0529) Resp:  [16-20] 16  (03/22 0529) BP: (91-122)/(48-79) 119/66 mmHg (03/22 0529) SpO2:  [95 %-98 %] 97 % (03/22 0529) Weight:  [65.9 kg (145 lb 4.5 oz)-67.2 kg (148 lb 2.4 oz)] 67.2 kg (148 lb 2.4 oz) (03/22 0529)  Weight change: -2.2 kg (-4 lb 13.6 oz)  Intake/Output: I/O last 3 completed shifts: In: 904 [I.V.:204; Blood:700] Out: 2175 [Drains:75; Other:2100]   Intake/Output this shift:     General: stable, alert in no distress  Lungs: Clear bilat  Heart: regular  Abdomen: Soft, non-tender  Groin: right groin dressing in place  Ext- right thigh edematous, wound vac over groin wound  Dialysis Access: active right IJ HD cath   Lab Results:  [facility] 05/31/11 0712  WBC 4.3  HGB 7.2*  HCT 23.9*  PLT 234   BMET  Basename 05/31/11 0732  NA 136  K 3.7  CL 99  CO2 27  GLUCOSE 178*  BUN 21  CREATININE 5.19*  CALCIUM 8.7  PHOS 4.0   LFT  Basename 05/31/11 0732  PROT --  ALBUMIN 2.1*  AST --  ALT --  ALKPHOS --  BILITOT --  BILIDIR --  IBILI --   PT/INR  Basename 06/01/11 0635 05/31/11 0530  LABPROT 26.4* 27.1*  INR 2.38* 2.46*   Hepatitis Panel No results found for this basename: HEPBSAG,HCVAB,HEPAIGM,HEPBIGM in the last 72 hours  Studies/Results: No results found.  I have reviewed the patient's current medications.  Assessment/Plan: 1. S/P removal infected R thigh AVG- klebsiella Sensitive to fortaz POD #9 2. ESRD-TTS: HD today, R IJ TDC, no perm access follow up [facility] 3-4 weeks 3. Hypertension/volume - BP controlled  4. Anemia - Hb 7's, continue ESA and IV Fe marked  5. Metabolic bone disease - Continue zemplar 2 corrected calcium 10.2 will follow  6. Nutrition -renal diet + supplements  7. A fib/Chronic coumadin -coumadin INR 2.38 8. DM - SSI  9.  Thrombocytopenia - HIT neg 10. Hx CVA w/ left contractures  11. Hx removal infected LUA AVG Mar 2012 (MRSA) and removal of infected R forearm AVG Nov 2012 Fortaz to be given after dialysis. Continue  until 3/28      LOS: 9 Raegyn Renda W @TODAY @8 :26 AM

## 2011-06-01 NOTE — Progress Notes (Signed)
Clinical Social Worker facilitated patient discharge including contacting facility and arranging ambulance transport for return back to [facility].  Per patient request, no family/friends contacted about discharge.  Patient has personal wheelchair at the hospital that can not be transported by [facility].  Patient with no family to transport - CSW to deliver patient wheelchair to facility at the end of the day.  Clinical Social Worker will sign off for now as social work intervention is no longer needed. Please consult [place] again if new need arises.  [address] [place], [place] [phone]

## 2011-06-01 NOTE — Progress Notes (Signed)
ANTICOAGULATION CONSULT NOTE - Follow Up Consult  Pharmacy Consult for Coumadin Indication: atrial fibrillation, h/o stroke  Allergies  Allergen Reactions  . Aspirin     Unknown    . Heparin     SHE is HIT neg; platelets have been normal on low dose heparin at out pt dialysis ; not clear why this is listed   . Minoxidil     Unknown    . Penicillins     Unknown      Patient Measurements: Height: 5' 6.5" (168.9 cm) Weight: 148 lb 2.4 oz (67.2 kg) IBW/kg (Calculated) : 60.45   Vital Signs: Temp: 98.6 F (37 C) (03/22 0529) Temp src: Oral (03/22 0529) BP: 119/66 mmHg (03/22 0529) Pulse Rate: 63  (03/22 0529)  Labs:  Alvira Philips 06/01/11 1610 05/31/11 0732 05/31/11 9604 05/31/11 0530 05/30/11 0550 05/29/11 2336  HGB -- -- 7.2* -- -- --  HCT -- -- 23.9* -- -- --  PLT -- -- 234 -- -- --  APTT -- -- -- -- -- --  LABPROT 26.4* -- -- 27.1* 21.7* --  INR 2.38* -- -- 2.46* 1.85* --  HEPARINUNFRC -- -- -- 0.39 0.38 0.50  CREATININE -- 5.19* -- -- -- --  CKTOTAL -- -- -- -- -- --  CKMB -- -- -- -- -- --  TROPONINI -- -- -- -- -- --   Estimated Creatinine Clearance: 8.4 ml/min (by C-G formula based on Cr of 5.19).   Medications:  Scheduled:    . calcium acetate  667 mg Oral TID WC  . cefTAZidime (FORTAZ)  IV  2 g Intravenous Q T,Th,Sa-HD  . darbepoetin (ARANESP) injection - DIALYSIS  150 mcg Intravenous Q Sat-HD  . feeding supplement (NEPRO CARB STEADY)  237 mL Oral TID WC  . insulin aspart  0-15 Units Subcutaneous TID WC  . insulin aspart  0-5 Units Subcutaneous QHS  . multivitamin  1 tablet Oral QHS  . paricalcitol  2 mcg Intravenous 3 times weekly  . warfarin  5 mg Oral ONCE-1800  . Warfarin - Pharmacist Dosing Inpatient   Does not apply q1800    Assessment: 78yo female with A.Fib, h/o stroke s/p AVG graft removal 2nd infection.  Heparin bridge completed with resumed Coumadin & therapeutic INR.  INR = 2.38 this AM.  No bleeding problems noted.  INR therapeutic on  admit.  Goal of Therapy:  INR 2-3   Plan:  1.  Resume home dose of Coumadin 5mg  daily 2.  Continue daily INR  Marisue Humble, PharmD Clinical Pharmacist [facility]

## 2011-06-01 NOTE — Progress Notes (Addendum)
[facility] of [place]  Daily Progress Note  Assessment/Planning: POD #8 s/p R infected thigh AVG excision   Cont VAC once trsf to SNF  Follow in office in 2-3 weeks  INR therapeutic  I believe draining from tract is liquifying hematoma not active bleeding  Ok to tsfr as long as CBC this AM stable  Subjective  - 8 Days Post-Op  No complaints, feeling better since transfusions  Objective Filed Vitals:   05/31/11 1239 05/31/11 1346 05/31/11 2017 06/01/11 0529  BP: 122/79 112/59 98/50 119/66  Pulse: 88 79 70 63  Temp: 98.2 F (36.8 C) 97.4 F (36.3 C) 99 F (37.2 C) 98.6 F (37 C)  TempSrc: Oral Oral Oral Oral  Resp: 20 18 18 16   Height:      Weight:    148 lb 2.4 oz (67.2 kg)  SpO2:  95% 96% 97%    Intake/Output Summary (Last 24 hours) at 06/01/11 0811 Last data filed at 05/31/11 1145  Gross per 24 hour  Intake    700 ml  Output   2100 ml  Net  -1400 ml    PULM  CTAB CV  RRR GI  soft, NTND VASC  R thigh bdg, warm foot  Laboratory CBC    Component Value Date/Time   WBC 4.3 05/31/2011 0712   HGB 7.2* 05/31/2011 0712   HCT 23.9* 05/31/2011 0712   PLT 234 05/31/2011 0712   CBC pending  BMET    Component Value Date/Time   NA 136 05/31/2011 0732   K 3.7 05/31/2011 0732   CL 99 05/31/2011 0732   CO2 27 05/31/2011 0732   GLUCOSE 178* 05/31/2011 0732   BUN 21 05/31/2011 0732   CREATININE 5.19* 05/31/2011 0732   CALCIUM 8.7 05/31/2011 0732   CALCIUM 8.6 07/07/2009 1725   GFRNONAA 7* 05/31/2011 0732   GFRAA 8* 05/31/2011 0732    Leonides Sake, MD [facility] [place] Office: [phone] Pager: [phone]  06/01/2011, 8:11 AM   Results for Ebony, [name] (MRN [id number]) as of 06/01/2011 12:52  Ref. Range 06/01/2011 12:10  WBC Latest Range: 4.0-10.5 K/uL 6.7  RBC Latest Range: 3.87-5.11 MIL/uL 3.93  HGB Latest Range: 12.0-15.0 g/dL 49.7 (L)  HCT Latest Range: 36.0-46.0 % 34.5 (L)  Platelets Latest Range:  150-400 K/uL 228   To SNF today Wound Vac Q M,W,F

## 2011-06-01 NOTE — Progress Notes (Signed)
Removed IV, pt tolerated well. EMS called and on way to transport pt.

## 2011-06-01 NOTE — Consult Note (Addendum)
Wound care follow-up: Pt followed by VVS.  They have D/Ced vac dressings according to progress notes and wet to dry applied. Progress notes indicate vac will be resumed at SNF. Will not plan to follow further unless re-consulted.  [address], RN, MSN, [employer]  [phone]

## 2011-06-04 MED FILL — Thrombin For Soln Kit 20000 Unit: CUTANEOUS | Qty: 1 | Status: AC

## 2011-06-04 MED FILL — Sodium Chloride Irrigation Soln 0.9%: Qty: 3000 | Status: AC

## 2011-06-05 NOTE — Progress Notes (Signed)
LATE ENTRY  Clinical Social Work Department CLINICAL SOCIAL WORK PLACEMENT NOTE 06/05/2011  Patient:  KATELYND, BLAUVELT  Account Number:  [id number] Admit date:  05/23/2011  Clinical Social Worker:  Peggyann Shoals  Date/time:  06/05/2011 09:00 AM  Clinical Social Work is seeking post-discharge placement for this patient at the following level of care:   SKILLED NURSING   (*CSW will update this form in Epic as items are completed)   05/28/2011  Patient/family provided with [facility] Department of Clinical Social Work's list of facilities offering this level of care within the geographic area requested by the patient (or if unable, by the patient's family).  05/28/2011  Patient/family informed of their freedom to choose among providers that offer the needed level of care, that participate in Medicare, Medicaid or managed care program needed by the patient, have an available bed and are willing to accept the patient.  05/28/2011  Patient/family informed of [facility]' ownership interest in [facility], as well as of the fact that they are under no obligation to receive care at this facility.  PASARR submitted to EDS on  PASARR number received from EDS on   FL2 transmitted to all facilities in geographic area requested by pt/family on  05/28/2011 FL2 transmitted to all facilities within larger geographic area on   Patient informed that his/her managed care company has contracts with or will negotiate with  certain facilities, including the following:     Patient/family informed of bed offers received:  05/28/2011 Patient chooses bed at [facility] [place] Physician recommends and patient chooses bed at  [facility] [place]  Patient to be transferred to [facility] [place] on  06/01/2011 Patient to be transferred to facility by [facility]  The following physician request were entered in Epic:   Additional Comments: Existing  PASARR

## 2011-06-14 ENCOUNTER — Encounter: Payer: Self-pay | Admitting: Vascular Surgery

## 2011-06-15 ENCOUNTER — Ambulatory Visit (INDEPENDENT_AMBULATORY_CARE_PROVIDER_SITE_OTHER): Payer: Medicare Other | Admitting: Vascular Surgery

## 2011-06-15 ENCOUNTER — Encounter: Payer: Self-pay | Admitting: Vascular Surgery

## 2011-06-15 VITALS — BP 144/67 | HR 49 | Temp 97.9°F | Ht 66.5 in | Wt 158.0 lb

## 2011-06-15 DIAGNOSIS — N186 End stage renal disease: Secondary | ICD-10-CM

## 2011-06-15 NOTE — Progress Notes (Signed)
[facility] [place]  Postoperative Access Visit  History of Present Illness  Ebony [name] is a 78 y.o. year old female who presents for postoperative follow-up for:   PROCEDURE (Date: 05/24/11). :  1. Excision of right thigh arteriovenous graft 2. Bovine patch angioplasty of the right common femoral artery  3. Placement of VAC dressing right groin  The patient's wounds are drainage dark blood.  The patient denies any fever or chills.  Physical Examination  Filed Vitals:   06/15/11 1239  BP: 144/67  Pulse: 49  Temp: 97.9 F (36.6 C)   RLE: Right femoral exposure wound healing well with good granulation, two open incision are healing with granulation, some dried clot present in one wound, stapled incisions are draining liquified hematoma, R foot is viable with intact motor and sensation  Medical Decision Making  Ebony [name] is a 78 y.o. year old female who presents s/p R thigh AVG excision.  This patient has liquified hematoma rather than active bleeding.  This is normal and expected.  I would VAC the femoral exposure wound and the two open wounds.  Continue IV abx during hemodialysis.  She will follow up in 3 weeks for wound check.  Thank you for allowing [place] to participate in this patient's care.  Leonides Sake, MD [facility] [place] Office: [phone] Pager: [phone]

## 2011-07-05 ENCOUNTER — Encounter: Payer: Self-pay | Admitting: Vascular Surgery

## 2011-07-06 ENCOUNTER — Encounter: Payer: Self-pay | Admitting: *Deleted

## 2011-07-06 ENCOUNTER — Ambulatory Visit: Payer: Medicare Other | Admitting: Vascular Surgery

## 2011-07-06 ENCOUNTER — Encounter: Payer: Self-pay | Admitting: Vascular Surgery

## 2011-07-06 ENCOUNTER — Ambulatory Visit (INDEPENDENT_AMBULATORY_CARE_PROVIDER_SITE_OTHER): Payer: Medicare Other | Admitting: Vascular Surgery

## 2011-07-06 VITALS — BP 157/91 | HR 50 | Temp 97.8°F | Ht 66.0 in | Wt 158.0 lb

## 2011-07-06 DIAGNOSIS — N186 End stage renal disease: Secondary | ICD-10-CM

## 2011-07-06 NOTE — Progress Notes (Signed)
[facility] [place]  Postoperative Access Visit  History of Present Illness  Ebony [name] is a 78 y.o. year old female who presents for postoperative follow-up for:   PROCEDURE (Date: 05/24/11). :  1. Excision of right thigh arteriovenous graft 2. Bovine patch angioplasty of the right common femoral artery  3. Placement of VAC dressing right groin  The patient's wounds nearly healed.  The patient denies any fever or chills.  Physical Examination  Filed Vitals:   07/06/11 1353  BP: 157/91  Pulse: 50  Temp: 97.8 F (36.6 C)   RLE: Incisions are mostly closed, with good granulation at a few incisions, no evidence of drainage or erythema  Medical Decision Making  Ebony [name] is a 78 y.o. year old female who presents s/p R thigh AVG excision and ESRD.  Now that the patient is mostly healed from his R thigh AVG infection and removal, I recommended we reinterrogate the upper arm venous system with B arm and central venogram to see if there is any remaining options.  The patient is reluctant to proceed with L thigh AVG given the complications from the previous one on the R.  A R side HeRO-graft might be an option.  I will need to review her complicated access history prior to deciding.  Thank you for allowing us to participate in this patient's care.  Sohil Timko, MD [facility] [place] Office: [phone] Pager: [phone]   

## 2011-07-09 ENCOUNTER — Other Ambulatory Visit: Payer: Self-pay | Admitting: *Deleted

## 2011-07-12 ENCOUNTER — Encounter (HOSPITAL_COMMUNITY): Payer: Self-pay | Admitting: Pharmacy Technician

## 2011-07-23 ENCOUNTER — Ambulatory Visit (HOSPITAL_COMMUNITY)
Admission: RE | Admit: 2011-07-23 | Discharge: 2011-07-23 | Disposition: A | Discharge status: Skilled Nursing Facility | Payer: Medicare Other | Source: Ambulatory Visit | Attending: Vascular Surgery | Admitting: Vascular Surgery

## 2011-07-23 ENCOUNTER — Telehealth: Payer: Self-pay | Admitting: Vascular Surgery

## 2011-07-23 ENCOUNTER — Encounter (HOSPITAL_COMMUNITY)
Admission: RE | Disposition: A | Discharge status: Skilled Nursing Facility | Payer: Self-pay | Source: Ambulatory Visit | Attending: Vascular Surgery

## 2011-07-23 DIAGNOSIS — Y831 Surgical operation with implant of artificial internal device as the cause of abnormal reaction of the patient, or of later complication, without mention of misadventure at the time of the procedure: Secondary | ICD-10-CM | POA: Insufficient documentation

## 2011-07-23 DIAGNOSIS — N186 End stage renal disease: Secondary | ICD-10-CM

## 2011-07-23 DIAGNOSIS — T82898A Other specified complication of vascular prosthetic devices, implants and grafts, initial encounter: Secondary | ICD-10-CM | POA: Insufficient documentation

## 2011-07-23 DIAGNOSIS — I82890 Acute embolism and thrombosis of other specified veins: Secondary | ICD-10-CM | POA: Insufficient documentation

## 2011-07-23 HISTORY — PX: VENOGRAM: SHX5497

## 2011-07-23 LAB — POCT I-STAT, CHEM 8
BUN: 45 mg/dL — ABNORMAL HIGH (ref 6–23)
Calcium, Ion: 1.2 mmol/L (ref 1.12–1.32)
Creatinine, Ser: 5.4 mg/dL — ABNORMAL HIGH (ref 0.50–1.10)
Glucose, Bld: 129 mg/dL — ABNORMAL HIGH (ref 70–99)
Hemoglobin: 16 g/dL — ABNORMAL HIGH (ref 12.0–15.0)
Sodium: 142 mEq/L (ref 135–145)
TCO2: 33 mmol/L (ref 0–100)

## 2011-07-23 LAB — GLUCOSE, CAPILLARY: Glucose-Capillary: 99 mg/dL (ref 70–99)

## 2011-07-23 SURGERY — VENOGRAM
Anesthesia: LOCAL

## 2011-07-23 MED ORDER — SODIUM CHLORIDE 0.9 % IJ SOLN: 3.0000 mL | INTRAMUSCULAR | Status: DC | PRN

## 2011-07-23 MED ORDER — ONDANSETRON HCL 4 MG/2ML IJ SOLN: 4.0000 mg | Freq: Four times a day (QID) | INTRAMUSCULAR | Status: DC | PRN

## 2011-07-23 MED ORDER — SODIUM CHLORIDE 0.9 % IJ SOLN: 3.0000 mL | Freq: Two times a day (BID) | INTRAMUSCULAR | Status: DC

## 2011-07-23 MED ORDER — ACETAMINOPHEN 325 MG PO TABS: 650.0000 mg | ORAL_TABLET | ORAL | Status: DC | PRN

## 2011-07-23 MED ORDER — SODIUM CHLORIDE 0.9 % IV SOLN: 250.0000 mL | INTRAVENOUS | Status: DC | PRN

## 2011-07-23 NOTE — Progress Notes (Signed)
Report given to [facility] facility, pt's transportation called to return pt to nursing facility.

## 2011-07-23 NOTE — H&P (View-Only) (Signed)
[facility] [place]  Postoperative Access Visit  History of Present Illness  LASHONA SCHAAF is a 78 y.o. year old female who presents for postoperative follow-up for:   PROCEDURE (Date: 05/24/11). :  1. Excision of right thigh arteriovenous graft 2. Bovine patch angioplasty of the right common femoral artery  3. Placement of VAC dressing right groin  The patient's wounds nearly healed.  The patient denies any fever or chills.  Physical Examination  Filed Vitals:   07/06/11 1353  BP: 157/91  Pulse: 50  Temp: 97.8 F (36.6 C)   RLE: Incisions are mostly closed, with good granulation at a few incisions, no evidence of drainage or erythema  Medical Decision Making  MARIKA MAHAFFY is a 78 y.o. year old female who presents s/p R thigh AVG excision and ESRD.  Now that the patient is mostly healed from his R thigh AVG infection and removal, I recommended we reinterrogate the upper arm venous system with B arm and central venogram to see if there is any remaining options.  The patient is reluctant to proceed with L thigh AVG given the complications from the previous one on the R.  A R side HeRO-graft might be an option.  I will need to review her complicated access history prior to deciding.  Thank you for allowing [place] to participate in this patient's care.  Leonides Sake, MD [facility] [place] Office: [phone] Pager: [phone]

## 2011-07-23 NOTE — Telephone Encounter (Addendum)
Message copied by Shari Prows on Mon Jul 23, 2011  4:26 PM ------      Message from: Melene Plan      Created: Mon Jul 23, 2011  3:47 PM                   ----- Message -----         From: Fransisco Hertz, MD         Sent: 07/23/2011   2:02 PM           To: Cydney Ok, Melene Plan, RN            Cloey Sferrazza Kun      [id number]      September 04, 1929            Procedure: R arm and central venogram            Follow-up: 2-4 wks      i scheduled an appt for this pt to see blc on Friday 08/24/11 at 9:15am. Notified Charmaine @ [name]:JWJX time/date. Drinda Butts tobin

## 2011-07-23 NOTE — Interval H&P Note (Signed)
[facility] [place]  History and Physical Update  The patient was interviewed and re-examined.  The patient's previous History and Physical has been reviewed and is unchanged.  There is no change in the plan of care: B arm and central venogram.  Leonides Sake, MD [facility] Office: [phone] Pager: [phone]  07/23/2011, 11:49 AM

## 2011-07-23 NOTE — Discharge Instructions (Signed)
Venogram A venogram is a procedure performed to look at the veins using X-ray and contrast (dye). Contrast helps the veins show up on X-rays. A venogram evaluates vein abnormalities, identifies clots within veins and evaluates swelling in a limb. LET YOUR CAREGIVER KNOW ABOUT:   All allergies, especially to medicines, shellfish, iodine and contrast.   All medications that you are taking, including prescription and over-the-counter medicines, dietary supplements, vitamins and herbal preparations.   Any previous complications from this or other procedures.   Any smoking history.   Any possibility of pregnancy.   Any history of bleeding problems.   Any other health problems, especially diabetes or kidney problems.  RISKS AND COMPLICATIONS  Risks of the procedure include:  Blood clots.   Kidney problems.   Allergic reaction to the contrast.   Bleeding.   Infection.   X-ray exposure.  BEFORE THE PROCEDURE   Take only the medications as directed by your caregiver. Take the medications with a small sip of water.   Stop eating and drinking as directed by your caregiver.   A blood sample may be drawn.   An intravenous (IV) line will be placed into a vein in your arm or leg.   You will be asked to remove your clothing and put on a hospital gown.   You may be asked to remove your watch and jewelry.  PROCEDURE   During the exam, you will lie on an x-ray table. The table may be tilted in different directions. Safety straps will keep you secure if the table is tilted.   Medicine may be given to help reduce pain or nervous feelings.   If the veins to be evaluated are in your arm or leg, a band may be wrapped around that limb to make the veins stay full of blood. You may feel like your arm or leg is going to sleep.   Contrast is injected into the vein. You may notice a hot, flushed feeling as the contrast is injected.   You may be asked to lie in different positions or place  your extremity in different positions.   When the IV is removed, pressure will be applied to prevent bleeding. A bandage may be applied.  AFTER THE PROCEDURE   You will be checked frequently after the exam.   You may be eat and drink.   You will be given extra fluids to clear the contrast from your body.  HOME CARE INSTRUCTIONS   Take all medications exactly as directed.   Follow any prescribed diet.   Follow instructions regarding both rest and physical activity.   Drink more fluids for the first several days after the procedure, in order to help flush dye from your kidneys.  SEEK IMMEDIATE MEDICAL CARE IF:   You have an oral temperature above 102 F (38.9 C), not controlled by medicine.   There is pain, drainage, bleeding, redness, swelling, warmth or a red streak at the site of the IV.   The extremity becomes discolored, numb or cool.   You have difficulty breathing or shortness of breath.   You develop chest pain.   You have excessive dizziness or fainting.  Document Released: 02/14/2009 Document Revised: 02/15/2011 Document Reviewed: 02/14/2009 Exit[employer] Patient Information 2012 [employer]. 

## 2011-07-23 NOTE — Op Note (Signed)
OPERATIVE NOTE   PROCEDURE: 1. right arm venogram   PRE-OPERATIVE DIAGNOSIS: end stage renal disease  POST-OPERATIVE DIAGNOSIS: same as above   SURGEON: Leonides Sake, MD  ANESTHESIA: local  ESTIMATED BLOOD LOSS: 5 cc  FINDING(S): 1. Patent right high brachial, axillary, and subclavian veins 2. Right central venous system occluded by tunneled dialysis catheter: chest wall veins evident and right internal jugular blood flows retrograde 3. Left axillary vein and distal subclavian vein stented  SPECIMEN(S):  None  CONTRAST: 40 cc  INDICATIONS: Ebony [name] is a 78 y.o. female who presents with end stage renal disease.  The patient is scheduled for bilateral venogram to help determine the availability of proximal veins for permanent access placement.  The patient is aware the risks include but are not limited to: bleeding, infection, thrombosis of the cannulated access, and possible anaphylactic reaction to the contrast.  The patient is aware of the risks of the procedure and elects to proceed forward.  DESCRIPTION: After full informed written consent was obtained, the patient was brought back to the angiography suite and placed supine upon the angiography table.  The patient was connected to monitoring equipment.  The right forearm IV was connected to IV extension tubing.  Hand injections were completed to image the arm veins and central venous structures, the findings of which are listed above.  Under fluoroscopy a thrombosed stents in the left axillary and distal subclavian vein were images, so I aborted the left arm and central venogram.  Based on these images, the patient's only remaining permanent dialysis access options included a left thigh arteriovenous graft and possibly a right HeRO-graft catheter.  COMPLICATIONS: none  CONDITION: stable  Leonides Sake, MD [facility] [place] Office: [phone] Pager: [phone]  07/23/2011 1:56 PM

## 2011-08-23 ENCOUNTER — Encounter: Payer: Self-pay | Admitting: Vascular Surgery

## 2011-08-24 ENCOUNTER — Encounter: Payer: Self-pay | Admitting: Vascular Surgery

## 2011-08-24 ENCOUNTER — Ambulatory Visit (INDEPENDENT_AMBULATORY_CARE_PROVIDER_SITE_OTHER): Payer: Medicare Other | Admitting: Vascular Surgery

## 2011-08-24 VITALS — BP 151/71 | HR 83 | Temp 98.3°F | Ht 66.0 in | Wt 158.0 lb

## 2011-08-24 DIAGNOSIS — N186 End stage renal disease: Secondary | ICD-10-CM

## 2011-08-24 NOTE — Progress Notes (Signed)
[facility] [place]  Established Dialysis Access  History of Present Illness  Ebony [name] is a 78 y.o. (10-04-1929) female who presents for re-evaluation for permanent access.  The patient is right hand dominant.  Previous access procedures have been completed in both arms and Right leg.  The patient's complication from previous access procedures include: thrombosis and infection.  The patient has never had a previous PPM placed.  The patient denies any previous steal sx in right arm.  She recently had a R arm and central venogram done on 07/23/11 which demonstrates compromise of central venous structures due to the [facility] obturating flow.  Past Medical History, Past Surgical History, Social History, Family History, Medications, Allergies, and Review of Systems are unchanged from previous visit on 07/23/11.  Physical Examination  Filed Vitals:   08/24/11 0938  BP: 151/71  Pulse: 83  Temp: 98.3 F (36.8 C)  TempSrc: Oral  Height: 5\' 6"  (1.676 m)  Weight: 158 lb (71.668 kg)  SpO2: 100%   Body mass index is 25.50 kg/(m^2).  General: A&O x 3, WDWN, cachectic  Pulmonary: Sym exp, good air movt, CTAB, no rales, rhonchi, & wheezing, RIJ TDC  Cardiac: RRR, Nl S1, S2, no Murmurs, rubs or gallops, palpable right radial and brachial pulses  Gastrointestinal: soft, NTND, -G/R, - HSM, - masses, - CVAT B  Musculoskeletal: M/S 5/5 throughout , Extremities without  ischemic changes , multiple healed incisions in R arm and R thigh  Neurologic: Pain and light touch intact in extremities , Motor exam as listed above  Medical Decision Making  [name] is a 78 y.o. female who presents with ESRD requiring hemodialysis.    We discussed the HeRO graft-catheter which is likely the only possible option other than a left thigh AVG.    Unfortunately, I'm not certain if the central venous catheter of the HeRO would pass successfully into the right atrium without  venoplasty.  Unfortunately, HeRO graft-catheter placement in this patient carries a risk of SVC rupture during venoplasty.   She has emphatically refused another thigh AVG given the complications she had with the right thigh AVG  At this point, she elects to continue hemodialysis via the [facility]  Leonides Sake, MD [facility] [place] Office: [phone] Pager: [phone]  08/24/2011, 1:24 PM

## 2011-09-27 ENCOUNTER — Encounter: Payer: Self-pay | Admitting: Vascular Surgery

## 2011-09-28 ENCOUNTER — Ambulatory Visit (INDEPENDENT_AMBULATORY_CARE_PROVIDER_SITE_OTHER): Payer: Medicare Other | Admitting: Vascular Surgery

## 2011-09-28 ENCOUNTER — Other Ambulatory Visit: Payer: Self-pay

## 2011-09-28 ENCOUNTER — Encounter: Payer: Self-pay | Admitting: Vascular Surgery

## 2011-09-28 VITALS — BP 134/69 | HR 61 | Temp 98.4°F | Ht 66.0 in | Wt 159.0 lb

## 2011-09-28 DIAGNOSIS — N186 End stage renal disease: Secondary | ICD-10-CM

## 2011-09-28 NOTE — Progress Notes (Signed)
[facility] [place]  Established Dialysis Access  History of Present Illness  Ebony [name] is a 78 y.o. ([birth year]) female who presents for evaluation of RIJ TDC.  By report, the patient has been getting antibiotics for RIJ [facility] for presumed catheter infection.  She denies any fever or chills.  She denies drainage from the the catheter site.  She continues to refuse any further access placement.  Past Medical History, Past Surgical History, Social History, Family History, Medications, Allergies, and Review of Systems are unchanged from previous visit on 07/23/11.  Physical Examination  Filed Vitals:   09/28/11 0907  BP: 134/69  Pulse: 61  Temp: 98.4 F (36.9 C)  TempSrc: Oral  Height: 5\' 6"  (1.676 m)  Weight: 159 lb (72.122 kg)  SpO2: 97%   Body mass index is 25.66 kg/(m^2).  General: A&O x 3, WDWN  Pulmonary: Sym exp, good air movt, CTAB, no rales, rhonchi, & wheezing, no evidence of cellulitis along catheter route, no drainage or erythema at the exit site  Cardiac: RRR, Nl S1, S2, no Murmurs, rubs or gallops  Medical Decision Making  Ebony [name] is a 78 y.o. female who presents with ESRD requiring hemodialysis.   The patient has stated on multiple visits: no further access placement  She has agreed to allow removal of the RIJ [facility] and placement of L femoral TDC  Leonides Sake, MD [facility] [place] Office: [phone] Pager: [phone]  09/28/2011, 9:28 AM

## 2011-10-02 ENCOUNTER — Encounter (HOSPITAL_COMMUNITY): Payer: Self-pay | Admitting: *Deleted

## 2011-10-02 MED ORDER — VANCOMYCIN HCL IN DEXTROSE 1-5 GM/200ML-% IV SOLN
1000.0000 mg | INTRAVENOUS | Status: AC
  Administered 2011-10-03: 1000 mg via INTRAVENOUS
  Filled 2011-10-02: qty 200

## 2011-10-03 ENCOUNTER — Encounter (HOSPITAL_COMMUNITY): Payer: Self-pay | Admitting: *Deleted

## 2011-10-03 ENCOUNTER — Ambulatory Visit (HOSPITAL_COMMUNITY)
Admission: RE | Admit: 2011-10-03 | Discharge: 2011-10-03 | Disposition: A | Discharge status: Home / Self Care | Payer: Medicare Other | Source: Ambulatory Visit | Attending: Vascular Surgery | Admitting: Vascular Surgery

## 2011-10-03 ENCOUNTER — Encounter (HOSPITAL_COMMUNITY)
Admission: RE | Disposition: A | Discharge status: Home / Self Care | Payer: Self-pay | Source: Ambulatory Visit | Attending: Vascular Surgery

## 2011-10-03 ENCOUNTER — Ambulatory Visit (HOSPITAL_COMMUNITY): Payer: Medicare Other | Admitting: Anesthesiology

## 2011-10-03 ENCOUNTER — Encounter (HOSPITAL_COMMUNITY): Payer: Self-pay | Admitting: Anesthesiology

## 2011-10-03 DIAGNOSIS — I120 Hypertensive chronic kidney disease with stage 5 chronic kidney disease or end stage renal disease: Secondary | ICD-10-CM | POA: Insufficient documentation

## 2011-10-03 DIAGNOSIS — N186 End stage renal disease: Secondary | ICD-10-CM | POA: Insufficient documentation

## 2011-10-03 DIAGNOSIS — Z992 Dependence on renal dialysis: Secondary | ICD-10-CM | POA: Insufficient documentation

## 2011-10-03 DIAGNOSIS — Z4901 Encounter for fitting and adjustment of extracorporeal dialysis catheter: Secondary | ICD-10-CM | POA: Insufficient documentation

## 2011-10-03 HISTORY — PX: INSERTION OF DIALYSIS CATHETER: SHX1324

## 2011-10-03 LAB — POCT I-STAT 4, (NA,K, GLUC, HGB,HCT)
Glucose, Bld: 118 mg/dL — ABNORMAL HIGH (ref 70–99)
Hemoglobin: 13.3 g/dL (ref 12.0–15.0)
Potassium: 3.8 mEq/L (ref 3.5–5.1)
Sodium: 140 mEq/L (ref 135–145)

## 2011-10-03 LAB — SURGICAL PCR SCREEN
MRSA, PCR: NEGATIVE
Staphylococcus aureus: NEGATIVE

## 2011-10-03 LAB — GLUCOSE, CAPILLARY
Glucose-Capillary: 118 mg/dL — ABNORMAL HIGH (ref 70–99)
Glucose-Capillary: 116 mg/dL — ABNORMAL HIGH (ref 70–99)

## 2011-10-03 SURGERY — INSERTION OF DIALYSIS CATHETER
Anesthesia: General | Site: Neck | Laterality: Right | Wound class: Clean

## 2011-10-03 MED ORDER — HYDROMORPHONE HCL PF 1 MG/ML IJ SOLN
0.2500 mg | INTRAMUSCULAR | Status: DC | PRN
Start: 2011-10-03 — End: 2011-10-03

## 2011-10-03 MED ORDER — BUPIVACAINE HCL (PF) 0.5 % IJ SOLN
INTRAMUSCULAR | Status: AC
  Filled 2011-10-03: qty 30

## 2011-10-03 MED ORDER — MUPIROCIN 2 % EX OINT
TOPICAL_OINTMENT | CUTANEOUS | Status: AC
  Filled 2011-10-03: qty 22

## 2011-10-03 MED ORDER — MIDAZOLAM HCL 5 MG/5ML IJ SOLN
INTRAMUSCULAR | Status: DC | PRN
  Administered 2011-10-03: 1 mg via INTRAVENOUS

## 2011-10-03 MED ORDER — DROPERIDOL 2.5 MG/ML IJ SOLN: 0.6250 mg | INTRAMUSCULAR | Status: DC | PRN

## 2011-10-03 MED ORDER — OXYCODONE HCL 5 MG PO TABS: 5.0000 mg | ORAL_TABLET | ORAL | Status: AC | PRN

## 2011-10-03 MED ORDER — MUPIROCIN 2 % EX OINT
TOPICAL_OINTMENT | Freq: Once | CUTANEOUS | Status: DC
  Filled 2011-10-03: qty 22

## 2011-10-03 MED ORDER — BUPIVACAINE HCL 0.5 % IJ SOLN
INTRAMUSCULAR | Status: DC | PRN
  Administered 2011-10-03: 24 mL

## 2011-10-03 MED ORDER — FENTANYL CITRATE 0.05 MG/ML IJ SOLN
INTRAMUSCULAR | Status: DC | PRN
  Administered 2011-10-03: 50 ug via INTRAVENOUS

## 2011-10-03 MED ORDER — EPHEDRINE SULFATE 50 MG/ML IJ SOLN
INTRAMUSCULAR | Status: DC | PRN
  Administered 2011-10-03: 10 mg via INTRAVENOUS

## 2011-10-03 MED ORDER — PROTAMINE SULFATE 10 MG/ML IV SOLN
30.0000 mg | Freq: Once | INTRAVENOUS | Status: AC
  Administered 2011-10-03: 30 mg via INTRAVENOUS
  Filled 2011-10-03: qty 3

## 2011-10-03 MED ORDER — SODIUM CHLORIDE 0.9 % IV SOLN
INTRAVENOUS | Status: DC | PRN
  Administered 2011-10-03: 08:00:00 via INTRAVENOUS

## 2011-10-03 MED ORDER — SODIUM CHLORIDE 0.9 % IR SOLN
Status: DC | PRN
  Administered 2011-10-03: 09:00:00

## 2011-10-03 MED ORDER — SODIUM CHLORIDE 0.9 % IR SOLN
Status: DC | PRN
  Administered 2011-10-03: 1000 mL

## 2011-10-03 MED ORDER — PROPOFOL 10 MG/ML IV BOLUS
INTRAVENOUS | Status: DC | PRN
  Administered 2011-10-03: 100 mg via INTRAVENOUS

## 2011-10-03 MED ORDER — HEPARIN SODIUM (PORCINE) 1000 UNIT/ML IJ SOLN
INTRAMUSCULAR | Status: DC | PRN
  Administered 2011-10-03: 7 mL

## 2011-10-03 MED ORDER — HEPARIN SODIUM (PORCINE) 1000 UNIT/ML IJ SOLN
INTRAMUSCULAR | Status: AC
  Filled 2011-10-03: qty 1

## 2011-10-03 SURGICAL SUPPLY — 46 items
BAG BANDED W/RUBBER/TAPE 36X54 (MISCELLANEOUS) ×6 IMPLANT
BAG DECANTER FOR FLEXI CONT (MISCELLANEOUS) ×3 IMPLANT
CATH CANNON HEMO 15F 50CM (CATHETERS) ×3 IMPLANT
CATH CANNON HEMO 15FR 19 (HEMODIALYSIS SUPPLIES) IMPLANT
CATH CANNON HEMO 15FR 23CM (HEMODIALYSIS SUPPLIES) IMPLANT
CATH CANNON HEMO 15FR 31CM (HEMODIALYSIS SUPPLIES) IMPLANT
CATH CANNON HEMO 15FR 32CM (HEMODIALYSIS SUPPLIES) IMPLANT
CLOTH BEACON ORANGE TIMEOUT ST (SAFETY) ×3 IMPLANT
CONT SPECI 4OZ STER CLIK (MISCELLANEOUS) ×3 IMPLANT
COVER PROBE W GEL 5X96 (DRAPES) ×3 IMPLANT
COVER SURGICAL LIGHT HANDLE (MISCELLANEOUS) ×3 IMPLANT
DERMABOND ADHESIVE PROPEN (GAUZE/BANDAGES/DRESSINGS) ×1
DERMABOND ADVANCED .7 DNX6 (GAUZE/BANDAGES/DRESSINGS) ×2 IMPLANT
DRAPE C-ARM 42X72 X-RAY (DRAPES) IMPLANT
DRAPE CHEST BREAST 15X10 FENES (DRAPES) ×3 IMPLANT
GAUZE SPONGE 2X2 8PLY STRL LF (GAUZE/BANDAGES/DRESSINGS) ×6 IMPLANT
GAUZE SPONGE 4X4 16PLY XRAY LF (GAUZE/BANDAGES/DRESSINGS) ×3 IMPLANT
GLOVE BIO SURGEON STRL SZ7 (GLOVE) ×6 IMPLANT
GLOVE BIOGEL PI IND STRL 7.0 (GLOVE) ×2 IMPLANT
GLOVE BIOGEL PI IND STRL 7.5 (GLOVE) ×2 IMPLANT
GLOVE BIOGEL PI INDICATOR 7.0 (GLOVE) ×1
GLOVE BIOGEL PI INDICATOR 7.5 (GLOVE) ×1
GLOVE ECLIPSE 7.5 STRL STRAW (GLOVE) ×3 IMPLANT
GOWN EXTRA PROTECTION XL (GOWNS) ×3 IMPLANT
GOWN STRL NON-REIN LRG LVL3 (GOWN DISPOSABLE) ×3 IMPLANT
KIT BASIN OR (CUSTOM PROCEDURE TRAY) ×3 IMPLANT
KIT ROOM TURNOVER OR (KITS) ×3 IMPLANT
NEEDLE 18GX1X1/2 (RX/OR ONLY) (NEEDLE) ×3 IMPLANT
NEEDLE HYPO 25GX1X1/2 BEV (NEEDLE) ×3 IMPLANT
NS IRRIG 1000ML POUR BTL (IV SOLUTION) ×3 IMPLANT
PACK SURGICAL SETUP 50X90 (CUSTOM PROCEDURE TRAY) ×3 IMPLANT
PAD ARMBOARD 7.5X6 YLW CONV (MISCELLANEOUS) ×6 IMPLANT
SOAP 2 % CHG 4 OZ (WOUND CARE) ×3 IMPLANT
SPONGE GAUZE 2X2 STER 10/PKG (GAUZE/BANDAGES/DRESSINGS) ×3
SUT ETHILON 3 0 PS 1 (SUTURE) ×3 IMPLANT
SUT MNCRL AB 4-0 PS2 18 (SUTURE) ×3 IMPLANT
SYR 20CC LL (SYRINGE) ×6 IMPLANT
SYR 30ML LL (SYRINGE) IMPLANT
SYR 3ML LL SCALE MARK (SYRINGE) ×3 IMPLANT
SYR 5ML LL (SYRINGE) ×3 IMPLANT
SYR CONTROL 10ML LL (SYRINGE) ×3 IMPLANT
SYRINGE 10CC LL (SYRINGE) ×3 IMPLANT
TAPE CLOTH SURG 4X10 WHT LF (GAUZE/BANDAGES/DRESSINGS) ×3 IMPLANT
TOWEL OR 17X24 6PK STRL BLUE (TOWEL DISPOSABLE) ×3 IMPLANT
TOWEL OR 17X26 10 PK STRL BLUE (TOWEL DISPOSABLE) ×3 IMPLANT
WATER STERILE IRR 1000ML POUR (IV SOLUTION) ×3 IMPLANT

## 2011-10-03 NOTE — Progress Notes (Signed)
Bleeding continues.  Manual pressure continues.  Attempted to reach any associates for Dr. Imogene Burn.  Told Dr. Imogene Burn will see patient when current case is done.

## 2011-10-03 NOTE — OR Nursing (Addendum)
Pressure held on Right neck for 8 minutes and 30 seconds, post removal of dialysis catheter per Dr. Imogene Burn by L. Twine, RCIS.

## 2011-10-03 NOTE — Transfer of Care (Signed)
Immediate Anesthesia Transfer of Care Note  Patient: Ebony [name]  Procedure(s) Performed: Procedure(s) (LRB): INSERTION OF DIALYSIS CATHETER (Left) REMOVAL OF A DIALYSIS CATHETER (Right)  Patient Location: PACU  Anesthesia Type: General  Level of Consciousness: sedated  Airway & Oxygen Therapy: Patient Spontanous Breathing and Patient connected to face mask oxygen  Post-op Assessment: Report given to PACU RN and Post -op Vital signs reviewed and stable  Post vital signs: Reviewed and stable  Complications: No apparent anesthesia complications

## 2011-10-03 NOTE — Progress Notes (Signed)
Dr. Imogene Burn at bedside. He applied manual pressure.  Order received for protimine 30 mg.

## 2011-10-03 NOTE — Anesthesia Preprocedure Evaluation (Addendum)
Anesthesia Evaluation  Patient identified by MRN, date of birth, ID band  Reviewed: Allergy & Precautions, H&P , NPO status , Patient's Chart, lab work & pertinent test results, reviewed documented beta blocker date and time   Airway Mallampati: II TM Distance: >3 FB Neck ROM: Full    Dental  (+) Teeth Intact and Dental Advisory Given   Pulmonary pneumonia -, resolved,    Pulmonary exam normal       Cardiovascular hypertension, Pt. on home beta blockers and Pt. on medications + CAD + dysrhythmias Atrial Fibrillation  H/o a fib   Neuro/Psych CVA, Residual Symptoms    GI/Hepatic Neg liver ROS, GERD-  Medicated,  Endo/Other  Insulin Dependent  Renal/GU ESRF and DialysisRenal disease     Musculoskeletal   Abdominal   Peds  Hematology   Anesthesia Other Findings   Reproductive/Obstetrics                         Anesthesia Physical Anesthesia Plan  ASA: III  Anesthesia Plan: General   Post-op Pain Management:    Induction: Intravenous  Airway Management Planned: Simple Face Mask and LMA  Additional Equipment:   Intra-op Plan:   Post-operative Plan: Extubation in OR  Informed Consent: I have reviewed the patients History and Physical, chart, labs and discussed the procedure including the risks, benefits and alternatives for the proposed anesthesia with the patient or authorized representative who has indicated his/her understanding and acceptance.   Dental advisory given  Plan Discussed with: CRNA, Anesthesiologist and Surgeon  Anesthesia Plan Comments:        Anesthesia Quick Evaluation

## 2011-10-03 NOTE — Preoperative (Signed)
Beta Blockers   Reason not to administer Beta Blockers:Not Applicable 

## 2011-10-03 NOTE — Op Note (Signed)
OPERATIVE NOTE  PROCEDURE: 1.  Left femoral vein tunneled dialysis catheter placement 2.  Left femoral vein cannulation under ultrasound guidance 3.  Removal of right chest tunneled dialysis catheter   PRE-OPERATIVE DIAGNOSIS: end-stage renal failure  POST-OPERATIVE DIAGNOSIS: same as above  SURGEON: Leonides Sake, MD  ANESTHESIA: General  ESTIMATED BLOOD LOSS: minimal  FINDING(S): 1.  Tips of the catheter in the right atrium on fluoroscopy  SPECIMEN(S):  Catheter tip  INDICATIONS:   Ebony [name] is a 78 y.o. female who  presents with presumed injected right internal jugular vein tunneled dialysis catheter.  The patient presents for left femoral tunneled dialysis catheter placement and right internal jugular vein tunneled dialysis catheter removal.  The patient is aware the risks of tunneled dialysis catheter placement include but are not limited to: bleeding, infection, central venous injury, pneumothorax, possible venous stenosis, possible malpositioning in the venous system, and possible infections related to long-term catheter presence.  The patient was aware of these risks and agreed to proceed.  DESCRIPTION: After written full informed consent was obtained from the patient, the patient was taken back to the operating room.  Prior to induction, the patient was given IV antibiotics.  After obtaining adequate sedation, the patient was prepped and draped in the standard fashion for a femoral vein tunneled dialysis catheter placement.  I anesthesized the groin cannulation site with local anesthetic, then under ultrasound guidance, the left femoral vein vein was cannulated with the 18 gauge needle.  A J-wire was then placed into the IVC under fluroscopic guidance.  The wire was then secured in place with a clamp to the drapes.  The cannulation site, the catheter exit site, and tract for the subcutaneous tunnel were then anesthesized with a total of 20 cc of a 1:1 mixture of 0.5% Marcaine  without epinepherine and 1% Lidocaine with epinepherine.  I then made stab incisions are the cannulation and exit sites.  I dissected from the exit site to the cannulation site and dilated the subcutaneous tunnel with a plastic dilator.  The wire was then unclamped and I removed the needle.  The skin tract and venotomy was dilated serially with dilators.  Finally, the dilator-sheath was placed under fluroscopic guidance into the left iliac vein.  The dilator and wire were removed.  A 55 cm Diatek catheter was placed under fluoroscopic guidance down into the right atrium.  The sheath was broken and peeled away while holding the catheter cuff at the level of the skin.  The back end of this catheter was transected, revealing the two lumens of this catheter.  The ports were docked onto these two lumens.  The catheter hub was then screwed into place.  Each port was tested by aspirating and flushing.  No resistance was noted.  Each port was then thoroughly flushed with heparinized saline.  The catheter was secured in placed with two interrupted stitches of 3-0 Nylon tied to the catheter.  The neck incision was closed with a U-stitch of 4-0 Monocryl.  The neck and chest incision were cleaned and sterile bandages applied.  Each port was then loaded with concentrated heparin (1000 Units/mL) at the manufacturer recommended volumes to each port.  Sterile caps were applied to each port.  On completion fluoroscopy, the tips of the catheter were in the right atrium/inferior vena cava , and there was no evidence of pneumothorax.  At this point, the right chest was prepped and draped.  I injected 5 cc of the local anesthetic mixture  to obtain anesthesia around the tunneled dialysis catheter exit site.  I bluntly dissected around the tunneled dialysis catheter cuff to release the cuff from the subcutaneous tissue.  I held pressure over the internal jugular vein entry site and removed the tunneled dialysis catheter.  The tip was  sent for culture and sensitivity.  A sterile bandage was applied to the exit site.  COMPLICATIONS: none  CONDITION: stable   Ebony Simmer, MD 10/03/2011 8:41 AM

## 2011-10-03 NOTE — H&P (Addendum)
[facility] [place]  Brief History and Physical  History of Present Illness  Ebony [name] is a 78 y.o. female who presents with chief complaint: presumed infected RIJ TDC.  The patient presents today for placement of L femoral TDC and removal of RIJ TDC.Marland Kitchen    Past Medical History  Diagnosis Date  . Atrial fibrillation   . CAD (coronary artery disease)   . Hypertension   . Anemia   . MRSA bacteremia     hx  . Cellulitis   . GERD (gastroesophageal reflux disease)   . Acetabulum fracture   . Contracture of elbow     L arm  . Dementia   . Diabetes mellitus     on Insulin  . Decubitus ulcer, buttock 02/07/2011    Pt presents with dressing to R buttocks/coccyx.  . Pneumonia   . Blood transfusion   . Stroke ~ 1990's    Left side paralysis.  Left  foot drop., L arm contracted.  . Chronic kidney disease 03/13/11    dialysis at [facility]; TTS,last tx 03/12/11 hemo x 20years    Past Surgical History  Procedure Date  . Arteriovenous graft placement   . Appendectomy   . Cholecystectomy   . Tubal ligation     bilateral  . Abdominal hysterectomy   . Av fistula placement 02/07/2011    Procedure: INSERTION OF ARTERIOVENOUS (AV) GORE-TEX GRAFT THIGH;  Surgeon: Pryor Ochoa, MD;  Location: [facility] OR;  Service: Vascular;  Laterality: Right;  insertion AVGG right thigh  . Av fistula placement 02/12/2011    Procedure: INSERTION OF ARTERIOVENOUS (AV) GORE-TEX GRAFT THIGH;  Surgeon: Nilda Simmer, MD;  Location: [facility] OR;  Service: Vascular;  Laterality: Right;  . Cataract extraction w/ intraocular lens  implant, bilateral   . Avgg removal 05/24/2011    Procedure: REMOVAL OF ARTERIOVENOUS GORETEX GRAFT (AVGG);  Surgeon: Fransisco Hertz, MD;  Location: [facility] OR;  Service: Vascular;  Laterality: Right;  with bovine patch angioplasty  . Application of wound vac 05/24/2011    Procedure: APPLICATION OF WOUND VAC;  Surgeon: Fransisco Hertz, MD;  Location: [facility] OR;  Service:  Vascular;  Laterality: Right;    History   Social History  . Marital Status: Divorced    Spouse Name: N/A    Number of Children: N/A  . Years of Education: N/A   Occupational History  . Not on file.   Social History Main Topics  . Smoking status: Former Smoker -- 0.2 packs/day for 30 years    Types: Cigarettes    Quit date: 03/12/2000  . Smokeless tobacco: Never Used  . Alcohol Use: No     "last alcohol ~ 1990"  . Drug Use: No  . Sexually Active: No   Other Topics Concern  . Not on file   Social History Narrative  . No narrative on file    Family History  Problem Relation Age of Onset  . Diabetes Mother   . Hypertension Father   . Anesthesia problems Neg Hx   . Hypotension Neg Hx   . Malignant hyperthermia Neg Hx   . Pseudochol deficiency Neg Hx     No current facility-administered medications on file prior to encounter.   Current Outpatient Prescriptions on File Prior to Encounter  Medication Sig Dispense Refill  . amLODipine (NORVASC) 5 MG tablet Take 5 mg by mouth at bedtime.      . calcium acetate (PHOSLO) 667 MG capsule  Take 667 mg by mouth 3 (three) times daily with meals.       Marland Kitchen dextromethorphan-guaiFENesin (ROBITUSSIN-DM) 10-100 MG/5ML liquid Take 10 mLs by mouth every 6 (six) hours as needed. For cough      . docusate sodium (COLACE) 100 MG capsule Take 100 mg by mouth 2 (two) times daily.       . DULoxetine (CYMBALTA) 20 MG capsule Take 20 mg by mouth daily.      . insulin aspart (NOVOLOG) 100 UNIT/ML injection Inject 5 Units into the skin 2 (two) times daily before a meal.      . metoprolol tartrate (LOPRESSOR) 25 MG tablet Take 25 mg by mouth See admin instructions. Take 25mg  once daily on Tuesdays, Thursdays, and Saturdays.  Take 25mg  twice daily on Mondays, Wednesdays, Fridays, and Sundays.      Marland Kitchen omeprazole (PRILOSEC) 20 MG capsule Take 20 mg by mouth daily.       . simvastatin (ZOCOR) 20 MG tablet Take 20 mg by mouth at bedtime.          Allergies  Allergen Reactions  . Aspirin     Unknown    . Heparin     SHE is HIT neg; platelets have been normal on low dose heparin at out pt dialysis ; not clear why this is listed   . Minoxidil     Unknown    . Penicillins     Unknown      Review of Systems: As listed above, otherwise negative.  Physical Examination  Filed Vitals:   10/03/11 0622  BP: 133/80  Pulse: 59  Temp: 98.2 F (36.8 C)  TempSrc: Oral  Resp: 18  SpO2: 99%    General: A&O x 3, WDWN  Pulmonary: Sym exp, good air movt, CTAB, no rales, rhonchi, & wheezing, RIJ TDC without any drainage or surrounding erythema  Cardiac: RRR, Nl S1, S2, no Murmurs, rubs or gallops  Gastrointestinal: soft, NTND, -G/R, - HSM, - masses, - CVAT B  Musculoskeletal: M/S 5/5 throughout , Extremities without ischemic changes   Laboratory See iStat  Medical Decision Making  Ebony [name] is a 78 y.o. female who presents with: presumed infected RIJ TDC.   The patient is scheduled for: placement of L femoral TDC and removal of RIJ TDC. The patient is aware the risks of tunneled dialysis catheter placement include but are not limited to: bleeding, infection, central venous injury, pneumothorax, possible venous stenosis, possible malpositioning in the venous system, and possible infections related to long-term catheter presence.  Note, patient does NOT have HIT.  She routinely is dialyzed with heparin during the run. Also, the patient has refused any further permanent access procedures.  She is aware that placement of the L femoral TDC may compromise her chances for successful L thigh AVG.  The patient is aware of the risks and agrees to proceed.  Leonides Sake, MD [facility] [place] Office: [phone] Pager: [phone]  10/03/2011, 7:22 AM

## 2011-10-03 NOTE — Progress Notes (Addendum)
Right Chest Dressing found saturated with bloody drainage.  When removed, site was bleeding continuously out of exit site of previous catheter.  Manual pressure applied.  Entered by Norva Riffle RN.

## 2011-10-03 NOTE — Progress Notes (Signed)
Dr. Imogene Burn at bedside.  Placed 2 stitches to site to stop bleeding.  No current bleeding from site.  Moderate hematoma at site.  Pressure dressing applied.

## 2011-10-03 NOTE — OR Nursing (Signed)
Patient denies heparin allergy. Discussed heparin allergy with Dr. Imogene Burn. He states to proceed with Heparin flush and heparin injection.

## 2011-10-03 NOTE — Anesthesia Postprocedure Evaluation (Signed)
Anesthesia Post Note  Patient: Ebony [name]  Procedure(s) Performed: Procedure(s) (LRB): INSERTION OF DIALYSIS CATHETER (Left) REMOVAL OF A DIALYSIS CATHETER (Right)  Anesthesia type: general  Patient location: PACU  Post pain: Pain level controlled  Post assessment: Patient's Cardiovascular Status Stable  Last Vitals:  Filed Vitals:   10/03/11 1000  BP: 159/79  Pulse: 79  Temp:   Resp: 28    Post vital signs: Reviewed and stable  Level of consciousness: sedated  Complications: No apparent anesthesia complications

## 2011-10-03 NOTE — Progress Notes (Signed)
Manual pressure continues to site.  Continuous ooze of bloody drainage.  Attempted to reach Dr. Imogene Burn.  He is in the OR.  Instructed to continue manual pressure to "x".

## 2011-10-04 ENCOUNTER — Encounter (HOSPITAL_COMMUNITY): Payer: Self-pay | Admitting: Vascular Surgery

## 2011-10-04 MED FILL — Mupirocin Oint 2%: CUTANEOUS | Qty: 22 | Status: AC

## 2011-10-06 LAB — CATH TIP CULTURE

## 2011-11-13 IMAGING — XA IR DECLOT *R*
1 series · 12 of 15 positions shown · non-contrast
Comparison: none

Clinical Data/Indication: Thrombosed AVG. Right forearm

DIALYSIS GRAFT THROMBECTOMY, ULTRASOUND VENOUS ACCESS, PTA VENOUS
Sedation: Versed one mg, Fentanyl 50 mcg.
Total Moderate Sedation Time: V6 minutes.
Contrast Volume: 50 ml [id number].
Additional Medications: None.  The patient is allergic heparin.
Fluoroscopy Time: 3.8 minutes.
Procedure: The procedure, risks, benefits, and alternatives were
explained to the patient. Questions regarding the procedure were
encouraged and answered. The patient understands and consents to
the procedure.
The right forearm was prepped with betadine in a sterile fashion,
and a sterile drape was applied covering the operative field. A
sterile gown and sterile gloves were used for the procedure.
The graft was noted to be occluded  initially with ultrasound.
Under sonographic guidance, a micropuncture needle was inserted
into the graft (Ultrasound image documentation was performed)
directed towards the venous anastomosis.  It was removed over a 018
wire.  A 5-French transitional dilator was inserted.  1 mg t-PA was
injected.  The dilator was exchanged over a Benson wire for a 6-
French sheath.  The identical procedure was performed towards the
arterial anastomosis.
The Kumpe catheter was utilized to advance the Kumpe catheter
across both the arterial and venous anastomoses.  Central
venography was performed.
A six mm angioplasty device was utilized to dilate the venous limb
of the graft, the venous anastomosis.  The Fogarty balloon was then
utilized to sweep the arterial and venous limbs of the graft.
Final imaging was performed.  Sheaths were removed and hemostasis
was achieved with two figure-of-eight O Prolene stitches.

[Series 1: run · 12 of 15 slices shown]
[im 1/15]
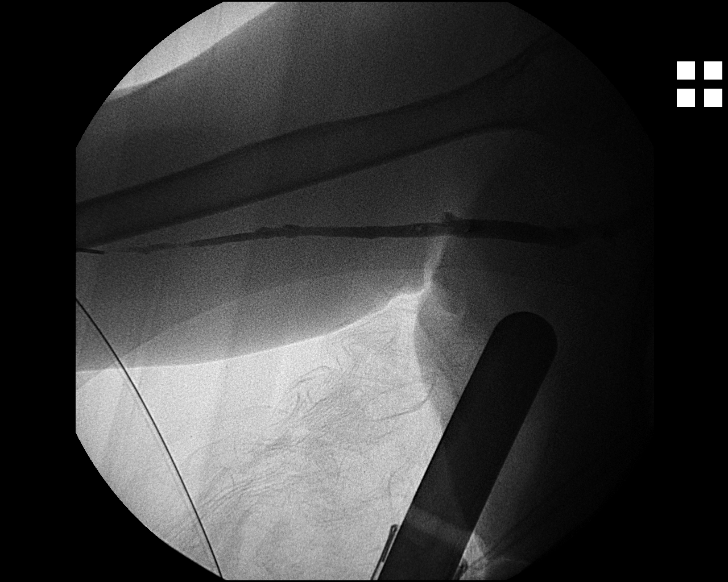
[im 2/15]
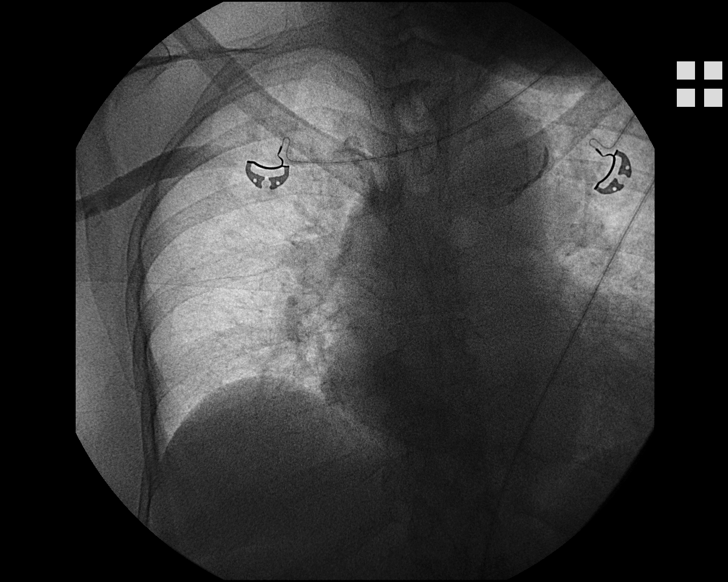
[im 4/15]
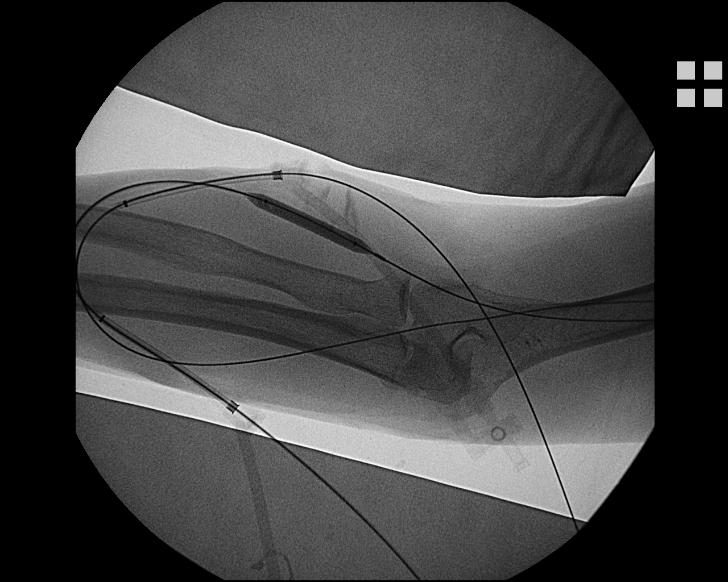
[im 5/15]
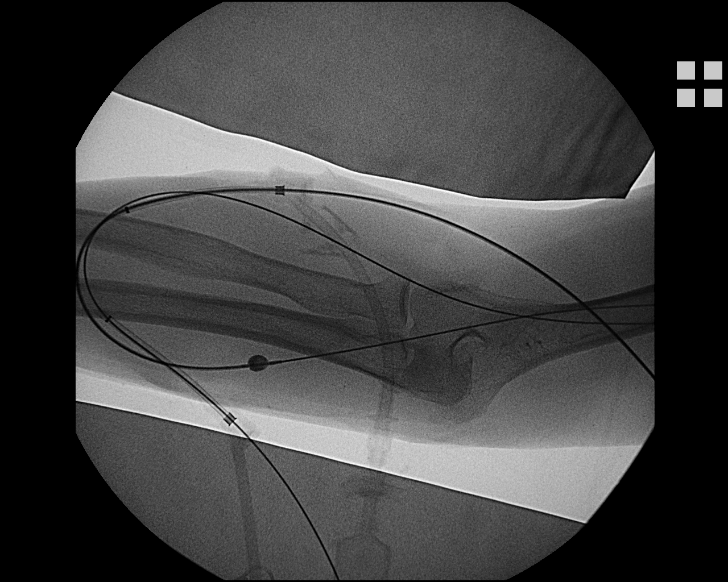
[im 6/15]
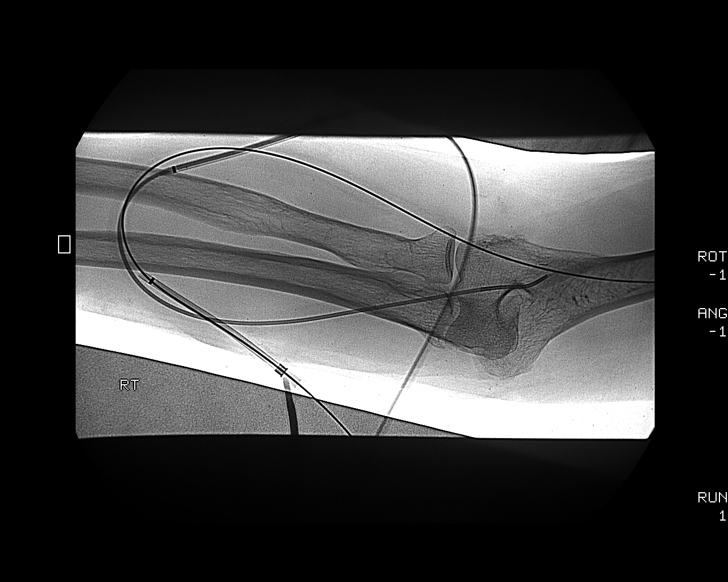
[im 7/15]
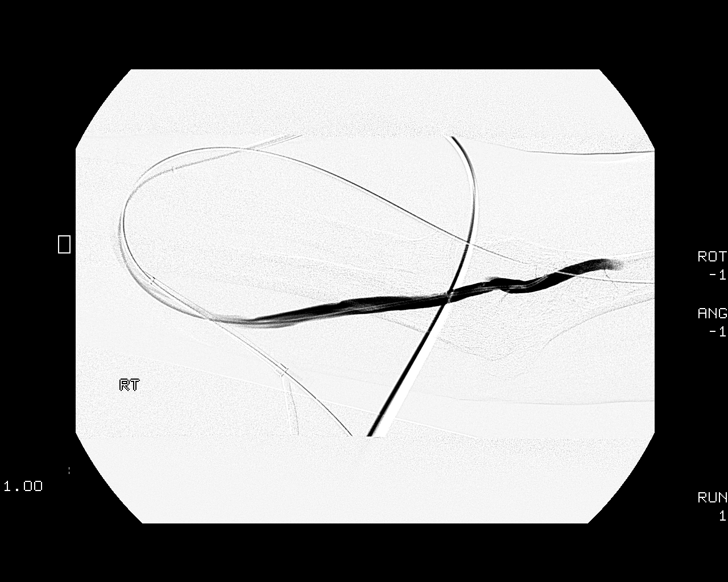
[im 9/15]
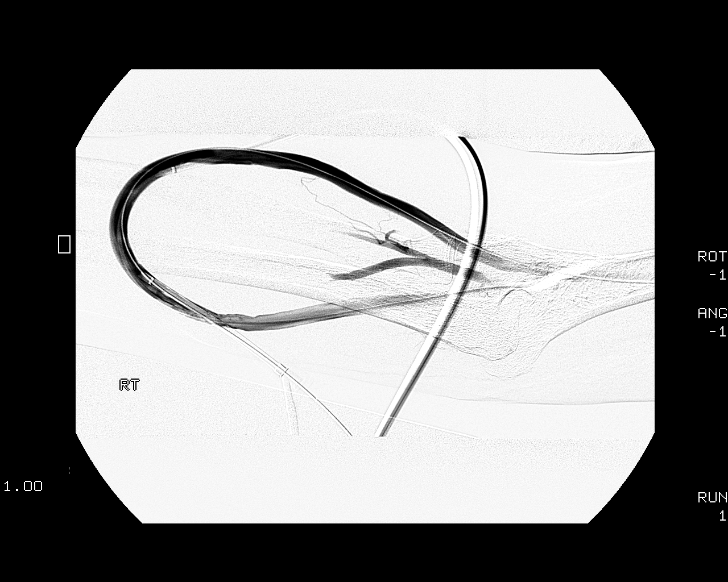
[im 10/15]
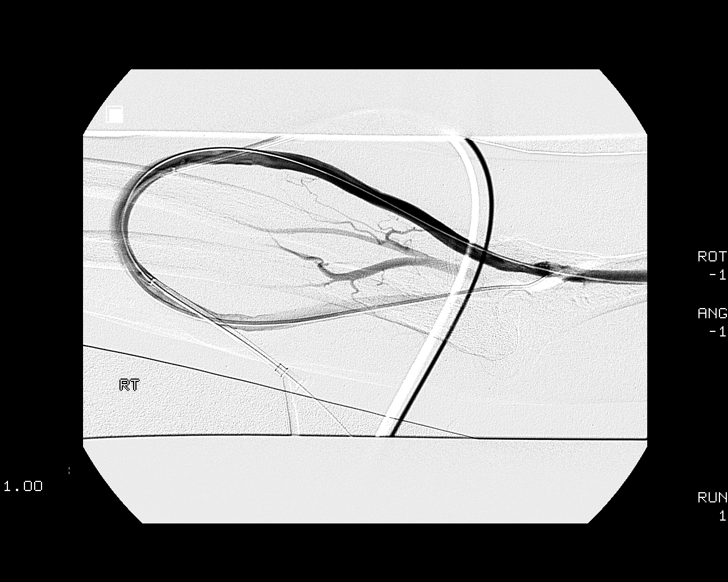
[im 11/15]
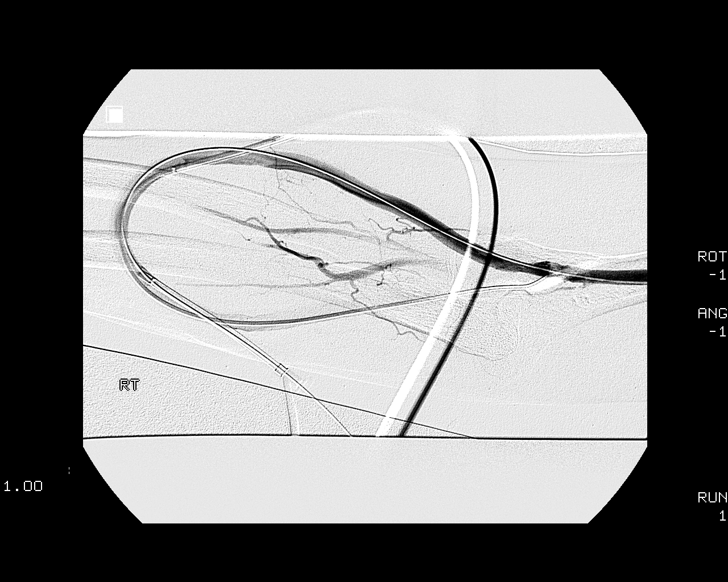
[im 12/15]
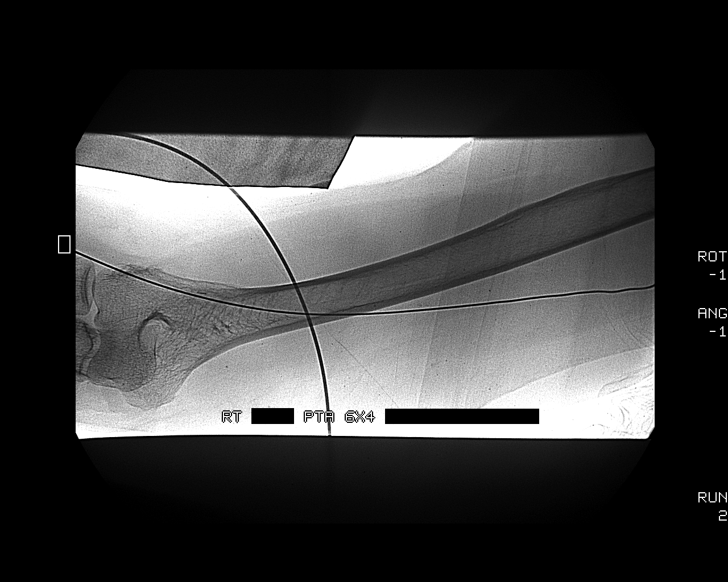
[im 14/15]
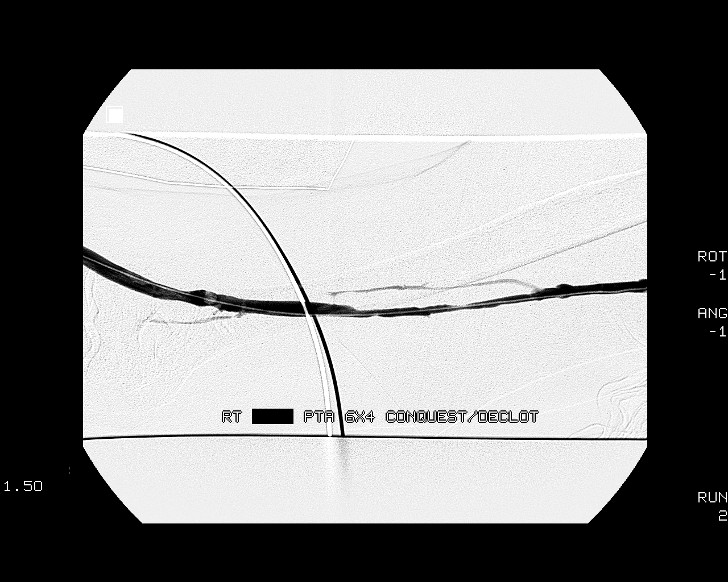
[im 15/15]
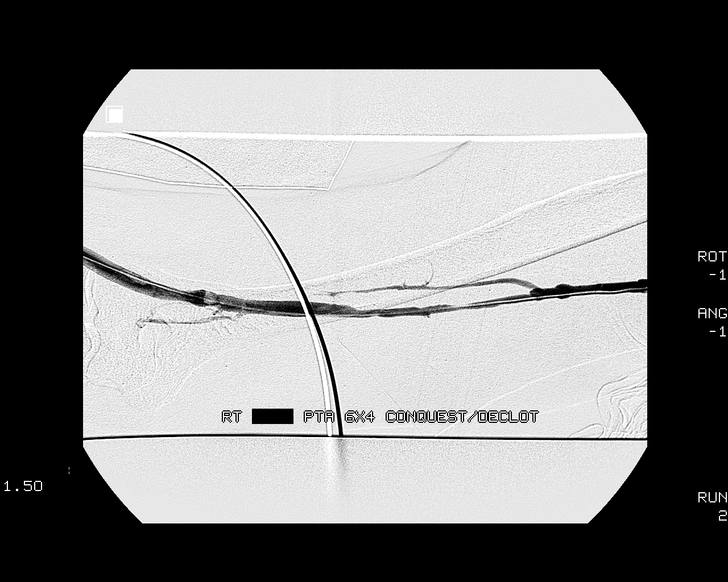

[12 of 15 positions shown; findings below may reference images not displayed]

FINDINGS: Central venography demonstrates wide patency.

Post thrombectomy imaging demonstrates wide patency of the AV
graft.

Complications: None
IMPRESSION: Successful right forearm AV graft thrombectomy and dilatation of
the venous anastomosis to 6 mm.

## 2011-11-14 DIAGNOSIS — N2581 Secondary hyperparathyroidism of renal origin: Secondary | ICD-10-CM

## 2011-11-14 HISTORY — DX: Secondary hyperparathyroidism of renal origin: N25.81

## 2011-11-22 ENCOUNTER — Encounter: Payer: Self-pay | Admitting: *Deleted

## 2011-11-22 ENCOUNTER — Other Ambulatory Visit: Payer: Self-pay | Admitting: *Deleted

## 2011-11-23 ENCOUNTER — Encounter (HOSPITAL_COMMUNITY): Payer: Self-pay | Admitting: Pharmacy Technician

## 2011-11-30 ENCOUNTER — Encounter (HOSPITAL_COMMUNITY): Payer: Self-pay | Admitting: *Deleted

## 2011-11-30 ENCOUNTER — Other Ambulatory Visit: Payer: Self-pay | Admitting: *Deleted

## 2011-11-30 MED ORDER — SODIUM CHLORIDE 0.9 % IV SOLN: INTRAVENOUS | Status: DC

## 2011-11-30 NOTE — Progress Notes (Signed)
I spoke with Heneritta, pts nurse at [facility] and discussed pts history.  I instructed her of time patient to arrive, NPO after midnight and what meds to take the am of OR.

## 2011-12-02 MED ORDER — VANCOMYCIN HCL 1000 MG IV SOLR
1500.0000 mg | INTRAVENOUS | Status: DC
  Filled 2011-12-02: qty 1500

## 2011-12-03 ENCOUNTER — Encounter (HOSPITAL_COMMUNITY): Payer: Self-pay | Admitting: Critical Care Medicine

## 2011-12-03 ENCOUNTER — Encounter (HOSPITAL_COMMUNITY)
Admission: RE | Disposition: A | Discharge status: Skilled Nursing Facility | Payer: Self-pay | Source: Ambulatory Visit | Attending: Vascular Surgery

## 2011-12-03 ENCOUNTER — Ambulatory Visit (HOSPITAL_COMMUNITY)
Admission: RE | Admit: 2011-12-03 | Discharge: 2011-12-03 | Disposition: A | Discharge status: Skilled Nursing Facility | Payer: Medicare Other | Source: Ambulatory Visit | Attending: Vascular Surgery | Admitting: Vascular Surgery

## 2011-12-03 ENCOUNTER — Encounter (HOSPITAL_COMMUNITY): Payer: Self-pay | Admitting: Surgery

## 2011-12-03 DIAGNOSIS — N186 End stage renal disease: Secondary | ICD-10-CM | POA: Insufficient documentation

## 2011-12-03 DIAGNOSIS — Z538 Procedure and treatment not carried out for other reasons: Secondary | ICD-10-CM | POA: Insufficient documentation

## 2011-12-03 HISTORY — DX: Major depressive disorder, single episode, unspecified: F32.9

## 2011-12-03 LAB — POCT I-STAT, CHEM 8
BUN: 38 mg/dL — ABNORMAL HIGH (ref 6–23)
Calcium, Ion: 1.05 mmol/L — ABNORMAL LOW (ref 1.13–1.30)
HCT: 43 % (ref 36.0–46.0)
Hemoglobin: 14.6 g/dL (ref 12.0–15.0)
Sodium: 141 mEq/L (ref 135–145)
TCO2: 27 mmol/L (ref 0–100)

## 2011-12-03 LAB — PROTIME-INR
INR: 0.99 (ref 0.00–1.49)
Prothrombin Time: 13 seconds (ref 11.6–15.2)

## 2011-12-03 LAB — APTT: aPTT: 36 seconds (ref 24–37)

## 2011-12-03 SURGERY — INSERTION OF ARTERIOVENOUS (AV) GORE-TEX GRAFT ARM
Anesthesia: General | Site: Leg Upper | Laterality: Left

## 2011-12-03 SURGERY — INSERTION OF ARTERIOVENOUS (AV) GORE-TEX GRAFT THIGH
Anesthesia: General | Site: Leg Upper | Laterality: Left

## 2011-12-03 MED ORDER — METOPROLOL TARTRATE 25 MG PO TABS: 25.0000 mg | ORAL_TABLET | Freq: Once | ORAL | Status: DC

## 2011-12-03 MED ORDER — METOPROLOL TARTRATE 12.5 MG HALF TABLET
ORAL_TABLET | ORAL | Status: AC
  Filled 2011-12-03: qty 2

## 2011-12-03 MED ORDER — SODIUM CHLORIDE 0.9 % IV SOLN
INTRAVENOUS | Status: DC
  Administered 2011-12-03: 10:00:00 via INTRAVENOUS

## 2011-12-03 MED ORDER — MUPIROCIN 2 % EX OINT
TOPICAL_OINTMENT | Freq: Two times a day (BID) | CUTANEOUS | Status: DC
  Administered 2011-12-03: 1 via NASAL

## 2011-12-03 MED ORDER — MUPIROCIN 2 % EX OINT
TOPICAL_OINTMENT | CUTANEOUS | Status: AC
  Administered 2011-12-03: 1 via NASAL
  Filled 2011-12-03: qty 22

## 2011-12-03 SURGICAL SUPPLY — 31 items
CANISTER SUCTION 2500CC (MISCELLANEOUS) ×2 IMPLANT
CLIP TI MEDIUM 6 (CLIP) ×2 IMPLANT
CLIP TI WIDE RED SMALL 6 (CLIP) ×2 IMPLANT
CLOTH BEACON ORANGE TIMEOUT ST (SAFETY) ×2 IMPLANT
COVER SURGICAL LIGHT HANDLE (MISCELLANEOUS) ×2 IMPLANT
DECANTER SPIKE VIAL GLASS SM (MISCELLANEOUS) ×2 IMPLANT
DERMABOND ADVANCED (GAUZE/BANDAGES/DRESSINGS) ×1
DERMABOND ADVANCED .7 DNX12 (GAUZE/BANDAGES/DRESSINGS) ×1 IMPLANT
ELECT REM PT RETURN 9FT ADLT (ELECTROSURGICAL) ×2
ELECTRODE REM PT RTRN 9FT ADLT (ELECTROSURGICAL) ×1 IMPLANT
GLOVE BIO SURGEON STRL SZ7 (GLOVE) ×2 IMPLANT
GLOVE BIOGEL PI IND STRL 7.5 (GLOVE) ×1 IMPLANT
GLOVE BIOGEL PI INDICATOR 7.5 (GLOVE) ×1
GOWN STRL NON-REIN LRG LVL3 (GOWN DISPOSABLE) ×4 IMPLANT
KIT BASIN OR (CUSTOM PROCEDURE TRAY) ×2 IMPLANT
KIT ROOM TURNOVER OR (KITS) ×2 IMPLANT
NS IRRIG 1000ML POUR BTL (IV SOLUTION) ×2 IMPLANT
PACK CV ACCESS (CUSTOM PROCEDURE TRAY) ×2 IMPLANT
PAD ARMBOARD 7.5X6 YLW CONV (MISCELLANEOUS) ×4 IMPLANT
SPONGE SURGIFOAM ABS GEL 100 (HEMOSTASIS) IMPLANT
SUT MNCRL AB 4-0 PS2 18 (SUTURE) ×2 IMPLANT
SUT PROLENE 5 0 C 1 24 (SUTURE) IMPLANT
SUT PROLENE 6 0 BV (SUTURE) ×4 IMPLANT
SUT PROLENE 7 0 BV 1 (SUTURE) IMPLANT
SUT SILK 2 0 FS (SUTURE) ×2 IMPLANT
SUT VIC AB 3-0 SH 27 (SUTURE) ×2
SUT VIC AB 3-0 SH 27X BRD (SUTURE) ×2 IMPLANT
TOWEL OR 17X24 6PK STRL BLUE (TOWEL DISPOSABLE) ×2 IMPLANT
TOWEL OR 17X26 10 PK STRL BLUE (TOWEL DISPOSABLE) ×2 IMPLANT
UNDERPAD 30X30 INCONTINENT (UNDERPADS AND DIAPERS) ×2 IMPLANT
WATER STERILE IRR 1000ML POUR (IV SOLUTION) ×2 IMPLANT

## 2011-12-03 NOTE — Progress Notes (Signed)
Accessed Diateck in Left thigh per Dr. Noreene Larsson. Case was cancelled by Dr. Imogene Burn. IV team paged to [facility].

## 2011-12-03 NOTE — Progress Notes (Signed)
Patient dressed and is sitting in wheelchair. Call bell in reach. Patient is awaiting transportation.

## 2011-12-03 NOTE — Progress Notes (Signed)
Radial pulse obtained for one full minute and it was found to be 56. Lopressor held per protocol.

## 2011-12-03 NOTE — Progress Notes (Signed)
Patient returned from OR holding via stretcher. Surgery cancelled. Nurse notified Chip Boer at [facility] of this. Transportation called and they stated they would send someone to pick patient up shortly.

## 2011-12-03 NOTE — H&P (Addendum)
[facility] OF [place]  Brief History and Physical  History of Present Illness  Ebony [name] is a 78 y.o. female who presents with chief complaint: end stage renal disease.  The patient presents today for L thigh arteriovenous graft placement.    Past Medical History  Diagnosis Date  . Atrial fibrillation   . CAD (coronary artery disease)   . Hypertension   . Anemia   . MRSA bacteremia     hx  . Cellulitis   . GERD (gastroesophageal reflux disease)   . Acetabulum fracture   . Contracture of elbow     L arm  . Dementia   . Diabetes mellitus     on Insulin  . Decubitus ulcer, buttock 02/07/2011    Pt presents with dressing to R buttocks/coccyx.  . Pneumonia   . Blood transfusion   . Stroke ~ 1990's    Left side paralysis.  Left  foot drop., L arm contracted.  . Chronic kidney disease 03/13/11    dialysis at [facility]; TTS,last tx 03/12/11 hemo x 20years    Past Surgical History  Procedure Date  . Arteriovenous graft placement   . Appendectomy   . Cholecystectomy   . Tubal ligation     bilateral  . Abdominal hysterectomy   . Av fistula placement 02/07/2011    Procedure: INSERTION OF ARTERIOVENOUS (AV) GORE-TEX GRAFT THIGH;  Surgeon: Pryor Ochoa, MD;  Location: [facility] OR;  Service: Vascular;  Laterality: Right;  insertion AVGG right thigh  . Av fistula placement 02/12/2011    Procedure: INSERTION OF ARTERIOVENOUS (AV) GORE-TEX GRAFT THIGH;  Surgeon: Nilda Simmer, MD;  Location: [facility] OR;  Service: Vascular;  Laterality: Right;  . Cataract extraction w/ intraocular lens  implant, bilateral   . Avgg removal 05/24/2011    Procedure: REMOVAL OF ARTERIOVENOUS GORETEX GRAFT (AVGG);  Surgeon: Fransisco Hertz, MD;  Location: [facility] OR;  Service: Vascular;  Laterality: Right;  with bovine patch angioplasty  . Application of wound vac 05/24/2011    Procedure: APPLICATION OF WOUND VAC;  Surgeon: Fransisco Hertz, MD;  Location: [facility] OR;  Service: Vascular;   Laterality: Right;  . Insertion of dialysis catheter 10/03/2011    Procedure: INSERTION OF DIALYSIS CATHETER;  Surgeon: Fransisco Hertz, MD;  Location: [facility] OR;  Service: Vascular;  Laterality: Left;  Insertion left femoral tunneled dialysis catheter    History   Social History  . Marital Status: Divorced    Spouse Name: N/A    Number of Children: N/A  . Years of Education: N/A   Occupational History  . Not on file.   Social History Main Topics  . Smoking status: Former Smoker -- 0.2 packs/day for 30 years    Types: Cigarettes    Quit date: 03/12/2000  . Smokeless tobacco: Never Used  . Alcohol Use: No     "last alcohol ~ 1990"  . Drug Use: No  . Sexually Active: No   Other Topics Concern  . Not on file   Social History Narrative  . No narrative on file    Family History  Problem Relation Age of Onset  . Diabetes Mother   . Hypertension Father   . Anesthesia problems Neg Hx   . Hypotension Neg Hx   . Malignant hyperthermia Neg Hx   . Pseudochol deficiency Neg Hx     No current facility-administered medications on file prior to encounter.   Current Outpatient Prescriptions on File Prior  to Encounter  Medication Sig Dispense Refill  . acetaminophen (TYLENOL) 325 MG tablet Take 325-650 mg by mouth every 4 (four) hours as needed. For headache.      Marland Kitchen amLODipine (NORVASC) 5 MG tablet Take 5 mg by mouth at bedtime.      Marland Kitchen b complex-vitamin c-folic acid (NEPHRO-VITE) 0.8 MG TABS Take 0.8 mg by mouth every morning.      . bisacodyl (BISACODYL) 5 MG EC tablet Take 10 mg by mouth daily as needed. For constipation.      . calcium acetate (PHOSLO) 667 MG capsule Take 667 mg by mouth 3 (three) times daily with meals.       . calcium carbonate, dosed in mg elemental calcium, 1250 MG/5ML Take 500 mg of elemental calcium by mouth every 6 (six) hours as needed. For acid reflux.      Marland Kitchen dextromethorphan-guaiFENesin (ROBITUSSIN-DM) 10-100 MG/5ML liquid Take 10 mLs by mouth every 6 (six)  hours as needed. For cough      . docusate sodium (COLACE) 100 MG capsule Take 100 mg by mouth 2 (two) times daily.       . DULoxetine (CYMBALTA) 20 MG capsule Take 20 mg by mouth daily.      . feeding supplement (PRO-STAT SUGAR FREE 64) LIQD Take 30 mLs by mouth 3 (three) times daily with meals.      . insulin aspart (NOVOLOG) 100 UNIT/ML injection Inject 5 Units into the skin 2 (two) times daily before a meal.      . insulin glargine (LANTUS) 100 UNIT/ML injection Inject 5 Units into the skin at bedtime.      . lidocaine-prilocaine (EMLA) cream Apply 1 application topically Every Tuesday,Thursday,and Saturday with dialysis.      Marland Kitchen metoprolol tartrate (LOPRESSOR) 25 MG tablet Take 25 mg by mouth daily.       Marland Kitchen omeprazole (PRILOSEC) 20 MG capsule Take 20 mg by mouth daily.       Marland Kitchen oxyCODONE-acetaminophen (PERCOCET/ROXICET) 5-325 MG per tablet Take 1-2 tablets by mouth every 4 (four) hours as needed. For pain.  Give 1 tablet for pain scale 1-5 and give 2 tablets for pain scale 6-10.      Marland Kitchen paricalcitol (ZEMPLAR) 2 MCG/ML injection Inject 1 mcg into the vein 3 (three) times a week. Inject during dialysis.      Marland Kitchen simvastatin (ZOCOR) 20 MG tablet Take 20 mg by mouth at bedtime.       . Skin Protectants, Misc. (EUCERIN) cream Apply 1 application topically daily as needed. For feet and other dry areas on the body.        Allergies  Allergen Reactions  . Aspirin     Unknown    . Heparin     SHE is HIT neg; platelets have been normal on low dose heparin at out pt dialysis ; not clear why this is listed   . Minoxidil     Unknown    . Penicillins     Unknown      Review of Systems: As listed above, otherwise negative.  Physical Examination  Filed Vitals:   12/03/11 0809  BP: 173/93  Pulse: 58  Temp: 98 F (36.7 C)  TempSrc: Oral  Resp: 18  SpO2: 97%    General: A&O x 3, WDWN  Pulmonary: Sym exp, good air movt, CTAB, no rales, rhonchi, & wheezing  Cardiac: RRR, Nl S1, S2, no  Murmurs, rubs or gallops  Gastrointestinal: soft, NTND, -G/R, - HSM, - masses, -  CVAT B  Musculoskeletal: M/S 5/5 throughout , Extremities without ischemic changes , R thigh wounds healed, L femoral TDC in placement  Laboratory See iStat  Medical Decision Making  KESA BIRKY is a 78 y.o. female who presents with: end stage renal disease in need of permanent access.   The patient is scheduled for: L thigh arteriovenous graft   Risk, benefits, and alternatives to access surgery were discussed.  The patient is aware the risks include but are not limited to: bleeding, infection, steal syndrome, nerve damage, ischemic monomelic neuropathy, failure to mature, and need for additional procedures.  The patient is aware also of the risks of possible amputation and wound complications associated a left thigh arteriovenous graft.  The patient is aware of the risks and agrees to proceed.  Leonides Sake, MD [facility] [place] Office: [phone] Pager: [phone]  12/03/2011, 7:49 AM   Addendum  I reviewed this patient's prior studies.  From my prior studies, she does not have an neck or subclavian tunneled dialysis catheter option.  Her right thigh arteriovenous graft failed due to iliac venous stenosis vs. occlusion resulting in venous hypertension and infection of the right thigh arteriovenous graft.  This patient does NOT have an easy alternative location for a left thigh arteriovenous graft.  Getting a tunneled dialysis catheter into the right iliofemoral system would likely involve possible risky right venoplasty, and the equipment requirements are NOT available today.  If you can't get the tunneled dialysis catheter into the right femoral vein, she would need either a translumbar vs transhepatic catheter to facilitate placement of a left thigh arteriovenous graft.  This unfortunately was not done prior to this scheduled procedure.  I do not think it is safe to  assume a translumbar or transhepatic catheter can be placed AFTER the thigh graft placement.    Additionally, this left femoral tunneled dialysis catheter has been in place for months, so she is at high risk for a repeat of her previous complications on the right.  I had discussed this multiple times previously with the patient, and it was my understanding that she did not want a repeat of the right thigh arteriovenous graft complications.  One possible alternative is placement of a Accuseal graft and removal of the left femoral tunneled dialysis catheter at the same time.  This particular graft is not stocked in this hospital at this time, so special arrangements would need to be made.  I think this is probably a more reasonable option.  Leonides Sake, MD [facility] [place] Office: [phone] Pager: [phone]  12/03/2011, 9:54 AM

## 2011-12-03 NOTE — Anesthesia Preprocedure Evaluation (Deleted)
Anesthesia Evaluation  Patient identified by MRN, date of birth, ID band Patient awake and Patient confused    Reviewed: Allergy & Precautions, H&P , NPO status , Patient's Chart, lab work & pertinent test results  Airway       Dental  (+) Teeth Intact   Pulmonary  breath sounds clear to auscultation        Cardiovascular Rhythm:Regular Rate:Normal     Neuro/Psych    GI/Hepatic   Endo/Other    Renal/GU      Musculoskeletal   Abdominal   Peds  Hematology   Anesthesia Other Findings ESRD last HD 9/21K-3.8 S/P CVA with Contracture L. Arm Type 2 DM glucose 87 htn  Plan GA with LMA  Reproductive/Obstetrics                           Anesthesia Physical Anesthesia Plan  ASA: III  Anesthesia Plan: General   Post-op Pain Management:    Induction: Intravenous  Airway Management Planned: LMA  Additional Equipment:   Intra-op Plan:   Post-operative Plan:   Informed Consent: I have reviewed the patients History and Physical, chart, labs and discussed the procedure including the risks, benefits and alternatives for the proposed anesthesia with the patient or authorized representative who has indicated his/her understanding and acceptance.   Dental advisory given  Plan Discussed with: CRNA and Surgeon  Anesthesia Plan Comments: (ESRD last HD 9/21 K-3.8 htn S/P CVA with contracture L. Arm Type 2 DM glucose 87 H/O Afib now in SR Dementia  Plan GA with LMA  Kipp Brood, MD)       Anesthesia Quick Evaluation

## 2012-01-09 ENCOUNTER — Other Ambulatory Visit: Payer: Self-pay

## 2012-01-10 ENCOUNTER — Encounter (HOSPITAL_COMMUNITY): Payer: Self-pay | Admitting: Pharmacy Technician

## 2012-01-15 ENCOUNTER — Encounter (HOSPITAL_COMMUNITY): Payer: Self-pay

## 2012-01-15 MED ORDER — VANCOMYCIN HCL IN DEXTROSE 1-5 GM/200ML-% IV SOLN
1000.0000 mg | INTRAVENOUS | Status: AC
  Administered 2012-01-16: 1000 mg via INTRAVENOUS
  Filled 2012-01-15: qty 200

## 2012-01-16 ENCOUNTER — Ambulatory Visit (HOSPITAL_COMMUNITY): Payer: Medicare Other

## 2012-01-16 ENCOUNTER — Encounter (HOSPITAL_COMMUNITY): Payer: Self-pay | Admitting: *Deleted

## 2012-01-16 ENCOUNTER — Ambulatory Visit (HOSPITAL_COMMUNITY)
Admission: RE | Admit: 2012-01-16 | Discharge: 2012-01-16 | Disposition: A | Discharge status: Skilled Nursing Facility | Payer: Medicare Other | Source: Ambulatory Visit | Attending: Vascular Surgery | Admitting: Vascular Surgery

## 2012-01-16 ENCOUNTER — Ambulatory Visit (HOSPITAL_COMMUNITY): Payer: Medicare Other | Admitting: *Deleted

## 2012-01-16 ENCOUNTER — Encounter (HOSPITAL_COMMUNITY)
Admission: RE | Disposition: A | Discharge status: Skilled Nursing Facility | Payer: Self-pay | Source: Ambulatory Visit | Attending: Vascular Surgery

## 2012-01-16 DIAGNOSIS — D649 Anemia, unspecified: Secondary | ICD-10-CM | POA: Insufficient documentation

## 2012-01-16 DIAGNOSIS — I69998 Other sequelae following unspecified cerebrovascular disease: Secondary | ICD-10-CM | POA: Insufficient documentation

## 2012-01-16 DIAGNOSIS — Z794 Long term (current) use of insulin: Secondary | ICD-10-CM | POA: Insufficient documentation

## 2012-01-16 DIAGNOSIS — I2510 Atherosclerotic heart disease of native coronary artery without angina pectoris: Secondary | ICD-10-CM | POA: Insufficient documentation

## 2012-01-16 DIAGNOSIS — N186 End stage renal disease: Secondary | ICD-10-CM | POA: Insufficient documentation

## 2012-01-16 DIAGNOSIS — I4891 Unspecified atrial fibrillation: Secondary | ICD-10-CM | POA: Insufficient documentation

## 2012-01-16 DIAGNOSIS — E119 Type 2 diabetes mellitus without complications: Secondary | ICD-10-CM | POA: Insufficient documentation

## 2012-01-16 DIAGNOSIS — I120 Hypertensive chronic kidney disease with stage 5 chronic kidney disease or end stage renal disease: Secondary | ICD-10-CM | POA: Insufficient documentation

## 2012-01-16 DIAGNOSIS — Z992 Dependence on renal dialysis: Secondary | ICD-10-CM | POA: Insufficient documentation

## 2012-01-16 DIAGNOSIS — Z4901 Encounter for fitting and adjustment of extracorporeal dialysis catheter: Secondary | ICD-10-CM | POA: Insufficient documentation

## 2012-01-16 DIAGNOSIS — I708 Atherosclerosis of other arteries: Secondary | ICD-10-CM | POA: Insufficient documentation

## 2012-01-16 DIAGNOSIS — F0390 Unspecified dementia without behavioral disturbance: Secondary | ICD-10-CM | POA: Insufficient documentation

## 2012-01-16 HISTORY — DX: Hyperlipidemia, unspecified: E78.5

## 2012-01-16 HISTORY — PX: INSERTION OF DIALYSIS CATHETER: SHX1324

## 2012-01-16 HISTORY — PX: REMOVAL OF A DIALYSIS CATHETER: SHX6053

## 2012-01-16 LAB — POCT I-STAT, CHEM 8
BUN: 19 mg/dL (ref 6–23)
Calcium, Ion: 1.11 mmol/L — ABNORMAL LOW (ref 1.13–1.30)
Creatinine, Ser: 4.1 mg/dL — ABNORMAL HIGH (ref 0.50–1.10)
Sodium: 140 mEq/L (ref 135–145)
TCO2: 30 mmol/L (ref 0–100)

## 2012-01-16 LAB — SURGICAL PCR SCREEN
MRSA, PCR: NEGATIVE
Staphylococcus aureus: NEGATIVE

## 2012-01-16 LAB — GLUCOSE, CAPILLARY: Glucose-Capillary: 135 mg/dL — ABNORMAL HIGH (ref 70–99)

## 2012-01-16 SURGERY — VENOGRAM
Anesthesia: General | Site: Thigh | Laterality: Left | Wound class: Clean

## 2012-01-16 MED ORDER — PROPOFOL 10 MG/ML IV BOLUS
INTRAVENOUS | Status: DC | PRN
  Administered 2012-01-16: 110 mg via INTRAVENOUS

## 2012-01-16 MED ORDER — HEPARIN SODIUM (PORCINE) 1000 UNIT/ML IJ SOLN
INTRAMUSCULAR | Status: DC | PRN
  Administered 2012-01-16: 7 mL

## 2012-01-16 MED ORDER — SODIUM CHLORIDE 0.9 % IV SOLN
INTRAVENOUS | Status: DC | PRN
  Administered 2012-01-16 (×2): via INTRAVENOUS

## 2012-01-16 MED ORDER — OXYCODONE HCL 5 MG PO TABS: 5.0000 mg | ORAL_TABLET | Freq: Once | ORAL | Status: AC | PRN

## 2012-01-16 MED ORDER — ONDANSETRON HCL 4 MG/2ML IJ SOLN
INTRAMUSCULAR | Status: DC | PRN
  Administered 2012-01-16: 4 mg via INTRAVENOUS

## 2012-01-16 MED ORDER — DEXTROSE 5 % IV SOLN
INTRAVENOUS | Status: DC | PRN
  Administered 2012-01-16: 11:00:00 via INTRAVENOUS

## 2012-01-16 MED ORDER — MUPIROCIN 2 % EX OINT
TOPICAL_OINTMENT | Freq: Two times a day (BID) | CUTANEOUS | Status: DC
  Administered 2012-01-16: 09:00:00 via NASAL
  Filled 2012-01-16: qty 22

## 2012-01-16 MED ORDER — SODIUM CHLORIDE 0.9 % IV SOLN: INTRAVENOUS | Status: DC

## 2012-01-16 MED ORDER — SODIUM CHLORIDE 0.9 % IV SOLN
10.0000 mg | INTRAVENOUS | Status: DC | PRN
  Administered 2012-01-16: 100 ug/min via INTRAVENOUS

## 2012-01-16 MED ORDER — PROMETHAZINE HCL 25 MG/ML IJ SOLN: 6.2500 mg | INTRAMUSCULAR | Status: DC | PRN

## 2012-01-16 MED ORDER — LIDOCAINE HCL (CARDIAC) 20 MG/ML IV SOLN
INTRAVENOUS | Status: DC | PRN
  Administered 2012-01-16: 70 mg via INTRAVENOUS

## 2012-01-16 MED ORDER — HYDROMORPHONE HCL PF 1 MG/ML IJ SOLN: 0.2500 mg | INTRAMUSCULAR | Status: DC | PRN

## 2012-01-16 MED ORDER — OXYCODONE HCL 5 MG/5ML PO SOLN: 5.0000 mg | Freq: Once | ORAL | Status: AC | PRN

## 2012-01-16 MED ORDER — IOHEXOL 300 MG/ML  SOLN
INTRAMUSCULAR | Status: DC | PRN
  Administered 2012-01-16: 20 mL via INTRAVENOUS

## 2012-01-16 MED ORDER — MUPIROCIN 2 % EX OINT
TOPICAL_OINTMENT | CUTANEOUS | Status: AC
  Filled 2012-01-16: qty 22

## 2012-01-16 MED ORDER — FENTANYL CITRATE 0.05 MG/ML IJ SOLN
INTRAMUSCULAR | Status: DC | PRN
  Administered 2012-01-16: 100 ug via INTRAVENOUS
  Administered 2012-01-16: 50 ug via INTRAVENOUS

## 2012-01-16 MED ORDER — GLYCOPYRROLATE 0.2 MG/ML IJ SOLN
INTRAMUSCULAR | Status: DC | PRN
  Administered 2012-01-16: 0.2 mg via INTRAVENOUS

## 2012-01-16 MED ORDER — EPHEDRINE SULFATE 50 MG/ML IJ SOLN
INTRAMUSCULAR | Status: DC | PRN
  Administered 2012-01-16 (×2): 15 mg via INTRAVENOUS
  Administered 2012-01-16: 10 mg via INTRAVENOUS

## 2012-01-16 MED ORDER — HEPARIN SODIUM (PORCINE) 1000 UNIT/ML IJ SOLN
INTRAMUSCULAR | Status: AC
  Filled 2012-01-16: qty 1

## 2012-01-16 SURGICAL SUPPLY — 40 items
CANISTER SUCTION 2500CC (MISCELLANEOUS) ×3 IMPLANT
CATH CANNON HEMO 15F 50CM (CATHETERS) ×3 IMPLANT
CLIP TI MEDIUM 6 (CLIP) ×3 IMPLANT
CLIP TI WIDE RED SMALL 6 (CLIP) ×3 IMPLANT
CLOTH BEACON ORANGE TIMEOUT ST (SAFETY) ×3 IMPLANT
COVER SURGICAL LIGHT HANDLE (MISCELLANEOUS) ×3 IMPLANT
DERMABOND ADVANCED (GAUZE/BANDAGES/DRESSINGS) ×1
DERMABOND ADVANCED .7 DNX12 (GAUZE/BANDAGES/DRESSINGS) ×2 IMPLANT
DRAPE C-ARM 42X72 X-RAY (DRAPES) ×3 IMPLANT
DRAPE INCISE IOBAN 66X45 STRL (DRAPES) ×3 IMPLANT
ELECT REM PT RETURN 9FT ADLT (ELECTROSURGICAL) ×3
ELECTRODE REM PT RTRN 9FT ADLT (ELECTROSURGICAL) ×2 IMPLANT
FAST-CATH (406128 REF #) ×3 IMPLANT
GAUZE SPONGE 2X2 8PLY STRL LF (GAUZE/BANDAGES/DRESSINGS) ×2 IMPLANT
GLOVE BIO SURGEON STRL SZ7 (GLOVE) ×3 IMPLANT
GLOVE BIOGEL PI IND STRL 7.5 (GLOVE) ×2 IMPLANT
GLOVE BIOGEL PI INDICATOR 7.5 (GLOVE) ×1
GOWN STRL NON-REIN LRG LVL3 (GOWN DISPOSABLE) ×9 IMPLANT
HEMOSTAT SURGICEL 2X14 (HEMOSTASIS) IMPLANT
KIT BASIN OR (CUSTOM PROCEDURE TRAY) ×3 IMPLANT
KIT ROOM TURNOVER OR (KITS) ×3 IMPLANT
NS IRRIG 1000ML POUR BTL (IV SOLUTION) ×3 IMPLANT
PACK CV ACCESS (CUSTOM PROCEDURE TRAY) ×3 IMPLANT
PAD ARMBOARD 7.5X6 YLW CONV (MISCELLANEOUS) ×6 IMPLANT
SHEATH BRITE TIP 8FR 23CM (MISCELLANEOUS) ×3 IMPLANT
SPONGE GAUZE 2X2 STER 10/PKG (GAUZE/BANDAGES/DRESSINGS) ×1
SPONGE SURGIFOAM ABS GEL 100 (HEMOSTASIS) IMPLANT
SUT ETHILON 3 0 PS 1 (SUTURE) ×3 IMPLANT
SUT GORETEX 5 0 TT13 24 (SUTURE) ×6 IMPLANT
SUT MNCRL AB 4-0 PS2 18 (SUTURE) ×6 IMPLANT
SUT VIC AB 2-0 CT1 27 (SUTURE) ×1
SUT VIC AB 2-0 CT1 TAPERPNT 27 (SUTURE) ×2 IMPLANT
SUT VIC AB 3-0 SH 27 (SUTURE) ×2
SUT VIC AB 3-0 SH 27X BRD (SUTURE) ×4 IMPLANT
SYR 30ML LL (SYRINGE) ×3 IMPLANT
TAPE CLOTH SURG 4X10 WHT LF (GAUZE/BANDAGES/DRESSINGS) ×3 IMPLANT
TOWEL OR 17X24 6PK STRL BLUE (TOWEL DISPOSABLE) ×3 IMPLANT
TOWEL OR 17X26 10 PK STRL BLUE (TOWEL DISPOSABLE) ×3 IMPLANT
WATER STERILE IRR 1000ML POUR (IV SOLUTION) ×3 IMPLANT
WIRE BENTSON .035X145CM (WIRE) ×3 IMPLANT

## 2012-01-16 NOTE — Op Note (Signed)
OPERATIVE NOTE   PROCEDURE: 1. Left femoral tunneled dialysis catheter removal 2. Left pelvic venogram 3. Left femoral tunneled dialysis catheter placement  PRE-OPERATIVE DIAGNOSIS: end stage renal disease, no permanent access  POST-OPERATIVE DIAGNOSIS: same as above   SURGEON: Leonides Sake, MD  ANESTHESIA: general  ESTIMATED BLOOD LOSS: 50 cc  FINDING(S): 1. Left external iliac vein stenosis: residual lumen ~3 mm 2. Left common iliac vein stenosis: <30%  SPECIMEN(S):  none  INDICATIONS:   Ebony [name] is a 78 y.o. female who presents with end stage renal disease.  This patient previously had a right thigh arteriovenous graft complicated by iliac vein stenosis, which resulted in venous hypertension and infection of the graft due to poor wound healing.  The graft subsequently was removed.  She has had a left tunneled dialysis catheter in the left femoral vein for an extended period.  I had previously discussed with the patient the risks of placing a left thigh arteriovenous graft given the long term presence of the tunneled dialysis catheter possibly leading to another iliac vein stenosis.  Her nephrologist convinced her to proceed with the thigh graft.  I discussed with the patient: removal of the femoral tunneled dialysis catheter and placement of a venous sheath to do a pelvic venogram.   Depending on the findings, I was planning on either placing an Accuseal early stick arteriovenous graft or replacing the tunneled dialysis catheter if there was a iliac venous stenosis.  Risk, benefits, and alternatives to access surgery were discussed.  The patient is aware the risks include but are not limited to: bleeding, infection, steal syndrome, nerve damage, ischemic monomelic neuropathy, failure to mature, need for additional procedures, death and stroke.  The patient is aware the risks of tunneled dialysis catheter placement include but are not limited to: bleeding, infection, central venous  injury, pneumothorax, possible venous stenosis, possible malpositioning in the venous system, and possible infections related to long-term catheter presence.  The patient was aware of these risks and agreed to proceed.  DESCRIPTION: After obtain full informed written consent, the patient was brought back to the operating room and placed supine upon the operating table.  The patient received IV antibiotics prior to induction.  After obtaining adequate anesthesia, the patient was prepped and draped in the standard fashion for: left leg thigh graft placement.  I made an incision over the tunneled dialysis catheter near the groin crease and dissected out the catheter.  I clamped it obtain control of the proximal segment and transected the catheter.  I had to make a counterincision to dissect out the cuff of the tunneled dialysis catheter.  I dissected out this cuff and then bluntly removed the distal aspect of the catheter and passed it off the field.  I then clamped one lumen of the catheter and passed a Benson wire into the other lumen.  Over the wire, I removed the dialysis catheter and placed a 10-Fr sheath into the left femoral vein.  I could not get the sheath to aspirate, so I exchanged the sheath for a long 8-Fr sheath which extended into the common iliac vein under fluoroscopy.  I was able to obtain some blood on aspiration at this level, but as I pulled back and aspirated, I could not get blood to aspirate.  I pulled the sheath back to the level of acetabulum and did a hand injections.  Unfortunately, this demonstrates only a external iliac vein lumen of 3 mm, consistent with long segment external iliac vein  stenosis consistent with long-term tunneled dialysis catheter presence.  Subsequently, I felt tunneled dialysis catheter placement was the only option.  I made a stab incision medially on the thigh and dissected from this incision to the sheath incision.  I dilated this tract with the plastic dilator to  accommodate the tunneled dialysis catheter.  I exchanged the sheath for the dilator sheath.  The dilator was removed.  Over the wire, the 55-cm tunneled dialysis catheter was loaded over the wire and loaded into the sheath.  Under fluoroscopic guidance I was able to place the tunneled dialysis catheter into the right atrium.  The wire was removed and the sheath was broken and peeled away.  I loaded the catheter hub onto the catheter and cut the back end of this catheter, revealing the two lumens.  I loaded the two ports onto the lumens and screwed the catheter hub into place.  I was able to aspirate easily from the red port but not the blue port.  I replaced the wire in the blue port and under fluoroscopy rotated and readjusted this tunneled dialysis catheter.  The wire was removed and this time all ports aspirated without difficulty.  I loaded each port with saline.   The catheter was secured with two 3-0 Nylons tied to the catheter.  The groin incision and counterincisoin were closed with a U-stitch of 4-0 Monocryl.  The skin was cleaned and dried.  The exit site was bandaged with a sterile bandage.  The counterincision and groin incisions were reinforced with Dermabond.    Unfortunately, the findings imply that this patient has a hemodynamically significant external iliac vein stenosis on the left, essentially making her end access from a permanent access viewpoint without a tertiary care procedure.  Her iliac stenosis will also complicate any future tunneled dialysis catheter placements.  I would refer her to [facility] for evaluation for a tertiary access procedure, including possible HeRO graft-catheter placement into inferior vena cava.    COMPLICATIONS: none  CONDITION: stable  Leonides Sake, MD [facility] [place] Office: [phone] Pager: [phone]  01/16/2012, 12:20 PM

## 2012-01-16 NOTE — Progress Notes (Signed)
Getting pt ready for discharge. Bloody drainage noted to left groin dsg. Removed dsg.; slow steady oozing from old diatek site; P.A. Notified.

## 2012-01-16 NOTE — Preoperative (Signed)
Beta Blockers   Reason not to administer Beta Blockers:Not Applicable, took this AM 

## 2012-01-16 NOTE — Discharge Summary (Signed)
[facility] Discharge Summary   Patient ID:  Ebony [name] MRN: [id number] DOB/AGE: [birth year] 76 y.o.  Admit date: 01/16/2012 Discharge date: 01/16/2012 Date of Surgery: 01/16/2012 Surgeon: Surgeon(s): Fransisco Hertz, MD  Admission Diagnosis: End Stage Renal Disease  Discharge Diagnoses:  End Stage Renal Disease  Secondary Diagnoses: Past Medical History  Diagnosis Date  . Atrial fibrillation   . CAD (coronary artery disease)   . Hypertension   . Anemia   . MRSA bacteremia     hx  . Cellulitis   . GERD (gastroesophageal reflux disease)   . Acetabulum fracture   . Contracture of elbow     L arm  . Dementia   . Diabetes mellitus     on Insulin  . Decubitus ulcer, buttock 02/07/2011    Pt presents with dressing to R buttocks/coccyx.  . Pneumonia   . Blood transfusion   . Stroke ~ 1990's    Left side paralysis.  Left  foot drop., L arm contracted.  . Chronic kidney disease 03/13/11    dialysis at [facility]; TTS,last tx 03/12/11 hemo x 20years  . Depression   . Hyperlipidemia     Procedure(s): VENOGRAM REMOVAL OF left femoral DIALYSIS CATHETER INSERTION OF left femoral DIALYSIS CATHETER  Discharged Condition: good  HPI:  Ebony [name] is a 78 y.o. female who presents with chief complaint: end stage renal disease and lack of permanent access. The patient presents today for tunneled dialysis catheter removal, left femoral venogram, possible tunneled dialysis catheter placement, and possible left thigh AVG placement with Accuseal.    Hospital Course:  Ebony [name] is a 78 y.o. female is S/P Left Procedure(s): VENOGRAM REMOVAL OF A DIALYSIS CATHETER INSERTION OF DIALYSIS CATHETER Extubated: post-op Post-op wounds healing well Pt. Ambulating, voiding and taking PO diet without difficulty. Pt pain controlled with PO pain meds. Labs as below Complications:none  Consults:     Significant Diagnostic Studies: CBC Lab Results    Component Value Date   WBC 6.7 06/01/2011   HGB 12.2 01/16/2012   HCT 36.0 01/16/2012   MCV 87.8 06/01/2011   PLT 228 06/01/2011    BMET    Component Value Date/Time   NA 140 01/16/2012 0814   K 3.8 01/16/2012 0814   CL 98 01/16/2012 0814   CO2 27 05/31/2011 0732   GLUCOSE 91 01/16/2012 0814   BUN 19 01/16/2012 0814   CREATININE 4.10* 01/16/2012 0814   CALCIUM 8.7 05/31/2011 0732   CALCIUM 8.6 07/07/2009 1725   GFRNONAA 7* 05/31/2011 0732   GFRAA 8* 05/31/2011 0732   COAG Lab Results  Component Value Date   INR 1.44 01/16/2012   INR 0.99 12/03/2011   INR 1.05 10/03/2011     Disposition:  Discharge to :Skilled nursing facility Discharge Orders    Future Orders Please Complete By Expires   Resume previous diet      Call MD for:  temperature >100.5      Call MD for:  redness, tenderness, or signs of infection (pain, swelling, bleeding, redness, odor or green/yellow discharge around incision site)      Call MD for:  severe or increased pain, loss or decreased feeling  in affected limb(s)      Increase activity slowly      Comments:   Walk with assistance use walker or cane as needed   Leave dressing on - Keep it clean, dry, and intact until clinic visit  [place], [place]  Home Medication Instructions [id number]   Printed on:01/16/12 1202  Medication Information                    omeprazole (PRILOSEC) 20 MG capsule Take 20 mg by mouth daily.            docusate sodium (COLACE) 100 MG capsule Take 100 mg by mouth 2 (two) times daily.            simvastatin (ZOCOR) 20 MG tablet Take 20 mg by mouth at bedtime.            calcium acetate (PHOSLO) 667 MG capsule Take 667 mg by mouth 3 (three) times daily with meals.            DULoxetine (CYMBALTA) 20 MG capsule Take 20 mg by mouth daily.           amLODipine (NORVASC) 5 MG tablet Take 5 mg by mouth at bedtime.           metoprolol tartrate (LOPRESSOR) 25 MG tablet Take 25 mg by mouth See admin instructions.  Takes 25 mg once a day and 25 mg twice a day  - on alternating days           insulin glargine (LANTUS) 100 UNIT/ML injection Inject 5 Units into the skin at bedtime.           insulin aspart (NOVOLOG) 100 UNIT/ML injection Inject 5 Units into the skin 2 (two) times daily before lunch and supper.            oxyCODONE-acetaminophen (PERCOCET/ROXICET) 5-325 MG per tablet Take 1-2 tablets by mouth every 4 (four) hours as needed. For pain.  Give 1 tablet for pain scale 1-5 and give 2 tablets for pain scale 6-10.           acetaminophen (TYLENOL) 325 MG tablet Take 325-650 mg by mouth every 4 (four) hours as needed. For headache.           bisacodyl (BISACODYL) 5 MG EC tablet Take 10 mg by mouth daily as needed. For constipation.           calcium carbonate, dosed in mg elemental calcium, 1250 MG/5ML Take 500 mg of elemental calcium by mouth every 6 (six) hours as needed. For acid reflux.           lidocaine-prilocaine (EMLA) cream Apply 1 application topically Every Tuesday,Thursday,and Saturday with dialysis.           b complex-vitamin c-folic acid (NEPHRO-VITE) 0.8 MG TABS Take 0.8 mg by mouth every morning.           feeding supplement (PRO-STAT SUGAR FREE 64) LIQD Take 30 mLs by mouth 3 (three) times daily with meals.           paricalcitol (ZEMPLAR) 2 MCG/ML injection Inject 1 mcg into the vein 3 (three) times a week. Inject during dialysis.           oxycodone (OXY-IR) 5 MG capsule Take 5 mg by mouth every 4 (four) hours as needed. For pain           guaiFENesin-dextromethorphan (ROBITUSSIN DM) 100-10 MG/5ML syrup Take 10 mLs by mouth every 6 (six) hours as needed. For cough           meclizine (ANTIVERT) 12.5 MG tablet Take 12.5 mg by mouth 3 (three) times daily as needed. For dizziness           Nutritional Supplements (  FEEDING SUPPLEMENT, NEPRO CARB STEADY,) LIQD Take 237 mLs by mouth 3 (three) times daily with meals.           warfarin (COUMADIN) 6 MG tablet Take 6  mg by mouth every evening.            Verbal and written Discharge instructions given to the patient. Wound care per Discharge AVS Follow-up Information    Follow up with [facility]. (As needed)    Contact information:   [address] [place] [address]          Signed: Marlowe Shores 01/16/2012, 12:02 PM  Addendum  I have independently interviewed and examined the patient, and I agree with the physician assistant's discharge summary.  Patient underwent a left tunneled dialysis catheter removal to facilitate sheath placement to complete a diagnostic venogram in the left pelvis.  Based on the venogram, she already has a significant hemodynamic stenosis in the left external iliac vein, which makes her not suitable for a thigh arteriovenous graft.  The left femoral tunneled dialysis catheter was then replaced to continue dialysis.  The patient will follow up in the office in 4-6 weeks for referral to [facility] for evaluation for tertiary access procedure.  Leonides Sake, MD [facility] [place] Office: [phone] Pager: [phone]  01/16/2012, 12:43 PM

## 2012-01-16 NOTE — Progress Notes (Signed)
Left groin dsg remains dry and intact. Pt is pain-free. VSS.  Report called to Lafonda Mosses at [facility]...she will continue to monitor groin dsg for potential bleeding.

## 2012-01-16 NOTE — Anesthesia Postprocedure Evaluation (Signed)
Anesthesia Post Note  Patient: Ebony [name]  Procedure(s) Performed: Procedure(s) (LRB): VENOGRAM () REMOVAL OF A DIALYSIS CATHETER (Left) INSERTION OF DIALYSIS CATHETER (Left)  Anesthesia type: general  Patient location: PACU  Post pain: Pain level controlled  Post assessment: Patient's Cardiovascular Status Stable  Last Vitals:  Filed Vitals:   01/16/12 1330  BP:   Pulse: 89  Temp:   Resp: 19    Post vital signs: Reviewed and stable  Level of consciousness: sedated  Complications: No apparent anesthesia complications

## 2012-01-16 NOTE — Transfer of Care (Signed)
Immediate Anesthesia Transfer of Care Note  Patient: Ebony [name]  Procedure(s) Performed: Procedure(s) (LRB) with comments: VENOGRAM () REMOVAL OF A DIALYSIS CATHETER (Left) INSERTION OF DIALYSIS CATHETER (Left)  Patient Location: PACU  Anesthesia Type:General  Level of Consciousness: patient cooperative and responds to stimulation  Airway & Oxygen Therapy: Patient Spontanous Breathing and Patient connected to face mask oxygen  Post-op Assessment: Report given to PACU RN and Post -op Vital signs reviewed and stable  Post vital signs: Reviewed and stable  Complications: No apparent anesthesia complications

## 2012-01-16 NOTE — Progress Notes (Signed)
P.A. In PACU. Steri-strips and pressure dsg applied. Site is dry and intact. Will continue to monitor.

## 2012-01-16 NOTE — Anesthesia Preprocedure Evaluation (Signed)
Anesthesia Evaluation  Patient identified by MRN, date of birth, ID band Patient awake    Reviewed: Allergy & Precautions, H&P , NPO status , Patient's Chart, lab work & pertinent test results, reviewed documented beta blocker date and time   Airway Mallampati: II TM Distance: >3 FB Neck ROM: Full    Dental  (+) Dental Advisory Given   Pulmonary pneumonia -, resolved,    Pulmonary exam normal       Cardiovascular hypertension, Pt. on home beta blockers + CAD     Neuro/Psych PSYCHIATRIC DISORDERS Depression CVA, Residual Symptoms    GI/Hepatic Neg liver ROS, GERD-  Medicated and Controlled,  Endo/Other  diabetes, Insulin Dependent  Renal/GU ESRFRenal disease     Musculoskeletal   Abdominal   Peds  Hematology   Anesthesia Other Findings   Reproductive/Obstetrics                           Anesthesia Physical Anesthesia Plan  ASA: III  Anesthesia Plan: General   Post-op Pain Management:    Induction: Intravenous  Airway Management Planned: LMA  Additional Equipment:   Intra-op Plan:   Post-operative Plan: Extubation in OR  Informed Consent: I have reviewed the patients History and Physical, chart, labs and discussed the procedure including the risks, benefits and alternatives for the proposed anesthesia with the patient or authorized representative who has indicated his/her understanding and acceptance.   Dental advisory given  Plan Discussed with: CRNA, Anesthesiologist and Surgeon  Anesthesia Plan Comments:         Anesthesia Quick Evaluation

## 2012-01-16 NOTE — Progress Notes (Signed)
Patient unable to stand for 2 view changed to 1 view

## 2012-01-16 NOTE — H&P (Addendum)
[facility] [place]  Brief History and Physical  History of Present Illness  Ebony [name] is a 78 y.o. female who presents with chief complaint: end stage renal disease and lack of permanent access.  The patient presents today for tunneled dialysis catheter removal, left femoral venogram, possible tunneled dialysis catheter placement, and possible left thigh AVG placement with Accuseal.  Past Medical History  Diagnosis Date  . Atrial fibrillation   . CAD (coronary artery disease)   . Hypertension   . Anemia   . MRSA bacteremia     hx  . Cellulitis   . GERD (gastroesophageal reflux disease)   . Acetabulum fracture   . Contracture of elbow     L arm  . Dementia   . Diabetes mellitus     on Insulin  . Decubitus ulcer, buttock 02/07/2011    Pt presents with dressing to R buttocks/coccyx.  . Pneumonia   . Blood transfusion   . Stroke ~ 1990's    Left side paralysis.  Left  foot drop., L arm contracted.  . Chronic kidney disease 03/13/11    dialysis at [facility]; TTS,last tx 03/12/11 hemo x 20years  . Depression   . Hyperlipidemia     Past Surgical History  Procedure Date  . Arteriovenous graft placement   . Appendectomy   . Cholecystectomy   . Tubal ligation     bilateral  . Abdominal hysterectomy   . Av fistula placement 02/07/2011    Procedure: INSERTION OF ARTERIOVENOUS (AV) GORE-TEX GRAFT THIGH;  Surgeon: Pryor Ochoa, MD;  Location: [facility] OR;  Service: Vascular;  Laterality: Right;  insertion AVGG right thigh  . Av fistula placement 02/12/2011    Procedure: INSERTION OF ARTERIOVENOUS (AV) GORE-TEX GRAFT THIGH;  Surgeon: Nilda Simmer, MD;  Location: [facility] OR;  Service: Vascular;  Laterality: Right;  . Cataract extraction w/ intraocular lens  implant, bilateral   . Avgg removal 05/24/2011    Procedure: REMOVAL OF ARTERIOVENOUS GORETEX GRAFT (AVGG);  Surgeon: Fransisco Hertz, MD;  Location: [facility] OR;  Service: Vascular;  Laterality: Right;  with  bovine patch angioplasty  . Application of wound vac 05/24/2011    Procedure: APPLICATION OF WOUND VAC;  Surgeon: Fransisco Hertz, MD;  Location: [facility] OR;  Service: Vascular;  Laterality: Right;  . Insertion of dialysis catheter 10/03/2011    Procedure: INSERTION OF DIALYSIS CATHETER;  Surgeon: Fransisco Hertz, MD;  Location: [facility] OR;  Service: Vascular;  Laterality: Left;  Insertion left femoral tunneled dialysis catheter    History   Social History  . Marital Status: Divorced    Spouse Name: N/A    Number of Children: N/A  . Years of Education: N/A   Occupational History  . Not on file.   Social History Main Topics  . Smoking status: Former Smoker -- 0.2 packs/day for 30 years    Types: Cigarettes    Quit date: 03/12/2000  . Smokeless tobacco: Never Used  . Alcohol Use: No     Comment: "last alcohol ~ 1990"  . Drug Use: No  . Sexually Active: No   Other Topics Concern  . Not on file   Social History Narrative  . No narrative on file    Family History  Problem Relation Age of Onset  . Diabetes Mother   . Hypertension Father   . Anesthesia problems Neg Hx   . Hypotension Neg Hx   . Malignant hyperthermia Neg Hx   .  Pseudochol deficiency Neg Hx     No current facility-administered medications on file prior to encounter.   Current Outpatient Prescriptions on File Prior to Encounter  Medication Sig Dispense Refill  . acetaminophen (TYLENOL) 325 MG tablet Take 325-650 mg by mouth every 4 (four) hours as needed. For headache.      Marland Kitchen amLODipine (NORVASC) 5 MG tablet Take 5 mg by mouth at bedtime.      Marland Kitchen b complex-vitamin c-folic acid (NEPHRO-VITE) 0.8 MG TABS Take 0.8 mg by mouth every morning.      . bisacodyl (BISACODYL) 5 MG EC tablet Take 10 mg by mouth daily as needed. For constipation.      . calcium acetate (PHOSLO) 667 MG capsule Take 667 mg by mouth 3 (three) times daily with meals.       . calcium carbonate, dosed in mg elemental calcium, 1250 MG/5ML Take 500 mg of  elemental calcium by mouth every 6 (six) hours as needed. For acid reflux.      . docusate sodium (COLACE) 100 MG capsule Take 100 mg by mouth 2 (two) times daily.       . DULoxetine (CYMBALTA) 20 MG capsule Take 20 mg by mouth daily.      . feeding supplement (PRO-STAT SUGAR FREE 64) LIQD Take 30 mLs by mouth 3 (three) times daily with meals.      . insulin aspart (NOVOLOG) 100 UNIT/ML injection Inject 5 Units into the skin 2 (two) times daily before lunch and supper.       . insulin glargine (LANTUS) 100 UNIT/ML injection Inject 5 Units into the skin at bedtime.      . lidocaine-prilocaine (EMLA) cream Apply 1 application topically Every Tuesday,Thursday,and Saturday with dialysis.      Marland Kitchen metoprolol tartrate (LOPRESSOR) 25 MG tablet Take 25 mg by mouth See admin instructions. Takes 25 mg once a day and 25 mg twice a day  - on alternating days      . omeprazole (PRILOSEC) 20 MG capsule Take 20 mg by mouth daily.       Marland Kitchen oxycodone (OXY-IR) 5 MG capsule Take 5 mg by mouth every 4 (four) hours as needed. For pain      . oxyCODONE-acetaminophen (PERCOCET/ROXICET) 5-325 MG per tablet Take 1-2 tablets by mouth every 4 (four) hours as needed. For pain.  Give 1 tablet for pain scale 1-5 and give 2 tablets for pain scale 6-10.      Marland Kitchen paricalcitol (ZEMPLAR) 2 MCG/ML injection Inject 1 mcg into the vein 3 (three) times a week. Inject during dialysis.      Marland Kitchen simvastatin (ZOCOR) 20 MG tablet Take 20 mg by mouth at bedtime.         Allergies  Allergen Reactions  . Aspirin     Unknown    . Heparin     SHE is HIT neg; platelets have been normal on low dose heparin at out pt dialysis ; not clear why this is listed   . Minoxidil     Unknown    . Penicillins     Unknown      Review of Systems: As listed above, otherwise negative.  Physical Examination  Filed Vitals:   01/15/12 1651 01/16/12 0748  BP:  127/72  Pulse:  53  Temp:  97.9 F (36.6 C)  TempSrc:  Oral  Resp:  18  Height: 5\' 2"   (1.575 m)   Weight: 141 lb (63.957 kg)   SpO2:  96%  General: A&O x 3, WDWN  Pulmonary: Sym exp, good air movt, CTAB, no rales, rhonchi, & wheezing  Cardiac: RRR, Nl S1, S2, no Murmurs, rubs or gallops  Gastrointestinal: soft, NTND, -G/R, - HSM, - masses, - CVAT B  Musculoskeletal: M/S 5/5 throughout , Extremities without ischemic changes , TDC in left groin  Laboratory See iStat  Medical Decision Making  CHEROKEE BOCCIO is a 78 y.o. female who presents with: end stage renal disease.   The patient is scheduled for: Left femoral tunneled dialysis catheter removal. Left femoral venogram, possible left tunneled dialysis catheter placement, and possible left thigh AVG placement.  She had a previous right thigh AVG placement complicated by venous hypertension due to iliac vein stenosis due to long-term tunneled dialysis catheter presence.  Given the left femoral tunneled dialysis catheter has been in place for >6 months, I suspect the same problems in this left iliac venous system.  Hence, I discussed with the patient investigating this prior to proceeding with graft placement.  If the iliac venous system already has a stenosis, I would terminate the procedure and replace the tunneled dialysis catheter to avoid the venous stenosis complications that occurred previously.   I discussed with the patient the nature of angiographic procedures, especially the limited patencies of any endovascular intervention.  The patient is aware of that the risks of an angiographic procedure include but are not limited to: bleeding, infection, access site complications, renal failure, embolization, rupture of vessel, dissection, possible need for emergent surgical intervention, possible need for surgical procedures to treat the patient's pathology, and stroke and death.    Risk, benefits, and alternatives to access surgery were discussed.  The patient is aware the risks include but are not limited to:  bleeding, infection, steal syndrome, nerve damage, ischemic monomelic neuropathy, failure to mature, and need for additional procedures. The patient is aware the risks of tunneled dialysis catheter placement include but are not limited to: bleeding, infection, central venous injury, pneumothorax, possible venous stenosis, possible malpositioning in the venous system, and possible infections related to long-term catheter presence.   The patient is aware of the risks and agrees to proceed.  Leonides Sake, MD [facility] [place] Office: [phone] Pager: [phone]  01/16/2012, 8:07 AM

## 2012-01-16 NOTE — Progress Notes (Signed)
Contacted pt.'s ride. They will pick her up in short stay at 3:15. Pt. Given crackers and peanut butter and a diet soda.

## 2012-01-17 ENCOUNTER — Encounter (HOSPITAL_COMMUNITY): Payer: Self-pay | Admitting: Vascular Surgery

## 2012-01-18 ENCOUNTER — Telehealth: Payer: Self-pay | Admitting: Vascular Surgery

## 2012-01-18 NOTE — Telephone Encounter (Signed)
Message copied by Margaretmary Eddy on Fri Jan 18, 2012  1:14 PM ------      Message from: Melene Plan      Created: Wed Jan 16, 2012  2:12 PM                   ----- Message -----         From: Fransisco Hertz, MD         Sent: 01/16/2012  12:43 PM           To: Reuel Derby, Melene Plan, RN            Ebony [name]      [id number]      01/28/1930            PROCEDURE:      1. Left femoral tunneled dialysis catheter removal      2. Left pelvic venogram      3. Left femoral tunneled dialysis catheter placement            Follow-up: 4-6 weeks

## 2012-02-18 ENCOUNTER — Ambulatory Visit: Payer: Self-pay | Admitting: Vascular Surgery

## 2012-02-18 LAB — CBC
HGB: 10.3 g/dL — ABNORMAL LOW (ref 12.0–16.0)
MCHC: 32.3 g/dL (ref 32.0–36.0)
WBC: 3.1 10*3/uL — ABNORMAL LOW (ref 3.6–11.0)

## 2012-02-18 LAB — BASIC METABOLIC PANEL
BUN: 35 mg/dL — ABNORMAL HIGH (ref 7–18)
Chloride: 99 mmol/L (ref 98–107)
Co2: 30 mmol/L (ref 21–32)
Creatinine: 6.4 mg/dL — ABNORMAL HIGH (ref 0.60–1.30)
EGFR (African American): 7 — ABNORMAL LOW

## 2012-02-22 ENCOUNTER — Ambulatory Visit: Payer: Self-pay | Admitting: Vascular Surgery

## 2012-02-27 ENCOUNTER — Ambulatory Visit: Payer: Self-pay | Admitting: Vascular Surgery

## 2012-02-27 LAB — BASIC METABOLIC PANEL
Chloride: 100 mmol/L (ref 98–107)
Co2: 33 mmol/L — ABNORMAL HIGH (ref 21–32)
Creatinine: 3.85 mg/dL — ABNORMAL HIGH (ref 0.60–1.30)
Potassium: 3.3 mmol/L — ABNORMAL LOW (ref 3.5–5.1)
Sodium: 138 mmol/L (ref 136–145)

## 2012-02-27 LAB — PROTIME-INR
INR: 1.2
Prothrombin Time: 15.8 secs — ABNORMAL HIGH (ref 11.5–14.7)

## 2012-02-28 ENCOUNTER — Ambulatory Visit: Payer: Self-pay | Admitting: Vascular Surgery

## 2012-02-28 LAB — PROTIME-INR: INR: 1

## 2012-02-28 LAB — POTASSIUM: Potassium: 3.6 mmol/L (ref 3.5–5.1)

## 2012-02-29 LAB — CBC WITH DIFFERENTIAL/PLATELET
Basophil %: 0.5 %
Eosinophil #: 0.1 10*3/uL (ref 0.0–0.7)
HCT: 27.5 % — ABNORMAL LOW (ref 35.0–47.0)
HGB: 9 g/dL — ABNORMAL LOW (ref 12.0–16.0)
MCV: 89 fL (ref 80–100)
Monocyte #: 0.6 x10 3/mm (ref 0.2–0.9)
Neutrophil #: 3.5 10*3/uL (ref 1.4–6.5)
Neutrophil %: 70.4 %
Platelet: 159 10*3/uL (ref 150–440)
RBC: 3.1 10*6/uL — ABNORMAL LOW (ref 3.80–5.20)
WBC: 5 10*3/uL (ref 3.6–11.0)

## 2012-02-29 LAB — BASIC METABOLIC PANEL
BUN: 36 mg/dL — ABNORMAL HIGH (ref 7–18)
EGFR (African American): 5 — ABNORMAL LOW
EGFR (Non-African Amer.): 5 — ABNORMAL LOW
Glucose: 85 mg/dL (ref 65–99)
Potassium: 3.7 mmol/L (ref 3.5–5.1)

## 2012-02-29 LAB — PHOSPHORUS: Phosphorus: 4.4 mg/dL (ref 2.5–4.9)

## 2012-03-28 ENCOUNTER — Encounter (HOSPITAL_COMMUNITY): Payer: Self-pay | Admitting: Emergency Medicine

## 2012-03-28 ENCOUNTER — Observation Stay (HOSPITAL_COMMUNITY): Payer: Medicare Other

## 2012-03-28 ENCOUNTER — Inpatient Hospital Stay (HOSPITAL_COMMUNITY)
Admission: EM | Admit: 2012-03-28 | Discharge: 2012-03-31 | DRG: 377 | Disposition: A | Discharge status: Home / Self Care | Payer: Medicare Other | Attending: Internal Medicine | Admitting: Internal Medicine

## 2012-03-28 DIAGNOSIS — L0390 Cellulitis, unspecified: Secondary | ICD-10-CM

## 2012-03-28 DIAGNOSIS — F3289 Other specified depressive episodes: Secondary | ICD-10-CM | POA: Diagnosis present

## 2012-03-28 DIAGNOSIS — N186 End stage renal disease: Secondary | ICD-10-CM | POA: Diagnosis present

## 2012-03-28 DIAGNOSIS — Z7901 Long term (current) use of anticoagulants: Secondary | ICD-10-CM

## 2012-03-28 DIAGNOSIS — I2510 Atherosclerotic heart disease of native coronary artery without angina pectoris: Secondary | ICD-10-CM | POA: Diagnosis present

## 2012-03-28 DIAGNOSIS — M62838 Other muscle spasm: Secondary | ICD-10-CM | POA: Diagnosis present

## 2012-03-28 DIAGNOSIS — Z992 Dependence on renal dialysis: Secondary | ICD-10-CM | POA: Diagnosis present

## 2012-03-28 DIAGNOSIS — N2581 Secondary hyperparathyroidism of renal origin: Secondary | ICD-10-CM | POA: Diagnosis present

## 2012-03-28 DIAGNOSIS — D72819 Decreased white blood cell count, unspecified: Secondary | ICD-10-CM

## 2012-03-28 DIAGNOSIS — F068 Other specified mental disorders due to known physiological condition: Secondary | ICD-10-CM

## 2012-03-28 DIAGNOSIS — D696 Thrombocytopenia, unspecified: Secondary | ICD-10-CM

## 2012-03-28 DIAGNOSIS — D62 Acute posthemorrhagic anemia: Secondary | ICD-10-CM

## 2012-03-28 DIAGNOSIS — F329 Major depressive disorder, single episode, unspecified: Secondary | ICD-10-CM | POA: Diagnosis present

## 2012-03-28 DIAGNOSIS — E211 Secondary hyperparathyroidism, not elsewhere classified: Secondary | ICD-10-CM

## 2012-03-28 DIAGNOSIS — I69359 Hemiplegia and hemiparesis following cerebral infarction affecting unspecified side: Secondary | ICD-10-CM

## 2012-03-28 DIAGNOSIS — R791 Abnormal coagulation profile: Secondary | ICD-10-CM | POA: Diagnosis present

## 2012-03-28 DIAGNOSIS — D638 Anemia in other chronic diseases classified elsewhere: Secondary | ICD-10-CM

## 2012-03-28 DIAGNOSIS — Z794 Long term (current) use of insulin: Secondary | ICD-10-CM

## 2012-03-28 DIAGNOSIS — I4891 Unspecified atrial fibrillation: Secondary | ICD-10-CM | POA: Diagnosis present

## 2012-03-28 DIAGNOSIS — E876 Hypokalemia: Secondary | ICD-10-CM | POA: Diagnosis not present

## 2012-03-28 DIAGNOSIS — R7881 Bacteremia: Secondary | ICD-10-CM | POA: Diagnosis present

## 2012-03-28 DIAGNOSIS — I10 Essential (primary) hypertension: Secondary | ICD-10-CM

## 2012-03-28 DIAGNOSIS — A4902 Methicillin resistant Staphylococcus aureus infection, unspecified site: Secondary | ICD-10-CM

## 2012-03-28 DIAGNOSIS — K922 Gastrointestinal hemorrhage, unspecified: Secondary | ICD-10-CM | POA: Diagnosis present

## 2012-03-28 DIAGNOSIS — F0390 Unspecified dementia without behavioral disturbance: Secondary | ICD-10-CM | POA: Diagnosis present

## 2012-03-28 DIAGNOSIS — I6340 Cerebral infarction due to embolism of unspecified cerebral artery: Secondary | ICD-10-CM

## 2012-03-28 DIAGNOSIS — I429 Cardiomyopathy, unspecified: Secondary | ICD-10-CM

## 2012-03-28 DIAGNOSIS — K5731 Diverticulosis of large intestine without perforation or abscess with bleeding: Principal | ICD-10-CM | POA: Diagnosis present

## 2012-03-28 DIAGNOSIS — E119 Type 2 diabetes mellitus without complications: Secondary | ICD-10-CM | POA: Diagnosis present

## 2012-03-28 DIAGNOSIS — A498 Other bacterial infections of unspecified site: Secondary | ICD-10-CM | POA: Diagnosis present

## 2012-03-28 DIAGNOSIS — E785 Hyperlipidemia, unspecified: Secondary | ICD-10-CM | POA: Diagnosis present

## 2012-03-28 DIAGNOSIS — K219 Gastro-esophageal reflux disease without esophagitis: Secondary | ICD-10-CM | POA: Diagnosis present

## 2012-03-28 DIAGNOSIS — I69959 Hemiplegia and hemiparesis following unspecified cerebrovascular disease affecting unspecified side: Secondary | ICD-10-CM

## 2012-03-28 DIAGNOSIS — I120 Hypertensive chronic kidney disease with stage 5 chronic kidney disease or end stage renal disease: Secondary | ICD-10-CM | POA: Diagnosis present

## 2012-03-28 LAB — CBC WITH DIFFERENTIAL/PLATELET
Eosinophils Absolute: 0.1 10*3/uL (ref 0.0–0.7)
Eosinophils Relative: 1 % (ref 0–5)
Hemoglobin: 7.8 g/dL — ABNORMAL LOW (ref 12.0–15.0)
Lymphs Abs: 1.2 10*3/uL (ref 0.7–4.0)
MCH: 27.4 pg (ref 26.0–34.0)
MCHC: 31 g/dL (ref 30.0–36.0)
MCV: 88.4 fL (ref 78.0–100.0)
Monocytes Relative: 8 % (ref 3–12)
Platelets: 235 10*3/uL (ref 150–400)
RBC: 2.85 MIL/uL — ABNORMAL LOW (ref 3.87–5.11)

## 2012-03-28 LAB — CBC
HCT: 26.4 % — ABNORMAL LOW (ref 36.0–46.0)
Hemoglobin: 8.8 g/dL — ABNORMAL LOW (ref 12.0–15.0)
RBC: 3.03 MIL/uL — ABNORMAL LOW (ref 3.87–5.11)
WBC: 8.3 10*3/uL (ref 4.0–10.5)

## 2012-03-28 LAB — BASIC METABOLIC PANEL
BUN: 15 mg/dL (ref 6–23)
Calcium: 9.2 mg/dL (ref 8.4–10.5)
Creatinine, Ser: 3.98 mg/dL — ABNORMAL HIGH (ref 0.50–1.10)
GFR calc non Af Amer: 10 mL/min — ABNORMAL LOW (ref >90)
Glucose, Bld: 230 mg/dL — ABNORMAL HIGH (ref 70–99)

## 2012-03-28 LAB — OCCULT BLOOD, POC DEVICE: Fecal Occult Bld: POSITIVE — AB

## 2012-03-28 LAB — POCT I-STAT TROPONIN I

## 2012-03-28 LAB — PROTIME-INR: INR: 3.01 — ABNORMAL HIGH (ref 0.00–1.49)

## 2012-03-28 MED ORDER — SODIUM CHLORIDE 0.9 % IV BOLUS (SEPSIS): 1000.0000 mL | Freq: Once | INTRAVENOUS | Status: DC

## 2012-03-28 MED ORDER — SODIUM CHLORIDE 0.9 % IV SOLN
80.0000 mg | Freq: Once | INTRAVENOUS | Status: AC
  Administered 2012-03-28: 80 mg via INTRAVENOUS
  Filled 2012-03-28: qty 80

## 2012-03-28 MED ORDER — SODIUM CHLORIDE 0.9 % IV SOLN
8.0000 mg/h | INTRAVENOUS | Status: DC
  Administered 2012-03-28: 8 mg/h via INTRAVENOUS
  Filled 2012-03-28 (×4): qty 80

## 2012-03-28 MED ORDER — SODIUM CHLORIDE 0.9 % IV BOLUS (SEPSIS)
500.0000 mL | Freq: Once | INTRAVENOUS | Status: AC
  Administered 2012-03-28: 500 mL via INTRAVENOUS

## 2012-03-28 MED ORDER — PHYTONADIONE 5 MG PO TABS
5.0000 mg | ORAL_TABLET | Freq: Once | ORAL | Status: AC
  Administered 2012-03-28: 5 mg via ORAL
  Filled 2012-03-28: qty 1

## 2012-03-28 MED ORDER — ACETAMINOPHEN 325 MG PO TABS
650.0000 mg | ORAL_TABLET | Freq: Four times a day (QID) | ORAL | Status: DC | PRN
  Administered 2012-03-28 – 2012-03-29 (×2): 650 mg via ORAL
  Filled 2012-03-28 (×2): qty 2

## 2012-03-28 NOTE — ED Provider Notes (Signed)
Medical screening examination/treatment/procedure(s) were conducted as a shared visit with non-physician practitioner(s) and myself.  I personally evaluated the patient during the encounter  Patient with evidence of lower GI bleed and with prior of blood on exam.  She is super therapeutic with her INR.  By mouth vitamin K now.  FFP now.  Patient be admitted to the step down unit.  5 g drop in her hemoglobin last 2 months.  Lyanne Co, MD 03/28/12 [phone]

## 2012-03-28 NOTE — Consult Note (Signed)
[facility] Consultation  Referring Provider: [facility]   Primary Care Physician:  Johny Sax, MD Primary Gastroenterologist:  Gentry Fitz  Reason for Consultation:    GI bleed  HPI: Ebony [name] is a 79 y.o. female with ESRD on HD transferred from nursing home with rectal bleeding. She answers some questions appropriately but admits to memory deficits from stroke. Patient tells me the aide at nursing home told her she was bleeding. Patient doesn't recall any abdominal pain. No nausea. Patient is on coumadin, INR 3. Hgb 7.8, it was 12.2 in November. She takes a daily PPI, presumably for GERD   Past Medical History  Diagnosis Date  . Atrial fibrillation   . CAD (coronary artery disease)   . Hypertension   . Anemia   . MRSA bacteremia     hx  . Cellulitis   . GERD (gastroesophageal reflux disease)   . Acetabulum fracture   . Contracture of elbow     L arm  . Dementia   . Diabetes mellitus     on Insulin  . Decubitus ulcer, buttock 02/07/2011    Pt presents with dressing to R buttocks/coccyx.  . Pneumonia   . Blood transfusion   . Stroke ~ 1990's    Left side paralysis.  Left  foot drop., L arm contracted.  . Chronic kidney disease 03/13/11    dialysis at [facility]; TTS,last tx 03/12/11 hemo x 20years  . Depression   . Hyperlipidemia     Past Surgical History  Procedure Date  . Arteriovenous graft placement   . Appendectomy   . Cholecystectomy   . Tubal ligation     bilateral  . Abdominal hysterectomy   . Av fistula placement 02/07/2011    Procedure: INSERTION OF ARTERIOVENOUS (AV) GORE-TEX GRAFT THIGH;  Surgeon: Pryor Ochoa, MD;  Location: [facility] OR;  Service: Vascular;  Laterality: Right;  insertion AVGG right thigh  . Av fistula placement 02/12/2011    Procedure: INSERTION OF ARTERIOVENOUS (AV) GORE-TEX GRAFT THIGH;  Surgeon: Nilda Simmer, MD;  Location: [facility] OR;  Service: Vascular;  Laterality: Right;  . Cataract extraction w/  intraocular lens  implant, bilateral   . Avgg removal 05/24/2011    Procedure: REMOVAL OF ARTERIOVENOUS GORETEX GRAFT (AVGG);  Surgeon: Fransisco Hertz, MD;  Location: [facility] OR;  Service: Vascular;  Laterality: Right;  with bovine patch angioplasty  . Application of wound vac 05/24/2011    Procedure: APPLICATION OF WOUND VAC;  Surgeon: Fransisco Hertz, MD;  Location: [facility] OR;  Service: Vascular;  Laterality: Right;  . Insertion of dialysis catheter 10/03/2011    Procedure: INSERTION OF DIALYSIS CATHETER;  Surgeon: Fransisco Hertz, MD;  Location: [facility] OR;  Service: Vascular;  Laterality: Left;  Insertion left femoral tunneled dialysis catheter  . Removal of a dialysis catheter 01/16/2012    Procedure: REMOVAL OF A DIALYSIS CATHETER;  Surgeon: Fransisco Hertz, MD;  Location: [facility] OR;  Service: Vascular;  Laterality: Left;  . Insertion of dialysis catheter 01/16/2012    Procedure: INSERTION OF DIALYSIS CATHETER;  Surgeon: Fransisco Hertz, MD;  Location: [facility] OR;  Service: Vascular;  Laterality: Left;    Family History  Problem Relation Age of Onset  . Diabetes Mother   . Hypertension Father   . Anesthesia problems Neg Hx   . Hypotension Neg Hx   . Malignant hyperthermia Neg Hx   . Pseudochol deficiency Neg Hx     History  Substance Use Topics  .  Smoking status: Former Smoker -- 0.2 packs/day for 30 years    Types: Cigarettes    Quit date: 03/12/2000  . Smokeless tobacco: Never Used  . Alcohol Use: No     Comment: "last alcohol ~ 1990"    Prior to Admission medications   Medication Sig Start Date End Date Taking? Authorizing Provider  acetaminophen (TYLENOL) 325 MG tablet Take 325-650 mg by mouth every 4 (four) hours as needed. For headache.   Yes Historical Provider, MD  amLODipine (NORVASC) 5 MG tablet Take 5 mg by mouth at bedtime. 03/17/11  Yes Srikar Cherlynn Kaiser, MD  calcium acetate (PHOSLO) 667 MG capsule Take 667 mg by mouth 3 (three) times daily with meals.  09/01/10  Yes Historical Provider, MD  darbepoetin  (ARANESP) 200 MCG/0.4ML SOLN Inject 200 mcg into the vein every Thursday with hemodialysis. Thursday with dialysis   Yes Historical Provider, MD  docusate sodium (COLACE) 100 MG capsule Take 100 mg by mouth 2 (two) times daily.    Yes Historical Provider, MD  DULoxetine (CYMBALTA) 20 MG capsule Take 20 mg by mouth daily before breakfast.    Yes Historical Provider, MD  insulin aspart (NOVOLOG) 100 UNIT/ML injection Inject 5 Units into the skin 2 (two) times daily before lunch and supper.    Yes Historical Provider, MD  insulin glargine (LANTUS) 100 UNIT/ML injection Inject 5 Units into the skin at bedtime.   Yes Historical Provider, MD  lidocaine-prilocaine (EMLA) cream Apply 1 application topically Every Tuesday,Thursday,and Saturday with dialysis.   Yes Historical Provider, MD  Menthol-Zinc Oxide (REMEDY CALAZIME EX) Apply 1 application topically daily. To sacrum   Yes Historical Provider, MD  metoprolol tartrate (LOPRESSOR) 25 MG tablet Take 25 mg by mouth See admin instructions. Twice a day on Sunday, Monday, Wednesday, Friday; once daily after dialysis on Tuesday, Thursday, Saturday   Yes Historical Provider, MD  multivitamin (RENA-VIT) TABS tablet Take 1 tablet by mouth daily.   Yes Historical Provider, MD  Nutritional Supplements (FEEDING SUPPLEMENT, NEPRO CARB STEADY,) LIQD Take 237 mLs by mouth 2 (two) times daily with a meal.    Yes Historical Provider, MD  omeprazole (PRILOSEC) 20 MG capsule Take 40 mg by mouth daily.    Yes Historical Provider, MD  oxyCODONE-acetaminophen (PERCOCET/ROXICET) 5-325 MG per tablet Take 1-2 tablets by mouth every 4 (four) hours as needed. For pain.  Give 1 tablet for pain scale 1-5 and give 2 tablets for pain scale 6-10.   Yes Historical Provider, MD  paricalcitol (ZEMPLAR) 2 MCG/ML injection Inject 1 mcg into the vein 3 (three) times a week. Inject during dialysis.   Yes Historical Provider, MD  simvastatin (ZOCOR) 20 MG tablet Take 20 mg by mouth at bedtime.     Yes Historical Provider, MD  warfarin (COUMADIN) 5 MG tablet Take 5 mg by mouth daily.   Yes Historical Provider, MD    Current Facility-Administered Medications  Medication Dose Route Frequency Provider Last Rate Last Dose  . pantoprazole (PROTONIX) 80 mg in sodium chloride 0.9 % 100 mL IVPB  80 mg Intravenous Once Pamella Pert, MD      . pantoprazole (PROTONIX) 80 mg in sodium chloride 0.9 % 250 mL infusion  8 mg/hr Intravenous Continuous Costin Gherghe, MD      . phytonadione (VITAMIN K) tablet 5 mg  5 mg Oral Once Fayrene Helper, PA-C       Current Outpatient Prescriptions  Medication Sig Dispense Refill  . acetaminophen (TYLENOL) 325 MG tablet Take  325-650 mg by mouth every 4 (four) hours as needed. For headache.      Marland Kitchen amLODipine (NORVASC) 5 MG tablet Take 5 mg by mouth at bedtime.      . calcium acetate (PHOSLO) 667 MG capsule Take 667 mg by mouth 3 (three) times daily with meals.       . darbepoetin (ARANESP) 200 MCG/0.4ML SOLN Inject 200 mcg into the vein every Thursday with hemodialysis. Thursday with dialysis      . docusate sodium (COLACE) 100 MG capsule Take 100 mg by mouth 2 (two) times daily.       . DULoxetine (CYMBALTA) 20 MG capsule Take 20 mg by mouth daily before breakfast.       . insulin aspart (NOVOLOG) 100 UNIT/ML injection Inject 5 Units into the skin 2 (two) times daily before lunch and supper.       . insulin glargine (LANTUS) 100 UNIT/ML injection Inject 5 Units into the skin at bedtime.      . lidocaine-prilocaine (EMLA) cream Apply 1 application topically Every Tuesday,Thursday,and Saturday with dialysis.      . Menthol-Zinc Oxide (REMEDY CALAZIME EX) Apply 1 application topically daily. To sacrum      . metoprolol tartrate (LOPRESSOR) 25 MG tablet Take 25 mg by mouth See admin instructions. Twice a day on Sunday, Monday, Wednesday, Friday; once daily after dialysis on Tuesday, Thursday, Saturday      . multivitamin (RENA-VIT) TABS tablet Take 1 tablet by mouth  daily.      . Nutritional Supplements (FEEDING SUPPLEMENT, NEPRO CARB STEADY,) LIQD Take 237 mLs by mouth 2 (two) times daily with a meal.       . omeprazole (PRILOSEC) 20 MG capsule Take 40 mg by mouth daily.       Marland Kitchen oxyCODONE-acetaminophen (PERCOCET/ROXICET) 5-325 MG per tablet Take 1-2 tablets by mouth every 4 (four) hours as needed. For pain.  Give 1 tablet for pain scale 1-5 and give 2 tablets for pain scale 6-10.      Marland Kitchen paricalcitol (ZEMPLAR) 2 MCG/ML injection Inject 1 mcg into the vein 3 (three) times a week. Inject during dialysis.      Marland Kitchen simvastatin (ZOCOR) 20 MG tablet Take 20 mg by mouth at bedtime.       Marland Kitchen warfarin (COUMADIN) 5 MG tablet Take 5 mg by mouth daily.        Allergies as of 03/28/2012 - Review Complete 03/28/2012  Allergen Reaction Noted  . Aspirin    . Heparin    . Minoxidil    . Penicillins  01/11/2011     Review of Systems:    Unreliable, memory deficits from CVA  PHYSICAL EXAM: Vital signs in last 24 hours: Temp:  [99.5 F (37.5 C)] 99.5 F (37.5 C) (01/17 1437) Pulse Rate:  [66-78] 78  (01/17 1615) Resp:  [18-29] 25  (01/17 1615) BP: (113-117)/(34-90) 113/90 mmHg (01/17 1615) SpO2:  [90 %-92 %] 92 % (01/17 1615)   General:   Pleasant black female in NAD Head:  Normocephalic and atraumatic. Eyes:   No icterus.   Conjunctiva pale. Ears:  Normal auditory acuity. Neck:  Supple; no masses felt Lungs:  Respirations even and unlabored. Lungs clear to auscultation bilaterally.   No wheezes, crackles, or rhonchi.  Heart:  Occasional irreg beat.  Murmur heard. Abdomen:  Soft, nondistended, mild LLQ tenderness. Normal bowel sounds. No appreciable masses or hepatomegaly.  Rectal:  No external lesions. On DRE there was a scant amount of fresh  blood Msk:  Symmetrical without gross deformities. Extremities:  Without edema. Multiple surgical scars to lower extremities. LUE paresis Neurologic:  Alert and Skin:  Intact without significant lesions or  rashes. Cervical Nodes:  No significant cervical adenopathy. Psych:  Alert and cooperative.Periodic crying "depressed"   LAB RESULTS:  Basename 03/28/12 1356  WBC 7.2  HGB 7.8*  HCT 25.2*  PLT 235   BMET  Basename 03/28/12 1356  NA 134*  K 3.5  CL 92*  CO2 31  GLUCOSE 230*  BUN 15  CREATININE 3.98*  CALCIUM 9.2   PT/INR  Basename 03/28/12 1356  LABPROT 29.6*  INR 3.01*     PREVIOUS ENDOSCOPIES: none upon review of records. Patient doesn't know of any  IMPRESSION / PLAN: 39. 79 year old female with hematochezia in setting of anticoagulation with coumadin. No reports of pain but patient has some cognitive deficits from CVA. She is mildly tender in LLQ. Patient is hemodynamically stable, suspect lower GI bleed rather than a brisk upper bleed. Suspect this is a diverticular hemorrhage.vrs ischemic colitis (less likely).  Agree with unit of blood as ordered.    Continue PPI for now (until certain not upper bleed)  Vitamin K already given (5mg  PO x1). Recheck am INR.  FFP has been ordered.   If bleeding persists she may need bleeding scan. Patient will need a colonoscopy at some point.   She can have sips of clear from GI standpoint.  Monitor fluid status. She got IV fluid bolus in ED but not on maintenance IVF (HD patient)  2. ESRD, on HD  3. Multiple other medical problems: CVA, Afib, CAD, dementia, depression.   4. Chronic anticoagulation with coumadin (afib, CVA). INR being reversed.  Thanks   LOS: 0 days   Willette Cluster  03/28/2012, 4:19 PM  Chart was reviewed and patient was examined. X-rays and lab were reviewed.    I agree with management and plans.  Suspect LGI bleed - divertivular source most likely though AVMs and ischemic colitis are other possibilities.  Plan as above.   Barbette Hair. Arlyce Dice, M.D., [facility] Cell [phone]

## 2012-03-28 NOTE — H&P (Signed)
[facility] History and Physical  Lelania Bia Crumbley JYN:[id number] DOB: [birth year] DOA: 03/28/2012  Referring physician: Dr. Patria Mane PCP: Johny Sax, MD  Specialists:   Chief Complaint: GI bleed  HPI: Ebony [name] is a 79 y.o. female  This is an 79 year old female with a history of end-stage renal disease on dialysis, coronary artery disease, atrial fibrillation, past CVAs, currently on Coumadin, who presents from a nursing facility with a chief complaint of lightheadedness and dizziness for the past few days as well as lower GI bleed. Nursing staff at the nursing facility have noticed that she has bright red blood in her stools. A hemoglobin was checked and returned of value of less than 7. She was transferred to [facility]. Repeat hemoglobin here is 7.8. Her most recent hemoglobin in our system was done in November of 2013 and it was 12. She denies having a GI bleed in the past. She denies having a gastroenterologist however she she states that she does not recall. She denies ever having a colonoscopy or upper endoscopy however she's not sure. She is very emotional stating that she is 79 years old and can't remember these things. She endorses lightheadedness and dizziness. She endorses feeling tired. She denies taking other medications such as NSAIDs except for what is on her medication list. Because of her bleed at the nursing facility her Coumadin has been on hold since January 14.  Review of Systems: The patient denies chest pain, shortness of breath, or palpitations. Otherwise her review of systems as per the history of present illness. She endorses left-sided residual weakness from her strokes and can only walk with assistance.She denies any nausea or vomiting. She denies any abdominal pain.   Past Medical History  Diagnosis Date  . Atrial fibrillation   . CAD (coronary artery disease)   . Hypertension   . Anemia   . MRSA bacteremia     hx  . Cellulitis   . GERD  (gastroesophageal reflux disease)   . Acetabulum fracture   . Contracture of elbow     L arm  . Dementia   . Diabetes mellitus     on Insulin  . Decubitus ulcer, buttock 02/07/2011    Pt presents with dressing to R buttocks/coccyx.  . Pneumonia   . Blood transfusion   . Stroke ~ 1990's    Left side paralysis.  Left  foot drop., L arm contracted.  . Chronic kidney disease 03/13/11    dialysis at [facility]; TTS,last tx 03/12/11 hemo x 20years  . Depression   . Hyperlipidemia    Past Surgical History  Procedure Date  . Arteriovenous graft placement   . Appendectomy   . Cholecystectomy   . Tubal ligation     bilateral  . Abdominal hysterectomy   . Av fistula placement 02/07/2011    Procedure: INSERTION OF ARTERIOVENOUS (AV) GORE-TEX GRAFT THIGH;  Surgeon: Pryor Ochoa, MD;  Location: [facility] OR;  Service: Vascular;  Laterality: Right;  insertion AVGG right thigh  . Av fistula placement 02/12/2011    Procedure: INSERTION OF ARTERIOVENOUS (AV) GORE-TEX GRAFT THIGH;  Surgeon: Nilda Simmer, MD;  Location: [facility] OR;  Service: Vascular;  Laterality: Right;  . Cataract extraction w/ intraocular lens  implant, bilateral   . Avgg removal 05/24/2011    Procedure: REMOVAL OF ARTERIOVENOUS GORETEX GRAFT (AVGG);  Surgeon: Fransisco Hertz, MD;  Location: [facility] OR;  Service: Vascular;  Laterality: Right;  with bovine patch angioplasty  . Application of  wound vac 05/24/2011    Procedure: APPLICATION OF WOUND VAC;  Surgeon: Fransisco Hertz, MD;  Location: [facility] OR;  Service: Vascular;  Laterality: Right;  . Insertion of dialysis catheter 10/03/2011    Procedure: INSERTION OF DIALYSIS CATHETER;  Surgeon: Fransisco Hertz, MD;  Location: [facility] OR;  Service: Vascular;  Laterality: Left;  Insertion left femoral tunneled dialysis catheter  . Removal of a dialysis catheter 01/16/2012    Procedure: REMOVAL OF A DIALYSIS CATHETER;  Surgeon: Fransisco Hertz, MD;  Location: [facility] OR;  Service: Vascular;  Laterality: Left;  . Insertion  of dialysis catheter 01/16/2012    Procedure: INSERTION OF DIALYSIS CATHETER;  Surgeon: Fransisco Hertz, MD;  Location: [facility] OR;  Service: Vascular;  Laterality: Left;   Social History:  reports that she quit smoking about 12 years ago. Her smoking use included Cigarettes. She has a 7.5 pack-year smoking history. She has never used smokeless tobacco. She reports that she does not drink alcohol or use illicit drugs.   Allergies  Allergen Reactions  . Aspirin     Unknown    . Heparin     SHE is HIT neg; platelets have been normal on low dose heparin at out pt dialysis ; not clear why this is listed   . Minoxidil     Unknown    . Penicillins     Unknown      Family History  Problem Relation Age of Onset  . Diabetes Mother   . Hypertension Father   . Anesthesia problems Neg Hx   . Hypotension Neg Hx   . Malignant hyperthermia Neg Hx   . Pseudochol deficiency Neg Hx     Prior to Admission medications   Medication Sig Start Date End Date Taking? Authorizing Provider  acetaminophen (TYLENOL) 325 MG tablet Take 325-650 mg by mouth every 4 (four) hours as needed. For headache.   Yes Historical Provider, MD  amLODipine (NORVASC) 5 MG tablet Take 5 mg by mouth at bedtime. 03/17/11  Yes Srikar Cherlynn Kaiser, MD  calcium acetate (PHOSLO) 667 MG capsule Take 667 mg by mouth 3 (three) times daily with meals.  09/01/10  Yes Historical Provider, MD  darbepoetin (ARANESP) 200 MCG/0.4ML SOLN Inject 200 mcg into the vein every Thursday with hemodialysis. Thursday with dialysis   Yes Historical Provider, MD  docusate sodium (COLACE) 100 MG capsule Take 100 mg by mouth 2 (two) times daily.    Yes Historical Provider, MD  DULoxetine (CYMBALTA) 20 MG capsule Take 20 mg by mouth daily before breakfast.    Yes Historical Provider, MD  insulin aspart (NOVOLOG) 100 UNIT/ML injection Inject 5 Units into the skin 2 (two) times daily before lunch and supper.    Yes Historical Provider, MD  insulin glargine (LANTUS) 100  UNIT/ML injection Inject 5 Units into the skin at bedtime.   Yes Historical Provider, MD  lidocaine-prilocaine (EMLA) cream Apply 1 application topically Every Tuesday,Thursday,and Saturday with dialysis.   Yes Historical Provider, MD  Menthol-Zinc Oxide (REMEDY CALAZIME EX) Apply 1 application topically daily. To sacrum   Yes Historical Provider, MD  metoprolol tartrate (LOPRESSOR) 25 MG tablet Take 25 mg by mouth See admin instructions. Twice a day on Sunday, Monday, Wednesday, Friday; once daily after dialysis on Tuesday, Thursday, Saturday   Yes Historical Provider, MD  multivitamin (RENA-VIT) TABS tablet Take 1 tablet by mouth daily.   Yes Historical Provider, MD  Nutritional Supplements (FEEDING SUPPLEMENT, NEPRO CARB STEADY,) LIQD Take 237 mLs  by mouth 2 (two) times daily with a meal.    Yes Historical Provider, MD  omeprazole (PRILOSEC) 20 MG capsule Take 40 mg by mouth daily.    Yes Historical Provider, MD  oxyCODONE-acetaminophen (PERCOCET/ROXICET) 5-325 MG per tablet Take 1-2 tablets by mouth every 4 (four) hours as needed. For pain.  Give 1 tablet for pain scale 1-5 and give 2 tablets for pain scale 6-10.   Yes Historical Provider, MD  paricalcitol (ZEMPLAR) 2 MCG/ML injection Inject 1 mcg into the vein 3 (three) times a week. Inject during dialysis.   Yes Historical Provider, MD  simvastatin (ZOCOR) 20 MG tablet Take 20 mg by mouth at bedtime.    Yes Historical Provider, MD  warfarin (COUMADIN) 5 MG tablet Take 5 mg by mouth daily.   Yes Historical Provider, MD   Physical Exam: Filed Vitals:   03/28/12 1437 03/28/12 1515 03/28/12 1545 03/28/12 1615  BP: 114/34 116/41 117/36 113/90  Pulse: 66 70 77 78  Temp: 99.5 F (37.5 C)     Resp: 18 29 21 25   SpO2: 91% 90% 91% 92%     General:  She is in no acute distress however emotional at times  Eyes: Extraocular movements intact no scleral icterus  Neck: No JVD  Cardiovascular: Regular with extra beats versus  irregular  Respiratory: Clear to auscultation bilaterally  Abdomen: Soft nontender to palpation  Skin: No rashes  Musculoskeletal: No peripheral edema  Neurologic: Cranial nerves grossly intact. Left upper and left lower extremity weakness.  Labs on Admission:  Basic Metabolic Panel:  Lab 03/28/12 1610  NA 134*  K 3.5  CL 92*  CO2 31  GLUCOSE 230*  BUN 15  CREATININE 3.98*  CALCIUM 9.2  MG --  PHOS --   CBC:  Lab 03/28/12 1356  WBC 7.2  NEUTROABS 5.3  HGB 7.8*  HCT 25.2*  MCV 88.4  PLT 235   Radiological Exams on Admission: No results found.  Assessment/Plan Active Problems:  GI bleed  Supratherapeutic INR  ESRD (end stage renal disease) on dialysis  CVA, old, hemiparesis   1. GI bleed - she dropped about 5 units since last hemoglobin was checked. We'll admit her to the step down unit. We'll obtain to IV accesses 18-gauge. She is type and hold, as received one unit of FFP per ED physicians as well as vitamin K. We'll give her one unit of packed red blood cells. We cannot completely exclude a  brisk upper GI bleed at this point, therefore we will start IV Protonix. It is possible that this represents  lower GI - diverticular bleed. Gastroenterology team was consulted (pager [phone]). This has been going on for about 2 days, but if she starts bleeding more and more briskly with a significant drop in hemoglobin , we'll consider consulting IR or do a tagged RBC scan. 2. Atrial fibrillation - we will stop her Coumadin C. she has a GI bleed. 3. End-stage renal disease on hemodialysis - renal team has been notified that she is being admitted (Dr. Allena Katz paged). 4.  DIABETES mellitus- last hemoglobin A1c is 7.3. Recheck CBGs every 6 hours, and start sliding scale insulin.  5.  prophylaxis - heparin subcutaneous held secondary to GI bleed    Code Status: full (must indicate code status--if unknown or must be presumed, indicate so) Family Communication: none (indicate  person spoken with, if applicable, with phone number if by telephone) Disposition Plan: unknown (indicate anticipated LOS)  Time spent: 70 minutes  Pamella Pert [facility] Pager [phone]  If 7PM-7AM, please contact night-coverage [email or web address] Password [facility] 03/28/2012, 4:19 PM

## 2012-03-28 NOTE — ED Notes (Signed)
Pt here from Nursing home with c/o low hgb. Pt has lab work  With a hgb of 6.9 Ems reports poss. GI bleed

## 2012-03-28 NOTE — ED Provider Notes (Signed)
History     CSN: [id number]  Arrival date & time 03/28/12  1335   First MD Initiated Contact with Patient 03/28/12 1336      Chief Complaint  Patient presents with  . Rectal Bleeding    (Consider location/radiation/quality/duration/timing/severity/associated sxs/prior treatment) HPI  79 year old female with history of end-stage renal disease on T-Th-Sat dialysis, CAD, atrial fib currently on Coumadin, and history of anemia presents for evaluation of low hemoglobin.  Pt was sent here from nursing home as her regular blood work shows Hgb 6.6 and hemoccult positive.  Pt reports she has been having lightheadedness for over a week.  She feels tired, and her butt is tender to lay on it.  She acknowledge that she has hx of anemia but unsure what caused it.  She denies taking any NSAIDs, denies fever, chills, cp, sob, cough, hemoptysis, hematemesis, or melena.  No c/o cp, sob, abd pain.  Has hx stroke with residual L sided weakness.  Denies headache or vision changes.  Her coumadin has been on hold since 03/25/12.    Past Medical History  Diagnosis Date  . Atrial fibrillation   . CAD (coronary artery disease)   . Hypertension   . Anemia   . MRSA bacteremia     hx  . Cellulitis   . GERD (gastroesophageal reflux disease)   . Acetabulum fracture   . Contracture of elbow     L arm  . Dementia   . Diabetes mellitus     on Insulin  . Decubitus ulcer, buttock 02/07/2011    Pt presents with dressing to R buttocks/coccyx.  . Pneumonia   . Blood transfusion   . Stroke ~ 1990's    Left side paralysis.  Left  foot drop., L arm contracted.  . Chronic kidney disease 03/13/11    dialysis at [facility]; TTS,last tx 03/12/11 hemo x 20years  . Depression   . Hyperlipidemia     Past Surgical History  Procedure Date  . Arteriovenous graft placement   . Appendectomy   . Cholecystectomy   . Tubal ligation     bilateral  . Abdominal hysterectomy   . Av fistula placement 02/07/2011   Procedure: INSERTION OF ARTERIOVENOUS (AV) GORE-TEX GRAFT THIGH;  Surgeon: Pryor Ochoa, MD;  Location: [facility] OR;  Service: Vascular;  Laterality: Right;  insertion AVGG right thigh  . Av fistula placement 02/12/2011    Procedure: INSERTION OF ARTERIOVENOUS (AV) GORE-TEX GRAFT THIGH;  Surgeon: Nilda Simmer, MD;  Location: [facility] OR;  Service: Vascular;  Laterality: Right;  . Cataract extraction w/ intraocular lens  implant, bilateral   . Avgg removal 05/24/2011    Procedure: REMOVAL OF ARTERIOVENOUS GORETEX GRAFT (AVGG);  Surgeon: Fransisco Hertz, MD;  Location: [facility] OR;  Service: Vascular;  Laterality: Right;  with bovine patch angioplasty  . Application of wound vac 05/24/2011    Procedure: APPLICATION OF WOUND VAC;  Surgeon: Fransisco Hertz, MD;  Location: [facility] OR;  Service: Vascular;  Laterality: Right;  . Insertion of dialysis catheter 10/03/2011    Procedure: INSERTION OF DIALYSIS CATHETER;  Surgeon: Fransisco Hertz, MD;  Location: [facility] OR;  Service: Vascular;  Laterality: Left;  Insertion left femoral tunneled dialysis catheter  . Removal of a dialysis catheter 01/16/2012    Procedure: REMOVAL OF A DIALYSIS CATHETER;  Surgeon: Fransisco Hertz, MD;  Location: [facility] OR;  Service: Vascular;  Laterality: Left;  . Insertion of dialysis catheter 01/16/2012    Procedure: INSERTION OF  DIALYSIS CATHETER;  Surgeon: Fransisco Hertz, MD;  Location: [facility] OR;  Service: Vascular;  Laterality: Left;    Family History  Problem Relation Age of Onset  . Diabetes Mother   . Hypertension Father   . Anesthesia problems Neg Hx   . Hypotension Neg Hx   . Malignant hyperthermia Neg Hx   . Pseudochol deficiency Neg Hx     History  Substance Use Topics  . Smoking status: Former Smoker -- 0.2 packs/day for 30 years    Types: Cigarettes    Quit date: 03/12/2000  . Smokeless tobacco: Never Used  . Alcohol Use: No     Comment: "last alcohol ~ 1990"    OB History    Grav Para Term Preterm Abortions TAB SAB Ect Mult Living                   Review of Systems  Constitutional:       10 Systems reviewed and all are negative for acute change except as noted in the HPI.     Allergies  Aspirin; Heparin; Minoxidil; and Penicillins  Home Medications   Current Outpatient Rx  Name  Route  Sig  Dispense  Refill  . ACETAMINOPHEN 325 MG PO TABS   Oral   Take 325-650 mg by mouth every 4 (four) hours as needed. For headache.         . AMLODIPINE BESYLATE 5 MG PO TABS   Oral   Take 5 mg by mouth at bedtime.         Marland Kitchen NEPHRO-VITE 0.8 MG PO TABS   Oral   Take 0.8 mg by mouth every morning.         Marland Kitchen BISACODYL 5 MG PO TBEC   Oral   Take 10 mg by mouth daily as needed. For constipation.         Marland Kitchen CALCIUM ACETATE 667 MG PO CAPS   Oral   Take 667 mg by mouth 3 (three) times daily with meals.          Marland Kitchen CALCIUM CARBONATE 1250 MG/5ML PO SUSP   Oral   Take 500 mg of elemental calcium by mouth every 6 (six) hours as needed. For acid reflux.         Marland Kitchen DOCUSATE SODIUM 100 MG PO CAPS   Oral   Take 100 mg by mouth 2 (two) times daily.          . DULOXETINE HCL 20 MG PO CPEP   Oral   Take 20 mg by mouth daily.         Marland Kitchen PRO-STAT 64 PO LIQD   Oral   Take 30 mLs by mouth 3 (three) times daily with meals.         . GUAIFENESIN-DM 100-10 MG/5ML PO SYRP   Oral   Take 10 mLs by mouth every 6 (six) hours as needed. For cough         . INSULIN ASPART 100 UNIT/ML [place] SOLN   Subcutaneous   Inject 5 Units into the skin 2 (two) times daily before lunch and supper.          . INSULIN GLARGINE 100 UNIT/ML [place] SOLN   Subcutaneous   Inject 5 Units into the skin at bedtime.         Marland Kitchen LIDOCAINE-PRILOCAINE 2.5-2.5 % EX CREA   Topical   Apply 1 application topically Every Tuesday,Thursday,and Saturday with dialysis.         Marland Kitchen  MECLIZINE HCL 12.5 MG PO TABS   Oral   Take 12.5 mg by mouth 3 (three) times daily as needed. For dizziness         . METOPROLOL TARTRATE 25 MG PO TABS   Oral   Take 25 mg by  mouth See admin instructions. Takes 25 mg once a day and 25 mg twice a day  - on alternating days         . NEPRO/CARB STEADY PO LIQD   Oral   Take 237 mLs by mouth 3 (three) times daily with meals.         . OMEPRAZOLE 20 MG PO CPDR   Oral   Take 20 mg by mouth daily.          . OXYCODONE HCL 5 MG PO CAPS   Oral   Take 5 mg by mouth every 4 (four) hours as needed. For pain         . OXYCODONE-ACETAMINOPHEN 5-325 MG PO TABS   Oral   Take 1-2 tablets by mouth every 4 (four) hours as needed. For pain.  Give 1 tablet for pain scale 1-5 and give 2 tablets for pain scale 6-10.         Marland Kitchen PARICALCITOL 2 MCG/ML IV SOLN   Intravenous   Inject 1 mcg into the vein 3 (three) times a week. Inject during dialysis.         Marland Kitchen SIMVASTATIN 20 MG PO TABS   Oral   Take 20 mg by mouth at bedtime.          . WARFARIN SODIUM 6 MG PO TABS   Oral   Take 6 mg by mouth every evening.           There were no vitals taken for this visit.  Physical Exam  Nursing note and vitals reviewed. Constitutional: She is oriented to person, place, and time. No distress.       Chronically Ill-appearing female  HENT:  Head: Normocephalic and atraumatic.  Mouth/Throat: Oropharynx is clear and moist.  Eyes: Conjunctivae normal are normal.  Neck: Neck supple.  Cardiovascular: Normal rate and regular rhythm.   Pulmonary/Chest: Effort normal and breath sounds normal.  Abdominal: Soft. Bowel sounds are normal. There is no tenderness. There is no rebound and no guarding.  Genitourinary:       Chaperone present:  Well healing stage II decubitus sacral ulcer, mildly tender on palpation.    Normal rectal tone, Hard stool in rectal vault, no mass, bright red blood mixed with stool.  Hemoccult positive  L femoral access for dialysis.    Musculoskeletal: She exhibits no edema.       L arm contracture, L foot drop.    Neurological: She is alert and oriented to person, place, and time.  Skin: Skin is  warm. No rash noted.  Psychiatric: She has a normal mood and affect.    ED Course  Procedures (including critical care time)   Date: 03/28/2012  Rate: 71  Rhythm: normal sinus rhythm  QRS Axis: normal  Intervals: PR prolonged  ST/T Wave abnormalities: nonspecific ST/T changes  Conduction Disutrbances:first-degree A-V block   Narrative Interpretation:   Old EKG Reviewed: unchanged    Results for orders placed during the hospital encounter of 03/28/12  CBC WITH DIFFERENTIAL      Component Value Range   WBC 7.2  4.0 - 10.5 K/uL   RBC 2.85 (*) 3.87 - 5.11 MIL/uL   Hemoglobin 7.8 (*)  12.0 - 15.0 g/dL   HCT 16.1 (*) 09.6 - 04.5 %   MCV 88.4  78.0 - 100.0 fL   MCH 27.4  26.0 - 34.0 pg   MCHC 31.0  30.0 - 36.0 g/dL   RDW 40.9 (*) 81.1 - 91.4 %   Platelets 235  150 - 400 K/uL   Neutrophils Relative 74  43 - 77 %   Neutro Abs 5.3  1.7 - 7.7 K/uL   Lymphocytes Relative 16  12 - 46 %   Lymphs Abs 1.2  0.7 - 4.0 K/uL   Monocytes Relative 8  3 - 12 %   Monocytes Absolute 0.6  0.1 - 1.0 K/uL   Eosinophils Relative 1  0 - 5 %   Eosinophils Absolute 0.1  0.0 - 0.7 K/uL   Basophils Relative 0  0 - 1 %   Basophils Absolute 0.0  0.0 - 0.1 K/uL  BASIC METABOLIC PANEL      Component Value Range   Sodium 134 (*) 135 - 145 mEq/L   Potassium 3.5  3.5 - 5.1 mEq/L   Chloride 92 (*) 96 - 112 mEq/L   CO2 31  19 - 32 mEq/L   Glucose, Bld 230 (*) 70 - 99 mg/dL   BUN 15  6 - 23 mg/dL   Creatinine, Ser 7.82 (*) 0.50 - 1.10 mg/dL   Calcium 9.2  8.4 - 95.6 mg/dL   GFR calc non Af Amer 10 (*) >90 mL/min   GFR calc Af Amer 11 (*) >90 mL/min  TYPE AND SCREEN      Component Value Range   ABO/RH(D) O POS     Antibody Screen NEG     Sample Expiration 03/31/2012    PROTIME-INR      Component Value Range   Prothrombin Time 29.6 (*) 11.6 - 15.2 seconds   INR 3.01 (*) 0.00 - 1.49  OCCULT BLOOD, POC DEVICE      Component Value Range   Fecal Occult Bld POSITIVE (*) NEGATIVE  POCT I-STAT TROPONIN I        Component Value Range   Troponin i, poc 0.02  0.00 - 0.08 ng/mL   Comment 3            No results found.  1. Anemia 2. GI bleed, lower 3. supratherapeutic INR 4. Dialysis patient  MDM  79 y/o AAF presents with GI bleed and Hgb 6.9.  Currently on Coumadin, which has been on hold since 1/14.    3:35 PM Pt is A&O x 3, in NAD.  VSS, sats at 99% on RA.  Hgb is 7.8 here in ER.  Pt is supretherapeutic INR of 3.01.  Care discuss with attending.  Will give Vitamin K and FFP.  I have consulted with [facility] who request to have 2 IV access line and to admit pt to Team 6.  Hospitalist will see pt in ER and will provide specific holding order.  Pt currently stable.    CRITICAL CARE Performed by: Fayrene Helper   Total critical care time: 40 min  Critical care time was exclusive of separately billable procedures and treating other patients.  Critical care was necessary to treat or prevent imminent or life-threatening deterioration.  Critical care was time spent personally by me on the following activities: development of treatment plan with patient and/or surrogate as well as nursing, discussions with consultants, evaluation of patient's response to treatment, examination of patient, obtaining history from patient or surrogate, ordering and  performing treatments and interventions, ordering and review of laboratory studies, ordering and review of radiographic studies, pulse oximetry and re-evaluation of patient's condition.       Fayrene Helper, PA-C 03/28/12 1558

## 2012-03-29 DIAGNOSIS — N186 End stage renal disease: Secondary | ICD-10-CM

## 2012-03-29 DIAGNOSIS — I4891 Unspecified atrial fibrillation: Secondary | ICD-10-CM

## 2012-03-29 LAB — CBC
HCT: 23.6 % — ABNORMAL LOW (ref 36.0–46.0)
HCT: 24.4 % — ABNORMAL LOW (ref 36.0–46.0)
HCT: 27.6 % — ABNORMAL LOW (ref 36.0–46.0)
HCT: 28.2 % — ABNORMAL LOW (ref 36.0–46.0)
Hemoglobin: 7.8 g/dL — ABNORMAL LOW (ref 12.0–15.0)
Hemoglobin: 8.8 g/dL — ABNORMAL LOW (ref 12.0–15.0)
Hemoglobin: 8.8 g/dL — ABNORMAL LOW (ref 12.0–15.0)
MCH: 27.4 pg (ref 26.0–34.0)
MCH: 27.8 pg (ref 26.0–34.0)
MCH: 28.7 pg (ref 26.0–34.0)
MCHC: 31.9 g/dL (ref 30.0–36.0)
MCHC: 32.8 g/dL (ref 30.0–36.0)
MCV: 86.8 fL (ref 78.0–100.0)
MCV: 87.3 fL (ref 78.0–100.0)
Platelets: 191 10*3/uL (ref 150–400)
Platelets: 199 10*3/uL (ref 150–400)
Platelets: 215 10*3/uL (ref 150–400)
RBC: 2.72 MIL/uL — ABNORMAL LOW (ref 3.87–5.11)
RBC: 2.82 MIL/uL — ABNORMAL LOW (ref 3.87–5.11)
RBC: 3.21 MIL/uL — ABNORMAL LOW (ref 3.87–5.11)
RDW: 16.5 % — ABNORMAL HIGH (ref 11.5–15.5)
RDW: 16.3 % — ABNORMAL HIGH (ref 11.5–15.5)
RDW: 16.2 % — ABNORMAL HIGH (ref 11.5–15.5)
WBC: 7.3 10*3/uL (ref 4.0–10.5)
WBC: 9 10*3/uL (ref 4.0–10.5)

## 2012-03-29 LAB — MRSA PCR SCREENING: MRSA by PCR: NEGATIVE

## 2012-03-29 LAB — GLUCOSE, CAPILLARY
Glucose-Capillary: 158 mg/dL — ABNORMAL HIGH (ref 70–99)
Glucose-Capillary: 138 mg/dL — ABNORMAL HIGH (ref 70–99)

## 2012-03-29 LAB — PROTIME-INR: Prothrombin Time: 17.8 seconds — ABNORMAL HIGH (ref 11.6–15.2)

## 2012-03-29 LAB — PREPARE RBC (CROSSMATCH)

## 2012-03-29 MED ORDER — DOCUSATE SODIUM 100 MG PO CAPS
100.0000 mg | ORAL_CAPSULE | Freq: Two times a day (BID) | ORAL | Status: DC
  Administered 2012-03-29 – 2012-03-31 (×4): 100 mg via ORAL
  Filled 2012-03-29 (×5): qty 1

## 2012-03-29 MED ORDER — DARBEPOETIN ALFA-POLYSORBATE 200 MCG/0.4ML IJ SOLN
200.0000 ug | INTRAMUSCULAR | Status: DC
  Administered 2012-03-29: 200 ug via INTRAVENOUS

## 2012-03-29 MED ORDER — METOPROLOL TARTRATE 25 MG PO TABS: 25.0000 mg | ORAL_TABLET | ORAL | Status: DC

## 2012-03-29 MED ORDER — AMLODIPINE BESYLATE 5 MG PO TABS
5.0000 mg | ORAL_TABLET | Freq: Every day | ORAL | Status: DC
  Filled 2012-03-29: qty 1

## 2012-03-29 MED ORDER — PARICALCITOL 5 MCG/ML IV SOLN
4.0000 ug | INTRAVENOUS | Status: DC
  Administered 2012-03-29: 4 ug via INTRAVENOUS

## 2012-03-29 MED ORDER — NEPRO/CARBSTEADY PO LIQD
237.0000 mL | Freq: Two times a day (BID) | ORAL | Status: DC
  Administered 2012-03-30 – 2012-03-31 (×2): 237 mL via ORAL
  Filled 2012-03-29 (×5): qty 237

## 2012-03-29 MED ORDER — RENA-VITE PO TABS
1.0000 | ORAL_TABLET | Freq: Every day | ORAL | Status: DC
  Administered 2012-03-29 – 2012-03-30 (×2): 1 via ORAL
  Filled 2012-03-29 (×3): qty 1

## 2012-03-29 MED ORDER — DARBEPOETIN ALFA-POLYSORBATE 200 MCG/0.4ML IJ SOLN
INTRAMUSCULAR | Status: AC
  Administered 2012-03-29: 200 ug via INTRAVENOUS
  Filled 2012-03-29: qty 0.4

## 2012-03-29 MED ORDER — PARICALCITOL 2 MCG/ML IV SOLN: 1.0000 ug | INTRAVENOUS | Status: DC

## 2012-03-29 MED ORDER — SIMVASTATIN 20 MG PO TABS
20.0000 mg | ORAL_TABLET | Freq: Every day | ORAL | Status: DC
  Administered 2012-03-29 – 2012-03-30 (×2): 20 mg via ORAL
  Filled 2012-03-29 (×3): qty 1

## 2012-03-29 MED ORDER — PARICALCITOL 5 MCG/ML IV SOLN
INTRAVENOUS | Status: AC
  Filled 2012-03-29: qty 1

## 2012-03-29 MED ORDER — CALCIUM ACETATE 667 MG PO CAPS
667.0000 mg | ORAL_CAPSULE | Freq: Three times a day (TID) | ORAL | Status: DC
  Administered 2012-03-30 – 2012-03-31 (×5): 667 mg via ORAL
  Filled 2012-03-29 (×8): qty 1

## 2012-03-29 MED ORDER — INSULIN ASPART 100 UNIT/ML ~~LOC~~ SOLN
0.0000 [IU] | Freq: Three times a day (TID) | SUBCUTANEOUS | Status: DC
  Administered 2012-03-30 – 2012-03-31 (×3): 2 [IU] via SUBCUTANEOUS
  Administered 2012-03-31: 1 [IU] via SUBCUTANEOUS

## 2012-03-29 MED ORDER — PANTOPRAZOLE SODIUM 40 MG PO TBEC: 40.0000 mg | DELAYED_RELEASE_TABLET | Freq: Two times a day (BID) | ORAL | Status: DC

## 2012-03-29 MED ORDER — INSULIN ASPART 100 UNIT/ML ~~LOC~~ SOLN: 0.0000 [IU] | Freq: Every day | SUBCUTANEOUS | Status: DC

## 2012-03-29 MED ORDER — PIPERACILLIN-TAZOBACTAM IN DEX 2-0.25 GM/50ML IV SOLN
2.2500 g | Freq: Three times a day (TID) | INTRAVENOUS | Status: DC
  Administered 2012-03-30 – 2012-03-31 (×4): 2.25 g via INTRAVENOUS
  Filled 2012-03-29 (×8): qty 50

## 2012-03-29 MED ORDER — PANTOPRAZOLE SODIUM 40 MG PO TBEC
40.0000 mg | DELAYED_RELEASE_TABLET | Freq: Two times a day (BID) | ORAL | Status: DC
  Administered 2012-03-29 – 2012-03-31 (×4): 40 mg via ORAL
  Filled 2012-03-29 (×4): qty 1

## 2012-03-29 MED ORDER — METOPROLOL TARTRATE 25 MG PO TABS
25.0000 mg | ORAL_TABLET | ORAL | Status: DC
  Administered 2012-03-29 – 2012-03-30 (×2): 25 mg via ORAL
  Filled 2012-03-29: qty 1

## 2012-03-29 MED ORDER — DULOXETINE HCL 20 MG PO CPEP
20.0000 mg | ORAL_CAPSULE | Freq: Every day | ORAL | Status: DC
  Administered 2012-03-30 – 2012-03-31 (×2): 20 mg via ORAL
  Filled 2012-03-29 (×3): qty 1

## 2012-03-29 MED ORDER — METOPROLOL TARTRATE 25 MG PO TABS
25.0000 mg | ORAL_TABLET | ORAL | Status: DC
  Administered 2012-03-30 – 2012-03-31 (×3): 25 mg via ORAL
  Filled 2012-03-29 (×4): qty 1

## 2012-03-29 NOTE — Progress Notes (Addendum)
[facility] PROGRESS NOTE  Ebony [name] NWG:[id number] DOB: Nov 27, 1929 DOA: 03/28/2012 PCP: Johny Sax, MD  Assessment/Plan: GI bleed Etiology unclear.  Patient transfuse 1 unit of PRBC.  Hemoglobin this afternoon was 7.8.  Transfuse another unit of PRBC.  Patient's last hemoglobin on 01/16/2012 was 12.2.  Patient given 5 mg of oral vitamin K.  Patient transfused 2 units of PRBC.  GI bleed presumed to be diverticular in origin, GI not planning on intervention unless bleeding recurs.  Bacteremia Blood cultures 2/2 growing gram-negative rods on 03/29/2012.  Start Zosyn per pharmacy.  Acute blood loss anemia Transfused 1 units of PRBC on 03/29/2011.  Transfuse another unit of PRBC today.  Supratherapeutic INR Corrected with FFP and vitamin K.  Atrial fibrillation Rate controlled.  Not on anticoagulation given concern for GI bleed.  End-stage renal disease Hemodialysis as per renal service.  Diabetes Sliding scale insulin.  Hyperlipidemia Statin.  Hypertension Stable.  Depression Continue Cymbalta.  Diabetes  SSI.   Prophylaxis  SCDs, not on heparin due to GI bleed.   Code Status: Full code Family Communication: No family at bedside Disposition Plan: Pending  Consultants:  GI, Dr. Arlyce Dice  Procedures:  None  Antibiotics:  Zosyn 03/29/2012 >>  HPI/Subjective: No specific concerns.  Denies any pain.  Objective: Filed Vitals:   03/29/12 0515 03/29/12 0530 03/29/12 0654 03/29/12 0744  BP: 102/39 104/40  106/31  Pulse: 64 61  61  Temp:    98.9 F (37.2 C)  TempSrc:    Oral  Resp: 26 26  15   Height:      Weight:   74 kg (163 lb 2.3 oz)   SpO2:    97%    Intake/Output Summary (Last 24 hours) at 03/29/12 1130 Last data filed at 03/29/12 0900  Gross per 24 hour  Intake 2111.58 ml  Output      0 ml  Net 2111.58 ml   Filed Weights   03/29/12 0654  Weight: 74 kg (163 lb 2.3 oz)    Exam: Physical Exam: General: Awake, Oriented, No  acute distress. HEENT: EOMI. Neck: Supple CV: S1 and S2 Lungs: Clear to ascultation bilaterally Abdomen: Soft, Nontender, Nondistended, +bowel sounds. Ext: Good pulses. Trace edema.  Data Reviewed: Basic Metabolic Panel:  Lab 03/28/12 5784  NA 134*  K 3.5  CL 92*  CO2 31  GLUCOSE 230*  BUN 15  CREATININE 3.98*  CALCIUM 9.2  MG --  PHOS --   Liver Function Tests: No results found for this basename: AST:5,ALT:5,ALKPHOS:5,BILITOT:5,PROT:5,ALBUMIN:5 in the last 168 hours No results found for this basename: LIPASE:5,AMYLASE:5 in the last 168 hours No results found for this basename: AMMONIA:5 in the last 168 hours CBC:  Lab 03/29/12 0728 03/29/12 0001 03/28/12 2158 03/28/12 1356  WBC 6.76.6 9.0 8.3 7.2  NEUTROABS -- -- -- 5.3  HGB 8.8*8.8* 7.8* 8.8* 7.8*  HCT 27.6*28.2* 23.6* 26.4* 25.2*  MCV 87.387.9 86.8 87.1 88.4  PLT 200198 215 215 235   Cardiac Enzymes: No results found for this basename: CKTOTAL:5,CKMB:5,CKMBINDEX:5,TROPONINI:5 in the last 168 hours BNP (last 3 results) No results found for this basename: PROBNP:3 in the last 8760 hours CBG:  Lab 03/29/12 0609 03/29/12 0009  GLUCAP 158* 213*    Recent Results (from the past 240 hour(s))  MRSA PCR SCREENING     Status: Normal   Collection Time   03/29/12 12:01 AM      Component Value Range Status Comment   MRSA by PCR NEGATIVE  NEGATIVE  Final      Studies: Dg Chest Port 1 View  03/29/2012  *RADIOLOGY REPORT*  Clinical Data: 79 year old female with fever, shortness of breath and cough.  PORTABLE CHEST - 1 VIEW  Comparison: 01/16/2012 and prior chest radiographs  Findings: Upper limits normal heart size again noted. Left basilar scarring is present. There is no evidence of focal airspace disease, pulmonary edema, suspicious pulmonary nodule/mass, pleural effusion, or pneumothorax. No acute bony abnormalities are identified.  IMPRESSION: No evidence of acute cardiopulmonary disease.   Original Report  Authenticated By: Harmon Pier, M.D.     Scheduled Meds:  Continuous Infusions:   . pantoprozole (PROTONIX) infusion 8 mg/hr (03/28/12 2049)    Active Problems:  GI bleed  Supratherapeutic INR  ESRD (end stage renal disease) on dialysis  CVA, old, hemiparesis  Acute posthemorrhagic anemia   Ebony [name]  [facility] Pager [phone]. If 7PM-7AM, please contact night-coverage at [email or web address], password [facility] 03/29/2012, 11:30 AM  LOS: 1 day

## 2012-03-29 NOTE — Progress Notes (Signed)
Patient ID: Ebony [name], female   DOB: Jun 25, 1929, 79 y.o.   MRN: [id number] [facility] Progress Note  Subjective: Resting quietly. No c/o abdominal pain. No Bm today that she is aware of.   Objective:  Vital signs in last 24 hours: Temp:  [97.2 F (36.2 C)-102 F (38.9 C)] 100.3 F (37.9 C) (01/18 1141) Pulse Rate:  [60-84] 66  (01/18 1141) Resp:  [12-33] 12  (01/18 1141) BP: (93-146)/(28-90) 115/44 mmHg (01/18 1141) SpO2:  [90 %-100 %] 100 % (01/18 1141) Weight:  [163 lb 2.3 oz (74 kg)] 163 lb 2.3 oz (74 kg) (01/18 0654) Last BM Date: 03/27/12 General:   Alert,  Well-developed,    in NAD Heart: ir Regular rate and rhythm;systolic murmur Pulm;clear Abdomen:  Soft, nontender and nondistended. Normal bowel sounds, without guarding, and without rebound.   Extremities:  Without edema. Neurologic:  Alert and  oriented x4;  grossly normal neurologically. Psych:  Alert and cooperative. Normal mood and affect.  Intake/Output from previous day: 01/17 0701 - 01/18 0700 In: 1707 [P.O.:120; I.V.:500; Blood:1087] Out: -  Intake/Output this shift: Total I/O In: 713.3 [P.O.:240; I.V.:373.3; IV Piggyback:100] Out: -   Lab Results:  Basename 03/29/12 1215 03/29/12 0728 03/29/12 0001  WBC 7.3 6.76.6 9.0  HGB 7.8* 8.8*8.8* 7.8*  HCT 24.4* 27.6*28.2* 23.6*  PLT 191 200198 215   BMET  Basename 03/28/12 1356  NA 134*  K 3.5  CL 92*  CO2 31  GLUCOSE 230*  BUN 15  CREATININE 3.98*  CALCIUM 9.2   LFT No results found for this basename: PROT,ALBUMIN,AST,ALT,ALKPHOS,BILITOT,BILIDIR,IBILI in the last 72 hours PT/INR  Basename 03/29/12 0728 03/28/12 1356  LABPROT 17.8* 29.6*  INR 1.51* 3.01*     Assessment / Plan: #1  79 yo female with ESRD, and cardiomyopathy chronically anticoagulated with acute lower GI bleed in setting of supratherapeutic INR. Stable- no ongoing active bleeding today INR corrected. Suspect Diverticular etiology. Continue serial  Hgbs  , full liquid diet No endoscopic interventions unless rebleeds with normal INR  #2 Chronic anemia- baseline hgb9-10 range Active Problems:  GI bleed  Supratherapeutic INR  ESRD (end stage renal disease) on dialysis  CVA, old, hemiparesis  Acute posthemorrhagic anemia     LOS: 1 day   Amy Esterwood  03/29/2012, 1:01 PM  I have personally taken an interval history, reviewed the chart, and examined the patient.  I agree with the extender's note, impression and recommendations.  Barbette Hair. Arlyce Dice, MD, [facility] [phone]

## 2012-03-29 NOTE — Progress Notes (Signed)
Pt came from the ED yesterday to 2613 around 1700 per day shift nurse, with blood transfusing.  Order to transfuse blood was not seen under order management, PA on call was paged, but the order to transfuse was later found under order history. Pt did not also have a blood consent form in the chart, night rn called down to the ED wanting to talk with the Ed nurse who took care of the pt concerning the blood consent form, just in case it was obtained but was left in the ED,  but was told that nurse had already gone home. In any case, pt was made to sign a blood consent form on the floor. Pt temp at the end of the post transfusion was 102.0, an hour before, it was 99.3. but pt did not have any other blood transfusion reaction complaints.  PA on call was notified, tylenol was ordered, blood bank was notified, blood transfusion reaction protocol was followed. However, urine could not be obtained by an In and Out cath because pt is anuric (dialysis pt). After the tylenol was given, pt's temp has been trending down. Blood bank called back later and stated that it was ok for pt to get the ordered plasma. Pt is getting ordered FFP. Will continue to monitor pt.------Laurie Penado, rn

## 2012-03-29 NOTE — Progress Notes (Signed)
ANTIBIOTIC CONSULT NOTE - INITIAL  Pharmacy Consult for Zosyn  Indication: Bacteremia  Allergies  Allergen Reactions  . Aspirin     Unknown    . Heparin     SHE is HIT neg; platelets have been normal on low dose heparin at out pt dialysis ; not clear why this is listed   . Minoxidil     Unknown    . Penicillins     Unknown      Patient Measurements: Height: 5' (152.4 cm) Weight: 157 lb 6.5 oz (71.4 kg) IBW/kg (Calculated) : 45.5    Vital Signs: Temp: 98.1 F (36.7 C) (01/18 1533) Temp src: Oral (01/18 1533) BP: 113/47 mmHg (01/18 1700) Pulse Rate: 73  (01/18 1700) Intake/Output from previous day: 01/17 0701 - 01/18 0700 In: 1707 [P.O.:120; I.V.:500; Blood:1087] Out: -  Intake/Output from this shift: Total I/O In: 769.6 [P.O.:240; I.V.:429.6; IV Piggyback:100] Out: 0   Labs:  [facility] 03/29/12 1215 03/29/12 0728 03/29/12 0001 03/28/12 1356  WBC 7.3 6.76.6 9.0 --  HGB 7.8* 8.8*8.8* 7.8* --  PLT 191 200198 215 --  LABCREA -- -- -- --  CREATININE -- -- -- 3.98*   Estimated Creatinine Clearance: 9.9 ml/min (by C-G formula based on Cr of 3.98). No results found for this basename: VANCOTROUGH:2,VANCOPEAK:2,VANCORANDOM:2,GENTTROUGH:2,GENTPEAK:2,GENTRANDOM:2,TOBRATROUGH:2,TOBRAPEAK:2,TOBRARND:2,AMIKACINPEAK:2,AMIKACINTROU:2,AMIKACIN:2, in the last 72 hours   Microbiology: Recent Results (from the past 720 hour(s))  CULTURE, BLOOD (ROUTINE X 2)     Status: Normal (Preliminary result)   Collection Time   03/29/12 12:01 AM      Component Value Range Status Comment   Specimen Description BLOOD LEFT ARM   Final    Special Requests BOTTLES DRAWN AEROBIC ONLY 5CC   Final    Culture  Setup Time 03/29/2012 03:12   Final    Culture     Final    Value: GRAM NEGATIVE RODS     Note: Gram Stain Report Called to,Read Back By and Verified With: AL WILLIAMS 03/29/12 @ 2:31PM BY RUSCA.   Report Status PENDING   Incomplete   MRSA PCR SCREENING     Status: Normal   Collection  Time   03/29/12 12:01 AM      Component Value Range Status Comment   MRSA by PCR NEGATIVE  NEGATIVE Final   CULTURE, BLOOD (ROUTINE X 2)     Status: Normal (Preliminary result)   Collection Time   03/29/12 12:10 AM      Component Value Range Status Comment   Specimen Description BLOOD LEFT HAND   Final    Special Requests BOTTLES DRAWN AEROBIC ONLY 5CC   Final    Culture  Setup Time 03/29/2012 03:11   Final    Culture     Final    Value: GRAM NEGATIVE RODS     Note: Gram Stain Report Called to,Read Back By and Verified With: AL WILLIAMS 03/29/12 @ 2:31PM BY RUSCA.   Report Status PENDING   Incomplete     Medical History: Past Medical History  Diagnosis Date  . Atrial fibrillation   . CAD (coronary artery disease)   . Hypertension   . Anemia   . MRSA bacteremia     hx  . Cellulitis   . GERD (gastroesophageal reflux disease)   . Acetabulum fracture   . Contracture of elbow     L arm  . Dementia   . Diabetes mellitus     on Insulin  . Decubitus ulcer, buttock 02/07/2011    Pt presents  with dressing to R buttocks/coccyx.  . Pneumonia   . Blood transfusion   . Stroke ~ 1990's    Left side paralysis.  Left  foot drop., L arm contracted.  . Chronic kidney disease 03/13/11    dialysis at [facility]; TTS,last tx 03/12/11 hemo x 20years  . Depression   . Hyperlipidemia     Medications:  Prescriptions prior to admission  Medication Sig Dispense Refill  . acetaminophen (TYLENOL) 325 MG tablet Take 325-650 mg by mouth every 4 (four) hours as needed. For headache.      Marland Kitchen amLODipine (NORVASC) 5 MG tablet Take 5 mg by mouth at bedtime.      . calcium acetate (PHOSLO) 667 MG capsule Take 667 mg by mouth 3 (three) times daily with meals.       . darbepoetin (ARANESP) 200 MCG/0.4ML SOLN Inject 200 mcg into the vein every Thursday with hemodialysis. Thursday with dialysis      . docusate sodium (COLACE) 100 MG capsule Take 100 mg by mouth 2 (two) times daily.       . DULoxetine  (CYMBALTA) 20 MG capsule Take 20 mg by mouth daily before breakfast.       . insulin aspart (NOVOLOG) 100 UNIT/ML injection Inject 5 Units into the skin 2 (two) times daily before lunch and supper.       . insulin glargine (LANTUS) 100 UNIT/ML injection Inject 5 Units into the skin at bedtime.      . lidocaine-prilocaine (EMLA) cream Apply 1 application topically Every Tuesday,Thursday,and Saturday with dialysis.      . Menthol-Zinc Oxide (REMEDY CALAZIME EX) Apply 1 application topically daily. To sacrum      . metoprolol tartrate (LOPRESSOR) 25 MG tablet Take 25 mg by mouth See admin instructions. Twice a day on Sunday, Monday, Wednesday, Friday; once daily after dialysis on Tuesday, Thursday, Saturday      . multivitamin (RENA-VIT) TABS tablet Take 1 tablet by mouth daily.      . Nutritional Supplements (FEEDING SUPPLEMENT, NEPRO CARB STEADY,) LIQD Take 237 mLs by mouth 2 (two) times daily with a meal.       . omeprazole (PRILOSEC) 20 MG capsule Take 40 mg by mouth daily.       Marland Kitchen oxyCODONE-acetaminophen (PERCOCET/ROXICET) 5-325 MG per tablet Take 1-2 tablets by mouth every 4 (four) hours as needed. For pain.  Give 1 tablet for pain scale 1-5 and give 2 tablets for pain scale 6-10.      Marland Kitchen paricalcitol (ZEMPLAR) 2 MCG/ML injection Inject 1 mcg into the vein 3 (three) times a week. Inject during dialysis.      Marland Kitchen simvastatin (ZOCOR) 20 MG tablet Take 20 mg by mouth at bedtime.       Marland Kitchen warfarin (COUMADIN) 5 MG tablet Take 5 mg by mouth daily.       Assessment: 79 y.o female with Blood cultures 2/2 growing gram-negative rods on 03/29/2012. Start Zosyn per pharmacy.  History of  End-stage renal disease on dialysis.   Plan:  Zosyn 2.25 gm IV q8h Follow up cultures, clinical status daily.   Noah Delaine, RPh Clinical Pharmacist Pager: [phone] 03/29/2012,5:15 PM

## 2012-03-29 NOTE — Consult Note (Signed)
[facility] Renal Consultation Note  Indication for Consultation:  Management of ESRD/hemodialysis; anemia, hypertension/volume and secondary hyperparathyroidism  HPI: Ebony [name] is a 79 y.o. female admitted with Hematocheazia  And hgb  7.8 with prior Nov. 2013 admit hgb 12 and hgb  out pt,  [facility] 8.0 hgb  03/20/12 with  epogen increased  3000 to 6000. Her SN[facility] reported" dizziness and lightheaded past few days"  Noted on Coumadin for Atrial Fibrillation and INR 3.Marland Kitchen01 and Staff at [facility] reporting "bright red blood in her stools. With Coumadin held since Jan 14 because of rectal bleeding". She denies any abdominal pain.  GI consulting with Pt, 1u nit PRBCS  transfusion  Ordered  today and Vit  k 5mg  po given and FFP ordered    Last hd was past Thursday without any changes reported.   Today on exam she is sleeping, awoken and she is confused Pleasantly, only oriented to person , knows me from kidney center. Does not  Know why she is here and does not remember getting here,she has  History of CVAs with Left sided hemiparesis with left upper extremity contracture.She is wheelchair bound  Does not stand for out pt wts. At kidney center..       Past Medical History  Diagnosis Date  . Atrial fibrillation   . CAD (coronary artery disease)   . Hypertension   . Anemia   . MRSA bacteremia     hx  . Cellulitis   . GERD (gastroesophageal reflux disease)   . Acetabulum fracture   . Contracture of elbow     L arm  . Dementia   . Diabetes mellitus     on Insulin  . Decubitus ulcer, buttock 02/07/2011    Pt presents with dressing to R buttocks/coccyx.  . Pneumonia   . Blood transfusion   . Stroke ~ 1990's    Left side paralysis.  Left  foot drop., L arm contracted.  . Chronic kidney disease 03/13/11    dialysis at [facility]; TTS,last tx 03/12/11 hemo x 20years  . Depression   . Hyperlipidemia     Past Surgical History  Procedure Date  . Arteriovenous graft placement   .  Appendectomy   . Cholecystectomy   . Tubal ligation     bilateral  . Abdominal hysterectomy   . Av fistula placement 02/07/2011    Procedure: INSERTION OF ARTERIOVENOUS (AV) GORE-TEX GRAFT THIGH;  Surgeon: Pryor Ochoa, MD;  Location: [facility] OR;  Service: Vascular;  Laterality: Right;  insertion AVGG right thigh  . Av fistula placement 02/12/2011    Procedure: INSERTION OF ARTERIOVENOUS (AV) GORE-TEX GRAFT THIGH;  Surgeon: Nilda Simmer, MD;  Location: [facility] OR;  Service: Vascular;  Laterality: Right;  . Cataract extraction w/ intraocular lens  implant, bilateral   . Avgg removal 05/24/2011    Procedure: REMOVAL OF ARTERIOVENOUS GORETEX GRAFT (AVGG);  Surgeon: Fransisco Hertz, MD;  Location: [facility] OR;  Service: Vascular;  Laterality: Right;  with bovine patch angioplasty  . Application of wound vac 05/24/2011    Procedure: APPLICATION OF WOUND VAC;  Surgeon: Fransisco Hertz, MD;  Location: [facility] OR;  Service: Vascular;  Laterality: Right;  . Insertion of dialysis catheter 10/03/2011    Procedure: INSERTION OF DIALYSIS CATHETER;  Surgeon: Fransisco Hertz, MD;  Location: [facility] OR;  Service: Vascular;  Laterality: Left;  Insertion left femoral tunneled dialysis catheter  . Removal of a dialysis catheter 01/16/2012  Procedure: REMOVAL OF A DIALYSIS CATHETER;  Surgeon: Fransisco Hertz, MD;  Location: [facility] OR;  Service: Vascular;  Laterality: Left;  . Insertion of dialysis catheter 01/16/2012    Procedure: INSERTION OF DIALYSIS CATHETER;  Surgeon: Fransisco Hertz, MD;  Location: [facility] OR;  Service: Vascular;  Laterality: Left;      Family History  Problem Relation Age of Onset  . Diabetes Mother   . Hypertension Father   . Anesthesia problems Neg Hx   . Hypotension Neg Hx   . Malignant hyperthermia Neg Hx   . Pseudochol deficiency Neg Hx   Social= Living at [facility], has one son in town, very little other voiced history  Given to me but by records she    reports that she quit smoking about 12 years ago. Her smoking use  included Cigarettes. She has a 7.5 pack-year smoking history. She has never used smokeless tobacco. She reports that she does not drink alcohol or use illicit drugs.   Allergies  Allergen Reactions  . Aspirin     Unknown    . Heparin     SHE is HIT neg; platelets have been normal on low dose heparin at out pt dialysis ; not clear why this is listed   . Minoxidil     Unknown    . Penicillins     Unknown      Prior to Admission medications   Medication Sig Start Date End Date Taking? Authorizing Provider  acetaminophen (TYLENOL) 325 MG tablet Take 325-650 mg by mouth every 4 (four) hours as needed. For headache.   Yes Historical Provider, MD  amLODipine (NORVASC) 5 MG tablet Take 5 mg by mouth at bedtime. 03/17/11  Yes Srikar Cherlynn Kaiser, MD  calcium acetate (PHOSLO) 667 MG capsule Take 667 mg by mouth 3 (three) times daily with meals.  09/01/10  Yes Historical Provider, MD  darbepoetin (ARANESP) 200 MCG/0.4ML SOLN Inject 200 mcg into the vein every Thursday with hemodialysis. Thursday with dialysis   Yes Historical Provider, MD  docusate sodium (COLACE) 100 MG capsule Take 100 mg by mouth 2 (two) times daily.    Yes Historical Provider, MD  DULoxetine (CYMBALTA) 20 MG capsule Take 20 mg by mouth daily before breakfast.    Yes Historical Provider, MD  insulin aspart (NOVOLOG) 100 UNIT/ML injection Inject 5 Units into the skin 2 (two) times daily before lunch and supper.    Yes Historical Provider, MD  insulin glargine (LANTUS) 100 UNIT/ML injection Inject 5 Units into the skin at bedtime.   Yes Historical Provider, MD  lidocaine-prilocaine (EMLA) cream Apply 1 application topically Every Tuesday,Thursday,and Saturday with dialysis.   Yes Historical Provider, MD  Menthol-Zinc Oxide (REMEDY CALAZIME EX) Apply 1 application topically daily. To sacrum   Yes Historical Provider, MD  metoprolol tartrate (LOPRESSOR) 25 MG tablet Take 25 mg by mouth See admin instructions. Twice a day on Sunday, Monday,  Wednesday, Friday; once daily after dialysis on Tuesday, Thursday, Saturday   Yes Historical Provider, MD  multivitamin (RENA-VIT) TABS tablet Take 1 tablet by mouth daily.   Yes Historical Provider, MD  Nutritional Supplements (FEEDING SUPPLEMENT, NEPRO CARB STEADY,) LIQD Take 237 mLs by mouth 2 (two) times daily with a meal.    Yes Historical Provider, MD  omeprazole (PRILOSEC) 20 MG capsule Take 40 mg by mouth daily.    Yes Historical Provider, MD  oxyCODONE-acetaminophen (PERCOCET/ROXICET) 5-325 MG per tablet Take 1-2 tablets by mouth every 4 (four) hours as needed.  For pain.  Give 1 tablet for pain scale 1-5 and give 2 tablets for pain scale 6-10.   Yes Historical Provider, MD  paricalcitol (ZEMPLAR) 2 MCG/ML injection Inject 1 mcg into the vein 3 (three) times a week. Inject during dialysis.   Yes Historical Provider, MD  simvastatin (ZOCOR) 20 MG tablet Take 20 mg by mouth at bedtime.    Yes Historical Provider, MD  warfarin (COUMADIN) 5 MG tablet Take 5 mg by mouth daily.   Yes Historical Provider, MD     Anti-infectives    None      Results for orders placed during the hospital encounter of 03/28/12 (from the past 48 hour(s))  CBC WITH DIFFERENTIAL     Status: Abnormal   Collection Time   03/28/12  1:56 PM      Component Value Range Comment   WBC 7.2  4.0 - 10.5 K/uL    RBC 2.85 (*) 3.87 - 5.11 MIL/uL    Hemoglobin 7.8 (*) 12.0 - 15.0 g/dL    HCT 16.1 (*) 09.6 - 46.0 %    MCV 88.4  78.0 - 100.0 fL    MCH 27.4  26.0 - 34.0 pg    MCHC 31.0  30.0 - 36.0 g/dL    RDW 04.5 (*) 40.9 - 15.5 %    Platelets 235  150 - 400 K/uL    Neutrophils Relative 74  43 - 77 %    Neutro Abs 5.3  1.7 - 7.7 K/uL    Lymphocytes Relative 16  12 - 46 %    Lymphs Abs 1.2  0.7 - 4.0 K/uL    Monocytes Relative 8  3 - 12 %    Monocytes Absolute 0.6  0.1 - 1.0 K/uL    Eosinophils Relative 1  0 - 5 %    Eosinophils Absolute 0.1  0.0 - 0.7 K/uL    Basophils Relative 0  0 - 1 %    Basophils Absolute 0.0   0.0 - 0.1 K/uL   BASIC METABOLIC PANEL     Status: Abnormal   Collection Time   03/28/12  1:56 PM      Component Value Range Comment   Sodium 134 (*) 135 - 145 mEq/L    Potassium 3.5  3.5 - 5.1 mEq/L    Chloride 92 (*) 96 - 112 mEq/L    CO2 31  19 - 32 mEq/L    Glucose, Bld 230 (*) 70 - 99 mg/dL    BUN 15  6 - 23 mg/dL    Creatinine, Ser 8.11 (*) 0.50 - 1.10 mg/dL    Calcium 9.2  8.4 - 91.4 mg/dL    GFR calc non Af Amer 10 (*) >90 mL/min    GFR calc Af Amer 11 (*) >90 mL/min   PROTIME-INR     Status: Abnormal   Collection Time   03/28/12  1:56 PM      Component Value Range Comment   Prothrombin Time 29.6 (*) 11.6 - 15.2 seconds    INR 3.01 (*) 0.00 - 1.49   OCCULT BLOOD, POC DEVICE     Status: Abnormal   Collection Time   03/28/12  1:56 PM      Component Value Range Comment   Fecal Occult Bld POSITIVE (*) NEGATIVE   TYPE AND SCREEN     Status: Normal   Collection Time   03/28/12  2:20 PM      Component Value Range Comment   ABO/RH(D) [place]  POS      Antibody Screen NEG      Sample Expiration 03/31/2012      Unit Number [id number]      Blood Component Type RBC LR PHER1      Unit division 00      Status of Unit ISSUED,FINAL      Transfusion Status OK TO TRANSFUSE      Crossmatch Result Compatible     POCT I-STAT TROPONIN I     Status: Normal   Collection Time   03/28/12  2:32 PM      Component Value Range Comment   Troponin i, poc 0.02  0.00 - 0.08 ng/mL    Comment 3            PREPARE FRESH FROZEN PLASMA     Status: Normal (Preliminary result)   Collection Time   03/28/12  4:00 PM      Component Value Range Comment   Unit Number [id number]      Blood Component Type THAWED PLASMA      Unit division 00      Status of Unit ISSUED      Transfusion Status OK TO TRANSFUSE      Unit Number [id number]      Blood Component Type THAWED PLASMA      Unit division 00      Status of Unit ISSUED      Transfusion Status OK TO TRANSFUSE     PREPARE RBC (CROSSMATCH)     Status:  Normal   Collection Time   03/28/12  4:30 PM      Component Value Range Comment   Order Confirmation ORDER PROCESSED BY BLOOD BANK     CBC     Status: Abnormal   Collection Time   03/28/12  9:58 PM      Component Value Range Comment   WBC 8.3  4.0 - 10.5 K/uL    RBC 3.03 (*) 3.87 - 5.11 MIL/uL    Hemoglobin 8.8 (*) 12.0 - 15.0 g/dL    HCT 52.8 (*) 41.3 - 46.0 %    MCV 87.1  78.0 - 100.0 fL    MCH 29.0  26.0 - 34.0 pg    MCHC 33.3  30.0 - 36.0 g/dL    RDW 24.4 (*) 01.0 - 15.5 %    Platelets 215  150 - 400 K/uL   TRANSFUSION REACTION     Status: Normal (Preliminary result)   Collection Time   03/28/12 11:36 PM      Component Value Range Comment   Post RXN DAT IgG NEG      DAT C3 NEG      Path interp tx rxn PENDING     CBC     Status: Abnormal   Collection Time   03/29/12 12:01 AM      Component Value Range Comment   WBC 9.0  4.0 - 10.5 K/uL    RBC 2.72 (*) 3.87 - 5.11 MIL/uL    Hemoglobin 7.8 (*) 12.0 - 15.0 g/dL    HCT 27.2 (*) 53.6 - 46.0 %    MCV 86.8  78.0 - 100.0 fL    MCH 28.7  26.0 - 34.0 pg    MCHC 33.1  30.0 - 36.0 g/dL    RDW 64.4 (*) 03.4 - 15.5 %    Platelets 215  150 - 400 K/uL   MRSA PCR SCREENING     Status: Normal   Collection Time  03/29/12 12:01 AM      Component Value Range Comment   MRSA by PCR NEGATIVE  NEGATIVE   GLUCOSE, CAPILLARY     Status: Abnormal   Collection Time   03/29/12 12:09 AM      Component Value Range Comment   Glucose-Capillary 213 (*) 70 - 99 mg/dL   GLUCOSE, CAPILLARY     Status: Abnormal   Collection Time   03/29/12  6:09 AM      Component Value Range Comment   Glucose-Capillary 158 (*) 70 - 99 mg/dL   CBC     Status: Abnormal   Collection Time   03/29/12  7:28 AM      Component Value Range Comment   WBC 6.7  4.0 - 10.5 K/uL    RBC 3.16 (*) 3.87 - 5.11 MIL/uL    Hemoglobin 8.8 (*) 12.0 - 15.0 g/dL    HCT 16.1 (*) 09.6 - 46.0 %    MCV 87.3  78.0 - 100.0 fL    MCH 27.8  26.0 - 34.0 pg    MCHC 31.9  30.0 - 36.0 g/dL    RDW  04.5 (*) 40.9 - 15.5 %    Platelets 200  150 - 400 K/uL   PROTIME-INR     Status: Abnormal   Collection Time   03/29/12  7:28 AM      Component Value Range Comment   Prothrombin Time 17.8 (*) 11.6 - 15.2 seconds    INR 1.51 (*) 0.00 - 1.49   CBC     Status: Abnormal   Collection Time   03/29/12  7:28 AM      Component Value Range Comment   WBC 6.6  4.0 - 10.5 K/uL    RBC 3.21 (*) 3.87 - 5.11 MIL/uL    Hemoglobin 8.8 (*) 12.0 - 15.0 g/dL    HCT 81.1 (*) 91.4 - 46.0 %    MCV 87.9  78.0 - 100.0 fL    MCH 27.4  26.0 - 34.0 pg    MCHC 31.2  30.0 - 36.0 g/dL    RDW 78.2 (*) 95.6 - 15.5 %    Platelets 198  150 - 400 K/uL   GLUCOSE, CAPILLARY     Status: Abnormal   Collection Time   03/29/12 11:39 AM      Component Value Range Comment   Glucose-Capillary 138 (*) 70 - 99 mg/dL   CBC     Status: Abnormal   Collection Time   03/29/12 12:15 PM      Component Value Range Comment   WBC 7.3  4.0 - 10.5 K/uL    RBC 2.82 (*) 3.87 - 5.11 MIL/uL    Hemoglobin 7.8 (*) 12.0 - 15.0 g/dL    HCT 21.3 (*) 08.6 - 46.0 %    MCV 86.5  78.0 - 100.0 fL    MCH 27.7  26.0 - 34.0 pg    MCHC 32.0  30.0 - 36.0 g/dL    RDW 57.8 (*) 46.9 - 15.5 %    Platelets 191  150 - 400 K/uL    .  ROS: Pt unable to give me any history, history  Pos. As above in HPI   Physical Exam: Filed Vitals:   03/29/12 1141  BP: 115/44  Pulse: 66  Temp: 100.3 F (37.9 C)  Resp: 12     General: Lethargic elderly chronically ill  Thin BF,awoken from sleep and  Gives minimal response to questions HEENT: [place], mmm Eyes:  eomi Neck: supple, no jvd Heart:  Irreg, irreg, 67 hear rate, 1/6 sem lsb. No rub.   A. Fib on tele Lungs: CTA , slightly poor resp effort Abdomen: BS pos. Normal , soft, nontender Extremities:  No pedal edema , left hand contracture and arm contracture Skin: no rash, dry scaling skin  Bilat. Feet no overt ulcers Neuro: orient. To person only, pleasantly confused , does follow request to extm movement  With  left upper and lower  Extr. paresis Dialysis Access: left fem. avgg faintly positive.bruit  Dialysis Orders: Center: [facility]  on TTS . EDW 67kg HD Bath 2.0 k,2.25 Ca  Time 4.0 hrs Heparin 1000units bolus then 1800 units. Access left fem avgg BFR 400 DFR 800          i pth (12/27/11= 204) Hectoral 2.0 mcg mcg IV/HD Epogen 6000   Units IV/HD  Venofer  0  TFS =40 %( 12/16/ 13 ) Other 0  Assessment/Plan 1. Hematochezia on  Coumadin  Slightly Supra therapeutic  Anticoagulation= Gi  following  With Vitamin K 5mg  PO X1 given and FFP ordered  For INR 3.01 now down to 1.51 2. ESRD -  TTS at [facility]   NO HEPARIN HD Today K 3.5 fu am k with blood given and  Check shuntogram with faint bruit  Portion of avgg clotted Venous side   Able to use one side of avgg today  3. Hypertension/volume  -  Noted WT 74. 0  ?? 7 kg over edw / bps 104 -117 sbp  Will dc amlodipine for now  And need for uf on hd/ CXR  showing No excess vol  bp 93/63 with my exam. May need to taper back Metoprolol / V Rate stable in 60s  4. Anemia  - HGB= 7.8  As above gi bleed, and ESRD etiologies , increase her aranesp  to 200 / on no iron  5. Metabolic bone disease -  hectoral  On hd and binders with diet , fu ca and phos  6. Nutrition - will need high protein  Renal when solids and Nepro supplement , renal vitamin 7. Atrial Fibrillation ( on Coumadin  With slightly Supra therap. INR)-  Heart. Rate in 60s  May need to taper back metoprolol if bp drops with hd / hold am of hd.  Lenny Pastel, PA-C [facility] [phone] 03/29/2012, 1:03 PM   Patient seen and examined and above reviewed.  Agree with assessment and plan as above. [name]  MD [employer] [phone] pgr    [phone] cell 03/29/2012, 6:04 PM

## 2012-03-29 NOTE — Progress Notes (Signed)
Pt's diastolic is still in the 30s and 40s, and respirations in the 30s and upper 20s which hasn't changed from how they were when she came in. PA on call has been made aware.------Augustino Savastano, rn

## 2012-03-30 DIAGNOSIS — K922 Gastrointestinal hemorrhage, unspecified: Secondary | ICD-10-CM

## 2012-03-30 LAB — TYPE AND SCREEN: Unit division: 0

## 2012-03-30 LAB — BASIC METABOLIC PANEL
Calcium: 9.3 mg/dL (ref 8.4–10.5)
GFR calc Af Amer: 14 mL/min — ABNORMAL LOW (ref >90)
GFR calc non Af Amer: 12 mL/min — ABNORMAL LOW (ref >90)
Potassium: 3.1 mEq/L — ABNORMAL LOW (ref 3.5–5.1)
Sodium: 138 mEq/L (ref 135–145)

## 2012-03-30 LAB — GLUCOSE, CAPILLARY: Glucose-Capillary: 180 mg/dL — ABNORMAL HIGH (ref 70–99)

## 2012-03-30 LAB — PREPARE FRESH FROZEN PLASMA: Unit division: 0

## 2012-03-30 LAB — CBC
MCH: 27.3 pg (ref 26.0–34.0)
MCHC: 31.4 g/dL (ref 30.0–36.0)
Platelets: 207 10*3/uL (ref 150–400)
RDW: 16.9 % — ABNORMAL HIGH (ref 11.5–15.5)

## 2012-03-30 MED ORDER — POTASSIUM CHLORIDE CRYS ER 20 MEQ PO TBCR
20.0000 meq | EXTENDED_RELEASE_TABLET | Freq: Once | ORAL | Status: AC
  Administered 2012-03-30: 20 meq via ORAL
  Filled 2012-03-30: qty 1

## 2012-03-30 NOTE — Progress Notes (Signed)
Subjective: No complaints, Hb up to 9.2 after transfusion yest. Had fever overnight and blood cx's + for GNR's x 2  Objective Vital signs in last 24 hours: Filed Vitals:   03/29/12 2018 03/29/12 2200 03/30/12 0049 03/30/12 0407  BP: 136/42 105/51 111/40 109/47  Pulse: 94 68 65 67  Temp: 102.6 F (39.2 C)  99.4 F (37.4 C) 98.5 F (36.9 C)  TempSrc: Oral  Oral Oral  Resp: 20 13 28    Height:      Weight:    71 kg (156 lb 8.4 oz)  SpO2: 94% 94% 96% 96%   Weight change: -2.6 kg (-5 lb 11.7 oz)  Intake/Output Summary (Last 24 hours) at 03/30/12 0711 Last data filed at 03/29/12 2200  Gross per 24 hour  Intake 969.58 ml  Output     -8 ml  Net 977.58 ml   Labs: Basic Metabolic Panel:  Lab 03/30/12 1610 03/28/12 1356  NA 138 134*  K 3.1* 3.5  CL 96 92*  CO2 29 31  GLUCOSE 143* 230*  BUN 14 15  CREATININE 3.36* 3.98*  ALB -- --  CALCIUM 9.3 9.2  PHOS -- --   Liver Function Tests: No results found for this basename: AST:3,ALT:3,ALKPHOS:3,BILITOT:3,PROT:3,ALBUMIN:3 in the last 168 hours No results found for this basename: LIPASE:3,AMYLASE:3 in the last 168 hours No results found for this basename: AMMONIA:3 in the last 168 hours CBC:  Lab 03/30/12 0545 03/29/12 2006 03/29/12 1215 03/29/12 0728 03/28/12 1356  WBC 8.7 11.3* 7.3 6.76.6 --  NEUTROABS -- -- -- -- 5.3  HGB 9.5* 10.2* 7.8* 8.8*8.8* --  HCT 30.3* 31.1* 24.4* 27.6*28.2* --  MCV 87.1 85.4 86.5 87.387.9 --  PLT 207 199 191 200198 --   PT/INR: @labrcntip (inr:5) Cardiac Enzymes: No results found for this basename: CKTOTAL:5,CKMB:5,CKMBINDEX:5,TROPONINI:5 in the last 168 hours CBG:  Lab 03/29/12 2320 03/29/12 1139 03/29/12 0609 03/29/12 0009  GLUCAP 146* 138* 158* 213*    Iron Studies: No results found for this basename: IRON:30,TIBC:30,TRANSFERRIN:30,FERRITIN:30 in the last 168 hours  Physical Exam:  Blood pressure 109/47, pulse 67, temperature 98.5 F (36.9 C), temperature source Oral, resp. rate 28,  height 5' (1.524 m), weight 71 kg (156 lb 8.4 oz), SpO2 96.00%.  Gen: pleasant, alert, responds appropriately Neck: no JVD Heart: irreg, 67 hear rate, 1/6 sem lsb. No rub. A. Fib on tele  Lungs: CTA bilat Abdomen: BS pos. Normal , soft, nontender  Ext: no pedal edema , left hand contracture and arm contracture  Skin: no rash, dry scaling skin bilat feet no overt ulcers  Neuro: oriented to person only, dense L hemiparesis Dialysis Access: left fem. avgg faintly positive.bruit, no gross infection   Dialysis Orders: Center: [facility] on TTS .  EDW 67kg HD Bath 2.0 k,2.25 Ca Time 4.0 hrs Heparin 1000units bolus then 1800 units. Access left fem avgg BFR 400 DFR 800 i pth (12/27/11= 204)  Hectoral 2.0 mcg mcg IV/HD Epogen 6000 Units IV/HD Venofer 0 TFS =40 %( 12/16/ 13 )  Other 0   Assessment/Plan  1. Lower GIB / BRBPR- Hb 7.8 to 9.2 after 2u prbc's. GI consulting, correcting INR, clear liquids, colonscopy at some point per GI 2. Fever/gram neg bacteremia- on zosyn IV, could be GI source related to GIB; thigh AVG infection another possibility but nothing obvious on exam. Keep close eye on AVG 3. ESRD, cont tts HD, no heparin due to GIB. Check shuntogram Monday (ordered) with faint bruit, portion of AVG appears clotted 4. HTN/volume-  weights up 4-8kg, ?accuracy as BP down and exam doesn't suggest vol overload. D/c'd Norvasc, on metoprolol only now. No UF with HD yest due to low BP's  5. Anemia due to CKD/ABLA-  increased aranesp to 200 / on no iron  6. Metabolic bone disease - cont vit D and binders 7. Nutrition - will need high protein Renal when solids and Nepro supplement , renal vitamin 8. Atrial Fibrillation ( on Coumadin With slightly Supra therap. INR)- Heart. Rate in 60s May need to taper back metoprolol if bp drops with hd / hold am of hd   [name]  MD [facility] [phone] pgr    [phone] cell 03/30/2012, 7:11 AM

## 2012-03-30 NOTE — Progress Notes (Signed)
Pt arrived from 2600. Alert and oriented x 3. No acute distress. VSS. Skin intact. L thigh graft ++. Oriented pt to unit. Bed in low position. Call bell within reach. Will continue to monitor.

## 2012-03-30 NOTE — Progress Notes (Signed)
Pt alternates between complete clarity/tearfull episodes and confusion. Sleeps throughout shift. Awaken for meals and care. OOB in chair ( 2 x max assisst) for 4 hours. Attention given to healing ulcer on buttocks. Appetite improving. Pt removes n/c intermittently with sats dropping below 85%. Will continue to provide a safe environment.until pending transfer.

## 2012-03-30 NOTE — Progress Notes (Signed)
[facility] PROGRESS NOTE  Ebony [name] WJX:[id number] DOB: 1930-02-26 DOA: 03/28/2012 PCP: Johny Sax, MD  Assessment/Plan: GI bleed Etiology unclear.  Patient transfused 1 unit of PRBC on 03/28/2012, transfused another unit of pRBC on 03/29/2012.  Patient given 5 mg of oral vitamin K.  Patient transfused 2 units of FFP.  GI bleed presumed to be diverticular in origin, GI not planning on intervention unless bleeding recurs.  Bacteremia Blood cultures 2/2 growing gram-negative rods on 03/29/2012.  Started Zosyn per pharmacy on 03/29/2012. Source unclear. Left thigh graft did not appear to be infected. GI/GU source? monitor carefully.  Acute blood loss anemia due to GI bleed Patient transfused 1 unit of PRBC on 03/28/2012, transfused another unit of pRBC on 03/29/2012.  Supratherapeutic INR Corrected with FFP and vitamin K.  Atrial fibrillation Rate controlled.  Not on anticoagulation given concern for GI bleed.  End-stage renal disease Hemodialysis as per renal service.  Diabetes Sliding scale insulin.  Hyperlipidemia Statin.  Hypertension Stable.  Depression Continue Cymbalta.  Diabetes  SSI.   Hypokalemia Replace as needed.  Prophylaxis  SCDs, not on heparin due to GI bleed.   Code Status: Full code Family Communication: No family at bedside Disposition Plan: Pending. Transfer to telemetry.   Consultants:  GI, Dr. Arlyce Dice  Renal, Dr. Arlean Hopping  Procedures:  None  Antibiotics:  Zosyn 03/29/2012 >>  HPI/Subjective: No specific concerns. Denies any chest pain or shortness of breath.  Objective: Filed Vitals:   03/29/12 2018 03/29/12 2200 03/30/12 0049 03/30/12 0407  BP: 136/42 105/51 111/40 109/47  Pulse: 94 68 65 67  Temp: 102.6 F (39.2 C)  99.4 F (37.4 C) 98.5 F (36.9 C)  TempSrc: Oral  Oral Oral  Resp: 20 13 28    Height:      Weight:    71 kg (156 lb 8.4 oz)  SpO2: 94% 94% 96% 96%    Intake/Output Summary (Last 24 hours) at  03/30/12 0858 Last data filed at 03/29/12 2200  Gross per 24 hour  Intake    470 ml  Output     -8 ml  Net    478 ml   Filed Weights   03/29/12 1425 03/29/12 1842 03/30/12 0407  Weight: 71.4 kg (157 lb 6.5 oz) 71.5 kg (157 lb 10.1 oz) 71 kg (156 lb 8.4 oz)    Exam: Physical Exam: General: Awake, Oriented, No acute distress. HEENT: EOMI. Neck: Supple CV: S1 and S2 Lungs: Clear to ascultation bilaterally Abdomen: Soft, Nontender, Nondistended, +bowel sounds. Ext: Good pulses. Trace edema. Left thigh graft with no surrounding erythema or redness.  Data Reviewed: Basic Metabolic Panel:  Lab 03/30/12 2130 03/28/12 1356  NA 138 134*  K 3.1* 3.5  CL 96 92*  CO2 29 31  GLUCOSE 143* 230*  BUN 14 15  CREATININE 3.36* 3.98*  CALCIUM 9.3 9.2  MG -- --  PHOS -- --   Liver Function Tests: No results found for this basename: AST:5,ALT:5,ALKPHOS:5,BILITOT:5,PROT:5,ALBUMIN:5 in the last 168 hours No results found for this basename: LIPASE:5,AMYLASE:5 in the last 168 hours No results found for this basename: AMMONIA:5 in the last 168 hours CBC:  Lab 03/30/12 0545 03/29/12 2006 03/29/12 1215 03/29/12 0728 03/29/12 0001 03/28/12 1356  WBC 8.7 11.3* 7.3 6.76.6 9.0 --  NEUTROABS -- -- -- -- -- 5.3  HGB 9.5* 10.2* 7.8* 8.8*8.8* 7.8* --  HCT 30.3* 31.1* 24.4* 27.6*28.2* 23.6* --  MCV 87.1 85.4 86.5 87.387.9 86.8 --  PLT 207 199 191 200198  215 --   Cardiac Enzymes: No results found for this basename: CKTOTAL:5,CKMB:5,CKMBINDEX:5,TROPONINI:5 in the last 168 hours BNP (last 3 results) No results found for this basename: PROBNP:3 in the last 8760 hours CBG:  Lab 03/30/12 0840 03/29/12 2320 03/29/12 1139 03/29/12 0609 03/29/12 0009  GLUCAP 124* 146* 138* 158* 213*    Recent Results (from the past 240 hour(s))  CULTURE, BLOOD (ROUTINE X 2)     Status: Normal (Preliminary result)   Collection Time   03/29/12 12:01 AM      Component Value Range Status Comment   Specimen  Description BLOOD LEFT ARM   Final    Special Requests BOTTLES DRAWN AEROBIC ONLY 5CC   Final    Culture  Setup Time 03/29/2012 03:12   Final    Culture     Final    Value: GRAM NEGATIVE RODS     Note: Gram Stain Report Called to,Read Back By and Verified With: AL WILLIAMS 03/29/12 @ 2:31PM BY RUSCA.   Report Status PENDING   Incomplete   MRSA PCR SCREENING     Status: Normal   Collection Time   03/29/12 12:01 AM      Component Value Range Status Comment   MRSA by PCR NEGATIVE  NEGATIVE Final   CULTURE, BLOOD (ROUTINE X 2)     Status: Normal (Preliminary result)   Collection Time   03/29/12 12:10 AM      Component Value Range Status Comment   Specimen Description BLOOD LEFT HAND   Final    Special Requests BOTTLES DRAWN AEROBIC ONLY 5CC   Final    Culture  Setup Time 03/29/2012 03:11   Final    Culture     Final    Value: GRAM NEGATIVE RODS     Note: Gram Stain Report Called to,Read Back By and Verified With: AL WILLIAMS 03/29/12 @ 2:31PM BY RUSCA.   Report Status PENDING   Incomplete      Studies: Dg Chest Port 1 View  03/29/2012  *RADIOLOGY REPORT*  Clinical Data: 79 year old female with fever, shortness of breath and cough.  PORTABLE CHEST - 1 VIEW  Comparison: 01/16/2012 and prior chest radiographs  Findings: Upper limits normal heart size again noted. Left basilar scarring is present. There is no evidence of focal airspace disease, pulmonary edema, suspicious pulmonary nodule/mass, pleural effusion, or pneumothorax. No acute bony abnormalities are identified.  IMPRESSION: No evidence of acute cardiopulmonary disease.   Original Report Authenticated By: Harmon Pier, M.D.     Scheduled Meds:    . calcium acetate  667 mg Oral TID WC  . darbepoetin (ARANESP) injection - DIALYSIS  200 mcg Intravenous Q Sat-HD  . docusate sodium  100 mg Oral BID  . DULoxetine  20 mg Oral QAC breakfast  . feeding supplement (NEPRO CARB STEADY)  237 mL Oral BID WC  . insulin aspart  0-5 Units  Subcutaneous QHS  . insulin aspart  0-9 Units Subcutaneous TID WC  . metoprolol tartrate  25 mg Oral Custom  . metoprolol tartrate  25 mg Oral Custom  . multivitamin  1 tablet Oral QHS  . pantoprazole  40 mg Oral BID  . paricalcitol  4 mcg Intravenous Q T,Th,Sa-HD  . piperacillin-tazobactam (ZOSYN)  IV  2.25 g Intravenous Q8H  . potassium chloride  20 mEq Oral Once  . simvastatin  20 mg Oral QHS   Continuous Infusions:   Active Problems:  GI bleed  Supratherapeutic INR  ESRD (end stage renal  disease) on dialysis  CVA, old, hemiparesis  Acute posthemorrhagic anemia   Kwanza Cancelliere A  [facility] Pager [phone]. If 7PM-7AM, please contact night-coverage at [email or web address], password [facility] 03/30/2012, 8:58 AM  LOS: 2 days

## 2012-03-30 NOTE — Progress Notes (Signed)
Patient ID: Ebony [name], female   DOB: [birth year], 79 y.o.   MRN: [id number] [facility] Progress Note  Subjective: Tearrful-wants to go home.. Tired of being stuck... No bleeding, and no c/o abdominal pain, eating without difficulty  Objective:  Vital signs in last 24 hours: Temp:  [98.1 F (36.7 C)-102.6 F (39.2 C)] 98.7 F (37.1 C) (01/19 0800) Pulse Rate:  [55-94] 55  (01/19 0800) Resp:  [0-68] 0  (01/19 0800) BP: (71-136)/(36-57) 71/38 mmHg (01/19 0800) SpO2:  [94 %-100 %] 95 % (01/19 0800) Weight:  [156 lb 8.4 oz (71 kg)-157 lb 10.1 oz (71.5 kg)] 156 lb 8.4 oz (71 kg) (01/19 0407) Last BM Date: 03/27/12 General:   Alert,  Well-developed,  Elderly AA female  in NAD,upset Heart: ir Regular rate and rhythm; no murmurs Pulm;clear ant Abdomen:  Soft, nontender and nondistended. Normal bowel sounds, without guarding, and without rebound.   Extremities:  Without edema. Neurologic:  Alert and  oriented x4; left hemiparesis with  Contracture UE Psych:  Alert and cooperative. Intake/Output from previous day: 01/18 0701 - 01/19 0700 In: 969.6 [P.O.:440; I.V.:429.6; IV Piggyback:100] Out: -8  Intake/Output this shift: Total I/O In: 250 [P.O.:250] Out: -   Lab Results:  Basename 03/30/12 0545 03/29/12 2006 03/29/12 1215  WBC 8.7 11.3* 7.3  HGB 9.5* 10.2* 7.8*  HCT 30.3* 31.1* 24.4*  PLT 207 199 191   BMET  Basename 03/30/12 0545 03/28/12 1356  NA 138 134*  K 3.1* 3.5  CL 96 92*  CO2 29 31  GLUCOSE 143* 230*  BUN 14 15  CREATININE 3.36* 3.98*  CALCIUM 9.3 9.2   LFT No results found for this basename: PROT,ALBUMIN,AST,ALT,ALKPHOS,BILITOT,BILIDIR,IBILI in the last 72 hours PT/INR  Basename 03/29/12 0728 03/28/12 1356  LABPROT 17.8* 29.6*  INR 1.51* 3.01*     Assessment / Plan: #1  79 yo female with acute lower GI bleed in setting of supratherapeutic INR- no furhter bleeding since INR corrected. Suspect Diverticular. No endoscopic intervention  planned Will advance to renal diet #2 bacteremia #3  Atrial fib #4 hx of CVA/mild dementia #5 ESRD -on dialysis #^ anemia-acute and chronic -stable  GI will sign off available if needed- Ok to resume Coumadin prior to discharge  If felt she has to stay anticoagulated  Active Problems:  Bacteremia  GI bleed  Supratherapeutic INR  ESRD (end stage renal disease) on dialysis  CVA, old, hemiparesis  Acute posthemorrhagic anemia     LOS: 2 days   Amy Esterwood  03/30/2012, 10:15 AM  I have personally taken an interval history, reviewed the chart, and examined the patient.  I agree with the extender's note, impression and recommendations.  Barbette Hair. Arlyce Dice, MD, [facility] [phone]

## 2012-03-31 ENCOUNTER — Inpatient Hospital Stay (HOSPITAL_COMMUNITY): Payer: Medicare Other

## 2012-03-31 DIAGNOSIS — E119 Type 2 diabetes mellitus without complications: Secondary | ICD-10-CM

## 2012-03-31 DIAGNOSIS — I6340 Cerebral infarction due to embolism of unspecified cerebral artery: Secondary | ICD-10-CM

## 2012-03-31 DIAGNOSIS — R7881 Bacteremia: Secondary | ICD-10-CM

## 2012-03-31 LAB — CULTURE, BLOOD (ROUTINE X 2)

## 2012-03-31 LAB — GLUCOSE, CAPILLARY: Glucose-Capillary: 121 mg/dL — ABNORMAL HIGH (ref 70–99)

## 2012-03-31 LAB — BASIC METABOLIC PANEL
BUN: 22 mg/dL (ref 6–23)
CO2: 25 mEq/L (ref 19–32)
GFR calc non Af Amer: 7 mL/min — ABNORMAL LOW (ref >90)
Glucose, Bld: 123 mg/dL — ABNORMAL HIGH (ref 70–99)
Potassium: 3 mEq/L — ABNORMAL LOW (ref 3.5–5.1)

## 2012-03-31 LAB — CBC
HCT: 28.7 % — ABNORMAL LOW (ref 36.0–46.0)
Hemoglobin: 9.2 g/dL — ABNORMAL LOW (ref 12.0–15.0)
MCHC: 32.1 g/dL (ref 30.0–36.0)

## 2012-03-31 LAB — TRANSFUSION REACTION: Post RXN DAT IgG: NEGATIVE

## 2012-03-31 MED ORDER — CIPROFLOXACIN HCL 500 MG PO TABS: 500.0000 mg | ORAL_TABLET | ORAL | Status: AC

## 2012-03-31 MED ORDER — IOHEXOL 300 MG/ML  SOLN
100.0000 mL | Freq: Once | INTRAMUSCULAR | Status: AC | PRN
  Administered 2012-03-31: 85 mL via INTRAVENOUS

## 2012-03-31 MED ORDER — CIPROFLOXACIN HCL 500 MG PO TABS
500.0000 mg | ORAL_TABLET | ORAL | Status: DC
  Administered 2012-03-31: 500 mg via ORAL
  Filled 2012-03-31: qty 1

## 2012-03-31 MED ORDER — OXYCODONE-ACETAMINOPHEN 5-325 MG PO TABS: 1.0000 | ORAL_TABLET | ORAL | Status: DC | PRN

## 2012-03-31 NOTE — Consult Note (Signed)
WOC consult Note Reason for Consult:sacral pressure ulcer. No current documented pressure ulcers, MD wanted me to evaluate, question source of infection. Pt appears to have healed pressure ulcer to the left of the gluteal skin fold, but no open wounds at the current time.   Re consult if needed, will not follow at this time. Thanks  Bette Brienza [employer], CWOCN [phone])

## 2012-03-31 NOTE — Progress Notes (Signed)
Report given to Diana at [facility].

## 2012-03-31 NOTE — Progress Notes (Signed)
CRITICAL VALUE ALERT  Critical value received:  Gram negative rods both bottles  Date of notification:  03/31/2012  Time of notification:  0445  Critical value read back: yes  Nurse who received alert:  Guilford Shi RN  MD notified (1st page):  L HARDUK  Time of first page:  0455  MD notified (2nd page):  Time of second page:  Responding MD:  Wyn Forster   Time MD responded:  0500

## 2012-03-31 NOTE — Procedures (Signed)
Normally functioning L thigh HERO graft.  No intervention performed.  No immediate complications.

## 2012-03-31 NOTE — Progress Notes (Signed)
[facility] Progress Note  Subjective:    Awake and alert but confused about why she's here Wants to know if she is going back to the "home" Had angio of femoral access today - stated "patent left thigh HERO"  Objective Filed Vitals:   03/30/12 1713 03/30/12 2115 03/31/12 0532 03/31/12 1013  BP: 129/58 135/34 120/55 120/57  Pulse: 63 63 77 69  Temp: 98.9 F (37.2 C) 100.4 F (38 C) 98.6 F (37 C)   TempSrc: Oral Oral Oral   Resp: 18 17 18 18   Height:      Weight:  72.3 kg (159 lb 6.3 oz)    SpO2: 96% 92% 91% 93%   Physical Exam General: Awake and alert NAD "am I going back to the home" Heart:S1S2 no S3 Lungs:Clear Abdomen:Soft without tenderness Extremities:Contracture left arm; No sig LE edema; bandaids and a dressing on left femoral access that has positive bruit Dialysis Access: left femoral graeft (??HERO??)  Dialysis Orders: Center: [facility] on TTS .  EDW 67kg HD Bath 2.0 k,2.25 Ca Time 4.0 hrs Heparin 1000units bolus then 1800 units. Access left fem avgg BFR 400 DFR 800 i pth (12/27/11= 204)  Hectoral 2.0 mcg mcg IV/HD Epogen 6000 Units IV/HD Venofer 0 TFS =40 %( 12/16/ 13 )    Assessment/Plan: 1. Lower GIB / BRBPR - Hb 7.8 to 9.2 after 2u prbc's. GI consulting, correcting INR, clear liquids, no endoscopic intervention planned per GI as they suspect diverticular; GI has OK'd resumption of coumadin prior to DC if felt needed (I would think high risk since bleeding source not known and required transfusion of 2 units) 2. Fever/gram neg bacteremia (E.Coli) - IV zosyn changed to Cipro 500 mg PO through 04/12/12. GI source related to GIB considered; thigh AVG infection another possibility but nothing obvious on exam. Sacral wound is clean per WOC. Will keep close eye on AVG I do not see any outward evidence if infection of the AVG but will require close followup. Sacral decub also apparently looked clean.  Antibiotics (cipro) as noted through February 1 for [facility].      3. ESRD - [facility] on TTS. No heparin due to GIB. Shuntogram of left thigh AVG completed on 1/20 secondary to faint bruit - Access found to be widely patent.  No intervention required. K+ 3.0.  Will order 4K+ bath and weekly K+ checks at outpatient center. 4. HTN/volume - Weights up by ~ 5 kg ?accuracy as BP controlled, no vol excess on exam and leaving at or slightly below EDW as outpatient. On metoprolol only now. No UF with HD saturday due to low BP's. No change in OP EDW for now. 5. Anemia due to CKD/ABLA - Hgb 9.2. On Aanesp to 200. Tsat 40% in December. Last Ferritin 928 on October. No IV Fe for now. 6. Metabolic bone disease - Continue vit D and binders 7. Nutrition - Will need high protein Renal  and Nepro supplement , renal vitamin 8. Atrial Fibrillation ( on Coumadin,  INR now 1.51 after Vit. K) - Heart. Rate in 60s May need to taper back metoprolol if bp drops with hd / hold am of hd. May resume coumadin OP per primary but would prefer to wait at least 2 weeks to make certain Hb stable.. 9. Dispo - Discharge today per primary    Scot Jun. Thad Ranger [facility] [phone] pager 03/31/2012,2:03 PM  LOS: 3 days  I have seen and examined this patient and agree with  plan as outlined in the above note with highlighted additions. Jaiana Sheffer B,MD 03/31/2012 4:32 PM   Additional Objective Labs: Basic Metabolic Panel:  Lab 03/31/12 2841 03/30/12 0545 03/28/12 1356  NA 136 138 134*  K 3.0* 3.1* 3.5  CL 94* 96 92*  CO2 25 29 31   GLUCOSE 123* 143* 230*  BUN 22 14 15   CREATININE 4.98* 3.36* 3.98*  CALCIUM 9.3 9.3 9.2  ALB -- -- --  PHOS -- -- --   CBC:  Lab 03/31/12 0615 03/30/12 0545 03/29/12 2006 03/29/12 1215 03/29/12 0728 03/28/12 1356  WBC 7.7 8.7 11.3* -- -- --  NEUTROABS -- -- -- -- -- 5.3  HGB 9.2* 9.5* 10.2* -- -- --  HCT 28.7* 30.3* 31.1* -- -- --  MCV 86.2 87.1 85.4 86.5 87.387.9 --  PLT 221 207 199 -- -- --   Blood Culture    Component Value  Date/Time   SDES BLOOD ARM LEFT 03/30/2012 1027   SPECREQUEST BOTTLES DRAWN AEROBIC ONLY 10CC 03/30/2012 1027   CULT  Value: GRAM NEGATIVE RODS Note: Gram Stain Report Called to,Read Back By and Verified With: Guilford Shi RN on 03/31/12 at 04:40 by Christie Nottingham 03/30/2012 1027   REPTSTATUS PENDING 03/30/2012 1027    CBG:  Lab 03/31/12 0733 03/30/12 2117 03/30/12 1659 03/30/12 1231 03/30/12 0840  GLUCAP 121* 133* 157* 180* 124*   Studies/Results: Ir Shuntogram/ Fistulagram Left Mod Sed  03/31/2012  *RADIOLOGY REPORT*  Indication: Decreased flows at dialysis  DIALYSIS AV SHUNTOGRAM/FISTULAGRAM  Comparison: None - this is the first shuntogram of this newly created left thigh HERO graft.  Intravenous medications: None  Contrast: 85 ml Omnipaque-300  Fluoroscopy Time: 0.5 minutes  Complications: None immediate  Procedure:  The skin overlying the left thigh HERO graft was prepped with Betadine in a sterile fashion, and a sterile drape was applied covering the operative field.  A diagnostic shunt study was performed via an 18 gauge angiocatheter introduced into venous outflow.  Venous drainage was assessed to the level of the central veins in the chest.  Despite multiple attempts, a reflux shuntogram was unable to be performed secondary to brisk flow through the graft and inability to occlude the venous limb.  The angiocath was removed and hemostasis was achieved with manual compression.  A dressing was placed.  The patient tolerated the procedure well without immediate post procedural complication.  Findings:  The left thigh HERO graft is widely patent.  There is very focal minimal irregularity within the central aspect of the venous limb, not resulting in hemodynamically significant stenosis.  The remainder of the HERO graft is widely patent.  The HERO a coupling device as well as the central venous catheter component of the graft is widely patent to the level of the intrahepatic IVC.  As above, despite  multiple attempts, a reflux shuntogram was unable to be performed secondary to the brisk flow through the graft and inability to occlude the venous limb. Given the brisk flow through the graft, the presence of a hemodynamically significant anastomotic or arterial limb stenosis is felt to be unlikely.  IMPRESSION:  Normally functioning HERO graft without peripheral or central stenosis.  No intervention performed.  Access Management:  This access remains amenable to future percutaneous interventions as clinically indicated.   Original Report Authenticated By: Tacey Ruiz, MD    Medications:      . calcium acetate  667 mg Oral TID WC  . ciprofloxacin  500 mg Oral Q24H  .  darbepoetin (ARANESP) injection - DIALYSIS  200 mcg Intravenous Q Sat-HD  . docusate sodium  100 mg Oral BID  . DULoxetine  20 mg Oral QAC breakfast  . feeding supplement (NEPRO CARB STEADY)  237 mL Oral BID WC  . insulin aspart  0-5 Units Subcutaneous QHS  . insulin aspart  0-9 Units Subcutaneous TID WC  . metoprolol tartrate  25 mg Oral Custom  . metoprolol tartrate  25 mg Oral Custom  . multivitamin  1 tablet Oral QHS  . pantoprazole  40 mg Oral BID  . paricalcitol  4 mcg Intravenous Q T,Th,Sa-HD  . simvastatin  20 mg Oral QHS

## 2012-03-31 NOTE — Discharge Summary (Signed)
Physician Discharge Summary  Ebony [name] RUE:[id number] DOB: 01-18-1930 DOA: 03/28/2012  PCP: Johny Sax, MD  Admit date: 03/28/2012 Discharge date: 03/31/2012  Time spent: 35 minutes  Recommendations for Outpatient Follow-up:  1. Follow up with PCP.  Discharge Diagnoses:  Active Problems:  Bacteremia  GI bleed  Supratherapeutic INR  ESRD (end stage renal disease) on dialysis  CVA, old, hemiparesis  Acute posthemorrhagic anemia   Discharge Condition: guarded  Diet recommendation: renal diet  Filed Weights   03/29/12 1842 03/30/12 0407 03/30/12 2115  Weight: 71.5 kg (157 lb 10.1 oz) 71 kg (156 lb 8.4 oz) 72.3 kg (159 lb 6.3 oz)    History of present illness:  79 y.o. female  This is an 79 year old female with a history of end-stage renal disease on dialysis, coronary artery disease, atrial fibrillation, past CVAs, currently on Coumadin, who presents from a nursing facility with a chief complaint of lightheadedness and dizziness for the past few days as well as lower GI bleed. Nursing staff at the nursing facility have noticed that she has bright red blood in her stools. A hemoglobin was checked and returned of value of less than 7. She was transferred to [facility]. Repeat hemoglobin here is 7.8. Her most recent hemoglobin in our system was done in November of 2013 and it was 12. She denies having a GI bleed in the past. She denies having a gastroenterologist however she she states that she does not recall. She denies ever having a colonoscopy or upper endoscopy however she's not sure. She is very emotional stating that she is 79 years old and can't remember these things. She endorses lightheadedness and dizziness. She endorses feeling tired. She denies taking other medications such as NSAIDs except for what is on her medication list. Because of her bleed at the nursing facility her Coumadin has been on hold since January 14.   Hospital Course:  GI bleed  - Etiology  unclear most likely Diverticular bleed. Patient transfused 2 unit of PRBC on 03/28/2012. Patient given 5 mg of oral vitamin K. Patient transfused 2 units of FFP.  - GI bleed presumed to be diverticular in origin, GI not planning on intervention unless bleeding recurs.  - follow up Hbg as an outpatient. - Follow closely BM as an out patient.  Bacteremia  - Blood cultures 2/2 growing gram-negative rods on 03/29/2012. Started on Zosyn  on 03/29/2012.  - Blood cultures showed  E. Coli sensitive to Cipro change on 1.20.2014- 2.1.2014.  - Decub ulcer: WOC as an outpatient wound looks clean.  Acute blood loss anemia due to GI bleed  - Patient transfused 2 unit of PRBC on 03/28/2012, Hbg stable.  - Ferrous sulfate as an outpatient.  Supratherapeutic INR  - Corrected with FFP and vitamin K on 1.17.2014.   Atrial fibrillation  - Rate controlled. Not on anticoagulation given concern for GI bleed.  - Resume coumadin as an outpatient.   End-stage renal disease  Hemodialysis as per renal service.   Diabetes  Sliding scale insulin.   Hyperlipidemia  Statin.   Hypertension  Stable.   Depression  Continue Cymbalta.   Procedures:  None  Consultations:  renal  Discharge Exam: Filed Vitals:   03/30/12 1100 03/30/12 1713 03/30/12 2115 03/31/12 0532  BP: 123/30 129/58 135/34 120/55  Pulse: 63 63 63 77  Temp: 99.2 F (37.3 C) 98.9 F (37.2 C) 100.4 F (38 C) 98.6 F (37 C)  TempSrc: Oral Oral Oral Oral  Resp: 21  18 17 18   Height:      Weight:   72.3 kg (159 lb 6.3 oz)   SpO2: 91% 96% 92% 91%    See progress note. Discharge Instructions  Discharge Orders    Future Orders Please Complete By Expires   Diet - low sodium heart healthy      Increase activity slowly          Medication List     As of 03/31/2012 10:02 AM    TAKE these medications         acetaminophen 325 MG tablet   Commonly known as: TYLENOL   Take 325-650 mg by mouth every 4 (four) hours as needed. For  headache.      amLODipine 5 MG tablet   Commonly known as: NORVASC   Take 5 mg by mouth at bedtime.      calcium acetate 667 MG capsule   Commonly known as: PHOSLO   Take 667 mg by mouth 3 (three) times daily with meals.      ciprofloxacin 500 MG tablet   Commonly known as: CIPRO   Take 1 tablet (500 mg total) by mouth daily.      COLACE 100 MG capsule   Generic drug: docusate sodium   Take 100 mg by mouth 2 (two) times daily.      darbepoetin 200 MCG/0.4ML Soln   Commonly known as: ARANESP   Inject 200 mcg into the vein every Thursday with hemodialysis. Thursday with dialysis      DULoxetine 20 MG capsule   Commonly known as: CYMBALTA   Take 20 mg by mouth daily before breakfast.      feeding supplement (NEPRO CARB STEADY) Liqd   Take 237 mLs by mouth 2 (two) times daily with a meal.      insulin aspart 100 UNIT/ML injection   Commonly known as: novoLOG   Inject 5 Units into the skin 2 (two) times daily before lunch and supper.      insulin glargine 100 UNIT/ML injection   Commonly known as: LANTUS   Inject 5 Units into the skin at bedtime.      lidocaine-prilocaine cream   Commonly known as: EMLA   Apply 1 application topically Every Tuesday,Thursday,and Saturday with dialysis.      metoprolol tartrate 25 MG tablet   Commonly known as: LOPRESSOR   Take 25 mg by mouth See admin instructions. Twice a day on Sunday, Monday, Wednesday, Friday; once daily after dialysis on Tuesday, Thursday, Saturday      multivitamin Tabs tablet   Take 1 tablet by mouth daily.      omeprazole 20 MG capsule   Commonly known as: PRILOSEC   Take 40 mg by mouth daily.      oxyCODONE-acetaminophen 5-325 MG per tablet   Commonly known as: PERCOCET/ROXICET   Take 1-2 tablets by mouth every 4 (four) hours as needed. For pain.  Give 1 tablet for pain scale 1-5 and give 2 tablets for pain scale 6-10.      paricalcitol 2 MCG/ML injection   Commonly known as: ZEMPLAR   Inject 1 mcg into  the vein 3 (three) times a week. Inject during dialysis.      REMEDY CALAZIME EX   Apply 1 application topically daily. To sacrum      simvastatin 20 MG tablet   Commonly known as: ZOCOR   Take 20 mg by mouth at bedtime.      warfarin 5 MG tablet  Commonly known as: COUMADIN   Take 5 mg by mouth daily.           Follow-up Information    Follow up with Johny Sax, MD. (hospital follow up)    Contact information:   [address]  [address] [place] [address] [phone]           The results of significant diagnostics from this hospitalization (including imaging, microbiology, ancillary and laboratory) are listed below for reference.    Significant Diagnostic Studies: Dg Chest Port 1 View  03/29/2012  *RADIOLOGY REPORT*  Clinical Data: 79 year old female with fever, shortness of breath and cough.  PORTABLE CHEST - 1 VIEW  Comparison: 01/16/2012 and prior chest radiographs  Findings: Upper limits normal heart size again noted. Left basilar scarring is present. There is no evidence of focal airspace disease, pulmonary edema, suspicious pulmonary nodule/mass, pleural effusion, or pneumothorax. No acute bony abnormalities are identified.  IMPRESSION: No evidence of acute cardiopulmonary disease.   Original Report Authenticated By: Harmon Pier, M.D.    Ir Shuntogram/ Fistulagram Left Mod Sed  03/31/2012  *RADIOLOGY REPORT*  Indication: Decreased flows at dialysis  DIALYSIS AV SHUNTOGRAM/FISTULAGRAM  Comparison: None - this is the first shuntogram of this newly created left thigh HERO graft.  Intravenous medications: None  Contrast: 85 ml Omnipaque-300  Fluoroscopy Time: 0.5 minutes  Complications: None immediate  Procedure:  The skin overlying the left thigh HERO graft was prepped with Betadine in a sterile fashion, and a sterile drape was applied covering the operative field.  A diagnostic shunt study was performed via an 18 gauge angiocatheter  introduced into venous outflow.  Venous drainage was assessed to the level of the central veins in the chest.  Despite multiple attempts, a reflux shuntogram was unable to be performed secondary to brisk flow through the graft and inability to occlude the venous limb.  The angiocath was removed and hemostasis was achieved with manual compression.  A dressing was placed.  The patient tolerated the procedure well without immediate post procedural complication.  Findings:  The left thigh HERO graft is widely patent.  There is very focal minimal irregularity within the central aspect of the venous limb, not resulting in hemodynamically significant stenosis.  The remainder of the HERO graft is widely patent.  The HERO a coupling device as well as the central venous catheter component of the graft is widely patent to the level of the intrahepatic IVC.  As above, despite multiple attempts, a reflux shuntogram was unable to be performed secondary to the brisk flow through the graft and inability to occlude the venous limb. Given the brisk flow through the graft, the presence of a hemodynamically significant anastomotic or arterial limb stenosis is felt to be unlikely.  IMPRESSION:  Normally functioning HERO graft without peripheral or central stenosis.  No intervention performed.  Access Management:  This access remains amenable to future percutaneous interventions as clinically indicated.   Original Report Authenticated By: Tacey Ruiz, MD     Microbiology: Recent Results (from the past 240 hour(s))  CULTURE, BLOOD (ROUTINE X 2)     Status: Normal   Collection Time   03/29/12 12:01 AM      Component Value Range Status Comment   Specimen Description BLOOD LEFT ARM   Final    Special Requests BOTTLES DRAWN AEROBIC ONLY 5CC   Final    Culture  Setup Time 03/29/2012 03:12   Final  Culture     Final    Value: ESCHERICHIA COLI     Note: SUSCEPTIBILITIES PERFORMED ON PREVIOUS CULTURE WITHIN THE LAST 5 DAYS.      Note: Gram Stain Report Called to,Read Back By and Verified With: AL WILLIAMS 03/29/12 @ 2:31PM BY RUSCA.   Report Status 03/31/2012 FINAL   Final   MRSA PCR SCREENING     Status: Normal   Collection Time   03/29/12 12:01 AM      Component Value Range Status Comment   MRSA by PCR NEGATIVE  NEGATIVE Final   CULTURE, BLOOD (ROUTINE X 2)     Status: Normal   Collection Time   03/29/12 12:10 AM      Component Value Range Status Comment   Specimen Description BLOOD LEFT HAND   Final    Special Requests BOTTLES DRAWN AEROBIC ONLY 5CC   Final    Culture  Setup Time 03/29/2012 03:11   Final    Culture     Final    Value: ESCHERICHIA COLI     Note: Gram Stain Report Called to,Read Back By and Verified With: AL WILLIAMS 03/29/12 @ 2:31PM BY RUSCA.   Report Status 03/31/2012 FINAL   Final    Organism ID, Bacteria ESCHERICHIA COLI   Final   CULTURE, BLOOD (ROUTINE X 2)     Status: Normal (Preliminary result)   Collection Time   03/30/12 10:17 AM      Component Value Range Status Comment   Specimen Description BLOOD HAND RIGHT   Final    Special Requests BOTTLES DRAWN AEROBIC ONLY 5.0CC   Final    Culture  Setup Time 03/30/2012 18:57   Final    Culture     Final    Value: GRAM NEGATIVE RODS     Note: Gram Stain Report Called to,Read Back By and Verified With: Guilford Shi RN on 03/31/12 at 04:40 by Christie Nottingham   Report Status PENDING   Incomplete   CULTURE, BLOOD (ROUTINE X 2)     Status: Normal (Preliminary result)   Collection Time   03/30/12 10:27 AM      Component Value Range Status Comment   Specimen Description BLOOD ARM LEFT   Final    Special Requests BOTTLES DRAWN AEROBIC ONLY 10CC   Final    Culture  Setup Time 03/30/2012 18:56   Final    Culture     Final    Value: GRAM NEGATIVE RODS     Note: Gram Stain Report Called to,Read Back By and Verified With: Guilford Shi RN on 03/31/12 at 04:40 by Christie Nottingham   Report Status PENDING   Incomplete      Labs: Basic Metabolic  Panel:  Lab 03/31/12 0615 03/30/12 0545 03/28/12 1356  NA 136 138 134*  K 3.0* 3.1* 3.5  CL 94* 96 92*  CO2 25 29 31   GLUCOSE 123* 143* 230*  BUN 22 14 15   CREATININE 4.98* 3.36* 3.98*  CALCIUM 9.3 9.3 9.2  MG -- -- --  PHOS -- -- --   Liver Function Tests: No results found for this basename: AST:5,ALT:5,ALKPHOS:5,BILITOT:5,PROT:5,ALBUMIN:5 in the last 168 hours No results found for this basename: LIPASE:5,AMYLASE:5 in the last 168 hours No results found for this basename: AMMONIA:5 in the last 168 hours CBC:  Lab 03/31/12 0615 03/30/12 0545 03/29/12 2006 03/29/12 1215 03/29/12 0728 03/28/12 1356  WBC 7.7 8.7 11.3* 7.3 6.76.6 --  NEUTROABS -- -- -- -- -- 5.3  HGB  9.2* 9.5* 10.2* 7.8* 8.8*8.8* --  HCT 28.7* 30.3* 31.1* 24.4* 27.6*28.2* --  MCV 86.2 87.1 85.4 86.5 87.387.9 --  PLT 221 207 199 191 200198 --   Cardiac Enzymes: No results found for this basename: CKTOTAL:5,CKMB:5,CKMBINDEX:5,TROPONINI:5 in the last 168 hours BNP: BNP (last 3 results) No results found for this basename: PROBNP:3 in the last 8760 hours CBG:  Lab 03/31/12 0733 03/30/12 2117 03/30/12 1659 03/30/12 1231 03/30/12 0840  GLUCAP 121* 133* 157* 180* 124*   Signed:  FELIZ ORTIZ, Walburga Hudman  [facility] 03/31/2012, 10:02 AM

## 2012-03-31 NOTE — Progress Notes (Signed)
[facility] PROGRESS NOTE   Assessment/Plan: GI bleed  - Etiology unclear most likely Diverticular bleed. Patient transfused 2 unit of PRBC on 03/28/2012. Patient given 5 mg of oral vitamin K. Patient transfused 2 units of FFP.  - GI bleed presumed to be diverticular in origin, GI not planning on intervention unless bleeding recurs.   Bacteremia  - Blood cultures 2/2 growing gram-negative rods on 03/29/2012. Started Zosyn per pharmacy on 03/29/2012.  - U/a showed E. Coli sensitive to Cipro change on 1.20.2014- 2.1.2014.  Acute blood loss anemia due to GI bleed  - Patient transfused 2 unit of PRBC on 03/28/2012, Hbg stable.  - Ferrous sulfate per Renee Ramus.  Supratherapeutic INR  - Corrected with FFP and vitamin K on 1.17.2014.   Atrial fibrillation  - Rate controlled. Not on anticoagulation given concern for GI bleed.  - Resume coumadin as an outpatient.  End-stage renal disease  Hemodialysis as per renal service.   Diabetes  Sliding scale insulin.   Hyperlipidemia  Statin.   Hypertension  Stable.   Depression  Continue Cymbalta.  Code Status: Full code  Family Communication: No family at bedside  Disposition Plan: Pending. Transfer to telemetry.    Consultants:  Renal   Procedures:  none  Antibiotics:  Zosyn 1.18.2014- 1.20.2014  cipro 1.20.2014- 2.12014  HPI/Subjective: No complains feels better.  Objective: Filed Vitals:   03/30/12 1100 03/30/12 1713 03/30/12 2115 03/31/12 0532  BP: 123/30 129/58 135/34 120/55  Pulse: 63 63 63 77  Temp: 99.2 F (37.3 C) 98.9 F (37.2 C) 100.4 F (38 C) 98.6 F (37 C)  TempSrc: Oral Oral Oral Oral  Resp: 21 18 17 18   Height:      Weight:   72.3 kg (159 lb 6.3 oz)   SpO2: 91% 96% 92% 91%    Intake/Output Summary (Last 24 hours) at 03/31/12 0948 Last data filed at 03/31/12 0639  Gross per 24 hour  Intake    570 ml  Output      0 ml  Net    570 ml   Filed Weights   03/29/12 1842 03/30/12 0407  03/30/12 2115  Weight: 71.5 kg (157 lb 10.1 oz) 71 kg (156 lb 8.4 oz) 72.3 kg (159 lb 6.3 oz)    Exam:  General: Alert, awake, oriented x3, in no acute distress.  HEENT: No bruits, no goiter.  Heart: Regular rate and rhythm, without murmurs, rubs, gallops.  Lungs: Good air movement, clear to auscultation. Abdomen: Soft, nontender, nondistended, positive bowel sounds.  Neuro: Grossly intact, nonfocal.   Data Reviewed: Basic Metabolic Panel:  Lab 03/31/12 1610 03/30/12 0545 03/28/12 1356  NA 136 138 134*  K 3.0* 3.1* 3.5  CL 94* 96 92*  CO2 25 29 31   GLUCOSE 123* 143* 230*  BUN 22 14 15   CREATININE 4.98* 3.36* 3.98*  CALCIUM 9.3 9.3 9.2  MG -- -- --  PHOS -- -- --   Liver Function Tests: No results found for this basename: AST:5,ALT:5,ALKPHOS:5,BILITOT:5,PROT:5,ALBUMIN:5 in the last 168 hours No results found for this basename: LIPASE:5,AMYLASE:5 in the last 168 hours No results found for this basename: AMMONIA:5 in the last 168 hours CBC:  Lab 03/31/12 0615 03/30/12 0545 03/29/12 2006 03/29/12 1215 03/29/12 0728 03/28/12 1356  WBC 7.7 8.7 11.3* 7.3 6.76.6 --  NEUTROABS -- -- -- -- -- 5.3  HGB 9.2* 9.5* 10.2* 7.8* 8.8*8.8* --  HCT 28.7* 30.3* 31.1* 24.4* 27.6*28.2* --  MCV 86.2 87.1 85.4 86.5 87.387.9 --  PLT 221 207 199 191 200198 --   Cardiac Enzymes: No results found for this basename: CKTOTAL:5,CKMB:5,CKMBINDEX:5,TROPONINI:5 in the last 168 hours BNP (last 3 results) No results found for this basename: PROBNP:3 in the last 8760 hours CBG:  Lab 03/31/12 0733 03/30/12 2117 03/30/12 1659 03/30/12 1231 03/30/12 0840  GLUCAP 121* 133* 157* 180* 124*    Recent Results (from the past 240 hour(s))  CULTURE, BLOOD (ROUTINE X 2)     Status: Normal   Collection Time   03/29/12 12:01 AM      Component Value Range Status Comment   Specimen Description BLOOD LEFT ARM   Final    Special Requests BOTTLES DRAWN AEROBIC ONLY 5CC   Final    Culture  Setup Time  03/29/2012 03:12   Final    Culture     Final    Value: ESCHERICHIA COLI     Note: SUSCEPTIBILITIES PERFORMED ON PREVIOUS CULTURE WITHIN THE LAST 5 DAYS.     Note: Gram Stain Report Called to,Read Back By and Verified With: AL WILLIAMS 03/29/12 @ 2:31PM BY RUSCA.   Report Status 03/31/2012 FINAL   Final   MRSA PCR SCREENING     Status: Normal   Collection Time   03/29/12 12:01 AM      Component Value Range Status Comment   MRSA by PCR NEGATIVE  NEGATIVE Final   CULTURE, BLOOD (ROUTINE X 2)     Status: Normal   Collection Time   03/29/12 12:10 AM      Component Value Range Status Comment   Specimen Description BLOOD LEFT HAND   Final    Special Requests BOTTLES DRAWN AEROBIC ONLY 5CC   Final    Culture  Setup Time 03/29/2012 03:11   Final    Culture     Final    Value: ESCHERICHIA COLI     Note: Gram Stain Report Called to,Read Back By and Verified With: AL WILLIAMS 03/29/12 @ 2:31PM BY RUSCA.   Report Status 03/31/2012 FINAL   Final    Organism ID, Bacteria ESCHERICHIA COLI   Final   CULTURE, BLOOD (ROUTINE X 2)     Status: Normal (Preliminary result)   Collection Time   03/30/12 10:17 AM      Component Value Range Status Comment   Specimen Description BLOOD HAND RIGHT   Final    Special Requests BOTTLES DRAWN AEROBIC ONLY 5.0CC   Final    Culture  Setup Time 03/30/2012 18:57   Final    Culture     Final    Value: GRAM NEGATIVE RODS     Note: Gram Stain Report Called to,Read Back By and Verified With: Guilford Shi RN on 03/31/12 at 04:40 by Christie Nottingham   Report Status PENDING   Incomplete   CULTURE, BLOOD (ROUTINE X 2)     Status: Normal (Preliminary result)   Collection Time   03/30/12 10:27 AM      Component Value Range Status Comment   Specimen Description BLOOD ARM LEFT   Final    Special Requests BOTTLES DRAWN AEROBIC ONLY 10CC   Final    Culture  Setup Time 03/30/2012 18:56   Final    Culture     Final    Value: GRAM NEGATIVE RODS     Note: Gram Stain Report Called to,Read  Back By and Verified With: Guilford Shi RN on 03/31/12 at 04:40 by Christie Nottingham   Report Status PENDING   Incomplete  Studies: Ir Shuntogram/ Fistulagram Left Mod Sed  03/31/2012  *RADIOLOGY REPORT*  Indication: Decreased flows at dialysis  DIALYSIS AV SHUNTOGRAM/FISTULAGRAM  Comparison: None - this is the first shuntogram of this newly created left thigh HERO graft.  Intravenous medications: None  Contrast: 85 ml Omnipaque-300  Fluoroscopy Time: 0.5 minutes  Complications: None immediate  Procedure:  The skin overlying the left thigh HERO graft was prepped with Betadine in a sterile fashion, and a sterile drape was applied covering the operative field.  A diagnostic shunt study was performed via an 18 gauge angiocatheter introduced into venous outflow.  Venous drainage was assessed to the level of the central veins in the chest.  Despite multiple attempts, a reflux shuntogram was unable to be performed secondary to brisk flow through the graft and inability to occlude the venous limb.  The angiocath was removed and hemostasis was achieved with manual compression.  A dressing was placed.  The patient tolerated the procedure well without immediate post procedural complication.  Findings:  The left thigh HERO graft is widely patent.  There is very focal minimal irregularity within the central aspect of the venous limb, not resulting in hemodynamically significant stenosis.  The remainder of the HERO graft is widely patent.  The HERO a coupling device as well as the central venous catheter component of the graft is widely patent to the level of the intrahepatic IVC.  As above, despite multiple attempts, a reflux shuntogram was unable to be performed secondary to the brisk flow through the graft and inability to occlude the venous limb. Given the brisk flow through the graft, the presence of a hemodynamically significant anastomotic or arterial limb stenosis is felt to be unlikely.  IMPRESSION:  Normally  functioning HERO graft without peripheral or central stenosis.  No intervention performed.  Access Management:  This access remains amenable to future percutaneous interventions as clinically indicated.   Original Report Authenticated By: Tacey Ruiz, MD     Scheduled Meds:   . calcium acetate  667 mg Oral TID WC  . darbepoetin (ARANESP) injection - DIALYSIS  200 mcg Intravenous Q Sat-HD  . docusate sodium  100 mg Oral BID  . DULoxetine  20 mg Oral QAC breakfast  . feeding supplement (NEPRO CARB STEADY)  237 mL Oral BID WC  . insulin aspart  0-5 Units Subcutaneous QHS  . insulin aspart  0-9 Units Subcutaneous TID WC  . metoprolol tartrate  25 mg Oral Custom  . metoprolol tartrate  25 mg Oral Custom  . multivitamin  1 tablet Oral QHS  . pantoprazole  40 mg Oral BID  . paricalcitol  4 mcg Intravenous Q T,Th,Sa-HD  . piperacillin-tazobactam (ZOSYN)  IV  2.25 g Intravenous Q8H  . simvastatin  20 mg Oral QHS   Continuous Infusions:    Marinda Elk  [facility] Pager [phone]. If 8PM-8AM, please contact night-coverage at [email or web address], password [facility] 03/31/2012, 9:48 AM  LOS: 3 days

## 2012-04-01 ENCOUNTER — Telehealth (HOSPITAL_COMMUNITY): Payer: Self-pay | Admitting: *Deleted

## 2012-04-01 LAB — CULTURE, BLOOD (ROUTINE X 2)

## 2012-04-01 LAB — GLUCOSE, CAPILLARY: Glucose-Capillary: 238 mg/dL — ABNORMAL HIGH (ref 70–99)

## 2012-04-10 NOTE — Clinical Social Work Psychosocial (Addendum)
Clinical Social Work Department BRIEF PSYCHOSOCIAL ASSESSMENT 04/10/2012  Patient:  Ebony [name], Ebony [name]     Account Number:  [id number]     Admit date:  03/28/2012  Clinical Social Worker:  Delmer Islam  Date/Time:  03/31/2012 08:14 AM  Referred by:  Physician  Date Referred:  03/31/2012 Referred for  SNF Placement   Other Referral:   Interview type:  Other - See comment Other interview type:   CSW made contact with skilled facility    PSYCHOSOCIAL DATA Living Status:  FACILITY Admitted from facility:  [facility], [place] Level of care:  Skilled Nursing Facility Primary support name:  Lajoyce Corners Primary support relationship to patient:  CHILD, ADULT Degree of support available:   Unknown    CURRENT CONCERNS Current Concerns  Post-Acute Placement   Other Concerns:    SOCIAL WORK ASSESSMENT / PLAN Patient is a resident at [facility] and per Edwin Cap, RN Liasion on 03/31/12 can return when medically ready.   Assessment/plan status:  No Further Intervention Required Other assessment/ plan:   Information/referral to community resources:    PATIENT'S/FAMILY'S RESPONSE TO PLAN OF CARE: Per facility patient can return. Patient discharged back to SNF on 03/31/12 via ambulance.

## 2012-05-27 ENCOUNTER — Non-Acute Institutional Stay (SKILLED_NURSING_FACILITY): Payer: BC Managed Care – PPO | Admitting: Internal Medicine

## 2012-05-27 ENCOUNTER — Encounter: Payer: Self-pay | Admitting: Internal Medicine

## 2012-05-27 DIAGNOSIS — R410 Disorientation, unspecified: Secondary | ICD-10-CM

## 2012-05-27 DIAGNOSIS — R509 Fever, unspecified: Secondary | ICD-10-CM

## 2012-05-27 DIAGNOSIS — R404 Transient alteration of awareness: Secondary | ICD-10-CM

## 2012-05-27 NOTE — Progress Notes (Signed)
I was asked to see Ebony [name] today due to lethargy.  She had just returned from HD where she was also noted to be somnolent per one of the nurses who spoke with HD staff.  She said she felt bad when asked, but had not specific complaints.  Review of her VS just taken revealed fever and tachycardia.  She had chills and was wearing a sweatshirt with the heat turned up very high in her room.    ROS:   Gen:  Admits to fatigue HEENT:  Denies headache, sore throat, nasal congestion CV: no chest pain, palpitations;  HD access site in left thigh with bruit and thrill, no erythema, increased warmth or drainage visible Pulm:  Has cough, denies sob Abd:  Denies abd pain Ext:  No edema  PE:  VS charted with fever and tachycardia HEENT:  Ncat, perrla, pharnyx clear Lymph nodes:  No cervical or supraclavicular lymphadenopathy CV:  Tachy, no audible m/g/r Pulm:  Few rhonchi, poor effort Abd:  Soft, nontender, nondistended with normal bs Ext:  No edema, no new weakness  A/P:  1.  Fever, tachycardia, hypoactive delirium:  Suspect new infection, but source not clear.  Has history of HD access-related complications, but none visible at present.  Suspect bronchitis or pneumonia due to her cough, congestion staff noted earlier today and few rhonchi.    Obtain cbc, bmp, blood cultures x 2 tonight, pCXR to evaluate.  Makes only very minimal urine due to ESRD on HD so no urine specimen will be obtained.   Begin avelox 400mg  po daily for 7 days until results return.  Should she decline despite these interventions, she will be sent back to the hospital.

## 2012-05-28 ENCOUNTER — Encounter (HOSPITAL_COMMUNITY): Payer: Self-pay | Admitting: Emergency Medicine

## 2012-05-28 ENCOUNTER — Emergency Department (HOSPITAL_COMMUNITY): Payer: Medicare Other

## 2012-05-28 ENCOUNTER — Inpatient Hospital Stay (HOSPITAL_COMMUNITY)
Admission: EM | Admit: 2012-05-28 | Discharge: 2012-06-03 | DRG: 871 | Disposition: A | Discharge status: Skilled Nursing Facility | Payer: Medicare Other | Attending: Internal Medicine | Admitting: Internal Medicine

## 2012-05-28 ENCOUNTER — Non-Acute Institutional Stay (SKILLED_NURSING_FACILITY): Payer: Medicare Other | Admitting: Adult Health

## 2012-05-28 ENCOUNTER — Encounter: Payer: Self-pay | Admitting: Adult Health

## 2012-05-28 DIAGNOSIS — R7881 Bacteremia: Secondary | ICD-10-CM

## 2012-05-28 DIAGNOSIS — F068 Other specified mental disorders due to known physiological condition: Secondary | ICD-10-CM

## 2012-05-28 DIAGNOSIS — I429 Cardiomyopathy, unspecified: Secondary | ICD-10-CM

## 2012-05-28 DIAGNOSIS — E119 Type 2 diabetes mellitus without complications: Secondary | ICD-10-CM

## 2012-05-28 DIAGNOSIS — E785 Hyperlipidemia, unspecified: Secondary | ICD-10-CM

## 2012-05-28 DIAGNOSIS — I10 Essential (primary) hypertension: Secondary | ICD-10-CM

## 2012-05-28 DIAGNOSIS — K922 Gastrointestinal hemorrhage, unspecified: Secondary | ICD-10-CM

## 2012-05-28 DIAGNOSIS — R791 Abnormal coagulation profile: Secondary | ICD-10-CM

## 2012-05-28 DIAGNOSIS — A409 Streptococcal sepsis, unspecified: Principal | ICD-10-CM | POA: Diagnosis present

## 2012-05-28 DIAGNOSIS — D72829 Elevated white blood cell count, unspecified: Secondary | ICD-10-CM | POA: Diagnosis present

## 2012-05-28 DIAGNOSIS — K5900 Constipation, unspecified: Secondary | ICD-10-CM

## 2012-05-28 DIAGNOSIS — R531 Weakness: Secondary | ICD-10-CM

## 2012-05-28 DIAGNOSIS — R05 Cough: Secondary | ICD-10-CM

## 2012-05-28 DIAGNOSIS — I69998 Other sequelae following unspecified cerebrovascular disease: Secondary | ICD-10-CM

## 2012-05-28 DIAGNOSIS — N186 End stage renal disease: Secondary | ICD-10-CM

## 2012-05-28 DIAGNOSIS — M2459 Contracture, other specified joint: Secondary | ICD-10-CM | POA: Diagnosis present

## 2012-05-28 DIAGNOSIS — M25539 Pain in unspecified wrist: Secondary | ICD-10-CM | POA: Diagnosis present

## 2012-05-28 DIAGNOSIS — A419 Sepsis, unspecified organism: Secondary | ICD-10-CM

## 2012-05-28 DIAGNOSIS — N2581 Secondary hyperparathyroidism of renal origin: Secondary | ICD-10-CM | POA: Diagnosis present

## 2012-05-28 DIAGNOSIS — F32A Depression, unspecified: Secondary | ICD-10-CM

## 2012-05-28 DIAGNOSIS — Z8614 Personal history of Methicillin resistant Staphylococcus aureus infection: Secondary | ICD-10-CM

## 2012-05-28 DIAGNOSIS — Z7901 Long term (current) use of anticoagulants: Secondary | ICD-10-CM

## 2012-05-28 DIAGNOSIS — I4891 Unspecified atrial fibrillation: Secondary | ICD-10-CM

## 2012-05-28 DIAGNOSIS — F329 Major depressive disorder, single episode, unspecified: Secondary | ICD-10-CM | POA: Diagnosis present

## 2012-05-28 DIAGNOSIS — Z992 Dependence on renal dialysis: Secondary | ICD-10-CM

## 2012-05-28 DIAGNOSIS — I6340 Cerebral infarction due to embolism of unspecified cerebral artery: Secondary | ICD-10-CM

## 2012-05-28 DIAGNOSIS — I2510 Atherosclerotic heart disease of native coronary artery without angina pectoris: Secondary | ICD-10-CM | POA: Diagnosis present

## 2012-05-28 DIAGNOSIS — Z79899 Other long term (current) drug therapy: Secondary | ICD-10-CM

## 2012-05-28 DIAGNOSIS — L89309 Pressure ulcer of unspecified buttock, unspecified stage: Secondary | ICD-10-CM | POA: Diagnosis present

## 2012-05-28 DIAGNOSIS — F3289 Other specified depressive episodes: Secondary | ICD-10-CM

## 2012-05-28 DIAGNOSIS — K219 Gastro-esophageal reflux disease without esophagitis: Secondary | ICD-10-CM | POA: Diagnosis present

## 2012-05-28 DIAGNOSIS — R059 Cough, unspecified: Secondary | ICD-10-CM

## 2012-05-28 DIAGNOSIS — L0390 Cellulitis, unspecified: Secondary | ICD-10-CM

## 2012-05-28 DIAGNOSIS — D72819 Decreased white blood cell count, unspecified: Secondary | ICD-10-CM

## 2012-05-28 DIAGNOSIS — E211 Secondary hyperparathyroidism, not elsewhere classified: Secondary | ICD-10-CM

## 2012-05-28 DIAGNOSIS — I120 Hypertensive chronic kidney disease with stage 5 chronic kidney disease or end stage renal disease: Secondary | ICD-10-CM | POA: Diagnosis present

## 2012-05-28 DIAGNOSIS — I69959 Hemiplegia and hemiparesis following unspecified cerebrovascular disease affecting unspecified side: Secondary | ICD-10-CM

## 2012-05-28 DIAGNOSIS — L8990 Pressure ulcer of unspecified site, unspecified stage: Secondary | ICD-10-CM | POA: Diagnosis present

## 2012-05-28 DIAGNOSIS — Z794 Long term (current) use of insulin: Secondary | ICD-10-CM

## 2012-05-28 DIAGNOSIS — D638 Anemia in other chronic diseases classified elsewhere: Secondary | ICD-10-CM

## 2012-05-28 DIAGNOSIS — Z87891 Personal history of nicotine dependence: Secondary | ICD-10-CM

## 2012-05-28 DIAGNOSIS — Z880 Allergy status to penicillin: Secondary | ICD-10-CM

## 2012-05-28 DIAGNOSIS — F0390 Unspecified dementia without behavioral disturbance: Secondary | ICD-10-CM

## 2012-05-28 HISTORY — DX: Dependence on renal dialysis: Z99.2

## 2012-05-28 HISTORY — DX: End stage renal disease: N18.6

## 2012-05-28 LAB — CBC WITH DIFFERENTIAL/PLATELET
Basophils Relative: 0 % (ref 0–1)
HCT: 32.4 % — ABNORMAL LOW (ref 36.0–46.0)
Hemoglobin: 10.3 g/dL — ABNORMAL LOW (ref 12.0–15.0)
MCHC: 31.8 g/dL (ref 30.0–36.0)
Monocytes Absolute: 0.8 10*3/uL (ref 0.1–1.0)
Monocytes Relative: 5 % (ref 3–12)
Neutro Abs: 14.7 10*3/uL — ABNORMAL HIGH (ref 1.7–7.7)

## 2012-05-28 LAB — COMPREHENSIVE METABOLIC PANEL
Albumin: 2.9 g/dL — ABNORMAL LOW (ref 3.5–5.2)
BUN: 37 mg/dL — ABNORMAL HIGH (ref 6–23)
CO2: 32 mEq/L (ref 19–32)
Chloride: 92 mEq/L — ABNORMAL LOW (ref 96–112)
Creatinine, Ser: 4.9 mg/dL — ABNORMAL HIGH (ref 0.50–1.10)
GFR calc Af Amer: 9 mL/min — ABNORMAL LOW (ref >90)
GFR calc non Af Amer: 8 mL/min — ABNORMAL LOW (ref >90)
Glucose, Bld: 189 mg/dL — ABNORMAL HIGH (ref 70–99)
Total Bilirubin: 0.5 mg/dL (ref 0.3–1.2)

## 2012-05-28 LAB — PROTIME-INR
INR: 4.3 — ABNORMAL HIGH (ref 0.00–1.49)
Prothrombin Time: 38.6 seconds — ABNORMAL HIGH (ref 11.6–15.2)

## 2012-05-28 MED ORDER — VANCOMYCIN HCL IN DEXTROSE 1-5 GM/200ML-% IV SOLN
1000.0000 mg | Freq: Once | INTRAVENOUS | Status: AC
  Administered 2012-05-28: 1000 mg via INTRAVENOUS
  Filled 2012-05-28: qty 200

## 2012-05-28 MED ORDER — LEVOFLOXACIN IN D5W 750 MG/150ML IV SOLN
750.0000 mg | INTRAVENOUS | Status: DC
  Administered 2012-05-28: 750 mg via INTRAVENOUS
  Filled 2012-05-28: qty 150

## 2012-05-28 NOTE — Assessment & Plan Note (Signed)
She is presently stable is taking lantus 10 units daily takes novolog 5 units prior to meals for cbg >=150

## 2012-05-28 NOTE — Assessment & Plan Note (Signed)
There is no change in her status her shunt is in her left thigh; is followed by nephrology and is on hemodialysis three days per week. Is taking renavite daily

## 2012-05-28 NOTE — ED Notes (Signed)
Hospitalist at bedside at this time 

## 2012-05-28 NOTE — ED Notes (Signed)
Pt complains of left arm pain. Pt brought in my medic for "possible pneumonia" Pt denies cough or chest pain at this time.

## 2012-05-28 NOTE — Progress Notes (Signed)
Clinical Social Work Department BRIEF PSYCHOSOCIAL ASSESSMENT 05/28/2012  Patient:  Ebony [name], Ebony [name]     Account Number:  [id number]     Admit date:  05/28/2012  Clinical Social Worker:  Doree Albee  Date/Time:  05/28/2012 09:45 PM  Referred by:  CSW  Date Referred:  05/28/2012 Referred for  SNF Placement   Other Referral:   Interview type:  Patient Other interview type:   and family    PSYCHOSOCIAL DATA Living Status:  FACILITY Admitted from facility:  [facility] [place] Level of care:  Skilled Nursing Facility Primary support name:  Hermin Arnetha Gula Primary support relationship to patient:  CHILD, ADULT Degree of support available:   pt son    CURRENT CONCERNS Current Concerns  Post-Acute Placement   Other Concerns:    SOCIAL WORK ASSESSMENT / PLAN CSW met with pt at bedise to complete psychosocial assessment. CSW introduced self and csw role. Pt ocnfirmed that she is a resident at [facility] and plans to return when medically stable. Pt has history of dementia but alert and oriented. Pt gave csw permision to speak with pt son Hermin Arnetha Gula.    Pt son confirmed patient is aresident at [facility]. Pt son requested to speak with pt nurse to recieve further medical information.    CSW will complete fl2 and place in pt shadow chart for MD signature when completed.   Assessment/plan status:  Psychosocial Support/Ongoing Assessment of Needs Other assessment/ plan:   Information/referral to community resources:   none identifed at this time    PATIENT'S/FAMILY'S RESPONSE TO PLAN OF CARE: Patient and patient son thanked csw for concern and support. Pt plans to return to [facility] when medically stable.   Catha Gosselin, LCSWA  [phone] .05/28/2012 1036pm

## 2012-05-28 NOTE — Progress Notes (Signed)
CSW confirmed patient is a resident at [facility]. Pt reports her primary support is Hosie Poisson, who is pt son. Pt reports that he travels and is currently in [place] with his job. CSW awaiting further medical evaluation to determine pt disposition needs. Pt plans to return to [facility] when medically stable.   Catha Gosselin, LCSWA  [phone] .05/28/2012 1834pm

## 2012-05-28 NOTE — ED Provider Notes (Signed)
History     CSN: [id number]  Arrival date & time 05/28/12  1612   First MD Initiated Contact with Patient 05/28/12 1626      Chief Complaint  Patient presents with  . Arm Pain    (Consider location/radiation/quality/duration/timing/severity/associated sxs/prior treatment) HPI Ebony [name] is a 79 y.o. female who presents to ED with complaint of fever, cough, altered mental status for 1 day. Hx provided by Rn at [facility]. Pt seemed altered yesterday, more fatigued, slow to respond. Fever 101.8, came down to tylenol. Was seen by Dr. Renato Gails yesterday. Pneumonia on CXR, blood tests and cultures sent. Received 1 dose of avelox yesterday. Today prelim blood cultures came back positive, sent here for bacteremia. Pt is dialysis pt. Last dialyzed yesterday at [facility]. Pt is demented at baseline, but seems more confused than normal. Pt has no complaints. States she is here for arm pain that she has had since she had a stroke.  Past Medical History  Diagnosis Date  . Atrial fibrillation   . CAD (coronary artery disease)   . Hypertension   . Anemia   . MRSA bacteremia     hx  . Cellulitis   . GERD (gastroesophageal reflux disease)   . Acetabulum fracture   . Contracture of elbow     L arm  . Dementia   . Diabetes mellitus     on Insulin  . Decubitus ulcer, buttock 02/07/2011    Pt presents with dressing to R buttocks/coccyx.  . Pneumonia   . Blood transfusion   . Stroke ~ 1990's    Left side paralysis.  Left  foot drop., L arm contracted.  . Chronic kidney disease 03/13/11    dialysis at [facility]; TTS,last tx 03/12/11 hemo x 20years  . Depression   . Hyperlipidemia     Past Surgical History  Procedure Laterality Date  . Arteriovenous graft placement    . Appendectomy    . Cholecystectomy    . Tubal ligation      bilateral  . Abdominal hysterectomy    . Av fistula placement  02/07/2011    Procedure: INSERTION OF ARTERIOVENOUS (AV) GORE-TEX  GRAFT THIGH;  Surgeon: Pryor Ochoa, MD;  Location: [facility] OR;  Service: Vascular;  Laterality: Right;  insertion AVGG right thigh  . Av fistula placement  02/12/2011    Procedure: INSERTION OF ARTERIOVENOUS (AV) GORE-TEX GRAFT THIGH;  Surgeon: Nilda Simmer, MD;  Location: [facility] OR;  Service: Vascular;  Laterality: Right;  . Cataract extraction w/ intraocular lens  implant, bilateral    . Avgg removal  05/24/2011    Procedure: REMOVAL OF ARTERIOVENOUS GORETEX GRAFT (AVGG);  Surgeon: Fransisco Hertz, MD;  Location: [facility] OR;  Service: Vascular;  Laterality: Right;  with bovine patch angioplasty  . Application of wound vac  05/24/2011    Procedure: APPLICATION OF WOUND VAC;  Surgeon: Fransisco Hertz, MD;  Location: [facility] OR;  Service: Vascular;  Laterality: Right;  . Insertion of dialysis catheter  10/03/2011    Procedure: INSERTION OF DIALYSIS CATHETER;  Surgeon: Fransisco Hertz, MD;  Location: [facility] [place] OR;  Service: Vascular;  Laterality: Left;  Insertion left femoral tunneled dialysis catheter  . Removal of a dialysis catheter  01/16/2012    Procedure: REMOVAL OF A DIALYSIS CATHETER;  Surgeon: Fransisco Hertz, MD;  Location: [facility] OR;  Service: Vascular;  Laterality: Left;  . Insertion of dialysis catheter  01/16/2012    Procedure: INSERTION OF  DIALYSIS CATHETER;  Surgeon: Fransisco Hertz, MD;  Location: [facility] OR;  Service: Vascular;  Laterality: Left;    Family History  Problem Relation Age of Onset  . Diabetes Mother   . Hypertension Father   . Anesthesia problems Neg Hx   . Hypotension Neg Hx   . Malignant hyperthermia Neg Hx   . Pseudochol deficiency Neg Hx     History  Substance Use Topics  . Smoking status: Former Smoker -- 0.25 packs/day for 30 years    Types: Cigarettes    Quit date: 03/12/2000  . Smokeless tobacco: Never Used  . Alcohol Use: No     Comment: "last alcohol ~ 1990"    OB History   Grav Para Term Preterm Abortions TAB SAB Ect Mult Living                  Review of Systems  Unable to  perform ROS: Dementia  Constitutional: Positive for fever.  Psychiatric/Behavioral: Positive for confusion.    Allergies  Aspirin; Heparin; Minoxidil; and Penicillins  Home Medications   Current Outpatient Rx  Name  Route  Sig  Dispense  Refill  . acetaminophen (TYLENOL) 325 MG tablet   Oral   Take 325 mg by mouth every 4 (four) hours as needed for pain (headache). For headache.         Marland Kitchen amLODipine (NORVASC) 5 MG tablet   Oral   Take 5 mg by mouth at bedtime.         . docusate sodium (COLACE) 100 MG capsule   Oral   Take 100 mg by mouth 2 (two) times daily.          . DULoxetine (CYMBALTA) 20 MG capsule   Oral   Take 20 mg by mouth daily before breakfast.          . feeding supplement (PRO-STAT SUGAR FREE 64) LIQD   Oral   Take 60 mLs by mouth 2 (two) times daily. Two serving twice daily         . insulin aspart (NOVOLOG) 100 UNIT/ML injection   Subcutaneous   Inject 5 Units into the skin 2 (two) times daily after a meal. For blood glucose > 150         . insulin glargine (LANTUS) 100 UNIT/ML injection   Subcutaneous   Inject 10 Units into the skin at bedtime.          Marland Kitchen ipratropium-albuterol (DUONEB) 0.5-2.5 (3) MG/3ML SOLN   Nebulization   Take 3 mLs by nebulization every 6 (six) hours as needed (for pneumonia).         Marland Kitchen lidocaine-prilocaine (EMLA) cream   Topical   Apply 1 application topically every Monday, Wednesday, and Friday with hemodialysis.          Marland Kitchen metoprolol tartrate (LOPRESSOR) 25 MG tablet   Oral   Take 25-50 mg by mouth as directed. Take 25 mg once daily Tues, Thurs, Sat. Take 25mg  twice daily Sun, Mon, Wed, Fri         . moxifloxacin (AVELOX) 400 MG tablet   Oral   Take 400 mg by mouth every evening.         . multivitamin (RENA-VIT) TABS tablet   Oral   Take 1 tablet by mouth every morning.          Marland Kitchen omeprazole (PRILOSEC) 20 MG capsule   Oral   Take 40 mg by mouth every morning.          Marland Kitchen  ondansetron  (ZOFRAN) 4 MG tablet   Oral   Take 4 mg by mouth every 8 (eight) hours as needed for nausea.         Marland Kitchen oxyCODONE-acetaminophen (PERCOCET/ROXICET) 5-325 MG per tablet   Oral   Take 1-2 tablets by mouth every 4 (four) hours as needed for pain. For pain.  Give 1 tablet for pain scale 1-5 and give 2 tablets for pain scale 6-10.         . simvastatin (ZOCOR) 20 MG tablet   Oral   Take 20 mg by mouth at bedtime.          Marland Kitchen warfarin (COUMADIN) 5 MG tablet   Oral   Take 5 mg by mouth every evening.            BP 115/53  Pulse 71  Temp(Src) 98.3 F (36.8 C) (Oral)  Resp 24  SpO2 100%  Physical Exam  Nursing note and vitals reviewed. Constitutional: She is oriented to person, place, and time. She appears well-developed and well-nourished. No distress.  HENT:  Head: Normocephalic.  Right Ear: External ear normal.  Left Ear: External ear normal.  Mouth/Throat: Oropharynx is clear and moist.  Eyes: Conjunctivae are normal.  Neck: Normal range of motion. Neck supple.  Cardiovascular: Normal rate and normal heart sounds.   irregular  Pulmonary/Chest: Effort normal and breath sounds normal. No respiratory distress. She has no wheezes. She has no rales.  Abdominal: Soft. Bowel sounds are normal. She exhibits no distension. There is no tenderness. There is no rebound.  Musculoskeletal: She exhibits no edema.  Left arm contracted  Neurological: She is alert and oriented to person, place, and time. No cranial nerve deficit. Coordination normal.  At present alert, oriented, full rom of all extremities  Skin: Skin is warm and dry.    ED Course  Procedures (including critical care time)  Pt with altered mental status, pneumonia per nursing home cxr report, positive prelim blood cultures. Pt has hx of MRSA bacterimia. She is on dialysis. Will get labs, repeat CXR, monitor. VS normal here. She is afebrile.   Results for orders placed during the hospital encounter of 05/28/12  CBC  WITH DIFFERENTIAL      Result Value Range   WBC 15.8 (*) 4.0 - 10.5 K/uL   RBC 3.78 (*) 3.87 - 5.11 MIL/uL   Hemoglobin 10.3 (*) 12.0 - 15.0 g/dL   HCT 16.1 (*) 09.6 - 04.5 %   MCV 85.7  78.0 - 100.0 fL   MCH 27.2  26.0 - 34.0 pg   MCHC 31.8  30.0 - 36.0 g/dL   RDW 40.9 (*) 81.1 - 91.4 %   Platelets 134 (*) 150 - 400 K/uL   Neutrophils Relative 93 (*) 43 - 77 %   Neutro Abs 14.7 (*) 1.7 - 7.7 K/uL   Lymphocytes Relative 2 (*) 12 - 46 %   Lymphs Abs 0.4 (*) 0.7 - 4.0 K/uL   Monocytes Relative 5  3 - 12 %   Monocytes Absolute 0.8  0.1 - 1.0 K/uL   Eosinophils Relative 0  0 - 5 %   Eosinophils Absolute 0.0  0.0 - 0.7 K/uL   Basophils Relative 0  0 - 1 %   Basophils Absolute 0.0  0.0 - 0.1 K/uL  COMPREHENSIVE METABOLIC PANEL      Result Value Range   Sodium 139  135 - 145 mEq/L   Potassium 3.8  3.5 - 5.1 mEq/L  Chloride 92 (*) 96 - 112 mEq/L   CO2 32  19 - 32 mEq/L   Glucose, Bld 189 (*) 70 - 99 mg/dL   BUN 37 (*) 6 - 23 mg/dL   Creatinine, Ser 1.61 (*) 0.50 - 1.10 mg/dL   Calcium 9.5  8.4 - 09.6 mg/dL   Total Protein 8.6 (*) 6.0 - 8.3 g/dL   Albumin 2.9 (*) 3.5 - 5.2 g/dL   AST 64 (*) 0 - 37 U/L   ALT 38 (*) 0 - 35 U/L   Alkaline Phosphatase 217 (*) 39 - 117 U/L   Total Bilirubin 0.5  0.3 - 1.2 mg/dL   GFR calc non Af Amer 8 (*) >90 mL/min   GFR calc Af Amer 9 (*) >90 mL/min  PROTIME-INR      Result Value Range   Prothrombin Time 38.6 (*) 11.6 - 15.2 seconds   INR 4.30 (*) 0.00 - 1.49  CG4 I-STAT (LACTIC ACID)      Result Value Range   Lactic Acid, Venous 3.39 (*) 0.5 - 2.2 mmol/L   Dg Chest 2 View  05/28/2012  *RADIOLOGY REPORT*  Clinical Data: Chest pain.  CHEST - 2 VIEW  Comparison: 03/29/2012.  Findings: The heart is enlarged but stable.  There is tortuosity and calcification of the thoracic aorta.  Low lung volumes with vascular crowding and streaky bibasilar atelectasis.  There is also vascular congestion without overt pulmonary edema.  The bony thorax is intact.   IMPRESSION: Stable cardiac enlargement. Vascular congestion without overt pulmonary edema.   Original Report Authenticated By: Rudie Meyer, M.D.     Date: 05/28/2012  Rate: 74  Rhythm: normal sinus rhythm  QRS Axis: left  Intervals: QT prolonged  ST/T Wave abnormalities: nonspecific T wave changes  Conduction Disutrbances:first-degree A-V block   Narrative Interpretation:   Old EKG Reviewed: unchanged     1. Bacteremia   2. Weakness   3. Supratherapeutic INR   4. ESRD (end stage renal disease) on dialysis   5. Cough       MDM  Pt with generalized malaise since yesterday, cough, fever up to 101.6 yesterday. Prelim report on blood cultures from nursing home showed gram positive cocci in pairs and rods. Here afebrile. Normal vs. Pt has no complaints. Here elevated wbc, lfts, INR supra therpaputic. Pt does not make urine. Started on vancomycin and levaquin, allergic to penicillins. Pt is a dyalisis pt, will call for admission. Will need transfer to [facility].   7:38 PM Spoke with Dr. Suanne Marker, with [facility]. Will need to see pt prior to transfer, did not want me to put holding orders in bc still has few pt to see prior to her. Will continue close monitoring.          Lottie Mussel, PA-C 05/28/12 1940

## 2012-05-28 NOTE — H&P (Addendum)
[facility] History and Physical  Ebony [name] ZOX:[id number] DOB: [birth year] DOA: 05/28/2012  Referring physician: PCP: Johny Sax, MD  Specialists: NONE  Chief Complaint: Blood cultures with gram-positive cocci per nursing facility  HPI: Ebony [name] is a 79 y.o. female nursing home resident with history of  MRSA bacteremia in the past, end-stage renal disease,diabetes mellitus, dementia and multiple medical problems as listed below who presents with above complaints. History is obtained from chart review and some country lesion from patient. It is reported that she had been weak and lethargic, and per nursing home records had fever>>was diagnosed with pneumonia and started on Avelox on 3/18. She denies cough, and  per nursing facility only makes minimal urine(dialysis patient). Blood cultures were obtained and came back to nursing facility today positive for gram-positive cocci, and she was sent to the ED. She denies diarrhea no nausea or vomiting also denies chest pain and shortness of breath. She was seen in the ED and a chest x-ray was done and showed no acute infiltrates, she was noted to be febrile to 101.8, with lactic acid level elevated at 3.39. She is admitted for further evaluation and management. AtTime of my interview she complains of left wrist pain but otherwise denies any complaints.   Review of Systems: The patient denies anorexia, weight loss,, vision loss, decreased hearing, hoarseness, chest pain, syncope, dyspnea on exertion, peripheral edema, balance deficits, hemoptysis, abdominal pain, melena, hematochezia, severe indigestion/heartburn, hematuria, incontinence, transient blindness, , depression, unusual weight change, abnormal bleeding.  Past Medical History  Diagnosis Date  . Atrial fibrillation   . CAD (coronary artery disease)   . Hypertension   . Anemia   . MRSA bacteremia     hx  . Cellulitis   . GERD (gastroesophageal reflux disease)   .  Acetabulum fracture   . Contracture of elbow     L arm  . Dementia   . Diabetes mellitus     on Insulin  . Decubitus ulcer, buttock 02/07/2011    Pt presents with dressing to R buttocks/coccyx.  . Pneumonia   . Blood transfusion   . Stroke ~ 1990's    Left side paralysis.  Left  foot drop., L arm contracted.  . Chronic kidney disease 03/13/11    dialysis at [facility]; TTS,last tx 03/12/11 hemo x 20years  . Depression   . Hyperlipidemia    Past Surgical History  Procedure Laterality Date  . Arteriovenous graft placement    . Appendectomy    . Cholecystectomy    . Tubal ligation      bilateral  . Abdominal hysterectomy    . Av fistula placement  02/07/2011    Procedure: INSERTION OF ARTERIOVENOUS (AV) GORE-TEX GRAFT THIGH;  Surgeon: Pryor Ochoa, MD;  Location: [facility] OR;  Service: Vascular;  Laterality: Right;  insertion AVGG right thigh  . Av fistula placement  02/12/2011    Procedure: INSERTION OF ARTERIOVENOUS (AV) GORE-TEX GRAFT THIGH;  Surgeon: Nilda Simmer, MD;  Location: [facility] OR;  Service: Vascular;  Laterality: Right;  . Cataract extraction w/ intraocular lens  implant, bilateral    . Avgg removal  05/24/2011    Procedure: REMOVAL OF ARTERIOVENOUS GORETEX GRAFT (AVGG);  Surgeon: Fransisco Hertz, MD;  Location: [facility] OR;  Service: Vascular;  Laterality: Right;  with bovine patch angioplasty  . Application of wound vac  05/24/2011    Procedure: APPLICATION OF WOUND VAC;  Surgeon: Fransisco Hertz, MD;  Location: [facility] OR;  Service: Vascular;  Laterality: Right;  . Insertion of dialysis catheter  10/03/2011    Procedure: INSERTION OF DIALYSIS CATHETER;  Surgeon: Fransisco Hertz, MD;  Location: [facility] OR;  Service: Vascular;  Laterality: Left;  Insertion left femoral tunneled dialysis catheter  . Removal of a dialysis catheter  01/16/2012    Procedure: REMOVAL OF A DIALYSIS CATHETER;  Surgeon: Fransisco Hertz, MD;  Location: [facility] OR;  Service: Vascular;  Laterality: Left;  . Insertion of dialysis  catheter  01/16/2012    Procedure: INSERTION OF DIALYSIS CATHETER;  Surgeon: Fransisco Hertz, MD;  Location: [facility] OR;  Service: Vascular;  Laterality: Left;   Social History:  reports that she quit smoking about 12 years ago. Her smoking use included Cigarettes. She has a 7.5 pack-year smoking history. She has never used smokeless tobacco. She reports that she does not drink alcohol or use illicit drugs. where does patient live-SNF   Allergies  Allergen Reactions  . Aspirin     Unknown    . Heparin     SHE is HIT neg; platelets have been normal on low dose heparin at out pt dialysis ; not clear why this is listed   . Minoxidil     Unknown    . Penicillins     Unknown      Family History  Problem Relation Age of Onset  . Diabetes Mother   . Hypertension Father   . Anesthesia problems Neg Hx   . Hypotension Neg Hx   . Malignant hyperthermia Neg Hx   . Pseudochol deficiency Neg Hx    Prior to Admission medications   Medication Sig Start Date End Date Taking? Authorizing Provider  acetaminophen (TYLENOL) 325 MG tablet Take 325 mg by mouth every 4 (four) hours as needed for pain (headache). For headache.   Yes Historical Provider, MD  amLODipine (NORVASC) 5 MG tablet Take 5 mg by mouth at bedtime. 03/17/11  Yes Srikar Cherlynn Kaiser, MD  docusate sodium (COLACE) 100 MG capsule Take 100 mg by mouth 2 (two) times daily.    Yes Historical Provider, MD  DULoxetine (CYMBALTA) 20 MG capsule Take 20 mg by mouth daily before breakfast.    Yes Historical Provider, MD  feeding supplement (PRO-STAT SUGAR FREE 64) LIQD Take 60 mLs by mouth 2 (two) times daily. Two serving twice daily   Yes Historical Provider, MD  insulin aspart (NOVOLOG) 100 UNIT/ML injection Inject 5 Units into the skin 2 (two) times daily after a meal. For blood glucose > 150   Yes Historical Provider, MD  insulin glargine (LANTUS) 100 UNIT/ML injection Inject 10 Units into the skin at bedtime.    Yes Historical Provider, MD   ipratropium-albuterol (DUONEB) 0.5-2.5 (3) MG/3ML SOLN Take 3 mLs by nebulization every 6 (six) hours as needed (for pneumonia).   Yes Historical Provider, MD  lidocaine-prilocaine (EMLA) cream Apply 1 application topically every Monday, Wednesday, and Friday with hemodialysis.    Yes Historical Provider, MD  metoprolol tartrate (LOPRESSOR) 25 MG tablet Take 25-50 mg by mouth as directed. Take 25 mg once daily Tues, Thurs, Sat. Take 25mg  twice daily Sun, Mon, Wed, Fri   Yes Historical Provider, MD  moxifloxacin (AVELOX) 400 MG tablet Take 400 mg by mouth every evening. 05/27/12 06/02/12 Yes Historical Provider, MD  multivitamin (RENA-VIT) TABS tablet Take 1 tablet by mouth every morning.    Yes Historical Provider, MD  omeprazole (PRILOSEC) 20 MG capsule Take 40 mg by mouth every morning.  Yes Historical Provider, MD  ondansetron (ZOFRAN) 4 MG tablet Take 4 mg by mouth every 8 (eight) hours as needed for nausea.   Yes Historical Provider, MD  oxyCODONE-acetaminophen (PERCOCET/ROXICET) 5-325 MG per tablet Take 1-2 tablets by mouth every 4 (four) hours as needed for pain. For pain.  Give 1 tablet for pain scale 1-5 and give 2 tablets for pain scale 6-10. 03/31/12  Yes Marinda Elk, MD  simvastatin (ZOCOR) 20 MG tablet Take 20 mg by mouth at bedtime.    Yes Historical Provider, MD  warfarin (COUMADIN) 5 MG tablet Take 5 mg by mouth every evening.    Yes Historical Provider, MD   Physical Exam: Filed Vitals:   05/28/12 1634 05/28/12 1748 05/28/12 1912 05/28/12 1938  BP:   102/46   Pulse:   71   Temp:  98.3 F (36.8 C) 98.7 F (37.1 C)   TempSrc:  Rectal Axillary   Resp:      SpO2: 100%  98% 100%    Constitutional: Vital signs reviewed.  Patient is a well-developed and well-nourished  in no acute distress and cooperative with exam. Alert and oriented x2.  Head: Normocephalic and atraumatic Mouth: no erythema or exudates, MMM Eyes: PERRL, EOMI, conjunctivae normal, No scleral icterus.   Neck: Supple, Trachea midline normal ROM, No JVD, mass, thyromegaly, or carotid bruit present.  Cardiovascular: RRR, S1 normal, S2 normal, no MRG, pulses symmetric and intact bilaterally Pulmonary/Chest: CTAB, no wheezes, rales, or rhonchi Abdominal: Soft. Non-tender, non-distended, bowel sounds are normal, no masses, organomegaly, or guarding present.  GU: no CVA tenderness  Extremities:no cyanosis and no edema, left upper extremity with wrist flexion contracture, this AV graft in left upper thigh  Neurological: A&O x2, moves right-side grossly, left upper extremity with contractures Skin: Warm, dry and intact. No rash, cyanosis, or clubbing.     Labs on Admission:  Basic Metabolic Panel:  Recent Labs Lab 05/28/12 1724  NA 139  K 3.8  CL 92*  CO2 32  GLUCOSE 189*  BUN 37*  CREATININE 4.90*  CALCIUM 9.5   Liver Function Tests:  Recent Labs Lab 05/28/12 1724  AST 64*  ALT 38*  ALKPHOS 217*  BILITOT 0.5  PROT 8.6*  ALBUMIN 2.9*   No results found for this basename: LIPASE, AMYLASE,  in the last 168 hours No results found for this basename: AMMONIA,  in the last 168 hours CBC:  Recent Labs Lab 05/28/12 1724  WBC 15.8*  NEUTROABS 14.7*  HGB 10.3*  HCT 32.4*  MCV 85.7  PLT 134*   Cardiac Enzymes: No results found for this basename: CKTOTAL, CKMB, CKMBINDEX, TROPONINI,  in the last 168 hours  BNP (last 3 results) No results found for this basename: PROBNP,  in the last 8760 hours CBG: No results found for this basename: GLUCAP,  in the last 168 hours  Radiological Exams on Admission: Dg Chest 2 View  05/28/2012  *RADIOLOGY REPORT*  Clinical Data: Chest pain.  CHEST - 2 VIEW  Comparison: 03/29/2012.  Findings: The heart is enlarged but stable.  There is tortuosity and calcification of the thoracic aorta.  Low lung volumes with vascular crowding and streaky bibasilar atelectasis.  There is also vascular congestion without overt pulmonary edema.  The bony thorax  is intact.  IMPRESSION: Stable cardiac enlargement. Vascular congestion without overt pulmonary edema.   Original Report Authenticated By: Rudie Meyer, M.D.       Assessment/Plan Active Problems:  Gram-positive Bacteremia/sepsis syndrome -As discussed  above, and the patient with prior history of MRSA bacteremia -Repeat cultures obtained in the ED, followup on cultures done at the nursing home -Empiric antibiotics with Vanc and Levaquin(patient allergic to penicillin) -Will obtain echo, order urine culture if patient makes any urine -Chest x-ray with vascular crowding but no definite findings consistent with pneumonia -Consult ID in a.m.   End stage renal disease -She is a dialysis patient, will transfer to [facility], notify  renal  DIABETES MELLITUS, TYPE II, ON INSULIN -Continue Lantus, with sliding scale insulin   ANEMIA OF CHRONIC DISEASE -Stable, follow and recheck   DEMENTIA, MILD   Atrial fibrillation -In sinus rhythm, continue beta blocker as blood pressure tolerates -INR is supratherapeutic, so will hold off Coumadin for now-on each her INR and then Coumadin per pharmacy Supratherapeutic INR -As above, no gross bleeding, follow. History of CVA       Code Status: FULL Family Communication:Pt= alone at bedside Disposition Plan: Admit to [facility]  Time spent: >66mins  Kela Millin [facility] Pager [phone]  If 7PM-7AM, please contact night-coverage [email or web address] Password [facility] 05/28/2012, 10:45 PM

## 2012-05-28 NOTE — Assessment & Plan Note (Signed)
She is presently stable is taking prozac 20 mg daily

## 2012-05-28 NOTE — Assessment & Plan Note (Signed)
There is no change in her status she is on long term coumadin therapy; her inr is somewhat difficult to manage

## 2012-05-28 NOTE — Assessment & Plan Note (Signed)
She is presently stable colace twice daily

## 2012-05-28 NOTE — Assessment & Plan Note (Signed)
Her blood pressure is stable is taking norvasc 5 mg daily takes lopressor 25 mg daily on dialysis days and 25 mg twice daily on other days

## 2012-05-28 NOTE — Progress Notes (Signed)
Subjective:     Patient ID: Ebony [name], female   DOB: 1929/04/23, 79 y.o.   MRN: [id number] Chief Complaint  Patient presents with  . Medical Managment of Chronic Issues    HPIshe is presently being treated for pneumonia; she had blood cultures drawn which are positive for gram + cocci. She is agreeing to go to the hospital for further evaluation and treatment  HYPERTENSION, UNSPECIFIED Her blood pressure is stable is taking norvasc 5 mg daily takes lopressor 25 mg daily on dialysis days and 25 mg twice daily on other days   DIABETES MELLITUS, TYPE II, ON INSULIN She is presently stable is taking lantus 10 units daily takes novolog 5 units prior to meals for cbg >=150  GI bleed She is presently stable is taking prilosec 40 mg daily   End stage renal disease There is no change in her status her shunt is in her left thigh; is followed by nephrology and is on hemodialysis three days per week. Is taking renavite daily   Atrial fibrillation There is no change in her status she is on long term coumadin therapy; her inr is somewhat difficult to manage   Other and unspecified hyperlipidemia She is presently stable is taking zocor 20 mg daily   Depression She is presently stable is taking prozac 20 mg daily   Unspecified constipation She is presently stable colace twice daily   past procedures: 05-27-12: chest x-ray: small right lower lobe pneumonia/atelectasis; mild cardiomegaly  Current Facility-Administered Medications on File Prior to Visit  Medication Dose Route Frequency Provider Last Rate Last Dose  . levofloxacin (LEVAQUIN) IVPB 750 mg  750 mg Intravenous Q24H Tatyana A Kirichenko, PA-C 100 mL/hr at 05/28/12 2133 750 mg at 05/28/12 2133   Current Outpatient Prescriptions on File Prior to Visit  Medication Sig Dispense Refill  . acetaminophen (TYLENOL) 325 MG tablet Take 325 mg by mouth every 4 (four) hours as needed for pain (headache). For headache.      Marland Kitchen amLODipine  (NORVASC) 5 MG tablet Take 5 mg by mouth at bedtime.      . docusate sodium (COLACE) 100 MG capsule Take 100 mg by mouth 2 (two) times daily.       . DULoxetine (CYMBALTA) 20 MG capsule Take 20 mg by mouth daily before breakfast.       . feeding supplement (PRO-STAT SUGAR FREE 64) LIQD Take 60 mLs by mouth 2 (two) times daily. Two serving twice daily      . insulin aspart (NOVOLOG) 100 UNIT/ML injection Inject 5 Units into the skin 2 (two) times daily after a meal. For blood glucose > 150      . insulin glargine (LANTUS) 100 UNIT/ML injection Inject 10 Units into the skin at bedtime.       Marland Kitchen ipratropium-albuterol (DUONEB) 0.5-2.5 (3) MG/3ML SOLN Take 3 mLs by nebulization every 6 (six) hours as needed (for pneumonia).      Marland Kitchen lidocaine-prilocaine (EMLA) cream Apply 1 application topically every Monday, Wednesday, and Friday with hemodialysis.       Marland Kitchen metoprolol tartrate (LOPRESSOR) 25 MG tablet Take 25-50 mg by mouth as directed. Take 25 mg once daily Tues, Thurs, Sat. Take 25mg  twice daily Sun, Mon, Wed, Fri      . moxifloxacin (AVELOX) 400 MG tablet Take 400 mg by mouth every evening.      . multivitamin (RENA-VIT) TABS tablet Take 1 tablet by mouth every morning.       Marland Kitchen omeprazole (  PRILOSEC) 20 MG capsule Take 40 mg by mouth every morning.       . ondansetron (ZOFRAN) 4 MG tablet Take 4 mg by mouth every 8 (eight) hours as needed for nausea.      Marland Kitchen oxyCODONE-acetaminophen (PERCOCET/ROXICET) 5-325 MG per tablet Take 1-2 tablets by mouth every 4 (four) hours as needed for pain. For pain.  Give 1 tablet for pain scale 1-5 and give 2 tablets for pain scale 6-10.      . simvastatin (ZOCOR) 20 MG tablet Take 20 mg by mouth at bedtime.       Marland Kitchen warfarin (COUMADIN) 5 MG tablet Take 5 mg by mouth every evening.        Review of Systems  Constitutional: Positive for fatigue. Negative for appetite change.  Respiratory: Positive for cough and shortness of breath. Negative for wheezing.   Cardiovascular:  Negative for chest pain and palpitations.  Gastrointestinal: Negative for abdominal pain, diarrhea and constipation.  Musculoskeletal: Negative for myalgias, back pain and arthralgias.  Skin: Negative.   Neurological: Negative for headaches.  Psychiatric/Behavioral: Negative.    Filed Vitals:   05/28/12 2224  BP: 115/76  Pulse: 64   Filed Vitals:   05/28/12 2224  Height: 5\' 2"  (1.575 m)  Weight: 147 lb (66.679 kg)       Objective:   Physical Exam  Constitutional:  She is frail appearinga  HENT:  Head: Normocephalic.  Neck: Neck supple.  Cardiovascular: Normal rate, regular rhythm and normal heart sounds.   Pulmonary/Chest: Effort normal. No respiratory distress. She has rales.  Abdominal: Soft. Bowel sounds are normal.  Musculoskeletal: She exhibits no edema.  She has a left upper extremity with severe contractures present   Neurological: She is alert.  Not orientated to time or place  Skin: Skin is warm and dry.  Psychiatric: She has a normal mood and affect.   Lab reviewed: 04-02-12: wbc 7.9; hgb 8.8;  hct 27.5; mcv 85.4; plt 261; glucose 197; bun 14; creat 3.75 K+ 4.0; na++ 138; liver normal albumin 3.0  05-28-12: blood culture: gm + cocci     Assessment:     Esrd htn; dm; gib; afib; depression; constipation; hyperlipidemia;     Plan:      will send her to the er for further evaluation; due to her positive blood cultures; will not make further changes and will continue to monitor her status

## 2012-05-28 NOTE — ED Notes (Signed)
Pt arrived via [facility] from [facility]. Staff from [facility] states pt has possible pneumonia. Pt had an x-ray chest 1 view done on 05/27/12 which showed small right lower lobe pneumonia and/or atelectasis. Pt has hx of dementia, which pt is acting normal per [facility] staff and [facility]. Pt is a dialysis pt that goes M,W, F at [facility].

## 2012-05-28 NOTE — Assessment & Plan Note (Signed)
She is presently stable is taking zocor 20 mg daily

## 2012-05-28 NOTE — ED Notes (Signed)
Patient transported to X-ray 

## 2012-05-28 NOTE — ED Notes (Signed)
IT being called to try to log Alessandra Grout, NP out of pt's chart so that signed and held orders and bed request can be placed for stepdown at [facility].

## 2012-05-28 NOTE — ED Notes (Signed)
Pt states she is on a T, TH, Sat Dialysis treatment schedule.

## 2012-05-28 NOTE — Assessment & Plan Note (Signed)
She is presently stable is taking prilosec 40 mg daily

## 2012-05-28 NOTE — ED Notes (Signed)
ZOX:[id number]<AV> Expected date:<BR> Expected time:<BR> Means of arrival:<BR> Comments:<BR> pna

## 2012-05-28 NOTE — ED Provider Notes (Signed)
Medical screening examination/treatment/procedure(s) were performed by non-physician practitioner and as supervising physician I was immediately available for consultation/collaboration.  Prabhnoor Ellenberger R. Jasimine Simms, MD 05/28/12 2348 

## 2012-05-29 ENCOUNTER — Inpatient Hospital Stay (HOSPITAL_COMMUNITY): Payer: Medicare Other

## 2012-05-29 DIAGNOSIS — I369 Nonrheumatic tricuspid valve disorder, unspecified: Secondary | ICD-10-CM

## 2012-05-29 LAB — GLUCOSE, CAPILLARY
Glucose-Capillary: 88 mg/dL (ref 70–99)
Glucose-Capillary: 170 mg/dL — ABNORMAL HIGH (ref 70–99)
Glucose-Capillary: 193 mg/dL — ABNORMAL HIGH (ref 70–99)

## 2012-05-29 LAB — PROTIME-INR
INR: 4.93 — ABNORMAL HIGH (ref 0.00–1.49)
Prothrombin Time: 42.7 seconds — ABNORMAL HIGH (ref 11.6–15.2)

## 2012-05-29 LAB — CBC
HCT: 29.1 % — ABNORMAL LOW (ref 36.0–46.0)
MCHC: 32.3 g/dL (ref 30.0–36.0)
Platelets: 127 10*3/uL — ABNORMAL LOW (ref 150–400)
RDW: 18.2 % — ABNORMAL HIGH (ref 11.5–15.5)
WBC: 12.8 10*3/uL — ABNORMAL HIGH (ref 4.0–10.5)

## 2012-05-29 LAB — BASIC METABOLIC PANEL
BUN: 41 mg/dL — ABNORMAL HIGH (ref 6–23)
Creatinine, Ser: 5.61 mg/dL — ABNORMAL HIGH (ref 0.50–1.10)
GFR calc Af Amer: 7 mL/min — ABNORMAL LOW (ref >90)
GFR calc non Af Amer: 6 mL/min — ABNORMAL LOW (ref >90)
Potassium: 3.7 mEq/L (ref 3.5–5.1)

## 2012-05-29 LAB — MRSA PCR SCREENING: MRSA by PCR: NEGATIVE

## 2012-05-29 MED ORDER — WARFARIN - PHARMACIST DOSING INPATIENT: Freq: Every day | Status: DC

## 2012-05-29 MED ORDER — ONDANSETRON HCL 4 MG PO TABS: 4.0000 mg | ORAL_TABLET | Freq: Four times a day (QID) | ORAL | Status: DC | PRN

## 2012-05-29 MED ORDER — SODIUM CHLORIDE 0.9 % IV SOLN: 250.0000 mL | INTRAVENOUS | Status: DC | PRN

## 2012-05-29 MED ORDER — PANTOPRAZOLE SODIUM 40 MG PO TBEC
40.0000 mg | DELAYED_RELEASE_TABLET | Freq: Every day | ORAL | Status: DC
  Administered 2012-05-30 – 2012-06-02 (×4): 40 mg via ORAL
  Filled 2012-05-29 (×5): qty 1

## 2012-05-29 MED ORDER — ACETAMINOPHEN 325 MG PO TABS: 650.0000 mg | ORAL_TABLET | Freq: Four times a day (QID) | ORAL | Status: DC | PRN

## 2012-05-29 MED ORDER — SIMVASTATIN 20 MG PO TABS
20.0000 mg | ORAL_TABLET | Freq: Every day | ORAL | Status: DC
  Administered 2012-05-30 – 2012-06-02 (×5): 20 mg via ORAL
  Filled 2012-05-29 (×6): qty 1

## 2012-05-29 MED ORDER — PRO-STAT SUGAR FREE PO LIQD
60.0000 mL | Freq: Two times a day (BID) | ORAL | Status: DC
  Administered 2012-05-29 – 2012-06-02 (×7): 60 mL via ORAL
  Filled 2012-05-29 (×12): qty 60

## 2012-05-29 MED ORDER — INSULIN GLARGINE 100 UNIT/ML ~~LOC~~ SOLN
10.0000 [IU] | Freq: Every day | SUBCUTANEOUS | Status: DC
  Administered 2012-05-29 – 2012-06-02 (×6): 10 [IU] via SUBCUTANEOUS
  Filled 2012-05-29 (×7): qty 0.1

## 2012-05-29 MED ORDER — SODIUM CHLORIDE 0.9 % IJ SOLN
3.0000 mL | Freq: Two times a day (BID) | INTRAMUSCULAR | Status: DC
  Administered 2012-05-29 – 2012-06-02 (×8): 3 mL via INTRAVENOUS

## 2012-05-29 MED ORDER — NEPRO/CARBSTEADY PO LIQD
237.0000 mL | Freq: Two times a day (BID) | ORAL | Status: DC
  Administered 2012-05-29 – 2012-06-02 (×7): 237 mL via ORAL
  Filled 2012-05-29 (×4): qty 237

## 2012-05-29 MED ORDER — IPRATROPIUM-ALBUTEROL 0.5-2.5 (3) MG/3ML IN SOLN: 3.0000 mL | Freq: Four times a day (QID) | RESPIRATORY_TRACT | Status: DC | PRN

## 2012-05-29 MED ORDER — ALBUTEROL SULFATE (5 MG/ML) 0.5% IN NEBU: 2.5000 mg | INHALATION_SOLUTION | Freq: Four times a day (QID) | RESPIRATORY_TRACT | Status: DC | PRN

## 2012-05-29 MED ORDER — VANCOMYCIN HCL 1000 MG IV SOLR
750.0000 mg | INTRAVENOUS | Status: DC
  Administered 2012-05-29 – 2012-05-31 (×2): 750 mg via INTRAVENOUS
  Filled 2012-05-29 (×3): qty 750

## 2012-05-29 MED ORDER — METOPROLOL TARTRATE 25 MG PO TABS
25.0000 mg | ORAL_TABLET | ORAL | Status: DC
  Administered 2012-06-01: 25 mg via ORAL
  Filled 2012-05-29 (×4): qty 1

## 2012-05-29 MED ORDER — ONDANSETRON HCL 4 MG/2ML IJ SOLN: 4.0000 mg | Freq: Four times a day (QID) | INTRAMUSCULAR | Status: DC | PRN

## 2012-05-29 MED ORDER — ACETAMINOPHEN 650 MG RE SUPP: 650.0000 mg | Freq: Four times a day (QID) | RECTAL | Status: DC | PRN

## 2012-05-29 MED ORDER — PIPERACILLIN-TAZOBACTAM IN DEX 2-0.25 GM/50ML IV SOLN
2.2500 g | Freq: Three times a day (TID) | INTRAVENOUS | Status: DC
  Administered 2012-05-29: 2.25 g via INTRAVENOUS
  Filled 2012-05-29 (×3): qty 50

## 2012-05-29 MED ORDER — DOCUSATE SODIUM 100 MG PO CAPS
100.0000 mg | ORAL_CAPSULE | Freq: Two times a day (BID) | ORAL | Status: DC
  Administered 2012-05-30 – 2012-06-02 (×6): 100 mg via ORAL
  Filled 2012-05-29 (×11): qty 1

## 2012-05-29 MED ORDER — INSULIN ASPART 100 UNIT/ML ~~LOC~~ SOLN
0.0000 [IU] | Freq: Every day | SUBCUTANEOUS | Status: DC
  Administered 2012-05-29: 02:00:00 via SUBCUTANEOUS

## 2012-05-29 MED ORDER — ONDANSETRON HCL 4 MG PO TABS: 4.0000 mg | ORAL_TABLET | Freq: Three times a day (TID) | ORAL | Status: DC | PRN

## 2012-05-29 MED ORDER — OXYCODONE-ACETAMINOPHEN 5-325 MG PO TABS
1.0000 | ORAL_TABLET | ORAL | Status: DC | PRN
  Administered 2012-05-30 – 2012-05-31 (×3): 2 via ORAL
  Filled 2012-05-29 (×2): qty 2

## 2012-05-29 MED ORDER — RENA-VITE PO TABS
1.0000 | ORAL_TABLET | Freq: Every morning | ORAL | Status: DC
  Filled 2012-05-29: qty 1

## 2012-05-29 MED ORDER — DULOXETINE HCL 20 MG PO CPEP
20.0000 mg | ORAL_CAPSULE | Freq: Every day | ORAL | Status: DC
  Administered 2012-05-29 – 2012-06-02 (×5): 20 mg via ORAL
  Filled 2012-05-29 (×8): qty 1

## 2012-05-29 MED ORDER — METOPROLOL TARTRATE 25 MG PO TABS
25.0000 mg | ORAL_TABLET | ORAL | Status: DC
  Administered 2012-05-30 – 2012-06-02 (×3): 25 mg via ORAL
  Filled 2012-05-29 (×4): qty 1

## 2012-05-29 MED ORDER — INSULIN ASPART 100 UNIT/ML ~~LOC~~ SOLN
0.0000 [IU] | Freq: Three times a day (TID) | SUBCUTANEOUS | Status: DC
  Administered 2012-05-29 – 2012-05-30 (×3): 2 [IU] via SUBCUTANEOUS
  Administered 2012-05-30: 1 [IU] via SUBCUTANEOUS
  Administered 2012-05-30: 2 [IU] via SUBCUTANEOUS
  Administered 2012-05-31: 18:00:00 via SUBCUTANEOUS
  Administered 2012-06-01 – 2012-06-02 (×2): 1 [IU] via SUBCUTANEOUS
  Administered 2012-06-02: 2 [IU] via SUBCUTANEOUS

## 2012-05-29 MED ORDER — SODIUM CHLORIDE 0.9 % IJ SOLN: 3.0000 mL | INTRAMUSCULAR | Status: DC | PRN

## 2012-05-29 MED ORDER — VANCOMYCIN HCL 500 MG IV SOLR
500.0000 mg | Freq: Once | INTRAVENOUS | Status: AC
  Administered 2012-05-29: 500 mg via INTRAVENOUS
  Filled 2012-05-29: qty 500

## 2012-05-29 MED ORDER — METOPROLOL TARTRATE 25 MG PO TABS: 25.0000 mg | ORAL_TABLET | ORAL | Status: DC

## 2012-05-29 MED ORDER — RENA-VITE PO TABS
1.0000 | ORAL_TABLET | Freq: Every day | ORAL | Status: DC
  Administered 2012-05-30 – 2012-06-02 (×5): 1 via ORAL
  Filled 2012-05-29 (×6): qty 1

## 2012-05-29 MED ORDER — IPRATROPIUM BROMIDE 0.02 % IN SOLN: 0.5000 mg | Freq: Four times a day (QID) | RESPIRATORY_TRACT | Status: DC | PRN

## 2012-05-29 NOTE — Progress Notes (Signed)
Report called to receiving RN on  [id number]. VSS. Transferred to [id number] via bed with personal belongings. Called patient's family member with new room number.  [facility]

## 2012-05-29 NOTE — Consult Note (Signed)
[facility] Renal Consultation Note  Indication for Consultation:  Management of ESRD/hemodialysis; anemia, hypertension/volume and secondary hyperparathyroidism  HPI: Ebony [name] is a 79 y.o. female nursing home resident with history of MRSA bacteremia in the past, infected Left upper arm avgg removed 08/2010/ end-stage renal disease,diabetes mellitus, Atrial fib on coumadin (inrs managed at nh) dementia and multiple CVA s with left hemiparesis and multiple other medical problems admitted with positve blood cultures  from the nh(gram positive cocci). She reportedly was more lethargic at the NH with a cxr at [facility]  showing PNA and she was brought  to the ER with fever to 101.8 and admitted for further evaluation and management.  She reports only Left arm pain ongoing for "long time" Elbow radiating to shoulder pain , the side of her contracture. She uses a left femoral avgg for HD with no reported problems form the kidney center with Access , had some lethargy reported at hd 05/27/12.         Past Medical History  Diagnosis Date  . Atrial fibrillation   . CAD (coronary artery disease)   . Hypertension   . Anemia   . MRSA bacteremia     hx  . Cellulitis   . GERD (gastroesophageal reflux disease)   . Acetabulum fracture   . Contracture of elbow     L arm  . Dementia   . Diabetes mellitus     on Insulin  . Decubitus ulcer, buttock 02/07/2011    Pt presents with dressing to R buttocks/coccyx.  . Pneumonia   . Blood transfusion   . Stroke ~ 1990's    Left side paralysis.  Left  foot drop., L arm contracted.  . Chronic kidney disease 03/13/11    dialysis at [facility]; TTS,last tx 03/12/11 hemo x 20years  . Depression   . Hyperlipidemia     Past Surgical History  Procedure Laterality Date  . Arteriovenous graft placement    . Appendectomy    . Cholecystectomy    . Tubal ligation      bilateral  . Abdominal hysterectomy    . Av fistula placement  02/07/2011     Procedure: INSERTION OF ARTERIOVENOUS (AV) GORE-TEX GRAFT THIGH;  Surgeon: Pryor Ochoa, MD;  Location: [facility] OR;  Service: Vascular;  Laterality: Right;  insertion AVGG right thigh  . Av fistula placement  02/12/2011    Procedure: INSERTION OF ARTERIOVENOUS (AV) GORE-TEX GRAFT THIGH;  Surgeon: Nilda Simmer, MD;  Location: [facility] OR;  Service: Vascular;  Laterality: Right;  . Cataract extraction w/ intraocular lens  implant, bilateral    . Avgg removal  05/24/2011    Procedure: REMOVAL OF ARTERIOVENOUS GORETEX GRAFT (AVGG);  Surgeon: Fransisco Hertz, MD;  Location: [facility] OR;  Service: Vascular;  Laterality: Right;  with bovine patch angioplasty  . Application of wound vac  05/24/2011    Procedure: APPLICATION OF WOUND VAC;  Surgeon: Fransisco Hertz, MD;  Location: [facility] OR;  Service: Vascular;  Laterality: Right;  . Insertion of dialysis catheter  10/03/2011    Procedure: INSERTION OF DIALYSIS CATHETER;  Surgeon: Fransisco Hertz, MD;  Location: [facility] OR;  Service: Vascular;  Laterality: Left;  Insertion left femoral tunneled dialysis catheter  . Removal of a dialysis catheter  01/16/2012    Procedure: REMOVAL OF A DIALYSIS CATHETER;  Surgeon: Fransisco Hertz, MD;  Location: [facility] OR;  Service: Vascular;  Laterality: Left;  . Insertion of dialysis catheter  01/16/2012  Procedure: INSERTION OF DIALYSIS CATHETER;  Surgeon: Fransisco Hertz, MD;  Location: [facility] OR;  Service: Vascular;  Laterality: Left;      Family History  Problem Relation Age of Onset  . Diabetes Mother   . Hypertension Father   . Anesthesia problems Neg Hx   . Hypotension Neg Hx   . Malignant hyperthermia Neg Hx   . Pseudochol deficiency Neg Hx    Social= Lives at [facility] and iswheel chair bound. 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 Her smoking use included Cigarettes. She has a 7.5 pack-year smoking history. She has never used smokeless tobacco. She reports that  she does not drink alcohol or use illicit drugs.   Allergies  Allergen Reactions  . Aspirin     Unknown    . Heparin     SHE is HIT neg; platelets have been normal on low dose heparin at out pt dialysis ; not clear why this is listed   . Minoxidil     Unknown    . Penicillins     Unknown      Prior to Admission medications   Medication Sig Start Date End Date Taking? Authorizing Provider  acetaminophen (TYLENOL) 325 MG tablet Take 325 mg by mouth every 4 (four) hours as needed for pain (headache). For headache.   Yes Historical Provider, MD  amLODipine (NORVASC) 5 MG tablet Take 5 mg by mouth at bedtime. 03/17/11  Yes Srikar Cherlynn Kaiser, MD  docusate sodium (COLACE) 100 MG capsule Take 100 mg by mouth 2 (two) times daily.    Yes Historical Provider, MD  DULoxetine (CYMBALTA) 20 MG capsule Take 20 mg by mouth daily before breakfast.    Yes Historical Provider, MD  feeding supplement (PRO-STAT SUGAR FREE 64) LIQD Take 60 mLs by mouth 2 (two) times daily. Two serving twice daily   Yes Historical Provider, MD  insulin aspart (NOVOLOG) 100 UNIT/ML injection Inject 5 Units into the skin 2 (two) times daily after a meal. For blood glucose > 150   Yes Historical Provider, MD  insulin glargine (LANTUS) 100 UNIT/ML injection Inject 10 Units into the skin at bedtime.    Yes Historical Provider, MD  ipratropium-albuterol (DUONEB) 0.5-2.5 (3) MG/3ML SOLN Take 3 mLs by nebulization every 6 (six) hours as needed (for pneumonia).   Yes Historical Provider, MD  lidocaine-prilocaine (EMLA) cream Apply 1 application topically every Monday, Wednesday, and Friday with hemodialysis.    Yes Historical Provider, MD  metoprolol tartrate (LOPRESSOR) 25 MG tablet Take 25-50 mg by mouth as directed. Take 25 mg once daily Tues, Thurs, Sat. Take 25mg  twice daily Sun, Mon, Wed, Fri   Yes Historical Provider, MD  moxifloxacin (AVELOX) 400 MG tablet Take 400 mg by mouth every evening. 05/27/12 06/02/12 Yes Historical Provider,  MD  multivitamin (RENA-VIT) TABS tablet Take 1 tablet by mouth every morning.    Yes Historical Provider, MD  omeprazole (PRILOSEC) 20 MG capsule Take 40 mg by mouth every morning.    Yes Historical Provider, MD  ondansetron (ZOFRAN) 4 MG tablet Take 4 mg by mouth every 8 (eight) hours as needed for nausea.   Yes Historical Provider, MD  oxyCODONE-acetaminophen (PERCOCET/ROXICET) 5-325 MG per tablet Take 1-2 tablets by mouth every 4 (four) hours as needed for pain. For pain.  Give 1 tablet for pain scale 1-5 and give 2 tablets for pain  scale 6-10. 03/31/12  Yes Marinda Elk, MD  simvastatin (ZOCOR) 20 MG tablet Take 20 mg by mouth at bedtime.    Yes Historical Provider, MD  warfarin (COUMADIN) 5 MG tablet Take 5 mg by mouth every evening.    Yes Historical Provider, MD     Scheduled: . docusate sodium  100 mg Oral BID  . DULoxetine  20 mg Oral QAC breakfast  . feeding supplement (NEPRO CARB STEADY)  237 mL Oral BID BM  . feeding supplement  60 mL Oral BID  . insulin aspart  0-5 Units Subcutaneous QHS  . insulin aspart  0-9 Units Subcutaneous TID WC  . insulin glargine  10 Units Subcutaneous QHS  . metoprolol tartrate  25 mg Oral Q T,Th,S,Su  . [START ON 05/30/2012] metoprolol tartrate  25 mg Oral Custom  . multivitamin  1 tablet Oral QHS  . pantoprazole  40 mg Oral Daily  . simvastatin  20 mg Oral QHS  . sodium chloride  3 mL Intravenous Q12H  . vancomycin  750 mg Intravenous Q T,Th,Sa-HD  . Warfarin - Pharmacist Dosing Inpatient   Does not apply q1800    Results for orders placed during the hospital encounter of 05/28/12 (from the past 48 hour(s))  CBC WITH DIFFERENTIAL     Status: Abnormal   Collection Time    05/28/12  5:24 PM      Result Value Range   WBC 15.8 (*) 4.0 - 10.5 K/uL   RBC 3.78 (*) 3.87 - 5.11 MIL/uL   Hemoglobin 10.3 (*) 12.0 - 15.0 g/dL   HCT 96.0 (*) 45.4 - 09.8 %   MCV 85.7  78.0 - 100.0 fL   MCH 27.2  26.0 - 34.0 pg   MCHC 31.8  30.0 - 36.0 g/dL   RDW  11.9 (*) 14.7 - 15.5 %   Platelets 134 (*) 150 - 400 K/uL   Neutrophils Relative 93 (*) 43 - 77 %   Neutro Abs 14.7 (*) 1.7 - 7.7 K/uL   Lymphocytes Relative 2 (*) 12 - 46 %   Lymphs Abs 0.4 (*) 0.7 - 4.0 K/uL   Monocytes Relative 5  3 - 12 %   Monocytes Absolute 0.8  0.1 - 1.0 K/uL   Eosinophils Relative 0  0 - 5 %   Eosinophils Absolute 0.0  0.0 - 0.7 K/uL   Basophils Relative 0  0 - 1 %   Basophils Absolute 0.0  0.0 - 0.1 K/uL  COMPREHENSIVE METABOLIC PANEL     Status: Abnormal   Collection Time    05/28/12  5:24 PM      Result Value Range   Sodium 139  135 - 145 mEq/L   Potassium 3.8  3.5 - 5.1 mEq/L   Chloride 92 (*) 96 - 112 mEq/L   CO2 32  19 - 32 mEq/L   Glucose, Bld 189 (*) 70 - 99 mg/dL   BUN 37 (*) 6 - 23 mg/dL   Creatinine, Ser 8.29 (*) 0.50 - 1.10 mg/dL   Calcium 9.5  8.4 - 56.2 mg/dL   Total Protein 8.6 (*) 6.0 - 8.3 g/dL   Albumin 2.9 (*) 3.5 - 5.2 g/dL   AST 64 (*) 0 - 37 U/L   ALT 38 (*) 0 - 35 U/L   Alkaline Phosphatase 217 (*) 39 - 117 U/L   Total Bilirubin 0.5  0.3 - 1.2 mg/dL   GFR calc non Af Amer 8 (*) >90 mL/min  GFR calc Af Amer 9 (*) >90 mL/min   Comment:            The eGFR has been calculated     using the CKD EPI equation.     This calculation has not been     validated in all clinical     situations.     eGFR's persistently     <90 mL/min signify     possible Chronic Kidney Disease.  PROTIME-INR     Status: Abnormal   Collection Time    05/28/12  5:24 PM      Result Value Range   Prothrombin Time 38.6 (*) 11.6 - 15.2 seconds   INR 4.30 (*) 0.00 - 1.49  CULTURE, BLOOD (ROUTINE X 2)     Status: None   Collection Time    05/28/12  5:35 PM      Result Value Range   Specimen Description BLOOD RIGHT ARM     Special Requests BOTTLES DRAWN AEROBIC AND ANAEROBIC 3CC     Culture  Setup Time 05/29/2012 02:43     Culture       Value: GRAM POSITIVE COCCI IN PAIRS AND CHAINS     Note: Gram Stain Report Called to,Read Back By and Verified With:  HOLLY MATTHEWS @ 1228 05/29/12 BY KRAWS   Report Status PENDING    CG4 I-STAT (LACTIC ACID)     Status: Abnormal   Collection Time    05/28/12  5:38 PM      Result Value Range   Lactic Acid, Venous 3.39 (*) 0.5 - 2.2 mmol/L  CULTURE, BLOOD (ROUTINE X 2)     Status: None   Collection Time    05/28/12  5:45 PM      Result Value Range   Specimen Description BLOOD RIGHT HAND     Special Requests BOTTLES DRAWN AEROBIC AND ANAEROBIC 3CC     Culture  Setup Time 05/29/2012 02:43     Culture       Value: GRAM POSITIVE COCCI IN PAIRS AND CHAINS     Note: Gram Stain Report Called to,Read Back By and Verified With: HOLLY MATTHEWS @ 1228 05/29/12 BY KRAWS   Report Status PENDING    GLUCOSE, CAPILLARY     Status: Abnormal   Collection Time    05/29/12 12:59 AM      Result Value Range   Glucose-Capillary 238 (*) 70 - 99 mg/dL   Comment 1 Notify RN     Comment 2 Documented in Chart    MRSA PCR SCREENING     Status: None   Collection Time    05/29/12  1:06 AM      Result Value Range   MRSA by PCR NEGATIVE  NEGATIVE   Comment:            The GeneXpert MRSA Assay (FDA     approved for NASAL specimens     only), is one component of a     comprehensive MRSA colonization     surveillance program. It is not     intended to diagnose MRSA     infection nor to guide or     monitor treatment for     MRSA infections.  CBC     Status: Abnormal   Collection Time    05/29/12  3:30 AM      Result Value Range   WBC 12.8 (*) 4.0 - 10.5 K/uL   RBC 3.46 (*) 3.87 - 5.11 MIL/uL  Hemoglobin 9.4 (*) 12.0 - 15.0 g/dL   HCT 16.1 (*) 09.6 - 04.5 %   MCV 84.1  78.0 - 100.0 fL   MCH 27.2  26.0 - 34.0 pg   MCHC 32.3  30.0 - 36.0 g/dL   RDW 40.9 (*) 81.1 - 91.4 %   Platelets 127 (*) 150 - 400 K/uL  BASIC METABOLIC PANEL     Status: Abnormal   Collection Time    05/29/12  3:30 AM      Result Value Range   Sodium 140  135 - 145 mEq/L   Potassium 3.7  3.5 - 5.1 mEq/L   Chloride 96  96 - 112 mEq/L   CO2 30   19 - 32 mEq/L   Glucose, Bld 166 (*) 70 - 99 mg/dL   BUN 41 (*) 6 - 23 mg/dL   Creatinine, Ser 7.82 (*) 0.50 - 1.10 mg/dL   Calcium 9.3  8.4 - 95.6 mg/dL   GFR calc non Af Amer 6 (*) >90 mL/min   GFR calc Af Amer 7 (*) >90 mL/min   Comment:            The eGFR has been calculated     using the CKD EPI equation.     This calculation has not been     validated in all clinical     situations.     eGFR's persistently     <90 mL/min signify     possible Chronic Kidney Disease.  PROCALCITONIN     Status: None   Collection Time    05/29/12  3:30 AM      Result Value Range   Procalcitonin 127.45     Comment:            Interpretation:     PCT >= 10 ng/mL:     Important systemic inflammatory response,     almost exclusively due to severe bacterial     sepsis or septic shock.     (NOTE)             ICU PCT Algorithm               Non ICU PCT Algorithm        ----------------------------     ------------------------------             PCT < 0.25 ng/mL                 PCT < 0.1 ng/mL         Stopping of antibiotics            Stopping of antibiotics           strongly encouraged.               strongly encouraged.        ----------------------------     ------------------------------           PCT level decrease by               PCT < 0.25 ng/mL           >= 80% from peak PCT           OR PCT 0.25 - 0.5 ng/mL          Stopping of antibiotics  encouraged.         Stopping of antibiotics               encouraged.        ----------------------------     ------------------------------           PCT level decrease by              PCT >= 0.25 ng/mL           < 80% from peak PCT            AND PCT >= 0.5 ng/mL            Continuing antibiotics                                                  encouraged.           Continuing antibiotics                encouraged.        ----------------------------     ------------------------------          PCT level increase compared          PCT > 0.5 ng/mL             with peak PCT AND              PCT >= 0.5 ng/mL             Escalation of antibiotics                                              strongly encouraged.          Escalation of antibiotics            strongly encouraged.  PROTIME-INR     Status: Abnormal   Collection Time    05/29/12  3:30 AM      Result Value Range   Prothrombin Time 42.7 (*) 11.6 - 15.2 seconds   INR 4.93 (*) 0.00 - 1.49  GLUCOSE, CAPILLARY     Status: None   Collection Time    05/29/12  8:12 AM      Result Value Range   Glucose-Capillary 88  70 - 99 mg/dL     ROS: denies sob,chest pain, abnormal bms', cough , or any urinary symptoms.    Physical Exam: Filed Vitals:   05/29/12 1200  BP:   Pulse: 73  Temp:   Resp: 21     General: Alert Elderly BF NAD feeding herself Lunch . Chronically ill, HEENT: [place], MMM Eyes:  eomi Neck:  No jvd Heart: Irreg , IRREG, 2/6 sem lsb , no rub Lungs: CTA with slightly decreased resp  effort Abdomen: BS positive, soft, nontender Extremities: trace to 1+ bipedal edma Skin: dry scaling type appearance of lower extr/ no ulcers and rash noted Neuro: left hemiparesis, Oriented to person and place Dialysis Access: left femoral avgg positive bruit  Dialysis Orders: Center: [facility] on TTS . EDW 66.5 kg HD Bath 2k, 2.25 ca  Time 4hrs Heparin 0. Access left femoral avgg BFR 400 DFR 800    Hectorol 1 mcg IV/HD Epogen 0   Units IV/HD  Venofer  0  Other 0  Assessment/Plan 1. Sepsis with Gram-positive Cocci  Blood Cultures  x2 (BC from [facility]) and reported" PNA" from NH CXR= per admit team  Now on Vancomycin and IV Levaquin 2. ESRD -  TTS ( [facility] out pt. Center) on schedule for now. 3.7 k   Use 4.0 k bath 3. Hypertension/volume  -  Bp=104/ 48 and on metoprolol / keep even on hd today  She is at her edw of 66.5 kg CXR showing no acute infiltrate 4. Atrial Fib with high INR on coumadin = per admit 5. HO HIT= no heparin   hd 6. Anemia  - Hgb 9.4, no outpt. epo  Or iron  Last hgb op center 11.4(05/22/12) , start Aranesp in hosp 60 mcg q thurs hd .( Last tsf =30% 05/22/12) 7. Metabolic bone disease -  Hectoral on hd/ no binders listed with 3.4 last op phos/   Fu ca /phos in hosp. 8. Nutrition - Renavite and high proteibn/carb mod diet 9. IDDM type 2 = per admit  10. HO Dementia and  CVA's with left hemiparesis/left upper ext contracture= [facility]  Lenny Pastel, PA-C [facility] Beeper [phone] 05/29/2012, 1:01 PM   I have seen and examined this patient and agree with plan as outlined by D. Zeyfang, PA-C.  Pt with visual hallucinations.  Asks if she is in a basement because she sees a bunch of toys.  "I see planes, and trains, and toy cars everywhere".  Unclear etiology of sepsis, her left thigh AVG +T/B, no drainage or fluctuant area.  ?PNA with sepsis?  On abx and WBC has improved.  Cont to follow.  Cont with HD qTTS.  Has a tender and severe LUE contracture. Adem Costlow A,MD 05/29/2012 4:06 PM

## 2012-05-29 NOTE — Progress Notes (Signed)
CRITICAL VALUE ALERT  Critical value received:  Both anaerobic bottles came back gram positive cocci in pairs and chains   Date of notification:  05/29/2012  Time of notification:  1230  Critical value read back:yes  Nurse who received alert:  Celesta Gentile  MD notified (1st page):  Dr. Ardyth Harps  Time of first page:  1230  MD notified (2nd page):  Time of second page:  Responding MD:  Dr. Ardyth Harps  Time MD responded:  1300

## 2012-05-29 NOTE — Progress Notes (Signed)
INITIAL NUTRITION ASSESSMENT  DOCUMENTATION CODES Per approved criteria  -Not Applicable   INTERVENTION: Nepro Shake BID which will provide 850 kcal and 38 grams of protein  NUTRITION DIAGNOSIS: Increased nutrient needs related to dialysis and pressure ulcer as evidenced by estimated nutrition needs.  Goal: Intake to meet >/=90% estimated nutrition needs  Monitor:  Tolerance of Nepro Shake, PO's, weight trends, I/O's, labs  Reason for Assessment: Malnutrition Screening Tool  79 y.o. female  Admitting Dx: Arm pain and lethargy  ASSESSMENT: Pt admitted shortly after HD treatment on 3/18 due to lethargy and extreme chills, possible pneumonia. Pt with hx of dementia and Type II DM. Pt with hx of unspecified constipation and is on Colace BID.  Pt with c/o arm pain after admission which she reports she has had since having a stroke in the 1990s. Radiology report from yesterday shows stable cardiac enlargement and vascular congestion without overt pulmonary edema.   Spoke with pt who reports a good appetite and no recent weight loss. Per weight hx, pt has lost 13 pounds in the past two months, may be attributable to fluid loss during dialysis. She reports that she drinks Nepro 1-2 times daily at [facility] and would like Nepro to be ordered for her here. Will provide this BID. Pt is also currently has Pro-Stat ordered BID.  Height: Ht Readings from Last 1 Encounters:  05/29/12 5\' 5"  (1.651 m)    Weight: Wt Readings from Last 1 Encounters:  05/29/12 146 lb 6.2 oz (66.4 kg)    Ideal Body Weight: 125 lbs  % Ideal Body Weight: 117%  Wt Readings from Last 10 Encounters:  05/29/12 146 lb 6.2 oz (66.4 kg)  05/28/12 147 lb (66.679 kg)  03/30/12 159 lb 6.3 oz (72.3 kg)  01/15/12 141 lb (63.957 kg)  01/15/12 141 lb (63.957 kg)  10/03/11 158 lb 15.2 oz (72.1 kg)  10/03/11 158 lb 15.2 oz (72.1 kg)  09/28/11 159 lb (72.122 kg)  08/24/11 158 lb (71.668 kg)  07/06/11 158 lb  (71.668 kg)    Usual Body Weight: 158 lbs  % Usual Body Weight: 92%  BMI:  Body mass index is 24.36 kg/(m^2).--Normal  Estimated Nutritional Needs: Kcal: 1800-2000 Protein: 90-100 grams Fluid: 1.8-2.0 L/day  Skin: Stage I sacral pressure ulcer  Diet Order: Carb Control, Medium  EDUCATION NEEDS: -No education needs identified at this time   Intake/Output Summary (Last 24 hours) at 05/29/12 0814 Last data filed at 05/29/12 0600  Gross per 24 hour  Intake      0 ml  Output      0 ml  Net      0 ml    Last BM: PTA  Labs:   Recent Labs Lab 05/28/12 1724 05/29/12 0330  NA 139 140  K 3.8 3.7  CL 92* 96  CO2 32 30  BUN 37* 41*  CREATININE 4.90* 5.61*  CALCIUM 9.5 9.3  GLUCOSE 189* 166*    CBG (last 3)   Recent Labs  05/29/12 0059  GLUCAP 238*    Scheduled Meds: . docusate sodium  100 mg Oral BID  . DULoxetine  20 mg Oral QAC breakfast  . feeding supplement  60 mL Oral BID  . insulin aspart  0-5 Units Subcutaneous QHS  . insulin aspart  0-9 Units Subcutaneous TID WC  . insulin glargine  10 Units Subcutaneous QHS  . metoprolol tartrate  25 mg Oral Q T,Th,S,Su  . [START ON 05/30/2012] metoprolol tartrate  25 mg Oral Custom  . multivitamin  1 tablet Oral q morning - 10a  . pantoprazole  40 mg Oral Daily  . piperacillin-tazobactam (ZOSYN)  IV  2.25 g Intravenous Q8H  . simvastatin  20 mg Oral QHS  . sodium chloride  3 mL Intravenous Q12H  . vancomycin  750 mg Intravenous Q T,Th,Sa-HD    Continuous Infusions:   Past Medical History  Diagnosis Date  . Atrial fibrillation   . CAD (coronary artery disease)   . Hypertension   . Anemia   . MRSA bacteremia     hx  . Cellulitis   . GERD (gastroesophageal reflux disease)   . Acetabulum fracture   . Contracture of elbow     L arm  . Dementia   . Diabetes mellitus     on Insulin  . Decubitus ulcer, buttock 02/07/2011    Pt presents with dressing to R buttocks/coccyx.  . Pneumonia   . Blood  transfusion   . Stroke ~ 1990's    Left side paralysis.  Left  foot drop., L arm contracted.  . Chronic kidney disease 03/13/11    dialysis at [facility]; TTS,last tx 03/12/11 hemo x 20years  . Depression   . Hyperlipidemia     Past Surgical History  Procedure Laterality Date  . Arteriovenous graft placement    . Appendectomy    . Cholecystectomy    . Tubal ligation      bilateral  . Abdominal hysterectomy    . Av fistula placement  02/07/2011    Procedure: INSERTION OF ARTERIOVENOUS (AV) GORE-TEX GRAFT THIGH;  Surgeon: Pryor Ochoa, MD;  Location: [facility] OR;  Service: Vascular;  Laterality: Right;  insertion AVGG right thigh  . Av fistula placement  02/12/2011    Procedure: INSERTION OF ARTERIOVENOUS (AV) GORE-TEX GRAFT THIGH;  Surgeon: Nilda Simmer, MD;  Location: [facility] OR;  Service: Vascular;  Laterality: Right;  . Cataract extraction w/ intraocular lens  implant, bilateral    . Avgg removal  05/24/2011    Procedure: REMOVAL OF ARTERIOVENOUS GORETEX GRAFT (AVGG);  Surgeon: Fransisco Hertz, MD;  Location: [facility] OR;  Service: Vascular;  Laterality: Right;  with bovine patch angioplasty  . Application of wound vac  05/24/2011    Procedure: APPLICATION OF WOUND VAC;  Surgeon: Fransisco Hertz, MD;  Location: [facility] OR;  Service: Vascular;  Laterality: Right;  . Insertion of dialysis catheter  10/03/2011    Procedure: INSERTION OF DIALYSIS CATHETER;  Surgeon: Fransisco Hertz, MD;  Location: [facility] OR;  Service: Vascular;  Laterality: Left;  Insertion left femoral tunneled dialysis catheter  . Removal of a dialysis catheter  01/16/2012    Procedure: REMOVAL OF A DIALYSIS CATHETER;  Surgeon: Fransisco Hertz, MD;  Location: [facility] OR;  Service: Vascular;  Laterality: Left;  . Insertion of dialysis catheter  01/16/2012    Procedure: INSERTION OF DIALYSIS CATHETER;  Surgeon: Fransisco Hertz, MD;  Location: [facility] OR;  Service: Vascular;  Laterality: Left;    Trenton Gammon Dietetic Intern # [phone]  I agree with student  dietitian note; appropriate revisions have been made.  Joaquin Courts, RD, LDN, CNSC Pager# [phone] After Hours Pager# [phone]

## 2012-05-29 NOTE — Progress Notes (Deleted)
CRITICAL VALUE ALERT  Critical value received:  Blood cultures came back positive in both anaerobic bottles. Gram negative cocci in pairs and chains  Date of notification:  05/29/2012  Time of notification:  12:31 PM  Critical value read back:yes  Nurse who received alert:  Celesta Gentile  MD notified (1st page):  Dr. Ardyth Harps  Time of first page:  12:31 PM  MD notified (2nd page):  Time of second page:  Responding MD:  Dr. Ardyth Harps  Time MD responded:  1300- pt already on antibiotics

## 2012-05-29 NOTE — Progress Notes (Signed)
ANTICOAGULATION and ANTIBIOTIC CONSULT NOTE - Initial Consult  Pharmacy Consult for Coumadin, Vancomycin and Zosyn Indication: H/o atrial fibrillation and r/o bacteremia  Allergies  Allergen Reactions  . Aspirin     Unknown    . Heparin     SHE is HIT neg; platelets have been normal on low dose heparin at out pt dialysis ; not clear why this is listed   . Minoxidil     Unknown    . Penicillins     Unknown      Patient Measurements: Height: 5\' 5"  (165.1 cm) IBW/kg (Calculated) : 57  Vital Signs: Temp: 98.3 F (36.8 C) (03/20 0111) Temp src: Oral (03/20 0111) BP: 117/45 mmHg (03/20 0111) Pulse Rate: 72 (03/20 0111) Intake/Output from previous day:   Intake/Output from this shift:    Labs:  Recent Labs  05/28/12 1724  HGB 10.3*  HCT 32.4*  PLT 134*  LABPROT 38.6*  INR 4.30*  CREATININE 4.90*    Recent Labs  05/28/12 1724  WBC 15.8*  HGB 10.3*  PLT 134*  CREATININE 4.90*   The CrCl is unknown because both a height and weight (above a minimum accepted value) are required for this calculation. No results found for this basename: VANCOTROUGH, VANCOPEAK, VANCORANDOM, GENTTROUGH, GENTPEAK, GENTRANDOM, TOBRATROUGH, TOBRAPEAK, TOBRARND, AMIKACINPEAK, AMIKACINTROU, AMIKACIN,  in the last 72 hours   Microbiology: No results found for this or any previous visit (from the past 720 hour(s)).  Medical History: Past Medical History  Diagnosis Date  . Atrial fibrillation   . CAD (coronary artery disease)   . Hypertension   . Anemia   . MRSA bacteremia     hx  . Cellulitis   . GERD (gastroesophageal reflux disease)   . Acetabulum fracture   . Contracture of elbow     L arm  . Dementia   . Diabetes mellitus     on Insulin  . Decubitus ulcer, buttock 02/07/2011    Pt presents with dressing to R buttocks/coccyx.  . Pneumonia   . Blood transfusion   . Stroke ~ 1990's    Left side paralysis.  Left  foot drop., L arm contracted.  . Chronic kidney disease  03/13/11    dialysis at [facility]; TTS,last tx 03/12/11 hemo x 20years  . Depression   . Hyperlipidemia     Medications:  Scheduled:  . docusate sodium  100 mg Oral BID  . DULoxetine  20 mg Oral QAC breakfast  . feeding supplement  60 mL Oral BID  . insulin aspart  0-5 Units Subcutaneous QHS  . insulin aspart  0-9 Units Subcutaneous TID WC  . insulin glargine  10 Units Subcutaneous QHS  . metoprolol tartrate  25 mg Oral Q T,Th,S,Su  . [START ON 05/30/2012] metoprolol tartrate  25 mg Oral Custom  . multivitamin  1 tablet Oral q morning - 10a  . pantoprazole  40 mg Oral Daily  . simvastatin  20 mg Oral QHS  . sodium chloride  3 mL Intravenous Q12H  . [COMPLETED] vancomycin  1,000 mg Intravenous Once  . [DISCONTINUED] levofloxacin  750 mg Intravenous Q24H  . [DISCONTINUED] metoprolol tartrate  25-50 mg Oral UD   Assessment: 79 yo female with h/o MRSA bacteremia admitted from nursing home with blood cultures positive for gram-positive cocci.  Pharmacy consulted to manage vancomycin and Zosyn. Patient with penicillin allergy with unknown reaction, but received Zosyn during previous January 2014 admission.   Patient already received vancomycin 1gm IV x  1.   Patient with h/o atrial fibrillation on Coumadin PTA. Pharmacy consulted to manage Coumadin. INR 4.3 on admission.   Goal of Therapy:  INR 2-3 Monitor platelets by anticoagulation protocol: Yes Pre-HD vancomycin level 15-25 mcg/mL  Plan:  1. Vancomycin 500mg  IV x 1 (total of 1500mg  vancomycin), then 750mg  IV Q-HD Tues/Thurs/Sat. 2. Zosyn 2.25gm IV Q8H. 3. Follow-up blood cultures.  4. No Coumadin for now. 5. Daily PT / INR 6. Coumadin education with pharmacist.  Emeline Gins 05/29/2012,1:21 AM

## 2012-05-29 NOTE — Progress Notes (Signed)
[facility]             Progress Note   Subjective: No acute events. Seems a little lethargic, altho she can answer questions appropriately.  Objective: Vital signs in last 24 hours: Temp:  [98.1 F (36.7 C)-99.4 F (37.4 C)] 98.5 F (36.9 C) (03/20 1200) Pulse Rate:  [58-82] 73 (03/20 1200) Resp:  [18-28] 21 (03/20 1200) BP: (100-117)/(40-76) 104/48 mmHg (03/20 1057) SpO2:  [96 %-100 %] 97 % (03/20 1200) Weight:  [66.4 kg (146 lb 6.2 oz)-66.679 kg (147 lb)] 66.4 kg (146 lb 6.2 oz) (03/20 0333) Weight change:  Last BM Date: 05/28/12  Intake/Output from previous day:   Total I/O In: 243 [P.O.:240; I.V.:3] Out: -    Physical Exam: General: oriented x 2. HEENT: No bruits, no goiter. Heart: Regular rate and rhythm, without murmurs, rubs, gallops. Lungs: Clear to auscultation bilaterally. Abdomen: Soft, nontender, nondistended, positive bowel sounds. Extremities: No clubbing cyanosis or edema with positive pedal pulses.    Lab Results: Basic Metabolic Panel:  Recent Labs  40/98/11 1724 05/29/12 0330  NA 139 140  K 3.8 3.7  CL 92* 96  CO2 32 30  GLUCOSE 189* 166*  BUN 37* 41*  CREATININE 4.90* 5.61*  CALCIUM 9.5 9.3   Liver Function Tests:  Recent Labs  05/28/12 1724  AST 64*  ALT 38*  ALKPHOS 217*  BILITOT 0.5  PROT 8.6*  ALBUMIN 2.9*   CBC:  Recent Labs  05/28/12 1724 05/29/12 0330  WBC 15.8* 12.8*  NEUTROABS 14.7*  --   HGB 10.3* 9.4*  HCT 32.4* 29.1*  MCV 85.7 84.1  PLT 134* 127*   CBG:  Recent Labs  05/29/12 0059 05/29/12 0812 05/29/12 1241  GLUCAP 238* 88 170*   Coagulation:  Recent Labs  05/28/12 1724 05/29/12 0330  LABPROT 38.6* 42.7*  INR 4.30* 4.93*    Recent Results (from the past 240 hour(s))  CULTURE, BLOOD (ROUTINE X 2)     Status: None   Collection Time    05/28/12  5:35 PM      Result Value Range Status   Specimen Description BLOOD RIGHT ARM   Final   Special Requests BOTTLES DRAWN AEROBIC  AND ANAEROBIC 3CC   Final   Culture  Setup Time 05/29/2012 02:43   Final   Culture     Final   Value: GRAM POSITIVE COCCI IN PAIRS AND CHAINS     Note: Gram Stain Report Called to,Read Back By and Verified With: HOLLY MATTHEWS @ 1228 05/29/12 BY KRAWS   Report Status PENDING   Incomplete  CULTURE, BLOOD (ROUTINE X 2)     Status: None   Collection Time    05/28/12  5:45 PM      Result Value Range Status   Specimen Description BLOOD RIGHT HAND   Final   Special Requests BOTTLES DRAWN AEROBIC AND ANAEROBIC 3CC   Final   Culture  Setup Time 05/29/2012 02:43   Final   Culture     Final   Value: GRAM POSITIVE COCCI IN PAIRS AND CHAINS     Note: Gram Stain Report Called to,Read Back By and Verified With: HOLLY MATTHEWS @ 1228 05/29/12 BY KRAWS   Report Status PENDING   Incomplete  MRSA PCR SCREENING     Status: None   Collection Time    05/29/12  1:06 AM      Result Value Range Status   MRSA by PCR NEGATIVE  NEGATIVE Final  Comment:            The GeneXpert MRSA Assay (FDA     approved for NASAL specimens     only), is one component of a     comprehensive MRSA colonization     surveillance program. It is not     intended to diagnose MRSA     infection nor to guide or     monitor treatment for     MRSA infections.    Studies/Results: Dg Chest 2 View  05/28/2012  *RADIOLOGY REPORT*  Clinical Data: Chest pain.  CHEST - 2 VIEW  Comparison: 03/29/2012.  Findings: The heart is enlarged but stable.  There is tortuosity and calcification of the thoracic aorta.  Low lung volumes with vascular crowding and streaky bibasilar atelectasis.  There is also vascular congestion without overt pulmonary edema.  The bony thorax is intact.  IMPRESSION: Stable cardiac enlargement. Vascular congestion without overt pulmonary edema.   Original Report Authenticated By: Rudie Meyer, M.D.    Dg Wrist 2 Views Left  05/29/2012  *RADIOLOGY REPORT*  Clinical Data: Pain  LEFT WRIST - 2 VIEW  Comparison: None.   Findings: The study is limited by difficulty with positioning.  The wrist is in flexion.  There is a probable small fracture of the dorsal cortex of the distal ulna.  This is seen only on the lateral view.  IMPRESSION: Limited study.  There is a probable small fracture of the dorsal cortex of the distal ulna.  Further imaging is suggested to confirm.   Original Report Authenticated By: Janeece Riggers, M.D.     Medications: Scheduled Meds: . docusate sodium  100 mg Oral BID  . DULoxetine  20 mg Oral QAC breakfast  . feeding supplement (NEPRO CARB STEADY)  237 mL Oral BID BM  . feeding supplement  60 mL Oral BID  . insulin aspart  0-5 Units Subcutaneous QHS  . insulin aspart  0-9 Units Subcutaneous TID WC  . insulin glargine  10 Units Subcutaneous QHS  . metoprolol tartrate  25 mg Oral Q T,Th,S,Su  . [START ON 05/30/2012] metoprolol tartrate  25 mg Oral Custom  . multivitamin  1 tablet Oral QHS  . pantoprazole  40 mg Oral Daily  . simvastatin  20 mg Oral QHS  . sodium chloride  3 mL Intravenous Q12H  . vancomycin  750 mg Intravenous Q T,Th,Sa-HD   Continuous Infusions:  PRN Meds:.sodium chloride, acetaminophen, acetaminophen, albuterol, ipratropium, ondansetron (ZOFRAN) IV, ondansetron, oxyCODONE-acetaminophen, sodium chloride  Assessment/Plan:  Active Problems:   DIABETES MELLITUS, TYPE II, ON INSULIN   ANEMIA OF CHRONIC DISEASE   DEMENTIA, MILD   Atrial fibrillation   End stage renal disease   Bacteremia   Supratherapeutic INR   Sepsis   GPC Bacteremia/Sepsis -Patient is receiving vancomycin. -Leukocytosis is improving from 15.8 to 12.8. -Source is likely her thigh AV graft. -She is not complaining of back pain  to make me suspicious for diskitis/epidural abscess. -Await final cx data. Keep on vanc for now. -May benefit from ID consultation.  ESRD -HD TTS. -Renal has been contacted for her HD today.  AFib -INR supratherapeutic. -Coumadin on hold (pharmacy  dosing).  Disposition -Transfer to tele (pref [id number]) today.   Time spent coordinating care: 35 minutes.   LOS: 1 day   [facility] Pager: [phone] 05/29/2012, 1:26 PM

## 2012-05-29 NOTE — Progress Notes (Signed)
Utilization review completed.  

## 2012-05-30 DIAGNOSIS — F0390 Unspecified dementia without behavioral disturbance: Secondary | ICD-10-CM

## 2012-05-30 DIAGNOSIS — D638 Anemia in other chronic diseases classified elsewhere: Secondary | ICD-10-CM

## 2012-05-30 LAB — GLUCOSE, CAPILLARY
Glucose-Capillary: 140 mg/dL — ABNORMAL HIGH (ref 70–99)
Glucose-Capillary: 98 mg/dL (ref 70–99)

## 2012-05-30 MED ORDER — DARBEPOETIN ALFA-POLYSORBATE 100 MCG/0.5ML IJ SOLN: 100.0000 ug | INTRAMUSCULAR | Status: DC

## 2012-05-30 MED ORDER — DOXERCALCIFEROL 4 MCG/2ML IV SOLN
1.0000 ug | INTRAVENOUS | Status: DC
Start: 2012-05-31 — End: 2012-06-03
  Administered 2012-05-31: 1 ug via INTRAVENOUS

## 2012-05-30 NOTE — Progress Notes (Signed)
ANTICOAGULATION & ANTIBIOTIC CONSULT NOTE - Follow Up Consult  Pharmacy Consult for Coumadin, Vancomycin Indication: atrial fibrillation and bacteremia  Allergies  Allergen Reactions  . Aspirin     Unknown    . Heparin     SHE is HIT neg; platelets have been normal on low dose heparin at out pt dialysis ; not clear why this is listed   . Minoxidil     Unknown    . Penicillins     Unknown      Patient Measurements: Height: 5\' 5"  (165.1 cm) Weight: 147 lb 14.9 oz (67.1 kg) (bed scale) IBW/kg (Calculated) : 57  Vital Signs: Temp: 98.4 F (36.9 C) (03/21 1034) Temp src: Oral (03/21 1034) BP: 124/79 mmHg (03/21 1034) Pulse Rate: 64 (03/21 1034)  Labs:  Recent Labs  05/28/12 1724 05/29/12 0330 05/30/12 0430  HGB 10.3* 9.4*  --   HCT 32.4* 29.1*  --   PLT 134* 127*  --   LABPROT 38.6* 42.7* 35.5*  INR 4.30* 4.93* 3.84*  CREATININE 4.90* 5.61*  --    ESRD - usual TTS hemodialysis  Assessment:   INR remains supratherapeutic, but trending down.  No bleeding noted.  Home Coumadin dose: 5 mg daily. Last taken 3/18.    Day # 3 Vancomycin.  Gram + cocci in blood cultures. Awaiting final report.  Zosyn 2.25 gm IV given 3/20 ~2am, then discontinued 3/20 pm.  Discussed briefly with Dr. Luciana Axe. He does not wish to resume Zosyn. Expecting 4-6 weeks of antibiotics.  Goal of Therapy:  INR 2-3 Monitor platelets by anticoagulation protocol: Yes Pre-dialysis Vancomycin levels 15-25 mcg/ml  Plan:   No Coumadin again today.  Continue daily PT/INR.   Continue Vancomycin 750 mg IV after each HD. Second maintenance dose due 3/22.   Follow up culture date and antibiotic plans.  With expected long course of Vancomycin, will plan to check pre-HD vancomycin level next week.  Dennie Fetters, [place] Pager: [phone] 05/30/2012,4:28 PM

## 2012-05-30 NOTE — Progress Notes (Signed)
[facility]             Progress Note   Subjective: No acute events. Awake, eating breakfast, knows she is in the hospital, no new complaints.  Objective: Vital signs in last 24 hours: Temp:  [97.5 F (36.4 C)-98.8 F (37.1 C)] 98.4 F (36.9 C) (03/21 1034) Pulse Rate:  [61-82] 64 (03/21 1034) Resp:  [18-28] 24 (03/21 1034) BP: (95-126)/(47-79) 124/79 mmHg (03/21 1034) SpO2:  [89 %-99 %] 89 % (03/21 1034) Weight:  [67.1 kg (147 lb 14.9 oz)-67.3 kg (148 lb 5.9 oz)] 67.1 kg (147 lb 14.9 oz) (03/20 2332) Weight change: 0.9 kg (1 lb 15.7 oz) Last BM Date: 05/28/12  Intake/Output from previous day: 03/20 0701 - 03/21 0700 In: 483 [P.O.:480; I.V.:3] Out: 0      Physical Exam: General: oriented x 2. HEENT: No bruits, no goiter. Heart: Regular rate and rhythm, without murmurs, rubs, gallops. Lungs: Clear to auscultation bilaterally. Abdomen: Soft, nontender, nondistended, positive bowel sounds. Extremities: No clubbing cyanosis or edema with positive pedal pulses. Left thigh AV graft without edema, tenderness or drainage noted.    Lab Results: Basic Metabolic Panel:  Recent Labs  78/46/96 1724 05/29/12 0330  NA 139 140  K 3.8 3.7  CL 92* 96  CO2 32 30  GLUCOSE 189* 166*  BUN 37* 41*  CREATININE 4.90* 5.61*  CALCIUM 9.5 9.3   Liver Function Tests:  Recent Labs  05/28/12 1724  AST 64*  ALT 38*  ALKPHOS 217*  BILITOT 0.5  PROT 8.6*  ALBUMIN 2.9*   CBC:  Recent Labs  05/28/12 1724 05/29/12 0330  WBC 15.8* 12.8*  NEUTROABS 14.7*  --   HGB 10.3* 9.4*  HCT 32.4* 29.1*  MCV 85.7 84.1  PLT 134* 127*   CBG:  Recent Labs  05/29/12 1241 05/29/12 1625 05/30/12 0103 05/30/12 0554 05/30/12 0759 05/30/12 0851  GLUCAP 170* 193* 115* 140* 59* 98   Coagulation:  Recent Labs  05/29/12 0330 05/30/12 0430  LABPROT 42.7* 35.5*  INR 4.93* 3.84*    Recent Results (from the past 240 hour(s))  CULTURE, BLOOD (ROUTINE X 2)     Status:  None   Collection Time    05/28/12  5:35 PM      Result Value Range Status   Specimen Description BLOOD RIGHT ARM   Final   Special Requests BOTTLES DRAWN AEROBIC AND ANAEROBIC 3CC   Final   Culture  Setup Time 05/29/2012 02:43   Final   Culture     Final   Value: GRAM POSITIVE COCCI IN PAIRS AND CHAINS     Note: Gram Stain Report Called to,Read Back By and Verified With: HOLLY MATTHEWS @ 1228 05/29/12 BY KRAWS   Report Status PENDING   Incomplete  CULTURE, BLOOD (ROUTINE X 2)     Status: None   Collection Time    05/28/12  5:45 PM      Result Value Range Status   Specimen Description BLOOD RIGHT HAND   Final   Special Requests BOTTLES DRAWN AEROBIC AND ANAEROBIC 3CC   Final   Culture  Setup Time 05/29/2012 02:43   Final   Culture     Final   Value: GRAM POSITIVE COCCI IN PAIRS AND CHAINS     Note: Gram Stain Report Called to,Read Back By and Verified With: HOLLY MATTHEWS @ 1228 05/29/12 BY KRAWS   Report Status PENDING   Incomplete  MRSA PCR SCREENING     Status: None  Collection Time    05/29/12  1:06 AM      Result Value Range Status   MRSA by PCR NEGATIVE  NEGATIVE Final   Comment:            The GeneXpert MRSA Assay (FDA     approved for NASAL specimens     only), is one component of a     comprehensive MRSA colonization     surveillance program. It is not     intended to diagnose MRSA     infection nor to guide or     monitor treatment for     MRSA infections.    Studies/Results: Dg Chest 2 View  05/28/2012  *RADIOLOGY REPORT*  Clinical Data: Chest pain.  CHEST - 2 VIEW  Comparison: 03/29/2012.  Findings: The heart is enlarged but stable.  There is tortuosity and calcification of the thoracic aorta.  Low lung volumes with vascular crowding and streaky bibasilar atelectasis.  There is also vascular congestion without overt pulmonary edema.  The bony thorax is intact.  IMPRESSION: Stable cardiac enlargement. Vascular congestion without overt pulmonary edema.   Original  Report Authenticated By: Rudie Meyer, M.D.    Dg Wrist 2 Views Left  05/29/2012  *RADIOLOGY REPORT*  Clinical Data: Pain  LEFT WRIST - 2 VIEW  Comparison: None.  Findings: The study is limited by difficulty with positioning.  The wrist is in flexion.  There is a probable small fracture of the dorsal cortex of the distal ulna.  This is seen only on the lateral view.  IMPRESSION: Limited study.  There is a probable small fracture of the dorsal cortex of the distal ulna.  Further imaging is suggested to confirm.   Original Report Authenticated By: Janeece Riggers, M.D.     Medications: Scheduled Meds: . [START ON 05/31/2012] darbepoetin (ARANESP) injection - DIALYSIS  100 mcg Intravenous Q Sat-HD  . docusate sodium  100 mg Oral BID  . [START ON 05/31/2012] doxercalciferol  1 mcg Intravenous Q T,Th,Sa-HD  . DULoxetine  20 mg Oral QAC breakfast  . feeding supplement (NEPRO CARB STEADY)  237 mL Oral BID BM  . feeding supplement  60 mL Oral BID  . insulin aspart  0-5 Units Subcutaneous QHS  . insulin aspart  0-9 Units Subcutaneous TID WC  . insulin glargine  10 Units Subcutaneous QHS  . metoprolol tartrate  25 mg Oral Q T,Th,S,Su  . metoprolol tartrate  25 mg Oral Custom  . multivitamin  1 tablet Oral QHS  . pantoprazole  40 mg Oral Daily  . simvastatin  20 mg Oral QHS  . sodium chloride  3 mL Intravenous Q12H  . vancomycin  750 mg Intravenous Q T,Th,Sa-HD  . Warfarin - Pharmacist Dosing Inpatient   Does not apply q1800   Continuous Infusions:  PRN Meds:.sodium chloride, acetaminophen, acetaminophen, albuterol, ipratropium, ondansetron (ZOFRAN) IV, ondansetron, oxyCODONE-acetaminophen, sodium chloride  Assessment/Plan:  Active Problems:   DIABETES MELLITUS, TYPE II, ON INSULIN   ANEMIA OF CHRONIC DISEASE   DEMENTIA, MILD   Atrial fibrillation   End stage renal disease   Bacteremia   Supratherapeutic INR   Sepsis   GPC Bacteremia/Sepsis -Patient is receiving vancomycin. -Leukocytosis  is improving  -Source is possibly her thigh AV graft, although there does not appear to be any erythema, edema or drainage noted from it. -She is not complaining of back pain  to make me suspicious for diskitis/epidural abscess. -Await final cx data. Keep on vanc for now. -  2D Echo does not show any evidence of vegetations -Chest xray does not show any evidence of pneumonia -Requested ID consult, Dr. Luciana Axe.  ESRD -HD TTS. -nephrology following.  AFib -INR supratherapeutic. -Coumadin on hold (pharmacy dosing).  Disposition Return to SNF when stable.  Anemia of chronic disease -stable  Diabetes Mellitus -continue to monitor blood sugars, on sliding scale insulin and lantus    Time spent coordinating care: 35 minutes.   LOS: 2 days   MEMON,JEHANZEB [facility] Pager: [phone] 05/30/2012, 10:37 AM

## 2012-05-30 NOTE — Consult Note (Addendum)
[facility]     Reason for Consult: bacteremia    Referring Physician: Dr. Kerry Hough  Active Problems:   DIABETES MELLITUS, TYPE II, ON INSULIN   ANEMIA OF CHRONIC DISEASE   DEMENTIA, MILD   Atrial fibrillation   End stage renal disease   Bacteremia   Supratherapeutic INR   Sepsis   . [START ON 05/31/2012] darbepoetin (ARANESP) injection - DIALYSIS  100 mcg Intravenous Q Sat-HD  . docusate sodium  100 mg Oral BID  . [START ON 05/31/2012] doxercalciferol  1 mcg Intravenous Q T,Th,Sa-HD  . DULoxetine  20 mg Oral QAC breakfast  . feeding supplement (NEPRO CARB STEADY)  237 mL Oral BID BM  . feeding supplement  60 mL Oral BID  . insulin aspart  0-5 Units Subcutaneous QHS  . insulin aspart  0-9 Units Subcutaneous TID WC  . insulin glargine  10 Units Subcutaneous QHS  . metoprolol tartrate  25 mg Oral Q T,Th,S,Su  . metoprolol tartrate  25 mg Oral Custom  . multivitamin  1 tablet Oral QHS  . pantoprazole  40 mg Oral Daily  . simvastatin  20 mg Oral QHS  . sodium chloride  3 mL Intravenous Q12H  . vancomycin  750 mg Intravenous Q T,Th,Sa-HD  . Warfarin - Pharmacist Dosing Inpatient   Does not apply q1800    Recommendations: Continue with vancomycin pending ID of organism  Would consider a prolonged course of IV antibiotics of 4-6 weeks with fistula Will repeat blood cultures  Assessment: She has bacteremia with GPC in pairs and chains, possible Enterococcus, less likely strep or staph.   Awaiting ID.  Her fistula in left thigh does not look infected and is not causing any discomfort.  It is hard to rule out as a source of infection or that it is not now infected but at this time, I do not see a reason to consider removal.  For her bacteremia, she is improving.   Aortic valve on TTE is thickened and past echo did show sclerosis, but no obvious vegetation.    Antibiotics: Vancomycin day 3 Zosyn once levaquin x 1 day  HPI: NELLIE PESTER is a 79  y.o. female NH resident with ESRD on dialysis and with a left thigh fistula who presented initially to her dialysis unit and was complaining of weakness and sleepiness and noted to have a fever.  She was diagnosed with pneumonia and started on Avelox but no cough or SOB.  Blood cultures were positive for GPC, though [facility] does not have any report of it.  Blood cultures here are positive for GPC in pairs and chains. She has had a recent bacteremia with   E coli and recent wound infection of fistula (right thigh) with Klebsiella that was removed.  Also MRSA infection of right arm graft in 2012 and left in 2012 and multiple previous infections with MRSA.     Review of Systems: Pertinent items are noted in HPI.  Past Medical History  Diagnosis Date  . Atrial fibrillation   . CAD (coronary artery disease)   . Hypertension   . Anemia   . MRSA bacteremia     hx  . Cellulitis   . GERD (gastroesophageal reflux disease)   . Acetabulum fracture   . Contracture of elbow     L arm  . Dementia   . Diabetes mellitus     on Insulin  . Decubitus ulcer, buttock 02/07/2011  Pt presents with dressing to R buttocks/coccyx.  . Pneumonia   . Blood transfusion   . Stroke ~ 1990's    Left side paralysis.  Left  foot drop., L arm contracted.  . Chronic kidney disease 03/13/11    dialysis at [facility]; TTS,last tx 03/12/11 hemo x 20years  . Depression   . Hyperlipidemia     History  Substance Use Topics  . Smoking status: Former Smoker -- 0.25 packs/day for 30 years    Types: Cigarettes    Quit date: 03/12/2000  . Smokeless tobacco: Never Used  . Alcohol Use: No     Comment: "last alcohol ~ 1990"    Family History  Problem Relation Age of Onset  . Diabetes Mother   . Hypertension Father   . Anesthesia problems Neg Hx   . Hypotension Neg Hx   . Malignant hyperthermia Neg Hx   . Pseudochol deficiency Neg Hx    Allergies  Allergen Reactions  . Aspirin     Unknown    . Heparin      SHE is HIT neg; platelets have been normal on low dose heparin at out pt dialysis ; not clear why this is listed   . Minoxidil     Unknown    . Penicillins     Unknown      OBJECTIVE: Blood pressure 124/79, pulse 64, temperature 98.4 F (36.9 C), temperature source Oral, resp. rate 24, height 5\' 5"  (1.651 m), weight 147 lb 14.9 oz (67.1 kg), SpO2 89.00%. General: Awake, alert, nad Skin: graft site in left thigh with no tenderness, no erythema, no edema Lungs: CTA B Cor: RRR without m/r/g Abdomen: Soft, ntnd, +bs   Microbiology: Recent Results (from the past 240 hour(s))  CULTURE, BLOOD (ROUTINE X 2)     Status: None   Collection Time    05/28/12  5:35 PM      Result Value Range Status   Specimen Description BLOOD RIGHT ARM   Final   Special Requests BOTTLES DRAWN AEROBIC AND ANAEROBIC 3CC   Final   Culture  Setup Time 05/29/2012 02:43   Final   Culture     Final   Value: GRAM POSITIVE COCCI IN PAIRS AND CHAINS     Note: Gram Stain Report Called to,Read Back By and Verified With: HOLLY MATTHEWS @ 1228 05/29/12 BY KRAWS   Report Status PENDING   Incomplete  CULTURE, BLOOD (ROUTINE X 2)     Status: None   Collection Time    05/28/12  5:45 PM      Result Value Range Status   Specimen Description BLOOD RIGHT HAND   Final   Special Requests BOTTLES DRAWN AEROBIC AND ANAEROBIC 3CC   Final   Culture  Setup Time 05/29/2012 02:43   Final   Culture     Final   Value: GRAM POSITIVE COCCI IN PAIRS AND CHAINS     Note: Gram Stain Report Called to,Read Back By and Verified With: HOLLY MATTHEWS @ 1228 05/29/12 BY KRAWS   Report Status PENDING   Incomplete  MRSA PCR SCREENING     Status: None   Collection Time    05/29/12  1:06 AM      Result Value Range Status   MRSA by PCR NEGATIVE  NEGATIVE Final   Comment:            The GeneXpert MRSA Assay (FDA     approved for NASAL specimens     only),  is one component of a     comprehensive MRSA colonization     surveillance program. It is  not     intended to diagnose MRSA     infection nor to guide or     monitor treatment for     MRSA infections.    Staci Righter, MD [facility] [phone] pager  [phone] cell 05/30/2012, 2:57 PM

## 2012-05-30 NOTE — Progress Notes (Signed)
Subjective: No current complaints, no dyspnea, no fever or chills, although believes she has been at hospital for one week.  Objective: Vital signs in last 24 hours: Temp:  [97.5 F (36.4 C)-98.8 F (37.1 C)] 97.5 F (36.4 C) (03/21 0551) Pulse Rate:  [61-82] 64 (03/21 0551) Resp:  [18-28] 23 (03/21 0551) BP: (95-126)/(47-75) 122/47 mmHg (03/21 0551) SpO2:  [89 %-100 %] 96 % (03/21 0551) Weight:  [67.1 kg (147 lb 14.9 oz)-67.3 kg (148 lb 5.9 oz)] 67.1 kg (147 lb 14.9 oz) (03/20 2332) Weight change: 0.9 kg (1 lb 15.7 oz)  Intake/Output from previous day: 03/20 0701 - 03/21 0700 In: 483 [P.O.:480; I.V.:3] Out: 0    EXAM: General appearance:  Alert, pleasant, in no apparent distress Resp:  CTA without rales, rhonchi, or wheezes Cardio:  Irregular rhythm, Gr II/VI systolic murmur, no rub GI:  + BS, soft and nontender Extremities:  Trace edema Access:  Left thigh AVG with + bruit  Lab Results:  Recent Labs  05/28/12 1724 05/29/12 0330  WBC 15.8* 12.8*  HGB 10.3* 9.4*  HCT 32.4* 29.1*  PLT 134* 127*   BMET:  Recent Labs  05/28/12 1724 05/29/12 0330  NA 139 140  K 3.8 3.7  CL 92* 96  CO2 32 30  GLUCOSE 189* 166*  BUN 37* 41*  CREATININE 4.90* 5.61*  CALCIUM 9.5 9.3  ALBUMIN 2.9*  --    No results found for this basename: PTH,  in the last 72 hours Iron Studies: No results found for this basename: IRON, TIBC, TRANSFERRIN, FERRITIN,  in the last 72 hours  Dialysis Orders: Center: [facility] on TTS .  EDW 66.5 kg HD Bath 2k, 2.25 ca Time 4hrs Heparin 0. Access left femoral avgg BFR 400 DFR 800  Hectorol 1 mcg IV/HD Epogen 0 Units IV/HD Venofer 0   Assessment/Plan: 1. Sepsis - SNF blood cultures with Gram + cocci, now afebrile; on Vancomycin per admit. 2. ESRD - HD on TTS @ [facility]; last K 3.7.  Next HD tomorrow. 3. Atrial fib - on Metoprolol and Coumadin; high INR 3.84, per admit. 4. Anemia - Hgb down to 9.4, start Aranesp tomorrow. 5. Hypertension/Volume - BP 122/47 on  Metoprolol 25 mg qd; wt 67.1 kg s/p HD yesterday (EDW 66.5).  6. Secondary hyperparathyroidism - Ca 9.3 (10.2 corrected), P 4; Hectorol 1 mcg. 7. Nutrition - Alb 2.9, renal vitamin, high protein/carb mod diet. 8. 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 9. DM Type 2 - on Insulin per admit. 10. Hx dementia and CVA - with left hemiparesis/ left upper extremity contracture.   LOS: 2 days   LYLES,CHARLES 05/30/2012,7:20 AM  Attending: Source of G pos cocci bacteremia uncertain but always worried about endovascular graft infection.  Await ID and sensitivities.  For HD Saturday. Kymber Kosar C

## 2012-05-31 ENCOUNTER — Inpatient Hospital Stay (HOSPITAL_COMMUNITY): Payer: Medicare Other

## 2012-05-31 DIAGNOSIS — E119 Type 2 diabetes mellitus without complications: Secondary | ICD-10-CM

## 2012-05-31 LAB — RENAL FUNCTION PANEL
CO2: 30 mEq/L (ref 19–32)
CO2: 28 mEq/L (ref 19–32)
Calcium: 8.8 mg/dL (ref 8.4–10.5)
Chloride: 98 mEq/L (ref 96–112)
Creatinine, Ser: 4.7 mg/dL — ABNORMAL HIGH (ref 0.50–1.10)
Creatinine, Ser: 1.59 mg/dL — ABNORMAL HIGH (ref 0.50–1.10)
GFR calc Af Amer: 9 mL/min — ABNORMAL LOW (ref >90)
GFR calc Af Amer: 34 mL/min — ABNORMAL LOW (ref >90)
GFR calc non Af Amer: 30 mL/min — ABNORMAL LOW (ref >90)
Glucose, Bld: 102 mg/dL — ABNORMAL HIGH (ref 70–99)
Potassium: 3.5 mEq/L (ref 3.5–5.1)
Sodium: 138 mEq/L (ref 135–145)

## 2012-05-31 LAB — CBC
Hemoglobin: 8.9 g/dL — ABNORMAL LOW (ref 12.0–15.0)
MCH: 27.1 pg (ref 26.0–34.0)
MCV: 84.1 fL (ref 78.0–100.0)
RBC: 3.28 MIL/uL — ABNORMAL LOW (ref 3.87–5.11)

## 2012-05-31 LAB — GLUCOSE, CAPILLARY
Glucose-Capillary: 181 mg/dL — ABNORMAL HIGH (ref 70–99)
Glucose-Capillary: 93 mg/dL (ref 70–99)
Glucose-Capillary: 262 mg/dL — ABNORMAL HIGH (ref 70–99)

## 2012-05-31 MED ORDER — PENTAFLUOROPROP-TETRAFLUOROETH EX AERO: 1.0000 "application " | INHALATION_SPRAY | CUTANEOUS | Status: DC | PRN

## 2012-05-31 MED ORDER — DARBEPOETIN ALFA-POLYSORBATE 200 MCG/0.4ML IJ SOLN
INTRAMUSCULAR | Status: AC
  Filled 2012-05-31: qty 0.4

## 2012-05-31 MED ORDER — SODIUM CHLORIDE 0.9 % IV SOLN: 100.0000 mL | INTRAVENOUS | Status: DC | PRN

## 2012-05-31 MED ORDER — OXYCODONE HCL ER 10 MG PO T12A
10.0000 mg | EXTENDED_RELEASE_TABLET | Freq: Two times a day (BID) | ORAL | Status: DC
  Administered 2012-05-31 – 2012-06-02 (×5): 10 mg via ORAL
  Filled 2012-05-31 (×5): qty 1

## 2012-05-31 MED ORDER — HEPARIN SODIUM (PORCINE) 1000 UNIT/ML DIALYSIS: 1000.0000 [IU] | INTRAMUSCULAR | Status: DC | PRN

## 2012-05-31 MED ORDER — NEPRO/CARBSTEADY PO LIQD: 237.0000 mL | ORAL | Status: DC | PRN

## 2012-05-31 MED ORDER — LIDOCAINE HCL (PF) 1 % IJ SOLN: 5.0000 mL | INTRAMUSCULAR | Status: DC | PRN

## 2012-05-31 MED ORDER — PENTAFLUOROPROP-TETRAFLUOROETH EX AERO
INHALATION_SPRAY | CUTANEOUS | Status: AC
  Filled 2012-05-31: qty 103.5

## 2012-05-31 MED ORDER — LIDOCAINE-PRILOCAINE 2.5-2.5 % EX CREA: 1.0000 "application " | TOPICAL_CREAM | CUTANEOUS | Status: DC | PRN

## 2012-05-31 MED ORDER — DARBEPOETIN ALFA-POLYSORBATE 200 MCG/0.4ML IJ SOLN
200.0000 ug | INTRAMUSCULAR | Status: DC
  Administered 2012-05-31: 200 ug via INTRAVENOUS

## 2012-05-31 MED ORDER — OXYCODONE-ACETAMINOPHEN 5-325 MG PO TABS
ORAL_TABLET | ORAL | Status: AC
  Filled 2012-05-31: qty 2

## 2012-05-31 MED ORDER — WARFARIN SODIUM 2 MG PO TABS
2.0000 mg | ORAL_TABLET | Freq: Once | ORAL | Status: AC
  Administered 2012-05-31: 2 mg via ORAL
  Filled 2012-05-31: qty 1

## 2012-05-31 MED ORDER — DOXERCALCIFEROL 4 MCG/2ML IV SOLN
INTRAVENOUS | Status: AC
  Filled 2012-05-31: qty 2

## 2012-05-31 NOTE — Progress Notes (Signed)
[facility]             Progress Note   Subjective: Patient seen, complains of left wrist pain.  Objective: Vital signs in last 24 hours: Temp:  [96.3 F (35.7 C)-99.3 F (37.4 C)] 98.5 F (36.9 C) (03/22 1140) Pulse Rate:  [53-68] 53 (03/22 1140) Resp:  [16-24] 20 (03/22 1140) BP: (99-130)/(37-65) 99/37 mmHg (03/22 1140) SpO2:  [90 %-100 %] 95 % (03/22 1140) Weight:  [66.6 kg (146 lb 13.2 oz)-67.8 kg (149 lb 7.6 oz)] 66.6 kg (146 lb 13.2 oz) (03/22 1101) Weight change: 0.5 kg (1 lb 1.6 oz) Last BM Date: 05/30/12  Intake/Output from previous day:   Total I/O In: -  Out: 800 [Other:800]   Physical Exam: General: oriented x 2. HEENT: No bruits, no goiter. Heart: Regular rate and rhythm, without murmurs, rubs, gallops. Lungs: Clear to auscultation bilaterally. Abdomen: Soft, nontender, nondistended, positive bowel sounds. Extremities: left wrist flexion deformity, positive tender to palpation, no erythema  Lab Results: Basic Metabolic Panel:  Recent Labs  16/10/96 0700 05/31/12 1120  NA 138 139  K 4.5 3.5  CL 98 98  CO2 30 28  GLUCOSE 102* 97  BUN 46* 10  CREATININE 4.70* 1.59*  CALCIUM 8.8 8.3*  PHOS 2.1* 1.2*   Liver Function Tests:  Recent Labs  05/28/12 1724 05/31/12 0700 05/31/12 1120  AST 64*  --   --   ALT 38*  --   --   ALKPHOS 217*  --   --   BILITOT 0.5  --   --   PROT 8.6*  --   --   ALBUMIN 2.9* 2.5* 2.7*   CBC:  Recent Labs  05/28/12 1724 05/29/12 0330 05/31/12 0700  WBC 15.8* 12.8* 7.0  NEUTROABS 14.7*  --   --   HGB 10.3* 9.4* 8.9*  HCT 32.4* 29.1* 27.6*  MCV 85.7 84.1 84.1  PLT 134* 127* 158   CBG:  Recent Labs  05/30/12 0759 05/30/12 0851 05/30/12 1153 05/30/12 1621 05/30/12 2140 05/31/12 1135  GLUCAP 59* 98 159* 181* 131* 93   Coagulation:  Recent Labs  05/30/12 0430 05/31/12 0745  LABPROT 35.5* 30.6*  INR 3.84* 3.14*    Recent Results (from the past 240 hour(s))  CULTURE, BLOOD  (ROUTINE X 2)     Status: None   Collection Time    05/28/12  5:35 PM      Result Value Range Status   Specimen Description BLOOD RIGHT ARM   Final   Special Requests BOTTLES DRAWN AEROBIC AND ANAEROBIC 3CC   Final   Culture  Setup Time 05/29/2012 02:43   Final   Culture     Final   Value: STREPTOCOCCUS SPECIES     Note: Gram Stain Report Called to,Read Back By and Verified With: HOLLY MATTHEWS @ 1228 05/29/12 BY KRAWS   Report Status PENDING   Incomplete  CULTURE, BLOOD (ROUTINE X 2)     Status: None   Collection Time    05/28/12  5:45 PM      Result Value Range Status   Specimen Description BLOOD RIGHT HAND   Final   Special Requests BOTTLES DRAWN AEROBIC AND ANAEROBIC 3CC   Final   Culture  Setup Time 05/29/2012 02:43   Final   Culture     Final   Value: STREPTOCOCCUS SPECIES     Note: Gram Stain Report Called to,Read Back By and Verified With: HOLLY MATTHEWS @ 1228 05/29/12 BY KRAWS  Report Status PENDING   Incomplete  MRSA PCR SCREENING     Status: None   Collection Time    05/29/12  1:06 AM      Result Value Range Status   MRSA by PCR NEGATIVE  NEGATIVE Final   Comment:            The GeneXpert MRSA Assay (FDA     approved for NASAL specimens     only), is one component of a     comprehensive MRSA colonization     surveillance program. It is not     intended to diagnose MRSA     infection nor to guide or     monitor treatment for     MRSA infections.    Studies/Results: No results found.  Medications: Scheduled Meds: . darbepoetin      . darbepoetin (ARANESP) injection - DIALYSIS  200 mcg Intravenous Q Sat-HD  . docusate sodium  100 mg Oral BID  . doxercalciferol      . doxercalciferol  1 mcg Intravenous Q T,Th,Sa-HD  . DULoxetine  20 mg Oral QAC breakfast  . feeding supplement (NEPRO CARB STEADY)  237 mL Oral BID BM  . feeding supplement  60 mL Oral BID  . insulin aspart  0-5 Units Subcutaneous QHS  . insulin aspart  0-9 Units Subcutaneous TID WC  . insulin  glargine  10 Units Subcutaneous QHS  . metoprolol tartrate  25 mg Oral Q T,Th,S,Su  . metoprolol tartrate  25 mg Oral Custom  . multivitamin  1 tablet Oral QHS  . OxyCODONE  10 mg Oral Q12H  . oxyCODONE-acetaminophen      . pantoprazole  40 mg Oral Daily  . pentafluoroprop-tetrafluoroeth      . simvastatin  20 mg Oral QHS  . sodium chloride  3 mL Intravenous Q12H  . vancomycin  750 mg Intravenous Q T,Th,Sa-HD  . warfarin  2 mg Oral ONCE-1800  . Warfarin - Pharmacist Dosing Inpatient   Does not apply q1800   Continuous Infusions:  PRN Meds:.sodium chloride, acetaminophen, acetaminophen, albuterol, ipratropium, ondansetron (ZOFRAN) IV, ondansetron, oxyCODONE-acetaminophen, sodium chloride  Assessment/Plan:  Active Problems:   DIABETES MELLITUS, TYPE II, ON INSULIN   ANEMIA OF CHRONIC DISEASE   DEMENTIA, MILD   Atrial fibrillation   End stage renal disease   Bacteremia   Supratherapeutic INR   Sepsis   GPC Bacteremia/Sepsis -Patient is receiving vancomycin. -Leukocytosis is improving  -Source is possibly her thigh AV graft, although there does not appear to be any erythema, edema or drainage noted from it. -She is not complaining of back pain  to make me suspicious for diskitis/epidural abscess. -Await final cx data. Keep on vanc for now. -2D Echo does not show any evidence of vegetations -Chest xray does not show any evidence of pneumonia -Requested ID consult, Dr. Luciana Axe.  ESRD -HD TTS. -nephrology following.  AFib -INR supratherapeutic. -Coumadin on hold (pharmacy dosing).  Disposition Return to SNF when stable.  Anemia of chronic disease -stable  Diabetes Mellitus -continue to monitor blood sugars, on sliding scale insulin and lantus  Left wrist pain -patent has been staking oxycodone prn q 4 hrs, will  Obtain Xray of left wrist, and start OxyContin 10 mg po q 12r  Time spent coordinating care: 35 minutes.   LOS: 3 days   [facility] Pager: [phone] 05/31/2012, 1:13 PM

## 2012-05-31 NOTE — Progress Notes (Signed)
ANTICOAGULATION & ANTIBIOTIC CONSULT NOTE - Follow Up Consult  Pharmacy Consult for Coumadin, Vancomycin Indication: atrial fibrillation and bacteremia  Allergies  Allergen Reactions  . Aspirin     Unknown    . Heparin     SHE is HIT neg; platelets have been normal on low dose heparin at out pt dialysis ; not clear why this is listed   . Minoxidil     Unknown    . Penicillins     Unknown      Patient Measurements: Height: 5\' 5"  (165.1 cm) Weight: 146 lb 13.2 oz (66.6 kg) IBW/kg (Calculated) : 57  Vital Signs: Temp: 96.3 F (35.7 C) (03/22 1101) Temp src: Oral (03/22 1101) BP: 117/56 mmHg (03/22 1101) Pulse Rate: 63 (03/22 1101)  Labs:  Recent Labs  05/28/12 1724 05/29/12 0330 05/30/12 0430 05/31/12 0700 05/31/12 0745  HGB 10.3* 9.4*  --  8.9*  --   HCT 32.4* 29.1*  --  27.6*  --   PLT 134* 127*  --  158  --   LABPROT 38.6* 42.7* 35.5*  --  30.6*  INR 4.30* 4.93* 3.84*  --  3.14*  CREATININE 4.90* 5.61*  --  4.70*  --    ESRD - usual TTS hemodialysis  Assessment: 79yo F admitted 05/28/2012 with positive blood cultures from from [facility].  Pharmacy consulted to dose Vanc, & Coumadin  Events  Coag: Afib. INR remains above 3, Of note, pt with heparin allergy, hx HIT. Home dose: 5 mg daily, last dose 3/18 at SNF.   No bleeding noted.    Infectious Disease: GPC in BCx from NH. Repeat [facility]x drawn at [facility] pending. Pt was being treated with Avelox PTA for PNA. Also with hx MRSA bacteremia. Hypothermic 96.3, WBC 7.0 on 3/22.  Plan 4-6 wks abx.  Avelox (PTA-3/18) >> 3/19 Vanc 3/19 >> Levaquin 3/19 >>3/19 (1 dose) Zoysn 3/20>>3/20 (1 dose)  PTA BCx (NH) >> GPC  3/19 BCx >> 2/2 GPC - streptococus sp  Cardiovascular: Hx Afib/CAD/HTN/DL.  metop, simva. TTE showed thicked AoValve - no obvious veg;  VSS Endocrinology: DM. No A1c/TSH this admit. CBGs < 200 in last 24h On lantus 10 units qhs, sensitive SSI ac/hs.  Gastrointestinal / Nutrition: Hx GERD/prior GIB. LFTs 64/38.  Carb-modified diet. On colace, PPI po, renal vit Neurology: Hx dementia/CVA/depression. On cymbalta Nephrology: Hx ESRD-TThSat. Hectorol w/ HD Hematology / Oncology:Anemia Hgb 8.9 3/22. PLTC 158. Watch on Coumadin. PLTC was 199-221 in Jan 2014. Aranesp 100 mcg due 3/22 with HD PTA Medication Issues: norvasc, combivent, avelox (abx chg'd inpt) Best Practices: Warfarin, PPI po (sepsis)   Goal of Therapy:  INR 2-3 Monitor platelets by anticoagulation protocol: Yes Pre-dialysis Vancomycin levels 15-25 mcg/ml  Plan:  Resume small dose of warfarin today, 2 mg. After 3 day hold  Continue daily PT/INR.   Continue Vancomycin 750 mg IV after each HD. Second maintenance dose due 3/22.   Follow up culture date and antibiotic plans.  With expected long course of Vancomycin, will plan to check pre-HD vancomycin level next week.   Thank you for allowing pharmacy to be a part of this patients care team.  Lovenia Kim Pharm.D., BCPS Clinical Pharmacist 05/31/2012 11:22 AM Pager: [phone] Phone: [phone]

## 2012-05-31 NOTE — Procedures (Signed)
I was present at this dialysis session. I have reviewed the session itself and made appropriate changes.   Rob Sheretha Shadd, MD [facility] 05/31/2012, 9:36 AM                         

## 2012-05-31 NOTE — Progress Notes (Signed)
Subjective:   Seen on dialysis, no complaints.  Objective: Vital signs in last 24 hours: Temp:  [98.4 F (36.9 C)-99.3 F (37.4 C)] 99.1 F (37.3 C) (03/22 0650) Pulse Rate:  [57-68] 57 (03/22 0700) Resp:  [20-24] 20 (03/22 0700) BP: (116-130)/(46-79) 116/54 mmHg (03/22 0700) SpO2:  [89 %-100 %] 100 % (03/22 0650) Weight:  [67.3 kg (148 lb 5.9 oz)-67.8 kg (149 lb 7.6 oz)] 67.3 kg (148 lb 5.9 oz) (03/22 0650) Weight change: 0.5 kg (1 lb 1.6 oz)     EXAM: General appearance:  Alert, in no apparent distress Resp:  CTA without rales, rhonchi, or wheezes Cardio:  Irregular rhythm, Gr II/VI systolic murmur, no rub GI:  + BS, soft and nontender Extremities:  No edema Access: Left thigh AVG with BFR 400 cc/min  Lab Results:  Recent Labs  05/29/12 0330 05/31/12 0700  WBC 12.8* 7.0  HGB 9.4* 8.9*  HCT 29.1* 27.6*  PLT 127* 158   BMET:  Recent Labs  05/28/12 1724 05/29/12 0330  NA 139 140  K 3.8 3.7  CL 92* 96  CO2 32 30  GLUCOSE 189* 166*  BUN 37* 41*  CREATININE 4.90* 5.61*  CALCIUM 9.5 9.3  ALBUMIN 2.9*  --    No results found for this basename: PTH,  in the last 72 hours Iron Studies: No results found for this basename: IRON, TIBC, TRANSFERRIN, FERRITIN,  in the last 72 hours  Dialysis Orders: Center: [facility] on TTS .  EDW 66.5 kg HD Bath 2k, 2.25 ca Time 4hrs Heparin 0. Access left femoral avgg BFR 400 DFR 800  Hectorol 1 mcg IV/HD Epogen 0 Units IV/HD Venofer 0   Assessment/Plan: 1. Sepsis - SNF blood cultures (and BCs 3/20) with Gram + cocci, now afebrile; on Vancomycin per admit. 2. ESRD - HD on TTS @ [facility], thigh graft; last K 3.7. Next HD tomorrow. 3. Atrial fib - on Metoprolol and Coumadin; high INR 3.84, per admit. 4. Anemia - Hgb down to 8.9.  Aranesp today. 5. Hypertension/Volume - BP 116/54 on Metoprolol 25 mg qd; wt 67.3 kg (EDW 66.5).  UF goal 0.8 L. 6. Secondary hyperparathyroidism - Ca 9.3 (10.2 corrected), P 4; Hectorol 1 mcg. 7. Nutrition - Alb 2.9,  renal vitamin, high protein/carb mod diet. 8. 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 9. DM Type 2 - on Insulin per admit. 10. Hx dementia and CVA - with left hemiparesis/ left upper extremity contracture.     LOS: 3 days   LYLES,CHARLES 05/31/2012,7:26 AM  Patient seen and examined.  Agree with assessment and plan as above. [name]  MD [phone] pgr    [phone] cell 05/31/2012, 9:49 AM

## 2012-06-01 ENCOUNTER — Encounter (HOSPITAL_COMMUNITY): Payer: Self-pay | Admitting: Nephrology

## 2012-06-01 LAB — CULTURE, BLOOD (ROUTINE X 2)

## 2012-06-01 LAB — GLUCOSE, CAPILLARY
Glucose-Capillary: 133 mg/dL — ABNORMAL HIGH (ref 70–99)
Glucose-Capillary: 101 mg/dL — ABNORMAL HIGH (ref 70–99)
Glucose-Capillary: 154 mg/dL — ABNORMAL HIGH (ref 70–99)

## 2012-06-01 LAB — URIC ACID: Uric Acid, Serum: 3.6 mg/dL (ref 2.4–7.0)

## 2012-06-01 LAB — PROCALCITONIN: Procalcitonin: 47.57 ng/mL

## 2012-06-01 MED ORDER — CEFAZOLIN SODIUM-DEXTROSE 2-3 GM-% IV SOLR
2.0000 g | INTRAVENOUS | Status: DC
  Filled 2012-06-01: qty 50

## 2012-06-01 MED ORDER — CEFAZOLIN SODIUM-DEXTROSE 2-3 GM-% IV SOLR
2.0000 g | Freq: Once | INTRAVENOUS | Status: AC
  Administered 2012-06-01: 2 g via INTRAVENOUS
  Filled 2012-06-01: qty 50

## 2012-06-01 MED ORDER — WARFARIN SODIUM 3 MG PO TABS
3.0000 mg | ORAL_TABLET | Freq: Once | ORAL | Status: AC
  Administered 2012-06-01: 3 mg via ORAL
  Filled 2012-06-01: qty 1

## 2012-06-01 NOTE — Progress Notes (Signed)
Subjective:   Becomes tearful and distressed easily; nonspecific complaints, but complains when hands are touched.  Objective: Vital signs in last 24 hours: Temp:  [96.3 F (35.7 C)-99.1 F (37.3 C)] 98.9 F (37.2 C) (03/23 0545) Pulse Rate:  [53-73] 65 (03/23 0545) Resp:  [18-24] 19 (03/23 0545) BP: (99-117)/(37-66) 117/49 mmHg (03/23 0545) SpO2:  [92 %-100 %] 100 % (03/23 0545) Weight:  [66.6 kg (146 lb 13.2 oz)] 66.6 kg (146 lb 13.2 oz) (03/22 1101) Weight change: -1.2 kg (-2 lb 10.3 oz)  Intake/Output from previous day: 03/22 0701 - 03/23 0700 In: 540 [P.O.:540] Out: 800    EXAM: General appearance:  Alert, tearful, distressed Resp:  CTA without rales, rhonchi, or wheezes Cardio:  Irregular rhythm, Gr II/VI systolic murmur, no rub GI:  + BS, soft and nontender Extremities:  No edema Access:  Left thigh AVG with + bruit  Lab Results:  Recent Labs  05/31/12 0700  WBC 7.0  HGB 8.9*  HCT 27.6*  PLT 158   BMET:  Recent Labs  05/31/12 0700 05/31/12 1120  NA 138 139  K 4.5 3.5  CL 98 98  CO2 30 28  GLUCOSE 102* 97  BUN 46* 10  CREATININE 4.70* 1.59*  CALCIUM 8.8 8.3*  ALBUMIN 2.5* 2.7*   No results found for this basename: PTH,  in the last 72 hours Iron Studies: No results found for this basename: IRON, TIBC, TRANSFERRIN, FERRITIN,  in the last 72 hours  Dialysis Orders: Center: [facility] on TTS .  EDW 66.5 kg HD Bath 2k, 2.25 ca Time 4hrs Heparin 0. Access left femoral avgg BFR 400 DFR 800  Hectorol 1 mcg IV/HD Epogen 0 Units IV/HD Venofer 0   Assessment/Plan: 1. Sepsis - SNF blood cultures with Gram + cocci, BCxs 3/19 + for Streptococcus, now afebrile; on Vancomycin per admit. No gross signs of thigh graft infection 2. ESRD - HD on TTS @ [facility], thigh graft; K 3.5 pre-HD. Next HD on 3/25.  3. Atrial fib - on Metoprolol; Coumadin on hold for high INR 4.93 on 3/20, INR 2.5 today, per admit.  4. Anemia - Hgb down to 8.9, s/p Aranesp yesterday.   5. Hypertension/Volume - BP 117/49 on Metoprolol 25 mg qd; wt 66.6 kg s/p net UF 800 ml yesterday (EDW 66.5).  6. Secondary hyperparathyroidism - Ca 8.3 (9.3 corrected), P 4; Hectorol 1 mcg.  7. Nutrition - Alb 2.7, renal vitamin, high protein/carb mod diet.  8. 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  9. DM Type 2 - on Insulin per admit.  10. Hx dementia and CVA - with left hemiparesis/ left upper extremity contracture     LOS: 4 days   LYLES,CHARLES 06/01/2012,8:57 AM  Patient seen and examined.  I agree with plan as above with additions as indicated. [name]  MD [phone] pgr    [phone] cell 06/01/2012, 2:07 PM

## 2012-06-01 NOTE — Progress Notes (Addendum)
ANTICOAGULATION & ANTIBIOTIC CONSULT NOTE - Follow Up Consult  Pharmacy Consult for Coumadin, Vancomycin Indication: atrial fibrillation and bacteremia  Allergies  Allergen Reactions  . Aspirin     Unknown    . Heparin     SHE is HIT neg; platelets have been normal on low dose heparin at out pt dialysis ; not clear why this is listed   . Minoxidil     Unknown    . Penicillins     Unknown      Patient Measurements: Height: 5\' 5"  (165.1 cm) Weight: 146 lb 13.2 oz (66.6 kg) IBW/kg (Calculated) : 57  Vital Signs: Temp: 99.9 F (37.7 C) (03/23 0952) Temp src: Oral (03/23 0952) BP: 136/64 mmHg (03/23 0952) Pulse Rate: 76 (03/23 0952)  Labs:  Recent Labs  05/30/12 0430 05/31/12 0700 05/31/12 0745 05/31/12 1120 06/01/12 0430  HGB  --  8.9*  --   --   --   HCT  --  27.6*  --   --   --   PLT  --  158  --   --   --   LABPROT 35.5*  --  30.6*  --  25.8*  INR 3.84*  --  3.14*  --  2.50*  CREATININE  --  4.70*  --  1.59*  --    ESRD - usual TTS hemodialysis  Assessment: 79yo F admitted 05/28/2012 with positive blood cultures from from [facility]  Pharmacy consulted to dose Vanc, & Coumadin  Events: tearful with wrist pain  Coag: Afib. INR 2.5, off warfarin 3/19-3/21; Of note, pt with heparin allergy, hx HIT. Home dose: 5 mg daily,    No bleeding noted.    Infectious Disease: GPC in BCx from NH. Repeat BCx drawn at [facility] pending. Pt was being treated with Avelox PTA for PNA. Also with hx MRSA bacteremia. afeb WBC 7.0 on 3/22.  Plan 4-6 wks abx.  Avelox (PTA-3/18) >> 3/19 Vanc 3/19 >> Levaquin 3/19 >>3/19 (1 dose) Zoysn 3/20>>3/20 (1 dose)  PTA BCx (NH) >> GPC  3/19 BCx >> 2/2 GPC - streptococus sp  Cardiovascular: Hx Afib/CAD/HTN/DL.  metop, simva. TTE showed thicked AoValve - no obvious veg;  VSS Endocrinology: DM. No A1c/TSH this admit. CBGs < 200 in last 24h On lantus 10 units qhs, sensitive SSI ac/hs.  Gastrointestinal / Nutrition: Hx GERD/prior GIB. LFTs 64/38.  Carb-modified diet. On colace, PPI po, renal vit Neurology: Hx dementia/CVA/depression. On cymbalta Nephrology: Hx ESRD-TThSat. Hectorol w/ HD Hematology / Oncology:Anemia Hgb 8.9 3/22. PLTC 158 3/22 Watch on Coumadin. PLTC was 199-221 in Jan 2014. Aranesp 100 mcg Friday with HD PTA Medication Issues: norvasc, combivent, avelox (abx chg'd inpt) Best Practices: Warfarin, PPI po (sepsis)   Goal of Therapy:  INR 2-3 Monitor platelets by anticoagulation protocol: Yes Pre-dialysis Vancomycin levels 15-25 mcg/ml  Plan:  Warfarin 3 mg today  Continue daily PT/INR.  Continue Vancomycin 750 mg IV after each HD. Second maintenance dose 3/22.   Follow up culture date and antibiotic plans.  With expected long course of Vancomycin, will plan to check pre-HD vancomycin level next week.   Thank you for allowing pharmacy to be a part of this patients care team.  Lovenia Kim Pharm.D., BCPS Clinical Pharmacist 06/01/2012 11:35 AM Pager: [phone] Phone: [phone]   12:03 PM Changed vancomycin to ancef 2g with HD.  Debarah Mccumbers Christine Virginia Crews

## 2012-06-01 NOTE — Progress Notes (Signed)
[facility]    Date of Admission:  05/28/2012   Total days of antibiotics 5        Day 5 vanco ( 3 doses, on HD days, last dose 3/22)           ID: Ebony [name] is a 79 y.o. female NH resident with DM,ESRD on HD, limited access with left thigh fistulal prsents with weakness and fever, found to have GPC bacteremia on numerous cultures 3/18, 319, previously treate with moxi for pneumonia. Has had prior hx of ecoli bacteremia, kleb and remote MRSA infection of right arm graft in 2012. Now found to have strep bovis bacteremia.   Active Problems:   DIABETES MELLITUS, TYPE II, ON INSULIN   ANEMIA OF CHRONIC DISEASE   DEMENTIA, MILD   Atrial fibrillation   End stage renal disease   Bacteremia   Supratherapeutic INR   Sepsis    Subjective: Afebrile, but has tender left wrist  Medications:  . darbepoetin (ARANESP) injection - DIALYSIS  200 mcg Intravenous Q Sat-HD  . docusate sodium  100 mg Oral BID  . doxercalciferol  1 mcg Intravenous Q T,Th,Sa-HD  . DULoxetine  20 mg Oral QAC breakfast  . feeding supplement (NEPRO CARB STEADY)  237 mL Oral BID BM  . feeding supplement  60 mL Oral BID  . insulin aspart  0-5 Units Subcutaneous QHS  . insulin aspart  0-9 Units Subcutaneous TID WC  . insulin glargine  10 Units Subcutaneous QHS  . metoprolol tartrate  25 mg Oral Q T,Th,S,Su  . metoprolol tartrate  25 mg Oral Custom  . multivitamin  1 tablet Oral QHS  . OxyCODONE  10 mg Oral Q12H  . pantoprazole  40 mg Oral Daily  . simvastatin  20 mg Oral QHS  . sodium chloride  3 mL Intravenous Q12H  . vancomycin  750 mg Intravenous Q T,Th,Sa-HD  . Warfarin - Pharmacist Dosing Inpatient   Does not apply q1800    Objective: Vital signs in last 24 hours: Temp:  [98.1 F (36.7 C)-99.9 F (37.7 C)] 99.9 F (37.7 C) (03/23 0952) Pulse Rate:  [53-76] 76 (03/23 0952) Resp:  [19-20] 20 (03/23 0952) BP: (99-136)/(37-66) 136/64 mmHg (03/23 0952) SpO2:  [92 %-100 %]  92 % (03/23 0952)  General: Awake, alert, nad, tearful regarding pain in wrist  Skin: graft site in left thigh with no tenderness, no erythema, no edema  Lungs: CTA B  Cor: RRR without m/r/g  Abdomen: Soft, ntnd, +bs     Lab Results  Recent Labs  05/31/12 0700 05/31/12 1120  WBC 7.0  --   HGB 8.9*  --   HCT 27.6*  --   NA 138 139  K 4.5 3.5  CL 98 98  CO2 30 28  BUN 46* 10  CREATININE 4.70* 1.59*   Liver Panel  Recent Labs  05/31/12 0700 05/31/12 1120  ALBUMIN 2.5* 2.7*    Microbiology: 3/19: blood cx x 2: strep group D, c/w strep. Bovis   Outpatient cx: 3/18: blood cx : strep group D, c/w strep.  Studies/Results: Dg Wrist 2 Views Left  06/01/2012  *RADIOLOGY REPORT*  Clinical Data: Left wrist pain.  LEFT WRIST - 1 VIEW  Comparison: 05/29/2012  Findings: A single lateral view of the left wrist is submitted as the patient is contracted.  No definite fracture, subluxation or dislocation on this single view.  The questioned distal ulnar fracture is not well  evaluated.  IMPRESSION: No definite acute abnormality. The questioned distal ulnar fracture on prior radiograph is not well evaluated.   Original Report Authenticated By: Harmon Pier, M.D.    Assessment/Plan: 79 yo F with ESRD on HD, with limited vascular access due to recurrent infection, now found to have streptococcal bovis/gallolyticus bacteremia, currently on vanco  - recommend to switch her vancomycin to cefazolin 2gm on HD days - repeat blood cultures to document clearance of bacteremia - treat at a minimum of 2 wks but likely to extend to 4 wks if has ongoing bacteremia or septic arthritis  - despite how the micro results are reported out as "high probability", they will do further confirmation of strep bovis isolate - due to association of strep bovis with colon cancer consider getting GI consultation to see if they would do colonoscopy -  Painful left wrist = would consider getting arthrocentesis to see  if consistent with gout vs. Septic arthritis   Fernando Stoiber, [facility] Cell: [phone] Pager: [phone]  06/01/2012, 11:32 AM

## 2012-06-01 NOTE — Progress Notes (Signed)
[facility]             Progress Note   Subjective: Patient seen, complains of left wrist pain.  Objective: Vital signs in last 24 hours: Temp:  [98.1 F (36.7 C)-99.9 F (37.7 C)] 99.9 F (37.7 C) (03/23 0952) Pulse Rate:  [61-76] 76 (03/23 0952) Resp:  [19-20] 20 (03/23 0952) BP: (100-136)/(49-66) 136/64 mmHg (03/23 0952) SpO2:  [92 %-100 %] 92 % (03/23 0952) Weight change: -1.2 kg (-2 lb 10.3 oz) Last BM Date: 05/30/12  Intake/Output from previous day: 03/22 0701 - 03/23 0700 In: 540 [P.O.:540] Out: 800  Total I/O In: 480 [P.O.:480] Out: -    Physical Exam: General: oriented x 2. HEENT: No bruits, no goiter. Heart: Regular rate and rhythm, without murmurs, rubs, gallops. Lungs: Clear to auscultation bilaterally. Abdomen: Soft, nontender, nondistended, positive bowel sounds. Extremities: left wrist flexion deformity, positive tender to palpation, no erythema  Lab Results: Basic Metabolic Panel:  Recent Labs  16/10/96 0700 05/31/12 1120  NA 138 139  K 4.5 3.5  CL 98 98  CO2 30 28  GLUCOSE 102* 97  BUN 46* 10  CREATININE 4.70* 1.59*  CALCIUM 8.8 8.3*  PHOS 2.1* 1.2*   Liver Function Tests:  Recent Labs  05/31/12 0700 05/31/12 1120  ALBUMIN 2.5* 2.7*   CBC:  Recent Labs  05/31/12 0700  WBC 7.0  HGB 8.9*  HCT 27.6*  MCV 84.1  PLT 158   CBG:  Recent Labs  05/30/12 2140 05/31/12 1135 05/31/12 1635 05/31/12 2029 06/01/12 0734 06/01/12 1135  GLUCAP 131* 93 262* 164* 133* 101*   Coagulation:  Recent Labs  05/31/12 0745 06/01/12 0430  LABPROT 30.6* 25.8*  INR 3.14* 2.50*    Recent Results (from the past 240 hour(s))  CULTURE, BLOOD (ROUTINE X 2)     Status: None   Collection Time    05/28/12  5:35 PM      Result Value Range Status   Specimen Description BLOOD RIGHT ARM   Final   Special Requests BOTTLES DRAWN AEROBIC AND ANAEROBIC 3CC   Final   Culture  Setup Time 05/29/2012 02:43   Final   Culture     Final    Value: STREPTOCOCCUS GROUP D;high probability for S.bovis     Note: SUSCEPTIBILITIES PERFORMED ON PREVIOUS CULTURE WITHIN THE LAST 5 DAYS.     Note: Gram Stain Report Called to,Read Back By and Verified With: HOLLY MATTHEWS @ 1228 05/29/12 BY KRAWS   Report Status 06/01/2012 FINAL   Final  CULTURE, BLOOD (ROUTINE X 2)     Status: None   Collection Time    05/28/12  5:45 PM      Result Value Range Status   Specimen Description BLOOD RIGHT HAND   Final   Special Requests BOTTLES DRAWN AEROBIC AND ANAEROBIC 3CC   Final   Culture  Setup Time 05/29/2012 02:43   Final   Culture     Final   Value: STREPTOCOCCUS GROUP D;high probability for S.bovis     Note: Gram Stain Report Called to,Read Back By and Verified With: HOLLY MATTHEWS @ 1228 05/29/12 BY KRAWS   Report Status 06/01/2012 FINAL   Final   Organism ID, Bacteria STREPTOCOCCUS GROUP D;high probability for S.bovis   Final  MRSA PCR SCREENING     Status: None   Collection Time    05/29/12  1:06 AM      Result Value Range Status   MRSA by PCR NEGATIVE  NEGATIVE Final   Comment:            The GeneXpert MRSA Assay (FDA     approved for NASAL specimens     only), is one component of a     comprehensive MRSA colonization     surveillance program. It is not     intended to diagnose MRSA     infection nor to guide or     monitor treatment for     MRSA infections.    Studies/Results: Dg Wrist 2 Views Left  06/01/2012  *RADIOLOGY REPORT*  Clinical Data: Left wrist pain.  LEFT WRIST - 1 VIEW  Comparison: 05/29/2012  Findings: A single lateral view of the left wrist is submitted as the patient is contracted.  No definite fracture, subluxation or dislocation on this single view.  The questioned distal ulnar fracture is not well evaluated.  IMPRESSION: No definite acute abnormality. The questioned distal ulnar fracture on prior radiograph is not well evaluated.   Original Report Authenticated By: Harmon Pier, M.D.     Medications: Scheduled  Meds: .  ceFAZolin (ANCEF) IV  2 g Intravenous Once  . [START ON 06/03/2012]  ceFAZolin (ANCEF) IV  2 g Intravenous Q T,Th,Sa-HD  . darbepoetin (ARANESP) injection - DIALYSIS  200 mcg Intravenous Q Sat-HD  . docusate sodium  100 mg Oral BID  . doxercalciferol  1 mcg Intravenous Q T,Th,Sa-HD  . DULoxetine  20 mg Oral QAC breakfast  . feeding supplement (NEPRO CARB STEADY)  237 mL Oral BID BM  . feeding supplement  60 mL Oral BID  . insulin aspart  0-5 Units Subcutaneous QHS  . insulin aspart  0-9 Units Subcutaneous TID WC  . insulin glargine  10 Units Subcutaneous QHS  . metoprolol tartrate  25 mg Oral Q T,Th,S,Su  . metoprolol tartrate  25 mg Oral Custom  . multivitamin  1 tablet Oral QHS  . OxyCODONE  10 mg Oral Q12H  . pantoprazole  40 mg Oral Daily  . simvastatin  20 mg Oral QHS  . sodium chloride  3 mL Intravenous Q12H  . warfarin  3 mg Oral ONCE-1800  . Warfarin - Pharmacist Dosing Inpatient   Does not apply q1800   Continuous Infusions:  PRN Meds:.sodium chloride, acetaminophen, acetaminophen, albuterol, ipratropium, ondansetron (ZOFRAN) IV, ondansetron, oxyCODONE-acetaminophen, sodium chloride  Assessment/Plan:  Active Problems:   DIABETES MELLITUS, TYPE II, ON INSULIN   ANEMIA OF CHRONIC DISEASE   DEMENTIA, MILD   Atrial fibrillation   End stage renal disease   Bacteremia   Supratherapeutic INR   Sepsis   GPC Bacteremia/Sepsis - Blood culture  is growing strep bovis - will start cefazolin 2 gm on HD days -Leukocytosis is improving  -Source is possibly her thigh AV graft, although there does not appear to be any erythema, edema or drainage noted from it. -She is not complaining of back pain  to make me suspicious for diskitis/epidural abscess. -2D Echo does not show any evidence of vegetations -Chest xray does not show any evidence of pneumonia -Requested ID consult  Left wrist pain Xray left wrist shows no fluid Could be gout, will check uric acid  level  ESRD -HD TTS. -nephrology following.  AFib -INR supratherapeutic. -Coumadin on hold (pharmacy dosing).  Disposition Return to SNF when stable.  Anemia of chronic disease -stable  Diabetes Mellitus -continue to monitor blood sugars, on sliding scale insulin and lantus  Left wrist pain -patent has been staking oxycodone prn q  4 hrs, will  Obtain Xray of left wrist, and start OxyContin 10 mg po q 12r  Time spent coordinating care: 30 minutes.   LOS: 4 days   [facility] Pager: [phone] 06/01/2012, 1:27 PM

## 2012-06-02 DIAGNOSIS — B952 Enterococcus as the cause of diseases classified elsewhere: Secondary | ICD-10-CM

## 2012-06-02 LAB — GLUCOSE, CAPILLARY
Glucose-Capillary: 162 mg/dL — ABNORMAL HIGH (ref 70–99)
Glucose-Capillary: 122 mg/dL — ABNORMAL HIGH (ref 70–99)

## 2012-06-02 LAB — PROTIME-INR: Prothrombin Time: 27.6 seconds — ABNORMAL HIGH (ref 11.6–15.2)

## 2012-06-02 MED ORDER — WARFARIN SODIUM 1 MG PO TABS
1.0000 mg | ORAL_TABLET | Freq: Once | ORAL | Status: AC
Start: 2012-06-02 — End: 2012-06-02
  Administered 2012-06-02: 1 mg via ORAL
  Filled 2012-06-02: qty 1

## 2012-06-02 NOTE — Progress Notes (Signed)
[name] Progress Note  Subjective:   Appears very drowsy. Nothing consumed from breakfast tray but states "she did the best she could." No emerging complaints.  Objective Filed Vitals:   06/01/12 1807 06/01/12 2116 06/02/12 0540 06/02/12 0935  BP: 127/45 118/63 120/41 121/45  Pulse: 66 71 65 66  Temp: 99.4 F (37.4 C) 99.2 F (37.3 C) 98.3 F (36.8 C) 97.9 F (36.6 C)  TempSrc: Oral Oral Oral Oral  Resp: 18 18 20 18   Height:      Weight:  66.044 kg (145 lb 9.6 oz)    SpO2: 92% 91% 91% 94%   Physical Exam General: Drowsy, chronically-ill appearing, NAD Heart: RRR Lungs: Clear bilaterally. No wheezes, rales, or rhonchi Abdomen: Soft, non-tender, normal BS Extremities: Left wrist contracture, no LE edema Dialysis Access: Lt thigh AVG, +bruit  Dialysis Orders: Center: [facility] on TTS .  EDW 66.5 kg HD Bath 2k, 2.25 ca Time 4hrs Heparin 0. Access left femoral avgg BFR 400 DFR 800 Hectorol 1 mcg IV/HD Epogen 0 Units IV/HD Venofer 0    Assessment/Plan: 1. Sepsis - SNF blood cultures with Gram + cocci, BCxs 3/19 + for Streptococcus, now afebrile; on Ancef per admit. No gross signs of thigh graft infection. Mental status now at baseline.  ID has signed off and recommending 2wks IV Ancef at HD thru 4/5, and possibly GI workup as outpt if aggressive Rx desired (risk of colon Ca with Strep bovis) 2. ESRD - HD on TTS @ [facility], thigh graft; K 3.5. HD tomorrow - 4K+ bath 3. Atrial fib - on Metoprolol; prev supratherapeutic INR on 3/20, INR 2.7 today - Coumadin resumed per admit. 4. Anemia - Hgb down to 8.9, continue Aranesp q Saturday HD. No op IV Fe - Last Tsat 30% on 3/13. 5. Hypertension/Volume - BP 121/45 on Metoprolol 25 mg qd x 4 days/wk and BID x 3 days/wk; wt 66.6 kg s/p net UF 800 ml on Sat. No s/s volume excess. 6. Secondary hyperparathyroidism - Ca 8.3 (9.3 corrected), P 1.2; Hectorol 1 mcg. No binders. 7. Nutrition - Alb 2.7, renal vitamin, high protein/carb mod diet,  Nepro. 8. 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  9. DM Type 2 - on Insulin per admit.  10. Hx dementia and CVA - with left hemiparesis/ left upper extremity contracture (no acute process on plain films) On OxyContin 10 mg BID for wrist pain. Very drowsy today. Will resume home med oxycodone 5/325 q4 PRN. Spoke with primary, some of her confusion this weekend may be due to scheduled oxycontin which was started 3d ago; she was not on long acting narcotics at home, would consider stopping oxycontin   Ebony [name] E. [name] Pager [phone] 06/02/2012,11:17 AM  LOS: 5 days   Patient seen and examined.  I agree with plan as above with additions as indicated. Ebony Moselle  MD [phone] pgr    [phone] cell 06/02/2012, 12:57 PM    Additional Objective Labs: Basic Metabolic Panel:  Recent Labs Lab 05/29/12 0330 05/31/12 0700 05/31/12 1120  NA 140 138 139  K 3.7 4.5 3.5  CL 96 98 98  CO2 30 30 28   GLUCOSE 166* 102* 97  BUN 41* 46* 10  CREATININE 5.61* 4.70* 1.59*  CALCIUM 9.3 8.8 8.3*  PHOS  --  2.1* 1.2*   Liver Function Tests:  Recent Labs Lab 05/28/12 1724 05/31/12 0700 05/31/12 1120  AST 64*  --   --   ALT  38*  --   --   ALKPHOS 217*  --   --   BILITOT 0.5  --   --   PROT 8.6*  --   --   ALBUMIN 2.9* 2.5* 2.7*   CBC:  Recent Labs Lab 05/28/12 1724 05/29/12 0330 05/31/12 0700  WBC 15.8* 12.8* 7.0  NEUTROABS 14.7*  --   --   HGB 10.3* 9.4* 8.9*  HCT 32.4* 29.1* 27.6*  MCV 85.7 84.1 84.1  PLT 134* 127* 158   Blood Culture    Component Value Date/Time   SDES BLOOD RIGHT ARM 06/01/2012 1455   SPECREQUEST BOTTLES DRAWN AEROBIC ONLY 10CC 06/01/2012 1455   CULT        BLOOD CULTURE RECEIVED NO GROWTH TO DATE CULTURE WILL BE HELD FOR 5 DAYS BEFORE ISSUING A FINAL NEGATIVE REPORT 06/01/2012 1455   REPTSTATUS PENDING 06/01/2012 1455   CBG:  Recent Labs Lab 06/01/12 0734 06/01/12 1135 06/01/12 1758 06/01/12 2121 06/02/12 0732  GLUCAP 133*  101* 117* 154* 149*   Studies/Results: Dg Wrist 2 Views Left  06/01/2012  *RADIOLOGY REPORT*  Clinical Data: Left wrist pain.  LEFT WRIST - 1 VIEW  Comparison: 05/29/2012  Findings: A single lateral view of the left wrist is submitted as the patient is contracted.  No definite fracture, subluxation or dislocation on this single view.  The questioned distal ulnar fracture is not well evaluated.  IMPRESSION: No definite acute abnormality. The questioned distal ulnar fracture on prior radiograph is not well evaluated.   Original Report Authenticated By: Harmon Pier, M.D.    Medications:   . [START ON 06/03/2012]  ceFAZolin (ANCEF) IV  2 g Intravenous Q T,Th,Sa-HD  . darbepoetin (ARANESP) injection - DIALYSIS  200 mcg Intravenous Q Sat-HD  . docusate sodium  100 mg Oral BID  . doxercalciferol  1 mcg Intravenous Q T,Th,Sa-HD  . DULoxetine  20 mg Oral QAC breakfast  . feeding supplement (NEPRO CARB STEADY)  237 mL Oral BID BM  . feeding supplement  60 mL Oral BID  . insulin aspart  0-5 Units Subcutaneous QHS  . insulin aspart  0-9 Units Subcutaneous TID WC  . insulin glargine  10 Units Subcutaneous QHS  . metoprolol tartrate  25 mg Oral Q T,Th,S,Su  . metoprolol tartrate  25 mg Oral Custom  . multivitamin  1 tablet Oral QHS  . pantoprazole  40 mg Oral Daily  . simvastatin  20 mg Oral QHS  . sodium chloride  3 mL Intravenous Q12H  . warfarin  1 mg Oral ONCE-1800  . Warfarin - Pharmacist Dosing Inpatient   Does not apply [phone]

## 2012-06-02 NOTE — Progress Notes (Signed)
[facility]             Progress Note   Subjective: No acute events/complaints.  Objective: Vital signs in last 24 hours: Temp:  [97.9 F (36.6 C)-99.4 F (37.4 C)] 97.9 F (36.6 C) (03/24 1405) Pulse Rate:  [62-71] 62 (03/24 1405) Resp:  [18-20] 18 (03/24 1405) BP: (106-127)/(41-63) 106/52 mmHg (03/24 1405) SpO2:  [91 %-94 %] 92 % (03/24 1405) Weight:  [66.044 kg (145 lb 9.6 oz)] 66.044 kg (145 lb 9.6 oz) (03/23 2116) Weight change: -0.556 kg (-1 lb 3.6 oz) Last BM Date: 05/31/12  Intake/Output from previous day: 03/23 0701 - 03/24 0700 In: 720 [P.O.:720] Out: -  Total I/O In: 120 [P.O.:120] Out: -    Physical Exam: General: oriented x 2. HEENT: No bruits, no goiter. Heart: Regular rate and rhythm, without murmurs, rubs, gallops. Lungs: Clear to auscultation bilaterally. Abdomen: Soft, nontender, nondistended, positive bowel sounds. Extremities: left wrist flexion deformity, positive tender to palpation, no erythema  Lab Results: Basic Metabolic Panel:  Recent Labs  16/10/96 0700 05/31/12 1120  NA 138 139  K 4.5 3.5  CL 98 98  CO2 30 28  GLUCOSE 102* 97  BUN 46* 10  CREATININE 4.70* 1.59*  CALCIUM 8.8 8.3*  PHOS 2.1* 1.2*   Liver Function Tests:  Recent Labs  05/31/12 0700 05/31/12 1120  ALBUMIN 2.5* 2.7*   CBC:  Recent Labs  05/31/12 0700  WBC 7.0  HGB 8.9*  HCT 27.6*  MCV 84.1  PLT 158   CBG:  Recent Labs  06/01/12 0734 06/01/12 1135 06/01/12 1758 06/01/12 2121 06/02/12 0732 06/02/12 1141  GLUCAP 133* 101* 117* 154* 149* 162*   Coagulation:  Recent Labs  06/01/12 0430 06/02/12 0545  LABPROT 25.8* 27.6*  INR 2.50* 2.73*    Recent Results (from the past 240 hour(s))  CULTURE, BLOOD (ROUTINE X 2)     Status: None   Collection Time    05/28/12  5:35 PM      Result Value Range Status   Specimen Description BLOOD RIGHT ARM   Final   Special Requests BOTTLES DRAWN AEROBIC AND ANAEROBIC 3CC   Final   Culture  Setup Time 05/29/2012 02:43   Final   Culture     Final   Value: STREPTOCOCCUS GROUP D;high probability for S.bovis     Note: SUSCEPTIBILITIES PERFORMED ON PREVIOUS CULTURE WITHIN THE LAST 5 DAYS.     Note: Gram Stain Report Called to,Read Back By and Verified With: HOLLY MATTHEWS @ 1228 05/29/12 BY KRAWS   Report Status 06/01/2012 FINAL   Final  CULTURE, BLOOD (ROUTINE X 2)     Status: None   Collection Time    05/28/12  5:45 PM      Result Value Range Status   Specimen Description BLOOD RIGHT HAND   Final   Special Requests BOTTLES DRAWN AEROBIC AND ANAEROBIC 3CC   Final   Culture  Setup Time 05/29/2012 02:43   Final   Culture     Final   Value: STREPTOCOCCUS GROUP D;high probability for S.bovis     Note: Gram Stain Report Called to,Read Back By and Verified With: HOLLY MATTHEWS @ 1228 05/29/12 BY KRAWS   Report Status 06/01/2012 FINAL   Final   Organism ID, Bacteria STREPTOCOCCUS GROUP D;high probability for S.bovis   Final  MRSA PCR SCREENING     Status: None   Collection Time    05/29/12  1:06 AM  Result Value Range Status   MRSA by PCR NEGATIVE  NEGATIVE Final   Comment:            The GeneXpert MRSA Assay (FDA     approved for NASAL specimens     only), is one component of a     comprehensive MRSA colonization     surveillance program. It is not     intended to diagnose MRSA     infection nor to guide or     monitor treatment for     MRSA infections.  CULTURE, BLOOD (ROUTINE X 2)     Status: None   Collection Time    06/01/12  2:50 PM      Result Value Range Status   Specimen Description BLOOD RIGHT HAND   Final   Special Requests BOTTLES DRAWN AEROBIC ONLY 7CC   Final   Culture  Setup Time 06/01/2012 21:14   Final   Culture     Final   Value:        BLOOD CULTURE RECEIVED NO GROWTH TO DATE CULTURE WILL BE HELD FOR 5 DAYS BEFORE ISSUING A FINAL NEGATIVE REPORT   Report Status PENDING   Incomplete  CULTURE, BLOOD (ROUTINE X 2)     Status: None   Collection  Time    06/01/12  2:55 PM      Result Value Range Status   Specimen Description BLOOD RIGHT ARM   Final   Special Requests BOTTLES DRAWN AEROBIC ONLY 10CC   Final   Culture  Setup Time 06/01/2012 21:15   Final   Culture     Final   Value:        BLOOD CULTURE RECEIVED NO GROWTH TO DATE CULTURE WILL BE HELD FOR 5 DAYS BEFORE ISSUING A FINAL NEGATIVE REPORT   Report Status PENDING   Incomplete    Studies/Results: Dg Wrist 2 Views Left  06/01/2012  *RADIOLOGY REPORT*  Clinical Data: Left wrist pain.  LEFT WRIST - 1 VIEW  Comparison: 05/29/2012  Findings: A single lateral view of the left wrist is submitted as the patient is contracted.  No definite fracture, subluxation or dislocation on this single view.  The questioned distal ulnar fracture is not well evaluated.  IMPRESSION: No definite acute abnormality. The questioned distal ulnar fracture on prior radiograph is not well evaluated.   Original Report Authenticated By: Harmon Pier, M.D.     Medications: Scheduled Meds: . [START ON 06/03/2012]  ceFAZolin (ANCEF) IV  2 g Intravenous Q T,Th,Sa-HD  . darbepoetin (ARANESP) injection - DIALYSIS  200 mcg Intravenous Q Sat-HD  . docusate sodium  100 mg Oral BID  . doxercalciferol  1 mcg Intravenous Q T,Th,Sa-HD  . DULoxetine  20 mg Oral QAC breakfast  . feeding supplement (NEPRO CARB STEADY)  237 mL Oral BID BM  . feeding supplement  60 mL Oral BID  . insulin aspart  0-5 Units Subcutaneous QHS  . insulin aspart  0-9 Units Subcutaneous TID WC  . insulin glargine  10 Units Subcutaneous QHS  . metoprolol tartrate  25 mg Oral Q T,Th,S,Su  . metoprolol tartrate  25 mg Oral Custom  . multivitamin  1 tablet Oral QHS  . pantoprazole  40 mg Oral Daily  . simvastatin  20 mg Oral QHS  . sodium chloride  3 mL Intravenous Q12H  . warfarin  1 mg Oral ONCE-1800  . Warfarin - Pharmacist Dosing Inpatient   Does not apply q1800   Continuous  Infusions:  PRN Meds:.sodium chloride, acetaminophen,  acetaminophen, albuterol, ipratropium, ondansetron (ZOFRAN) IV, ondansetron, oxyCODONE-acetaminophen, sodium chloride  Assessment/Plan:  Active Problems:   DIABETES MELLITUS, TYPE II, ON INSULIN   ANEMIA OF CHRONIC DISEASE   DEMENTIA, MILD   Atrial fibrillation   End stage renal disease   Bacteremia   Supratherapeutic INR   Sepsis   Strep Bovis Bacteremia/Sepsis - Doing well clinically. - Cefazolin with HD until 4/5 per ID recommendations. -high association of strep bovis bacteremia with colon cancer. She does not want to pursue a colonoscopy nor do I think that her physical condition will tolerate so I have agreed with her decision.  Left wrist pain -Resolved. -No clinical signs of infection.  ESRD -HD TTS. -nephrology following.  AFib -INR supratherapeutic. -Coumadin on hold (pharmacy dosing).  Disposition Return to SNF in am.  Anemia of chronic disease -stable  Diabetes Mellitus -continue to monitor blood sugars, on sliding scale insulin and lantus   Time spent coordinating care: 30 minutes.   LOS: 5 days   HERNANDEZ ACOSTA,ESTELA [facility] Pager: [phone] 06/02/2012, 3:49 PM

## 2012-06-02 NOTE — Progress Notes (Signed)
Chaplain responded to request for Spiritual Care Consult...found pt asleep. Pt awakened and engaged in a short conversation then fell asleep again. Will follow up as needed.  Rutherford Nail Chaplain

## 2012-06-02 NOTE — Progress Notes (Signed)
[facility]  Date of Admission:  05/28/2012  Antibiotics: Vancomycin 5 days Cefazolin x 2 days  Subjective: No complaints, not eating due to food choices  Objective: Temp:  [97.9 F (36.6 C)-100.3 F (37.9 C)] 97.9 F (36.6 C) (03/24 0935) Pulse Rate:  [63-71] 66 (03/24 0935) Resp:  [18-20] 18 (03/24 0935) BP: (113-127)/(41-63) 121/45 mmHg (03/24 0935) SpO2:  [91 %-94 %] 94 % (03/24 0935) Weight:  [145 lb 9.6 oz (66.044 kg)] 145 lb 9.6 oz (66.044 kg) (03/23 2116)  General: Awake, alert, nad Skin: no rashes Lungs: CTA B Cor: RRR without m/r/g Abdomen: soft, ntnd, +bs Ext: no edema  Lab Results Lab Results  Component Value Date   WBC 7.0 05/31/2012   HGB 8.9* 05/31/2012   HCT 27.6* 05/31/2012   MCV 84.1 05/31/2012   PLT 158 05/31/2012    Lab Results  Component Value Date   CREATININE 1.59* 05/31/2012   BUN 10 05/31/2012   NA 139 05/31/2012   K 3.5 05/31/2012   CL 98 05/31/2012   CO2 28 05/31/2012    Lab Results  Component Value Date   ALT 38* 05/28/2012   AST 64* 05/28/2012   ALKPHOS 217* 05/28/2012   BILITOT 0.5 05/28/2012      Microbiology: Recent Results (from the past 240 hour(s))  CULTURE, BLOOD (ROUTINE X 2)     Status: None   Collection Time    05/28/12  5:35 PM      Result Value Range Status   Specimen Description BLOOD RIGHT ARM   Final   Special Requests BOTTLES DRAWN AEROBIC AND ANAEROBIC 3CC   Final   Culture  Setup Time 05/29/2012 02:43   Final   Culture     Final   Value: STREPTOCOCCUS GROUP D;high probability for S.bovis     Note: SUSCEPTIBILITIES PERFORMED ON PREVIOUS CULTURE WITHIN THE LAST 5 DAYS.     Note: Gram Stain Report Called to,Read Back By and Verified With: HOLLY MATTHEWS @ 1228 05/29/12 BY KRAWS   Report Status 06/01/2012 FINAL   Final  CULTURE, BLOOD (ROUTINE X 2)     Status: None   Collection Time    05/28/12  5:45 PM      Result Value Range Status   Specimen Description BLOOD RIGHT HAND   Final   Special Requests BOTTLES DRAWN AEROBIC AND ANAEROBIC 3CC   Final   Culture  Setup Time 05/29/2012 02:43   Final   Culture     Final   Value: STREPTOCOCCUS GROUP D;high probability for S.bovis     Note: Gram Stain Report Called to,Read Back By and Verified With: HOLLY MATTHEWS @ 1228 05/29/12 BY KRAWS   Report Status 06/01/2012 FINAL   Final   Organism ID, Bacteria STREPTOCOCCUS GROUP D;high probability for S.bovis   Final  MRSA PCR SCREENING     Status: None   Collection Time    05/29/12  1:06 AM      Result Value Range Status   MRSA by PCR NEGATIVE  NEGATIVE Final   Comment:            The GeneXpert MRSA Assay (FDA     approved for NASAL specimens     only), is one component of a     comprehensive MRSA colonization     surveillance program. It is not     intended to diagnose MRSA     infection nor to guide or  monitor treatment for     MRSA infections.  CULTURE, BLOOD (ROUTINE X 2)     Status: None   Collection Time    06/01/12  2:50 PM      Result Value Range Status   Specimen Description BLOOD RIGHT HAND   Final   Special Requests BOTTLES DRAWN AEROBIC ONLY 7CC   Final   Culture  Setup Time 06/01/2012 21:14   Final   Culture     Final   Value:        BLOOD CULTURE RECEIVED NO GROWTH TO DATE CULTURE WILL BE HELD FOR 5 DAYS BEFORE ISSUING A FINAL NEGATIVE REPORT   Report Status PENDING   Incomplete  CULTURE, BLOOD (ROUTINE X 2)     Status: None   Collection Time    06/01/12  2:55 PM      Result Value Range Status   Specimen Description BLOOD RIGHT ARM   Final   Special Requests BOTTLES DRAWN AEROBIC ONLY 10CC   Final   Culture  Setup Time 06/01/2012 21:15   Final   Culture     Final   Value:        BLOOD CULTURE RECEIVED NO GROWTH TO DATE CULTURE WILL BE HELD FOR 5 DAYS BEFORE ISSUING A FINAL NEGATIVE REPORT   Report Status PENDING   Incomplete    Studies/Results: Dg Wrist 2 Views Left  06/01/2012  *RADIOLOGY REPORT*  Clinical Data: Left wrist pain.  LEFT WRIST - 1  VIEW  Comparison: 05/29/2012  Findings: A single lateral view of the left wrist is submitted as the patient is contracted.  No definite fracture, subluxation or dislocation on this single view.  The questioned distal ulnar fracture is not well evaluated.  IMPRESSION: No definite acute abnormality. The questioned distal ulnar fracture on prior radiograph is not well evaluated.   Original Report Authenticated By: Harmon Pier, M.D.     Assessment/Plan: 1) Strep bovis bacteremia - clinically she is doing well.  No significant fever or chills. Her repeat blood cultures are negative.   -treat for 2 weeks from 1st negative blood culture through 4/5 with cefazolin with HD - with Strep bovis, there is always concern for colon cancer.  If she wants aggressive intervention, she will need GI work up as an outpatient.  I will defer to the primary team/nephrology if that is appropriate.   - left wrist does not appear to be infected clinically.   I will sign off, please call with questions  Staci Righter, MD [facility] [phone] pager   06/02/2012, 12:40 PM

## 2012-06-02 NOTE — Progress Notes (Signed)
ANTICOAGULATION CONSULT NOTE - Follow Up Consult  Pharmacy Consult for Warfarin Indication: Hx Afib  Allergies  Allergen Reactions  . Aspirin     Unknown    . Heparin     SHE is HIT neg; platelets have been normal on low dose heparin at out pt dialysis ; not clear why this is listed   . Minoxidil     Unknown    . Penicillins     Unknown      Patient Measurements: Height: 5\' 5"  (165.1 cm) Weight: 145 lb 9.6 oz (66.044 kg) IBW/kg (Calculated) : 57  Vital Signs: Temp: 97.9 F (36.6 C) (03/24 0935) Temp src: Oral (03/24 0935) BP: 121/45 mmHg (03/24 0935) Pulse Rate: 66 (03/24 0935)  Labs:  Recent Labs  05/31/12 0700 05/31/12 0745 05/31/12 1120 06/01/12 0430 06/02/12 0545  HGB 8.9*  --   --   --   --   HCT 27.6*  --   --   --   --   PLT 158  --   --   --   --   LABPROT  --  30.6*  --  25.8* 27.6*  INR  --  3.14*  --  2.50* 2.73*  CREATININE 4.70*  --  1.59*  --   --     Estimated Creatinine Clearance: 25.4 ml/min (by C-G formula based on Cr of 1.59).  Assessment: 79 y.o. F resumed on warfarin from PTA for hx Afib with a therapeutic INR this morning. (INR 2.73 << 2.5, goal of 2-3). No CBC, no overt s/sx of bleeding noted.   Goal of Therapy:  INR 2-3   Plan:  1. Warfarin 1 mg x 1 dose at 1800 today 2. Will continue to monitor for any signs/symptoms of bleeding and will follow up with PT/INR in the a.m.   Georgina Pillion, PharmD, BCPS Clinical Pharmacist Pager: [phone] 06/02/2012 10:05 AM

## 2012-06-02 NOTE — Clinical Social Work Note (Addendum)
Patient from [facility] and will return at discharge. CSW advised by MD that patient will be ready for discharge back to facility on Tuesday, 3/25. CSW will facilitate transport to [facility].  Genelle Bal, MSW, LCSW [phone]

## 2012-06-03 LAB — CBC
HCT: 28.8 % — ABNORMAL LOW (ref 36.0–46.0)
Platelets: 182 10*3/uL (ref 150–400)
RBC: 3.47 MIL/uL — ABNORMAL LOW (ref 3.87–5.11)
RDW: 19.1 % — ABNORMAL HIGH (ref 11.5–15.5)
WBC: 6.4 10*3/uL (ref 4.0–10.5)

## 2012-06-03 LAB — RENAL FUNCTION PANEL
Albumin: 2.3 g/dL — ABNORMAL LOW (ref 3.5–5.2)
Chloride: 98 mEq/L (ref 96–112)
GFR calc non Af Amer: 5 mL/min — ABNORMAL LOW (ref >90)
Potassium: 3.8 mEq/L (ref 3.5–5.1)

## 2012-06-03 LAB — PROTIME-INR
INR: 2.95 — ABNORMAL HIGH (ref 0.00–1.49)
Prothrombin Time: 29.9 seconds — ABNORMAL HIGH (ref 11.6–15.2)

## 2012-06-03 LAB — GLUCOSE, CAPILLARY

## 2012-06-03 MED ORDER — DOXERCALCIFEROL 4 MCG/2ML IV SOLN
INTRAVENOUS | Status: AC
  Administered 2012-06-03: 1 ug via INTRAVENOUS
  Filled 2012-06-03: qty 2

## 2012-06-03 MED ORDER — CEFAZOLIN SODIUM-DEXTROSE 2-3 GM-% IV SOLR: 2.0000 g | INTRAVENOUS | Status: AC

## 2012-06-03 MED ORDER — OXYCODONE-ACETAMINOPHEN 5-325 MG PO TABS
ORAL_TABLET | ORAL | Status: AC
  Administered 2012-06-03: 2 via ORAL
  Filled 2012-06-03: qty 2

## 2012-06-03 MED ORDER — WARFARIN SODIUM 1 MG PO TABS
1.0000 mg | ORAL_TABLET | Freq: Once | ORAL | Status: DC
  Filled 2012-06-03: qty 1

## 2012-06-03 NOTE — Progress Notes (Signed)
Patient is ready for discharge.  Discharge instructions reviewed with patient.  D/C to [facility] via [facility]. Copies od D/C instructions sent with patient.  Personal belongings went with patient.

## 2012-06-03 NOTE — Discharge Summary (Signed)
Physician Discharge Summary  Patient ID: Ebony [name] MRN: [id number] DOB/AGE: 77-Apr-1931 77 y.o.  Admit date: 05/28/2012 Discharge date: 06/03/2012  Primary Care Physician:  Johny Sax, MD   Discharge Diagnoses:    Active Problems:   DIABETES MELLITUS, TYPE II, ON INSULIN   ANEMIA OF CHRONIC DISEASE   DEMENTIA, MILD   Atrial fibrillation   End stage renal disease   Bacteremia   Supratherapeutic INR   Sepsis      Medication List    STOP taking these medications       amLODipine 5 MG tablet  Commonly known as:  NORVASC     lidocaine-prilocaine cream  Commonly known as:  EMLA     moxifloxacin 400 MG tablet  Commonly known as:  AVELOX      TAKE these medications       acetaminophen 325 MG tablet  Commonly known as:  TYLENOL  Take 325 mg by mouth every 4 (four) hours as needed for pain (headache). For headache.     ceFAZolin 2-3 GM-% Solr  Commonly known as:  ANCEF  Inject 50 mLs (2 g total) into the vein Every Tuesday,Thursday,and Saturday with dialysis.     COLACE 100 MG capsule  Generic drug:  docusate sodium  Take 100 mg by mouth 2 (two) times daily.     DULoxetine 20 MG capsule  Commonly known as:  CYMBALTA  Take 20 mg by mouth daily before breakfast.     feeding supplement Liqd  Take 60 mLs by mouth 2 (two) times daily. Two serving twice daily     insulin aspart 100 UNIT/ML injection  Commonly known as:  novoLOG  Inject 5 Units into the skin 2 (two) times daily after a meal. For blood glucose > 150     insulin glargine 100 UNIT/ML injection  Commonly known as:  LANTUS  Inject 10 Units into the skin at bedtime.     ipratropium-albuterol 0.5-2.5 (3) MG/3ML Soln  Commonly known as:  DUONEB  Take 3 mLs by nebulization every 6 (six) hours as needed (for pneumonia).     metoprolol tartrate 25 MG tablet  Commonly known as:  LOPRESSOR  Take 25-50 mg by mouth as directed. Take 25 mg once daily Tues, Thurs, Sat. Take 25mg  twice daily Sun, Mon,  Wed, Fri     multivitamin Tabs tablet  Take 1 tablet by mouth every morning.     omeprazole 20 MG capsule  Commonly known as:  PRILOSEC  Take 40 mg by mouth every morning.     ondansetron 4 MG tablet  Commonly known as:  ZOFRAN  Take 4 mg by mouth every 8 (eight) hours as needed for nausea.     oxyCODONE-acetaminophen 5-325 MG per tablet  Commonly known as:  PERCOCET/ROXICET  Take 1-2 tablets by mouth every 4 (four) hours as needed for pain. For pain.  Give 1 tablet for pain scale 1-5 and give 2 tablets for pain scale 6-10.     simvastatin 20 MG tablet  Commonly known as:  ZOCOR  Take 20 mg by mouth at bedtime.     warfarin 5 MG tablet  Commonly known as:  COUMADIN  Take 5 mg by mouth every evening.         Disposition and Follow-up:  Will be discharged back to SNF today in stable and improved condition. Will be on Ancef with HD until 4/5.  Consults:  ID, Dr. Luciana Axe  Nephrology, Dr. Arlean Hopping   Significant Diagnostic Studies:  No results found.  Brief H and P: For complete details please refer to admission H and P, but in brief patient is an 79 y.o. female nursing home resident with history of MRSA bacteremia in the past, end-stage renal disease,diabetes mellitus, dementia and multiple medical problems as listed below who presents with above complaints. History is obtained from chart review and some country lesion from patient. It is reported that she had been weak and lethargic, and per nursing home records had fever>>was diagnosed with pneumonia and started on Avelox on 3/18. She denies cough, and per nursing facility only makes minimal urine(dialysis patient). Blood cultures were obtained and came back to nursing facility today positive for gram-positive cocci, and she was sent to the ED. She denies diarrhea no nausea or vomiting also denies chest pain and shortness of breath.  She was seen in the ED and a chest x-ray was done and showed no acute infiltrates, she was  noted to be febrile to 101.8, with lactic acid level elevated at 3.39. She is admitted for further evaluation and management.     Hospital Course:  Active Problems:   DIABETES MELLITUS, TYPE II, ON INSULIN   ANEMIA OF CHRONIC DISEASE   DEMENTIA, MILD   Atrial fibrillation   End stage renal disease   Bacteremia   Supratherapeutic INR   Sepsis   Group D Strep/Strep Bovis Bacteremia and Sepsis -Doing well clinically.  - Cefazolin with HD until 4/5 per ID recommendations.  -high association of strep bovis bacteremia with colon cancer. She does not want to pursue a colonoscopy nor do I think that her physical condition will tolerate so I have agreed with her decision.   Left wrist pain  -Resolved.  -No clinical signs of infection.   ESRD  -HD TTS.  -nephrology following.   AFib  -INR therapeutic on DC at 2.95. -Would recommend close INR follow up at SNF as she had been supratherapeutic to 3.14 during her hospitalization.  Anemia of chronic disease  -stable   Diabetes Mellitus  -Fair control.    Time spent on Discharge: Greater than 30 minutes.  SignedChaya Jan [facility] Pager: [phone] 06/03/2012, 9:34 AM

## 2012-06-03 NOTE — Clinical Social Work Note (Signed)
CSW intern advised that patient is medically stable for discharge. CSW intern has facilitated patient transport via ambulance to [facility]. RN will call report.  Fernande Boyden, Social Work Intern 06/03/2012

## 2012-06-03 NOTE — Procedures (Signed)
I was present at this dialysis session. I have reviewed the session itself and made appropriate changes.   [name], MD [employer] 06/03/2012, 12:18 PM

## 2012-06-03 NOTE — Progress Notes (Addendum)
ANTICOAGULATION & ANTIBIOTIC CONSULT NOTE - Follow Up Consult  Pharmacy Consult for Warfarin & Ancef Indication: Hx Afib & Strep bovis bacteremia  Allergies  Allergen Reactions  . Aspirin     Unknown    . Heparin     SHE is HIT neg; platelets have been normal on low dose heparin at out pt dialysis ; not clear why this is listed   . Minoxidil     Unknown    . Penicillins     Unknown      Patient Measurements: Height: 5\' 5"  (165.1 cm) Weight: 144 lb 2.9 oz (65.4 kg) IBW/kg (Calculated) : 57  Vital Signs: Temp: 98.5 F (36.9 C) (03/25 0706) Temp src: Oral (03/25 0706) BP: 118/61 mmHg (03/25 0905) Pulse Rate: 68 (03/25 0905)  Labs:  Recent Labs  05/31/12 1120  06/02/12 0545 06/03/12 0535 06/03/12 0724  HGB  --   --   --   --  9.2*  HCT  --   --   --   --  28.8*  PLT  --   --   --   --  182  LABPROT  --   < > 27.6* 29.9* 29.2*  INR  --   < > 2.73* 3.05* 2.95*  CREATININE 1.59*  --   --   --  6.81*  < > = values in this interval not displayed.  Estimated Creatinine Clearance: 5.9 ml/min (by C-G formula based on Cr of 6.81).  Assessment: 79 y.o. F resumed on warfarin from PTA for hx Afib with a therapeutic INR this morning. (INR 2.95 << 2.73, goal of 2-3). Hgb/Hct/Plt stable, no overt s/sx of bleeding noted.   The patient also continues on Ancef for Strep bovis bacteremia. Per ID, to continue for 2 weeks from the first negative blood culture. Repeat blood cultures on 3/23 are showing no growth to date. The patient is afebrile, WBC wnl, on normal T-Th-Sat HD schedule, dose remains appropriate at this time.  Goal of Therapy:  INR 2-3 Proper antibiotics for infection/cultures adjusted for renal/hepatic function    Plan:  1. Repeat Warfarin 1 mg x 1 dose at 1800 today 2. Continue Ancef 2g IV post HD on T/Th/Sat 3. Will continue to monitor for any signs/symptoms of bleeding and will follow up with PT/INR in the a.m.  4. Will continue to follow HD schedule/duration,  culture results, LOT, and antibiotic de-escalation plans   Georgina Pillion, PharmD, BCPS Clinical Pharmacist Pager: [phone] 06/03/2012 9:17 AM

## 2012-06-03 NOTE — Progress Notes (Signed)
[facility] Progress Note  Subjective:   Having trouble remembering if she has eaten.  No emerging complaints.  Seems to be at her baseline.  Objective Filed Vitals:   06/03/12 0800 06/03/12 0830 06/03/12 0900 06/03/12 0905  BP: 108/59 106/54 90/54 118/61  Pulse: 62 63 63 68  Temp:      TempSrc:      Resp:      Height:      Weight:      SpO2:       Physical Exam General: Chronically-ill appearing, NAD Heart: RRR Lungs: CTA bilaterally.  No wheezes, rales, or rhonchi noted Abdomen: Soft, non-tender, normal BS Extremities: Left wrist contracture, No LE edema Dialysis Access: L thigh AVG cannulated  Dialysis Orders: Center: [facility] on TTS .  EDW 66.5 kg HD Bath 2k, 2.25 ca Time 4hrs Heparin 0. Access left femoral avgg BFR 400 DFR 800 Hectorol 1 mcg IV/HD Epogen 0 Units IV/HD Venofer 0   Assessment/Plan: 1. Sepsis - SNF blood cultures with Gram + cocci, BCxs 3/19 + for Streptococcus, now afebrile; on Ancef per admit. No gross signs of thigh graft infection. Mental status now at baseline. ID has signed off and recommending 2wks IV Ancef at HD thru 4/5, and possibly GI workup as outpt if aggressive Rx desired (risk of colon Ca with Strep bovis bacteremia) 2. ESRD - HD on TTS @ [facility], Next HD 3/27 (op) 3. Atrial fib - on Metoprolol, Coumadin, INR 2.95 today 4. Anemia - Hgb improving at  9.2, continue Aranesp q Saturday HD. No op IV Fe - Last Tsat 30% on 3/13. Order Epo at discharge. 5. Hypertension/Volume - BP 1118/61 on Metoprolol 25 mg qd x 4 days/wk and BID x 3 days/wk; Slightly under EDW. Will check post HD wgts today and adjust EDW accordingly at discharge.  6. Secondary hyperparathyroidism - Ca 8.8 (10.2 corrected), P 4.3; Hectorol 1 mcg. Resume binders at discharge. 7. Nutrition - Alb 2.3, renal vitamin, high protein/carb mod diet, Nepro.  8. DM Type 2 - on Insulin per admit.  9. Hx dementia and CVA - with left hemiparesis/ left upper extremity contracture (no acute  process on plain films) On OxyContin 10 mg BID for wrist pain. Spoke with primary, some of her confusion this weekend may be due to scheduled oxycontin which was started 3d ago; she was not on long acting narcotics at home, would consider stopping OxyContin. 10. Dispo - Transport back to [facility] planned for today, after dialysis per SW   [name] E. Thad Ranger [facility] Pager [phone] 06/03/2012,9:20 AM  LOS: 6 days   Patient seen and examined.  Agree with assessment and plan as above. [name]  MD [phone] pgr    [phone] cell 06/03/2012, 12:17 PM   Additional Objective Labs: Basic Metabolic Panel:  Recent Labs Lab 05/31/12 0700 05/31/12 1120 06/03/12 0724  NA 138 139 139  K 4.5 3.5 3.8  CL 98 98 98  CO2 30 28 29   GLUCOSE 102* 97 82  BUN 46* 10 52*  CREATININE 4.70* 1.59* 6.81*  CALCIUM 8.8 8.3* 8.8  PHOS 2.1* 1.2* 4.3   Liver Function Tests:  Recent Labs Lab 05/28/12 1724 05/31/12 0700 05/31/12 1120 06/03/12 0724  AST 64*  --   --   --   ALT 38*  --   --   --   ALKPHOS 217*  --   --   --   BILITOT 0.5  --   --   --  PROT 8.6*  --   --   --   ALBUMIN 2.9* 2.5* 2.7* 2.3*   CBC:  Recent Labs Lab 05/28/12 1724 05/29/12 0330 05/31/12 0700 06/03/12 0724  WBC 15.8* 12.8* 7.0 6.4  NEUTROABS 14.7*  --   --   --   HGB 10.3* 9.4* 8.9* 9.2*  HCT 32.4* 29.1* 27.6* 28.8*  MCV 85.7 84.1 84.1 83.0  PLT 134* 127* 158 182   Blood Culture    Component Value Date/Time   SDES BLOOD RIGHT ARM 06/01/2012 1455   SPECREQUEST BOTTLES DRAWN AEROBIC ONLY 10CC 06/01/2012 1455   CULT        BLOOD CULTURE RECEIVED NO GROWTH TO DATE CULTURE WILL BE HELD FOR 5 DAYS BEFORE ISSUING A FINAL NEGATIVE REPORT 06/01/2012 1455   REPTSTATUS PENDING 06/01/2012 1455    CBG:  Recent Labs Lab 06/01/12 2121 06/02/12 0732 06/02/12 1141 06/02/12 1626 06/02/12 2114  GLUCAP 154* 149* 162* 94 122*    Studies/Results: No results found.  Medications:   .   ceFAZolin (ANCEF) IV  2 g Intravenous Q T,Th,Sa-HD  . darbepoetin (ARANESP) injection - DIALYSIS  200 mcg Intravenous Q Sat-HD  . docusate sodium  100 mg Oral BID  . doxercalciferol  1 mcg Intravenous Q T,Th,Sa-HD  . DULoxetine  20 mg Oral QAC breakfast  . feeding supplement (NEPRO CARB STEADY)  237 mL Oral BID BM  . feeding supplement  60 mL Oral BID  . insulin aspart  0-5 Units Subcutaneous QHS  . insulin aspart  0-9 Units Subcutaneous TID WC  . insulin glargine  10 Units Subcutaneous QHS  . metoprolol tartrate  25 mg Oral Q T,Th,S,Su  . metoprolol tartrate  25 mg Oral Custom  . multivitamin  1 tablet Oral QHS  . pantoprazole  40 mg Oral Daily  . simvastatin  20 mg Oral QHS  . sodium chloride  3 mL Intravenous Q12H  . Warfarin - Pharmacist Dosing Inpatient   Does not apply [phone]

## 2012-06-06 ENCOUNTER — Non-Acute Institutional Stay (SKILLED_NURSING_FACILITY): Payer: BC Managed Care – PPO | Admitting: Internal Medicine

## 2012-06-06 DIAGNOSIS — N186 End stage renal disease: Secondary | ICD-10-CM

## 2012-06-06 DIAGNOSIS — Z992 Dependence on renal dialysis: Secondary | ICD-10-CM

## 2012-06-06 DIAGNOSIS — A409 Streptococcal sepsis, unspecified: Secondary | ICD-10-CM

## 2012-06-06 DIAGNOSIS — E119 Type 2 diabetes mellitus without complications: Secondary | ICD-10-CM

## 2012-06-06 DIAGNOSIS — A4181 Sepsis due to Enterococcus: Secondary | ICD-10-CM

## 2012-06-06 DIAGNOSIS — A419 Sepsis, unspecified organism: Secondary | ICD-10-CM

## 2012-06-06 DIAGNOSIS — Z794 Long term (current) use of insulin: Secondary | ICD-10-CM

## 2012-06-06 NOTE — Progress Notes (Signed)
Patient ID: Ebony [name], female   DOB: [birth year], 79 y.o.   MRN: [id number]    PCP: Johny Sax, MD  Code Status: full code  Allergies  Allergen Reactions  . Aspirin     Unknown    . Heparin     SHE is HIT neg; platelets have been normal on low dose heparin at out pt dialysis ; not clear why this is listed   . Minoxidil     Unknown    . Penicillins     Unknown      Chief Complaint: re-admit post hospitalization 05/28/12- 06/03/12  HPI:  79 y/o LTR of the facility was admitted with fever, lethargy and blood culture growing gram positive cocci. She had group d strep/ step bovis bactermia and sepsis. ID was consulted. She has ESRD and is on HD 3/week and is getting cefazolin with her dialysis. She was sent back to SNF once afebrile and stable. She was seen in her room today and denies any complaints. See ros  Review of Systems: Review of Systems  Constitutional: Negative for fever, chills and diaphoresis.  HENT: Negative for congestion.   Eyes: Negative for blurred vision.  Respiratory: Negative for cough and shortness of breath.   Cardiovascular: Negative for chest pain and palpitations.  Gastrointestinal: Negative for heartburn, nausea, vomiting and abdominal pain.  Genitourinary: Negative for dysuria.  Skin: Negative for itching and rash.  Neurological: Positive for weakness. Negative for dizziness, focal weakness, seizures and headaches.  Psychiatric/Behavioral: Negative for depression.     Past Medical History  Diagnosis Date  . Atrial fibrillation   . CAD (coronary artery disease)   . Hypertension   . Anemia   . MRSA bacteremia     hx  . Cellulitis   . GERD (gastroesophageal reflux disease)   . Acetabulum fracture   . Contracture of elbow     L arm  . Dementia   . Diabetes mellitus     on Insulin  . Decubitus ulcer, buttock 02/07/2011    Pt presents with dressing to R buttocks/coccyx.  . Pneumonia   . Blood transfusion   . Stroke ~ 1990's   Left side paralysis.  Left  foot drop., L arm contracted.  Marland Kitchen ESRD on hemodialysis 03/13/11    dialysis at [facility]; TTS,last tx 03/12/11 hemo x 20years  . Depression   . Hyperlipidemia    Past Surgical History  Procedure Laterality Date  . Arteriovenous graft placement    . Appendectomy    . Cholecystectomy    . Tubal ligation      bilateral  . Abdominal hysterectomy    . Av fistula placement  02/07/2011    Procedure: INSERTION OF ARTERIOVENOUS (AV) GORE-TEX GRAFT THIGH;  Surgeon: Pryor Ochoa, MD;  Location: [facility] OR;  Service: Vascular;  Laterality: Right;  insertion AVGG right thigh  . Av fistula placement  02/12/2011    Procedure: INSERTION OF ARTERIOVENOUS (AV) GORE-TEX GRAFT THIGH;  Surgeon: Nilda Simmer, MD;  Location: [facility] OR;  Service: Vascular;  Laterality: Right;  . Cataract extraction w/ intraocular lens  implant, bilateral    . Avgg removal  05/24/2011    Procedure: REMOVAL OF ARTERIOVENOUS GORETEX GRAFT (AVGG);  Surgeon: Fransisco Hertz, MD;  Location: [facility] OR;  Service: Vascular;  Laterality: Right;  with bovine patch angioplasty  . Application of wound vac  05/24/2011    Procedure: APPLICATION OF WOUND VAC;  Surgeon: Fransisco Hertz, MD;  Location: [facility]  OR;  Service: Vascular;  Laterality: Right;  . Insertion of dialysis catheter  10/03/2011    Procedure: INSERTION OF DIALYSIS CATHETER;  Surgeon: Fransisco Hertz, MD;  Location: [facility] OR;  Service: Vascular;  Laterality: Left;  Insertion left femoral tunneled dialysis catheter  . Removal of a dialysis catheter  01/16/2012    Procedure: REMOVAL OF A DIALYSIS CATHETER;  Surgeon: Fransisco Hertz, MD;  Location: [facility] OR;  Service: Vascular;  Laterality: Left;  . Insertion of dialysis catheter  01/16/2012    Procedure: INSERTION OF DIALYSIS CATHETER;  Surgeon: Fransisco Hertz, MD;  Location: [facility] OR;  Service: Vascular;  Laterality: Left;   Social History:   reports that she quit smoking about 12 years ago. Her smoking use included Cigarettes. She has  a 7.5 pack-year smoking history. She has never used smokeless tobacco. She reports that she does not drink alcohol or use illicit drugs.  Family History  Problem Relation Age of Onset  . Diabetes Mother   . Hypertension Father   . Anesthesia problems Neg Hx   . Hypotension Neg Hx   . Malignant hyperthermia Neg Hx   . Pseudochol deficiency Neg Hx     Medications: Patient's Medications  New Prescriptions   No medications on file  Previous Medications   ACETAMINOPHEN (TYLENOL) 325 MG TABLET    Take 325 mg by mouth every 4 (four) hours as needed for pain (headache). For headache.   CEFAZOLIN (ANCEF) 2-3 GM-% SOLR    Inject 50 mLs (2 g total) into the vein Every Tuesday,Thursday,and Saturday with dialysis.   DOCUSATE SODIUM (COLACE) 100 MG CAPSULE    Take 100 mg by mouth 2 (two) times daily.    DULOXETINE (CYMBALTA) 20 MG CAPSULE    Take 20 mg by mouth daily before breakfast.    FEEDING SUPPLEMENT (PRO-STAT SUGAR FREE 64) LIQD    Take 60 mLs by mouth 2 (two) times daily. Two serving twice daily   INSULIN ASPART (NOVOLOG) 100 UNIT/ML INJECTION    Inject 5 Units into the skin 2 (two) times daily after a meal. For blood glucose > 150   INSULIN GLARGINE (LANTUS) 100 UNIT/ML INJECTION    Inject 10 Units into the skin at bedtime.    IPRATROPIUM-ALBUTEROL (DUONEB) 0.5-2.5 (3) MG/3ML SOLN    Take 3 mLs by nebulization every 6 (six) hours as needed (for pneumonia).   METOPROLOL TARTRATE (LOPRESSOR) 25 MG TABLET    Take 25-50 mg by mouth as directed. Take 25 mg once daily Tues, Thurs, Sat. Take 25mg  twice daily Sun, Mon, Wed, Fri   MULTIVITAMIN (RENA-VIT) TABS TABLET    Take 1 tablet by mouth every morning.    OMEPRAZOLE (PRILOSEC) 20 MG CAPSULE    Take 40 mg by mouth every morning.    ONDANSETRON (ZOFRAN) 4 MG TABLET    Take 4 mg by mouth every 8 (eight) hours as needed for nausea.   OXYCODONE-ACETAMINOPHEN (PERCOCET/ROXICET) 5-325 MG PER TABLET    Take 1-2 tablets by mouth every 4 (four) hours as  needed for pain. For pain.  Give 1 tablet for pain scale 1-5 and give 2 tablets for pain scale 6-10.   SIMVASTATIN (ZOCOR) 20 MG TABLET    Take 20 mg by mouth at bedtime.    WARFARIN (COUMADIN) 5 MG TABLET    Take 5 mg by mouth every evening.   Modified Medications   No medications on file  Discontinued Medications   No medications on file  Physical Exam: Filed Vitals:   06/06/12 1805  BP: 115/76  Pulse: 84  Temp: 98.6 F (37 C)  Resp: 18  Height: 5\' 2"  (1.575 m)  Weight: 144 lb (65.318 kg)  SpO2: 97%   Physical Exam  Constitutional:  Frail, elderly lady  HENT:  Head: Normocephalic and atraumatic.  Eyes: EOM are normal. Pupils are equal, round, and reactive to light.  Neck: Normal range of motion. Neck supple.  Cardiovascular: Normal rate and regular rhythm.   Pulmonary/Chest: Effort normal and breath sounds normal.  Abdominal: Soft. Bowel sounds are normal.  Musculoskeletal: Normal range of motion.  Has contracture in left hand  Neurological: She is alert.  Skin: Skin is warm and dry.  Psychiatric: She has a normal mood and affect.     Labs reviewed: Basic Metabolic Panel:  Recent Labs  16/10/96 0700 05/31/12 1120 06/03/12 0724  NA 138 139 139  K 4.5 3.5 3.8  CL 98 98 98  CO2 30 28 29   GLUCOSE 102* 97 82  BUN 46* 10 52*  CREATININE 4.70* 1.59* 6.81*  CALCIUM 8.8 8.3* 8.8  PHOS 2.1* 1.2* 4.3   Liver Function Tests:  Recent Labs  05/28/12 1724 05/31/12 0700 05/31/12 1120 06/03/12 0724  AST 64*  --   --   --   ALT 38*  --   --   --   ALKPHOS 217*  --   --   --   BILITOT 0.5  --   --   --   PROT 8.6*  --   --   --   ALBUMIN 2.9* 2.5* 2.7* 2.3*   No results found for this basename: LIPASE, AMYLASE,  in the last 8760 hours No results found for this basename: AMMONIA,  in the last 8760 hours CBC:  Recent Labs  03/28/12 1356  05/28/12 1724 05/29/12 0330 05/31/12 0700 06/03/12 0724  WBC 7.2  < > 15.8* 12.8* 7.0 6.4  NEUTROABS 5.3  --   14.7*  --   --   --   HGB 7.8*  < > 10.3* 9.4* 8.9* 9.2*  HCT 25.2*  < > 32.4* 29.1* 27.6* 28.8*  MCV 88.4  < > 85.7 84.1 84.1 83.0  PLT 235  < > 134* 127* 158 182  < > = values in this interval not displayed. Cardiac Enzymes: No results found for this basename: CKTOTAL, CKMB, CKMBINDEX, TROPONINI,  in the last 8760 hours BNP: No components found with this basename: POCBNP,  CBG:  Recent Labs  06/02/12 1626 06/02/12 2114 06/03/12 1224  GLUCAP 94 122* 88    Assessment/Plan  Group D Strep/Strep Bovis Bacteremia and Sepsis - Will continue and complete cefazolin until 06/14/12 with HD. Remains afebrile and clinically improved. Monitor. Does not want to persue colonoscopy  ESRD  Continue HD 3/week  AFib  Continue metoprolol for rate control and coumadin with goal inr 2-3. Continue statin  Anemia of chronic disease  stable . Monitor h/h routinely  Diabetes Mellitus  Continue glargine and aspart and monitor cbg.. Will continue PPI for her reflux. Symptom under control  Labs/tests ordered- cbc, cmp

## 2012-06-08 LAB — CULTURE, BLOOD (ROUTINE X 2)
Culture: NO GROWTH
Culture: NO GROWTH

## 2012-07-02 ENCOUNTER — Ambulatory Visit: Payer: Self-pay | Admitting: Vascular Surgery

## 2012-07-02 LAB — PROTIME-INR: Prothrombin Time: 34.8 secs — ABNORMAL HIGH (ref 11.5–14.7)

## 2012-07-31 ENCOUNTER — Non-Acute Institutional Stay (SKILLED_NURSING_FACILITY): Payer: Medicare Other | Admitting: Internal Medicine

## 2012-07-31 DIAGNOSIS — I4891 Unspecified atrial fibrillation: Secondary | ICD-10-CM

## 2012-07-31 DIAGNOSIS — Z7901 Long term (current) use of anticoagulants: Secondary | ICD-10-CM

## 2012-07-31 NOTE — Progress Notes (Signed)
Patient ID: Ebony [name], female   DOB: 1929-07-07, 79 y.o.   MRN: [id number]  CC-AV- coumadin management  HPI-  79 y/o female patient on coumadin for afib was seen today with supratherapeutic inr 5.65. No signs of bleeding. Denies any complaints  Review of Systems  Constitutional: Negative for fever, chills and diaphoresis.  HENT: Negative for congestion.   Eyes: Negative for blurred vision.  Respiratory: Negative for cough.   Cardiovascular: Negative for chest pain and palpitations.  Skin: Negative for rash.  Neurological: Negative for weakness and headaches.  Endo/Heme/Allergies: Does not bruise/bleed easily.   Exam- VSS, afebrile  gen- in NAD heent- no gum bleed, no nasal bleed, no ecchymoses cvs- irregular rate, no murmurs respi- b/l cta abdo- bs+, non tender Cns- non focal  Assessment/plan-  afib- rate controlled on metoprolol. On warfarin for anticoagulation  Long term anticoagulation- inr 5.65 today. No signs of bleeding. Will hold coumadin for now and check inr 08/01/12. Goal inr 2-3 for afib with secondary stroke prophylaxis.

## 2012-09-11 ENCOUNTER — Non-Acute Institutional Stay (SKILLED_NURSING_FACILITY): Payer: Medicare Other | Admitting: Internal Medicine

## 2012-09-11 DIAGNOSIS — Z7901 Long term (current) use of anticoagulants: Secondary | ICD-10-CM

## 2012-09-11 DIAGNOSIS — I4891 Unspecified atrial fibrillation: Secondary | ICD-10-CM

## 2012-09-11 DIAGNOSIS — F482 Pseudobulbar affect: Secondary | ICD-10-CM

## 2012-09-11 DIAGNOSIS — D638 Anemia in other chronic diseases classified elsewhere: Secondary | ICD-10-CM

## 2012-09-11 DIAGNOSIS — N186 End stage renal disease: Secondary | ICD-10-CM

## 2012-09-11 DIAGNOSIS — R791 Abnormal coagulation profile: Secondary | ICD-10-CM

## 2012-09-11 DIAGNOSIS — K219 Gastro-esophageal reflux disease without esophagitis: Secondary | ICD-10-CM

## 2012-09-11 DIAGNOSIS — E119 Type 2 diabetes mellitus without complications: Secondary | ICD-10-CM

## 2012-09-11 NOTE — Progress Notes (Signed)
Patient ID: Ebony [name], female   DOB: Jul 11, 1929, 79 y.o.   MRN: [id number]   Code Status: full code  Allergies  Allergen Reactions  . Aspirin     Unknown    . Heparin     SHE is HIT neg; platelets have been normal on low dose heparin at out pt dialysis ; not clear why this is listed   . Minoxidil     Unknown    . Penicillins     Unknown      Chief Complaint: medical management of chronic illness  HPI:  79 y/o female patient is a long term resident of the facility. She was seen today for routine visit. cbg mostly between 100-200 one in 70 and 40 and one of 360 Reviewed lantus 5 u qhs and novolog 5 u bid Denies any complaints  Review of Systems:  Constitutional:  Negative for appetite change.  Respiratory: Negative for wheezing, cough, shortness of breath Cardiovascular: Negative for chest pain and palpitations.  Gastrointestinal: Negative for abdominal pain, diarrhea and constipation.  Musculoskeletal: Negative for myalgias, back pain and arthralgias.  Skin: Negative.   Neurological: Negative for headaches.  Psychiatric/Behavioral: Negative.     Past Medical History  Diagnosis Date  . Atrial fibrillation   . CAD (coronary artery disease)   . Hypertension   . Anemia   . MRSA bacteremia     hx  . Cellulitis   . GERD (gastroesophageal reflux disease)   . Acetabulum fracture   . Contracture of elbow     L arm  . Dementia   . Diabetes mellitus     on Insulin  . Decubitus ulcer, buttock 02/07/2011    Pt presents with dressing to R buttocks/coccyx.  . Pneumonia   . Blood transfusion   . Stroke ~ 1990's    Left side paralysis.  Left  foot drop., L arm contracted.  Marland Kitchen ESRD on hemodialysis 03/13/11    dialysis at [facility]; TTS,last tx 03/12/11 hemo x 20years  . Depression   . Hyperlipidemia    Past Surgical History  Procedure Laterality Date  . Arteriovenous graft placement    . Appendectomy    . Cholecystectomy    . Tubal ligation      bilateral   . Abdominal hysterectomy    . Av fistula placement  02/07/2011    Procedure: INSERTION OF ARTERIOVENOUS (AV) GORE-TEX GRAFT THIGH;  Surgeon: Pryor Ochoa, MD;  Location: [facility] OR;  Service: Vascular;  Laterality: Right;  insertion AVGG right thigh  . Av fistula placement  02/12/2011    Procedure: INSERTION OF ARTERIOVENOUS (AV) GORE-TEX GRAFT THIGH;  Surgeon: Nilda Simmer, MD;  Location: [facility] OR;  Service: Vascular;  Laterality: Right;  . Cataract extraction w/ intraocular lens  implant, bilateral    . Avgg removal  05/24/2011    Procedure: REMOVAL OF ARTERIOVENOUS GORETEX GRAFT (AVGG);  Surgeon: Fransisco Hertz, MD;  Location: [facility] OR;  Service: Vascular;  Laterality: Right;  with bovine patch angioplasty  . Application of wound vac  05/24/2011    Procedure: APPLICATION OF WOUND VAC;  Surgeon: Fransisco Hertz, MD;  Location: [facility] OR;  Service: Vascular;  Laterality: Right;  . Insertion of dialysis catheter  10/03/2011    Procedure: INSERTION OF DIALYSIS CATHETER;  Surgeon: Fransisco Hertz, MD;  Location: [facility] OR;  Service: Vascular;  Laterality: Left;  Insertion left femoral tunneled dialysis catheter  . Removal of a dialysis catheter  01/16/2012  Procedure: REMOVAL OF A DIALYSIS CATHETER;  Surgeon: Fransisco Hertz, MD;  Location: [facility] OR;  Service: Vascular;  Laterality: Left;  . Insertion of dialysis catheter  01/16/2012    Procedure: INSERTION OF DIALYSIS CATHETER;  Surgeon: Fransisco Hertz, MD;  Location: [facility] OR;  Service: Vascular;  Laterality: Left;   Social History:   reports that she quit smoking about 12 years ago. Her smoking use included Cigarettes. She has a 7.5 pack-year smoking history. She has never used smokeless tobacco. She reports that she does not drink alcohol or use illicit drugs.  Family History  Problem Relation Age of Onset  . Diabetes Mother   . Hypertension Father   . Anesthesia problems Neg Hx   . Hypotension Neg Hx   . Malignant hyperthermia Neg Hx   . Pseudochol deficiency Neg Hx      Medications: Patient's Medications  New Prescriptions   No medications on file  Previous Medications   ACETAMINOPHEN (TYLENOL) 325 MG TABLET    Take 325 mg by mouth every 4 (four) hours as needed for pain (headache). For headache.   DEXTROMETHORPHAN-QUINIDINE (NUEDEXTA) 20-10 MG CAPS    Take 1 capsule by mouth 2 (two) times daily.   DOCUSATE SODIUM (COLACE) 100 MG CAPSULE    Take 100 mg by mouth 2 (two) times daily.    DULOXETINE (CYMBALTA) 20 MG CAPSULE    Take 60 mg by mouth daily before breakfast.    FEEDING SUPPLEMENT (PRO-STAT SUGAR FREE 64) LIQD    Take 60 mLs by mouth 2 (two) times daily. Two serving twice daily   INSULIN ASPART (NOVOLOG) 100 UNIT/ML INJECTION    Inject 5 Units into the skin 2 (two) times daily after a meal. For blood glucose > 150   INSULIN GLARGINE (LANTUS) 100 UNIT/ML INJECTION    Inject 5 Units into the skin at bedtime.    IPRATROPIUM-ALBUTEROL (DUONEB) 0.5-2.5 (3) MG/3ML SOLN    Take 3 mLs by nebulization every 6 (six) hours as needed (for pneumonia).   LIDOCAINE-PRILOCAINE (EMLA) CREAM    Apply 1 application topically daily.   METOPROLOL TARTRATE (LOPRESSOR) 25 MG TABLET    Take 25-50 mg by mouth as directed. Take 25 mg once daily Tues, Thurs, Sat. Take 25mg  twice daily Sun, Mon, Wed, Fri   MULTIVITAMIN (RENA-VIT) TABS TABLET    Take 1 tablet by mouth every morning.    OMEPRAZOLE (PRILOSEC) 20 MG CAPSULE    Take 40 mg by mouth every morning.    ONDANSETRON (ZOFRAN) 4 MG TABLET    Take 4 mg by mouth every 8 (eight) hours as needed for nausea.   OXYCODONE-ACETAMINOPHEN (PERCOCET/ROXICET) 5-325 MG PER TABLET    Take 1-2 tablets by mouth every 4 (four) hours as needed for pain. For pain.  Give 1 tablet for pain scale 1-5 and give 2 tablets for pain scale 6-10.   SIMVASTATIN (ZOCOR) 20 MG TABLET    Take 20 mg by mouth at bedtime.    WARFARIN (COUMADIN) 5 MG TABLET    Take 5 mg by mouth every evening.   Modified Medications   No medications on file  Discontinued  Medications   No medications on file     Physical Exam: Filed Vitals:   09/11/12 1006  BP: 140/60  Pulse: 66  Temp: 97.1 F (36.2 C)  Resp: 18  Height: 5\' 2"  (1.575 m)  Weight: 143 lb (64.864 kg)  SpO2: 94%   Constitutional:  Frail, elderly lady  HENT:  Head: Normocephalic and atraumatic.  Eyes: EOM are normal. Pupils are equal, round, and reactive to light.  Neck: Normal range of motion. Neck supple.  Cardiovascular: Normal rate and regular rhythm.   Pulmonary/Chest: Effort normal and breath sounds normal.  Abdominal: Soft. Bowel sounds are normal.  Musculoskeletal: Normal range of motion.  Has contracture in left hand  Neurological: She is alert.  Skin: Skin is warm and dry.  Psychiatric: She has a normal mood and affect.    Labs reviewed: Basic Metabolic Panel:  Recent Labs  16/10/96 0700 05/31/12 1120 06/03/12 0724  NA 138 139 139  K 4.5 3.5 3.8  CL 98 98 98  CO2 30 28 29   GLUCOSE 102* 97 82  BUN 46* 10 52*  CREATININE 4.70* 1.59* 6.81*  CALCIUM 8.8 8.3* 8.8  PHOS 2.1* 1.2* 4.3   Liver Function Tests:  Recent Labs  05/28/12 1724 05/31/12 0700 05/31/12 1120 06/03/12 0724  AST 64*  --   --   --   ALT 38*  --   --   --   ALKPHOS 217*  --   --   --   BILITOT 0.5  --   --   --   PROT 8.6*  --   --   --   ALBUMIN 2.9* 2.5* 2.7* 2.3*   CBC:  Recent Labs  03/28/12 1356  05/28/12 1724 05/29/12 0330 05/31/12 0700 06/03/12 0724  WBC 7.2  < > 15.8* 12.8* 7.0 6.4  NEUTROABS 5.3  --  14.7*  --   --   --   HGB 7.8*  < > 10.3* 9.4* 8.9* 9.2*  HCT 25.2*  < > 32.4* 29.1* 27.6* 28.8*  MCV 88.4  < > 85.7 84.1 84.1 83.0  PLT 235  < > 134* 127* 158 182  < > = values in this interval not displayed. Cardiac Enzymes: No results found for this basename: CKTOTAL, CKMB, CKMBINDEX, TROPONINI,  in the last 8760 hours BNP: No components found with this basename: POCBNP,  CBG:  Recent Labs  06/02/12 1626 06/02/12 2114 06/03/12 1224  GLUCAP 94 122* 88    Lab reviewed: 04-02-12: wbc 7.9; hgb 8.8;  hct 27.5; mcv 85.4; plt 261; glucose 197; bun 14; creat 3.75 K+ 4.0; na++ 138; liver normal albumin 3.0   05-28-12: blood culture: gm + cocci   06/04/12- wbc 5.4, hb 9.7, hct 31.8, plt 223, inr 2.88, na 142, k 4.5, glu 159, bun 21, cr 4.11, ca 8.8   Assessment/Plan  ESRD  Continue HD 3/week  AFib  Continue metoprolol for rate control and coumadin with goal inr 2-3. Continue statin  Anemia of chronic disease   stable . Monitor h/h routinely  Diabetes Mellitus   Continue glargine and aspart and monitor cbg. With her sugar better controlled, will change aspart to 5 u for cbg > 200 only and recheck a1c.   GERD Will continue PPI for her reflux. Symptom under control  Long term anticoagulation on coumadin for this. inr today 3.02 and is on 3 mg of coumadin.  Will hold coumadin tonight and recheck inr tomorrow. Goal inr 2-3.   Pseudobulbar affect continue nuedexta, patient is tolerating it well. Also on cymbalta to help with her mood  Constipation continue colace  Chronic pain Continue percocet for her chronic pain and monitor. Continue bowel regimen   Labs/tests ordered- a1c

## 2012-09-25 ENCOUNTER — Non-Acute Institutional Stay (SKILLED_NURSING_FACILITY): Payer: Medicare Other | Admitting: Internal Medicine

## 2012-09-25 DIAGNOSIS — I4891 Unspecified atrial fibrillation: Secondary | ICD-10-CM

## 2012-09-25 DIAGNOSIS — Z7901 Long term (current) use of anticoagulants: Secondary | ICD-10-CM

## 2012-09-25 NOTE — Progress Notes (Signed)
Patient ID: [name], female   DOB: 1930/01/09, 79 y.o.   MRN: [id number]  CC-AV- coumadin management  HPI-  79 y/o female patient on coumadin for afib was seen today with coumadin management. No signs of bleeding. Denies any complaints  Review of Systems  Constitutional: Negative for fever, chills and diaphoresis.  HENT: Negative for congestion.   Eyes: Negative for blurred vision.  Respiratory: Negative for cough.   Cardiovascular: Negative for chest pain and palpitations.  Skin: Negative for rash.  Neurological: Negative for weakness and headaches.  Endo/Heme/Allergies: Does not bruise/bleed easily.   Exam- VSS, afebrile  gen- in NAD heent- no gum bleed, no nasal bleed, no ecchymoses cvs- irregular rate, no murmurs respi- b/l cta abdo- bs+, non tender Cns- non focal  Assessment/plan-  afib- rate controlled on metoprolol. On warfarin for anticoagulation  Long term anticoagulation- inr 2.55 today. No signs of bleeding. On 3 mg coumadin and last inr 2.59. Continue current dosage and check inr in 1 week. Goal inr 2-3 for afib with secondary stroke prophylaxis.

## 2012-09-26 DIAGNOSIS — K219 Gastro-esophageal reflux disease without esophagitis: Secondary | ICD-10-CM | POA: Insufficient documentation

## 2012-10-08 ENCOUNTER — Other Ambulatory Visit: Payer: Self-pay | Admitting: Geriatric Medicine

## 2012-10-08 MED ORDER — OXYCODONE-ACETAMINOPHEN 5-325 MG PO TABS: 1.0000 | ORAL_TABLET | ORAL | Status: AC | PRN

## 2012-10-13 ENCOUNTER — Encounter: Payer: Self-pay | Admitting: *Deleted

## 2012-10-24 ENCOUNTER — Non-Acute Institutional Stay (SKILLED_NURSING_FACILITY): Payer: Medicare Other | Admitting: Internal Medicine

## 2012-10-24 DIAGNOSIS — I4891 Unspecified atrial fibrillation: Secondary | ICD-10-CM

## 2012-10-24 DIAGNOSIS — Z7901 Long term (current) use of anticoagulants: Secondary | ICD-10-CM

## 2012-10-24 DIAGNOSIS — N186 End stage renal disease: Secondary | ICD-10-CM

## 2012-10-24 DIAGNOSIS — F329 Major depressive disorder, single episode, unspecified: Secondary | ICD-10-CM

## 2012-10-24 DIAGNOSIS — L89629 Pressure ulcer of left heel, unspecified stage: Secondary | ICD-10-CM

## 2012-10-24 DIAGNOSIS — L89609 Pressure ulcer of unspecified heel, unspecified stage: Secondary | ICD-10-CM

## 2012-10-24 DIAGNOSIS — L8990 Pressure ulcer of unspecified site, unspecified stage: Secondary | ICD-10-CM

## 2012-10-24 DIAGNOSIS — K219 Gastro-esophageal reflux disease without esophagitis: Secondary | ICD-10-CM

## 2012-10-24 DIAGNOSIS — E119 Type 2 diabetes mellitus without complications: Secondary | ICD-10-CM

## 2012-10-24 DIAGNOSIS — K5900 Constipation, unspecified: Secondary | ICD-10-CM

## 2012-10-24 DIAGNOSIS — F482 Pseudobulbar affect: Secondary | ICD-10-CM

## 2012-10-24 DIAGNOSIS — I10 Essential (primary) hypertension: Secondary | ICD-10-CM

## 2012-10-24 NOTE — Progress Notes (Signed)
Patient ID: Ebony [name], female   DOB: 12-25-1929, 79 y.o.   MRN: [id number]  Ebony [name] living [facility]  Chief Complaint  Patient presents with  . Medical Managment of Chronic Issues   Code status: full code  HPI 79 y/o female patient is a long term resident of the facility. She denies any complaints today. She is lying on her bed and in no acute distress. No new concerns from staff. She is taking her meds and is under total care.  Review of Systems  Constitutional: Negative for fever and chills.  HENT: Negative for congestion and sore throat.   Eyes: Negative for blurred vision.  Respiratory: Negative for cough and shortness of breath.   Cardiovascular: Negative for chest pain, palpitations and orthopnea.  Gastrointestinal: Negative for heartburn, vomiting and abdominal pain.  Genitourinary: Negative for dysuria.  Musculoskeletal: Negative for myalgias and falls.  Skin: Negative for rash.  Neurological: Negative for dizziness, seizures, loss of consciousness, weakness and headaches.  Psychiatric/Behavioral: Positive for memory loss. Negative for depression and suicidal ideas. The patient is not nervous/anxious and does not have insomnia.    Medication reviewed  Current Outpatient Prescriptions on File Prior to Visit  Medication Sig Dispense Refill  . acetaminophen (TYLENOL) 325 MG tablet Take 325 mg by mouth every 4 (four) hours as needed for pain (headache). For headache.      . Dextromethorphan-Quinidine (NUEDEXTA) 20-10 MG CAPS Take 1 capsule by mouth 2 (two) times daily.      Marland Kitchen docusate sodium (COLACE) 100 MG capsule Take 100 mg by mouth 2 (two) times daily.       . DULoxetine (CYMBALTA) 20 MG capsule Take 60 mg by mouth daily before breakfast.       . feeding supplement (PRO-STAT SUGAR FREE 64) LIQD Take 60 mLs by mouth 2 (two) times daily. Two serving twice daily      . insulin aspart (NOVOLOG) 100 UNIT/ML injection Inject 5 Units into the skin 2 (two) times daily after a meal.  For blood glucose > 150      . insulin glargine (LANTUS) 100 UNIT/ML injection Inject 5 Units into the skin at bedtime.       Marland Kitchen ipratropium-albuterol (DUONEB) 0.5-2.5 (3) MG/3ML SOLN Take 3 mLs by nebulization every 6 (six) hours as needed (for pneumonia).      Marland Kitchen lidocaine-prilocaine (EMLA) cream Apply 1 application topically daily.      . metoprolol tartrate (LOPRESSOR) 25 MG tablet Take 25-50 mg by mouth as directed. Take 25 mg once daily Tues, Thurs, Sat. Take 25mg  twice daily Sun, Mon, Wed, Fri      . multivitamin (RENA-VIT) TABS tablet Take 1 tablet by mouth every morning.       Marland Kitchen omeprazole (PRILOSEC) 20 MG capsule Take 40 mg by mouth every morning.       . ondansetron (ZOFRAN) 4 MG tablet Take 4 mg by mouth every 8 (eight) hours as needed for nausea.      Marland Kitchen oxyCODONE (OXY IR/ROXICODONE) 5 MG immediate release tablet Take 5 mg by mouth every 4 (four) hours as needed for pain. Take 1-2 tablets by mouth every 4 hours as needed for pain.      Marland Kitchen oxyCODONE-acetaminophen (PERCOCET/ROXICET) 5-325 MG per tablet Take 1-2 tablets by mouth every 4 (four) hours as needed for pain. For pain.  Give 1 tablet for pain scale 1-5 and give 2 tablets for pain scale 6-10.  240 tablet  0  . simvastatin (ZOCOR) 20 MG  tablet Take 20 mg by mouth at bedtime.       Marland Kitchen warfarin (COUMADIN) 5 MG tablet Take 3 mg by mouth every evening.        No current facility-administered medications on file prior to visit.   Past Medical History  Diagnosis Date  . Atrial fibrillation 04/27/2009  . CAD (coronary artery disease) 05/15/2010  . Hypertension 12/19/2008  . Anemia 05/15/2010  . MRSA bacteremia 11/14/2009    hx  . Cellulitis   . GERD (gastroesophageal reflux disease) 01/26/2010  . Acetabulum fracture   . Contracture of elbow     L arm  . Dementia   . Diabetes mellitus 06/13/2009    on Insulin  . Decubitus ulcer, buttock 02/07/2011    Pt presents with dressing to R buttocks/coccyx.  . Pneumonia   . Blood  transfusion   . Stroke ~ 1990's    Left side paralysis.  Left  foot drop., L arm contracted.  Marland Kitchen ESRD on hemodialysis 03/13/11    dialysis at [facility]; TTS,last tx 03/12/11 hemo x 20years  . Depression 08/15/2009  . Hyperlipidemia 06/13/2009  . Secondary hyperparathyroidism (of renal origin) 11/14/2011  . Unspecified constipation 02/16/2011  . Pain in limb 02/16/2011  . Edema 05/22/2010  . Unspecified late effects of cerebrovascular disease 05/11/2009  . Long term (current) use of anticoagulants 02/07/2009  . Acute and subacute bacterial endocarditis 11/14/2009    Physical exam  BP 148/80  Pulse 84  Temp(Src) 97 F (36.1 C)  Resp 18  SpO2 94%  Constitutional:  Frail, elderly lady   HENT:   Head: Normocephalic and atraumatic.   Eyes: EOM are normal. Pupils are equal, round, and reactive to light.   Neck: Normal range of motion. Neck supple.   Cardiovascular: Normal rate and regular rhythm.    Pulmonary/Chest: Effort normal and breath sounds normal.   Abdominal: Soft. Bowel sounds are normal.  Musculoskeletal: Normal range of motion.  Has contracture in left hand  Neurological: She is alert.   Skin: Skin is warm and dry. Left heel ulcer 1x 0.9 x 0.3 cm, dressing is clean and dry Psychiatric: She has a normal mood and affect.    Lab reviewed: 04-02-12: wbc 7.9; hgb 8.8;  hct 27.5; mcv 85.4; plt 261; glucose 197; bun 14; creat 3.75 K+ 4.0; na++ 138; liver normal albumin 3.0   05-28-12: blood culture: gm + cocci   06/04/12- wbc 5.4, hb 9.7, hct 31.8, plt 223, inr 2.88, na 142, k 4.5, glu 159, bun 21, cr 4.11, ca 8.8  09/15/12 a1c 5.8  Assessment/Plan  ESRD  Continue HD 3/week. Continue renavite ad emla cream for dialysis site  Diabetes Mellitus   a1c is 5.8. With her age and medical comorbidities, and a1c < 6.5, will d/c her insulin and blood sugar check for now. Check a1c in 3 months  HTN bp reading mostly stable. Continue current lopressor regimen  Anemia of  chronic disease   stable . Check cbc next lab draw  Left heel ulcer Continue wound care and pressure ulcer prophylaxis  AFib  Continue metoprolol for rate control and coumadin with goal inr 2-3. Continue statin  Pseudobulbar affect continue nuedexta, patient is tolerating it well.   Depression Continue cymbalata, tolerating this well at present  GERD Will continue PPI for her reflux. Symptom under control  Chronic pain Continue percocet standing and prn for her chronic pain and monitor. Continue bowel regimen  Long term anticoagulation on coumadin for this.  Goal inr 2-3.   Constipation continue colace   Labs- cbc, cmp

## 2012-10-25 ENCOUNTER — Inpatient Hospital Stay (HOSPITAL_COMMUNITY): Payer: Medicare Other

## 2012-10-25 ENCOUNTER — Inpatient Hospital Stay (HOSPITAL_COMMUNITY)
Admission: EM | Admit: 2012-10-25 | Discharge: 2012-11-10 | DRG: 871 | Disposition: E | Payer: Medicare Other | Attending: Internal Medicine | Admitting: Internal Medicine

## 2012-10-25 ENCOUNTER — Encounter (HOSPITAL_COMMUNITY): Payer: Self-pay | Admitting: Emergency Medicine

## 2012-10-25 ENCOUNTER — Emergency Department (HOSPITAL_COMMUNITY): Payer: Medicare Other

## 2012-10-25 DIAGNOSIS — S42213A Unspecified displaced fracture of surgical neck of unspecified humerus, initial encounter for closed fracture: Secondary | ICD-10-CM | POA: Diagnosis present

## 2012-10-25 DIAGNOSIS — S72002A Fracture of unspecified part of neck of left femur, initial encounter for closed fracture: Secondary | ICD-10-CM

## 2012-10-25 DIAGNOSIS — F329 Major depressive disorder, single episode, unspecified: Secondary | ICD-10-CM

## 2012-10-25 DIAGNOSIS — W19XXXA Unspecified fall, initial encounter: Secondary | ICD-10-CM | POA: Clinically undetermined

## 2012-10-25 DIAGNOSIS — K219 Gastro-esophageal reflux disease without esophagitis: Secondary | ICD-10-CM

## 2012-10-25 DIAGNOSIS — I428 Other cardiomyopathies: Secondary | ICD-10-CM | POA: Diagnosis present

## 2012-10-25 DIAGNOSIS — E785 Hyperlipidemia, unspecified: Secondary | ICD-10-CM | POA: Diagnosis present

## 2012-10-25 DIAGNOSIS — I2510 Atherosclerotic heart disease of native coronary artery without angina pectoris: Secondary | ICD-10-CM | POA: Diagnosis present

## 2012-10-25 DIAGNOSIS — E211 Secondary hyperparathyroidism, not elsewhere classified: Secondary | ICD-10-CM

## 2012-10-25 DIAGNOSIS — S72033A Displaced midcervical fracture of unspecified femur, initial encounter for closed fracture: Secondary | ICD-10-CM | POA: Diagnosis present

## 2012-10-25 DIAGNOSIS — I4891 Unspecified atrial fibrillation: Secondary | ICD-10-CM

## 2012-10-25 DIAGNOSIS — S42309A Unspecified fracture of shaft of humerus, unspecified arm, initial encounter for closed fracture: Secondary | ICD-10-CM | POA: Diagnosis present

## 2012-10-25 DIAGNOSIS — F068 Other specified mental disorders due to known physiological condition: Secondary | ICD-10-CM | POA: Diagnosis present

## 2012-10-25 DIAGNOSIS — S72019A Unspecified intracapsular fracture of unspecified femur, initial encounter for closed fracture: Secondary | ICD-10-CM

## 2012-10-25 DIAGNOSIS — M6281 Muscle weakness (generalized): Secondary | ICD-10-CM | POA: Diagnosis present

## 2012-10-25 DIAGNOSIS — I120 Hypertensive chronic kidney disease with stage 5 chronic kidney disease or end stage renal disease: Secondary | ICD-10-CM | POA: Diagnosis present

## 2012-10-25 DIAGNOSIS — I10 Essential (primary) hypertension: Secondary | ICD-10-CM | POA: Diagnosis present

## 2012-10-25 DIAGNOSIS — N186 End stage renal disease: Secondary | ICD-10-CM

## 2012-10-25 DIAGNOSIS — D638 Anemia in other chronic diseases classified elsewhere: Secondary | ICD-10-CM

## 2012-10-25 DIAGNOSIS — J9600 Acute respiratory failure, unspecified whether with hypoxia or hypercapnia: Secondary | ICD-10-CM | POA: Diagnosis present

## 2012-10-25 DIAGNOSIS — Z7901 Long term (current) use of anticoagulants: Secondary | ICD-10-CM

## 2012-10-25 DIAGNOSIS — I429 Cardiomyopathy, unspecified: Secondary | ICD-10-CM

## 2012-10-25 DIAGNOSIS — M24539 Contracture, unspecified wrist: Secondary | ICD-10-CM | POA: Diagnosis present

## 2012-10-25 DIAGNOSIS — Z515 Encounter for palliative care: Secondary | ICD-10-CM

## 2012-10-25 DIAGNOSIS — K922 Gastrointestinal hemorrhage, unspecified: Secondary | ICD-10-CM

## 2012-10-25 DIAGNOSIS — E46 Unspecified protein-calorie malnutrition: Secondary | ICD-10-CM | POA: Diagnosis present

## 2012-10-25 DIAGNOSIS — E119 Type 2 diabetes mellitus without complications: Secondary | ICD-10-CM | POA: Diagnosis present

## 2012-10-25 DIAGNOSIS — A419 Sepsis, unspecified organism: Principal | ICD-10-CM | POA: Diagnosis present

## 2012-10-25 DIAGNOSIS — Z87891 Personal history of nicotine dependence: Secondary | ICD-10-CM

## 2012-10-25 DIAGNOSIS — S42302A Unspecified fracture of shaft of humerus, left arm, initial encounter for closed fracture: Secondary | ICD-10-CM

## 2012-10-25 DIAGNOSIS — F32A Depression, unspecified: Secondary | ICD-10-CM

## 2012-10-25 DIAGNOSIS — S72009A Fracture of unspecified part of neck of unspecified femur, initial encounter for closed fracture: Secondary | ICD-10-CM

## 2012-10-25 DIAGNOSIS — I6340 Cerebral infarction due to embolism of unspecified cerebral artery: Secondary | ICD-10-CM

## 2012-10-25 DIAGNOSIS — N2581 Secondary hyperparathyroidism of renal origin: Secondary | ICD-10-CM | POA: Diagnosis present

## 2012-10-25 DIAGNOSIS — I69959 Hemiplegia and hemiparesis following unspecified cerebrovascular disease affecting unspecified side: Secondary | ICD-10-CM

## 2012-10-25 DIAGNOSIS — Z992 Dependence on renal dialysis: Secondary | ICD-10-CM

## 2012-10-25 DIAGNOSIS — Z7401 Bed confinement status: Secondary | ICD-10-CM

## 2012-10-25 DIAGNOSIS — F0390 Unspecified dementia without behavioral disturbance: Secondary | ICD-10-CM

## 2012-10-25 LAB — GLUCOSE, CAPILLARY: Glucose-Capillary: 147 mg/dL — ABNORMAL HIGH (ref 70–99)

## 2012-10-25 LAB — COMPREHENSIVE METABOLIC PANEL
ALT: 18 U/L (ref 0–35)
AST: 28 U/L (ref 0–37)
Albumin: 2.4 g/dL — ABNORMAL LOW (ref 3.5–5.2)
CO2: 32 mEq/L (ref 19–32)
Calcium: 8.7 mg/dL (ref 8.4–10.5)
Chloride: 89 mEq/L — ABNORMAL LOW (ref 96–112)
GFR calc non Af Amer: 11 mL/min — ABNORMAL LOW (ref >90)
Sodium: 131 mEq/L — ABNORMAL LOW (ref 135–145)

## 2012-10-25 LAB — CBC WITH DIFFERENTIAL/PLATELET
Basophils Absolute: 0.1 10*3/uL (ref 0.0–0.1)
Basophils Relative: 1 % (ref 0–1)
Eosinophils Relative: 1 % (ref 0–5)
Lymphocytes Relative: 14 % (ref 12–46)
MCHC: 31 g/dL (ref 30.0–36.0)
Neutro Abs: 4.7 10*3/uL (ref 1.7–7.7)
Platelets: 207 10*3/uL (ref 150–400)
RDW: 19.8 % — ABNORMAL HIGH (ref 11.5–15.5)
WBC: 6.2 10*3/uL (ref 4.0–10.5)

## 2012-10-25 LAB — PROTIME-INR: INR: 2.81 — ABNORMAL HIGH (ref 0.00–1.49)

## 2012-10-25 LAB — PHOSPHORUS: Phosphorus: 2.7 mg/dL (ref 2.3–4.6)

## 2012-10-25 MED ORDER — SODIUM CHLORIDE 0.9 % IV SOLN: 250.0000 mL | INTRAVENOUS | Status: DC | PRN

## 2012-10-25 MED ORDER — NEPRO/CARBSTEADY PO LIQD
237.0000 mL | ORAL | Status: DC | PRN
  Filled 2012-10-25: qty 237

## 2012-10-25 MED ORDER — HYDROMORPHONE HCL PF 1 MG/ML IJ SOLN
1.0000 mg | INTRAMUSCULAR | Status: DC | PRN
  Administered 2012-10-28: 1 mg via INTRAVENOUS

## 2012-10-25 MED ORDER — SODIUM CHLORIDE 0.9 % IJ SOLN
3.0000 mL | Freq: Two times a day (BID) | INTRAMUSCULAR | Status: DC
  Administered 2012-10-25 – 2012-10-28 (×5): 3 mL via INTRAVENOUS

## 2012-10-25 MED ORDER — DULOXETINE HCL 60 MG PO CPEP
60.0000 mg | ORAL_CAPSULE | Freq: Every day | ORAL | Status: DC
  Administered 2012-10-26: 60 mg via ORAL
  Filled 2012-10-25 (×5): qty 1

## 2012-10-25 MED ORDER — SODIUM CHLORIDE 0.9 % IV SOLN
INTRAVENOUS | Status: DC
  Administered 2012-10-25: 07:00:00 via INTRAVENOUS

## 2012-10-25 MED ORDER — ALTEPLASE 2 MG IJ SOLR
2.0000 mg | Freq: Once | INTRAMUSCULAR | Status: DC | PRN
  Filled 2012-10-25: qty 2

## 2012-10-25 MED ORDER — PENTAFLUOROPROP-TETRAFLUOROETH EX AERO: 1.0000 "application " | INHALATION_SPRAY | CUTANEOUS | Status: DC | PRN

## 2012-10-25 MED ORDER — SODIUM CHLORIDE 0.9 % IJ SOLN: 3.0000 mL | INTRAMUSCULAR | Status: DC | PRN

## 2012-10-25 MED ORDER — NEPRO/CARBSTEADY PO LIQD
237.0000 mL | Freq: Two times a day (BID) | ORAL | Status: DC
  Administered 2012-10-26: 237 mL via ORAL
  Filled 2012-10-25 (×11): qty 237

## 2012-10-25 MED ORDER — LIDOCAINE HCL (PF) 1 % IJ SOLN: 5.0000 mL | INTRAMUSCULAR | Status: DC | PRN

## 2012-10-25 MED ORDER — PANTOPRAZOLE SODIUM 40 MG PO TBEC
40.0000 mg | DELAYED_RELEASE_TABLET | Freq: Every day | ORAL | Status: DC
  Administered 2012-10-26: 40 mg via ORAL
  Filled 2012-10-25 (×2): qty 1

## 2012-10-25 MED ORDER — WARFARIN SODIUM 3 MG PO TABS
3.0000 mg | ORAL_TABLET | Freq: Every day | ORAL | Status: DC
  Administered 2012-10-25 – 2012-10-26 (×2): 3 mg via ORAL
  Filled 2012-10-25 (×3): qty 1

## 2012-10-25 MED ORDER — DEXTROMETHORPHAN-QUINIDINE 20-10 MG PO CAPS: 1.0000 | ORAL_CAPSULE | Freq: Two times a day (BID) | ORAL | Status: DC

## 2012-10-25 MED ORDER — SODIUM CHLORIDE 0.9 % IV SOLN: 100.0000 mL | INTRAVENOUS | Status: DC | PRN

## 2012-10-25 MED ORDER — FENTANYL CITRATE 0.05 MG/ML IJ SOLN
50.0000 ug | Freq: Once | INTRAMUSCULAR | Status: AC
  Administered 2012-10-25: 50 ug via INTRAVENOUS
  Filled 2012-10-25: qty 2

## 2012-10-25 MED ORDER — ACETAMINOPHEN 325 MG PO TABS
650.0000 mg | ORAL_TABLET | Freq: Four times a day (QID) | ORAL | Status: DC | PRN
  Administered 2012-10-27: 650 mg via ORAL
  Filled 2012-10-25: qty 2

## 2012-10-25 MED ORDER — METOPROLOL TARTRATE 25 MG PO TABS
25.0000 mg | ORAL_TABLET | ORAL | Status: DC
  Administered 2012-10-26 – 2012-10-27 (×4): 25 mg via ORAL
  Filled 2012-10-25 (×4): qty 1

## 2012-10-25 MED ORDER — WARFARIN - PHARMACIST DOSING INPATIENT: Freq: Every day | Status: DC

## 2012-10-25 MED ORDER — COLLAGENASE 250 UNIT/GM EX OINT
1.0000 | TOPICAL_OINTMENT | Freq: Every day | CUTANEOUS | Status: DC
Start: 2012-10-25 — End: 2012-10-29
  Administered 2012-10-26: 1 via TOPICAL
  Filled 2012-10-25 (×2): qty 30

## 2012-10-25 MED ORDER — ALBUTEROL SULFATE (5 MG/ML) 0.5% IN NEBU: 2.5000 mg | INHALATION_SOLUTION | Freq: Four times a day (QID) | RESPIRATORY_TRACT | Status: DC | PRN

## 2012-10-25 MED ORDER — METOPROLOL TARTRATE 25 MG PO TABS
25.0000 mg | ORAL_TABLET | ORAL | Status: DC
  Administered 2012-10-25: 25 mg via ORAL
  Filled 2012-10-25 (×2): qty 1

## 2012-10-25 MED ORDER — ACETAMINOPHEN 650 MG RE SUPP: 650.0000 mg | Freq: Four times a day (QID) | RECTAL | Status: DC | PRN

## 2012-10-25 MED ORDER — IPRATROPIUM-ALBUTEROL 0.5-2.5 (3) MG/3ML IN SOLN: 3.0000 mL | Freq: Four times a day (QID) | RESPIRATORY_TRACT | Status: DC | PRN

## 2012-10-25 MED ORDER — METHOCARBAMOL 100 MG/ML IJ SOLN
500.0000 mg | Freq: Four times a day (QID) | INTRAVENOUS | Status: DC | PRN
  Filled 2012-10-25: qty 5

## 2012-10-25 MED ORDER — ONDANSETRON HCL 4 MG/2ML IJ SOLN
4.0000 mg | Freq: Three times a day (TID) | INTRAMUSCULAR | Status: AC | PRN
  Administered 2012-10-25: 4 mg via INTRAVENOUS
  Filled 2012-10-25: qty 2

## 2012-10-25 MED ORDER — METOPROLOL TARTRATE 25 MG PO TABS: 25.0000 mg | ORAL_TABLET | ORAL | Status: DC

## 2012-10-25 MED ORDER — TRAMADOL-ACETAMINOPHEN 37.5-325 MG PO TABS
2.0000 | ORAL_TABLET | ORAL | Status: DC
  Administered 2012-10-25 – 2012-10-26 (×5): 2 via ORAL
  Administered 2012-10-27: 1 via ORAL
  Administered 2012-10-27: 2 via ORAL
  Filled 2012-10-25 (×7): qty 2

## 2012-10-25 MED ORDER — DOCUSATE SODIUM 100 MG PO CAPS
100.0000 mg | ORAL_CAPSULE | Freq: Two times a day (BID) | ORAL | Status: DC
  Administered 2012-10-25 – 2012-10-27 (×4): 100 mg via ORAL
  Filled 2012-10-25 (×9): qty 1

## 2012-10-25 MED ORDER — LIDOCAINE-PRILOCAINE 2.5-2.5 % EX CREA: 1.0000 "application " | TOPICAL_CREAM | CUTANEOUS | Status: DC | PRN

## 2012-10-25 MED ORDER — INSULIN ASPART 100 UNIT/ML ~~LOC~~ SOLN
0.0000 [IU] | Freq: Three times a day (TID) | SUBCUTANEOUS | Status: DC
  Administered 2012-10-25: 1 [IU] via SUBCUTANEOUS
  Administered 2012-10-25: 12:00:00 via SUBCUTANEOUS
  Administered 2012-10-26: 3 [IU] via SUBCUTANEOUS
  Administered 2012-10-26 (×2): 2 [IU] via SUBCUTANEOUS
  Administered 2012-10-27 (×2): 1 [IU] via SUBCUTANEOUS
  Administered 2012-10-27: 2 [IU] via SUBCUTANEOUS

## 2012-10-25 MED ORDER — OXYCODONE-ACETAMINOPHEN 5-325 MG PO TABS: 1.0000 | ORAL_TABLET | ORAL | Status: DC | PRN

## 2012-10-25 MED ORDER — SIMVASTATIN 20 MG PO TABS
20.0000 mg | ORAL_TABLET | Freq: Every day | ORAL | Status: DC
  Administered 2012-10-25 – 2012-10-26 (×2): 20 mg via ORAL
  Filled 2012-10-25 (×3): qty 1

## 2012-10-25 MED ORDER — HEPARIN SODIUM (PORCINE) 1000 UNIT/ML DIALYSIS: 1000.0000 [IU] | INTRAMUSCULAR | Status: DC | PRN

## 2012-10-25 MED ORDER — RENA-VITE PO TABS
1.0000 | ORAL_TABLET | Freq: Every morning | ORAL | Status: DC
  Administered 2012-10-26 – 2012-10-27 (×2): 1 via ORAL
  Filled 2012-10-25 (×6): qty 1

## 2012-10-25 MED ORDER — IPRATROPIUM BROMIDE 0.02 % IN SOLN: 0.5000 mg | Freq: Four times a day (QID) | RESPIRATORY_TRACT | Status: DC | PRN

## 2012-10-25 NOTE — H&P (Addendum)
[facility] History and Physical  Ebony [name] ZOX:[id number] DOB: [birth year] DOA: November 23, 2012  Referring physician: ED PCP: Johny Sax, MD   Chief Complaint: Fall at [facility] with left humeral shaft fracture and left subcapital femur fracture  HPI:  79 y/o female with CAD, ESRD on HD ( Tu, th and sat), HTN, mild dementia, Afib on coumadin, hx of stroke with left sided paralysis, wheelchair bound , DM, who was [facility] after a [facility] from bed. She denies any headache, dizziness, blurred vision, chest pain, SOB, abdominal pain, nausea , vomiting or diarrhea. Patient landed on her left side and was unable to move her hip. She was brought to the ED.  Course in the ED Patient's vitals were stale. Labs showed elevated BUN and creatinine from her ESRD.  INR was 2.81. X ray of the left humerus showed displaced fracture of surgical neck of left humerus and acute displaced fracture of mid diaphysis. Xray of the hip showed non displaced capital fracture of left femur. CT of the head and cervical spine were unremarkable for any injury. She ws given a dose fo IV fentanyl and seen by Dr Shelle Iron who performed a closed reduction of the left mid shaft humerus. recommended for Recommend keeping elbow support with abduction pillow. Given her non weight bearing status and underlying co morbidities recommend no surgical intervention for her hip fracture. [facility] called for admission to medical floor. Patient appears sleepy on my evaluation after she received fentanyl but is arousable and answering questions.   Review of Systems:  Constitutional: Denies fever, chills, diaphoresis, appetite change and fatigue.  HEENT: Denies photophobia, eye pain, redness, hearing loss, ear pain, congestion, sore throat, rhinorrhea, sneezing, mouth sores, trouble swallowing, neck pain, neck stiffness and tinnitus.   Respiratory: Denies SOB, DOE, cough, chest tightness,  and wheezing.    Cardiovascular: Denies chest pain, palpitations and leg swelling.  Gastrointestinal: Denies nausea, vomiting, abdominal pain, diarrhea, constipation, blood in stool and abdominal distention.  Genitourinary: Denies  flank pain  Musculoskeletal: pain over left arm and hip . Denies , back pain, joint swelling, arthralgias  Neurological: Denies dizziness, seizures, syncope, weakness, light-headedness, numbness and headaches.  Hematological: Denies adenopathy. Easy bruising, Psychiatric/Behavioral: Denies, mood changes, confusion, nervousness, sleep disturbance and agitation   Past Medical History  Diagnosis Date  . Atrial fibrillation 04/27/2009  . CAD (coronary artery disease) 05/15/2010  . Hypertension 12/19/2008  . Anemia 05/15/2010  . MRSA bacteremia 11/14/2009    hx  . Cellulitis   . GERD (gastroesophageal reflux disease) 01/26/2010  . Acetabulum fracture   . Contracture of elbow     L arm  . Dementia   . Diabetes mellitus 06/13/2009    on Insulin  . Decubitus ulcer, buttock 02/07/2011    Pt presents with dressing to R buttocks/coccyx.  . Pneumonia   . Blood transfusion   . Stroke ~ 1990's    Left side paralysis.  Left  foot drop., L arm contracted.  Marland Kitchen ESRD on hemodialysis 03/13/11    dialysis at [facility]; TTS,last tx 03/12/11 hemo x 20years  . Depression 08/15/2009  . Hyperlipidemia 06/13/2009  . Secondary hyperparathyroidism (of renal origin) 11/14/2011  . Unspecified constipation 02/16/2011  . Pain in limb 02/16/2011  . Edema 05/22/2010  . Unspecified late effects of cerebrovascular disease 05/11/2009  . Long term (current) use of anticoagulants 02/07/2009  . Acute and subacute bacterial endocarditis 11/14/2009   Past Surgical History  Procedure Laterality Date  .  Arteriovenous graft placement    . Appendectomy    . Cholecystectomy    . Tubal ligation      bilateral  . Abdominal hysterectomy    . Av fistula placement  02/07/2011    Procedure: INSERTION OF  ARTERIOVENOUS (AV) GORE-TEX GRAFT THIGH;  Surgeon: Pryor Ochoa, MD;  Location: [facility] OR;  Service: Vascular;  Laterality: Right;  insertion AVGG right thigh  . Av fistula placement  02/12/2011    Procedure: INSERTION OF ARTERIOVENOUS (AV) GORE-TEX GRAFT THIGH;  Surgeon: Nilda Simmer, MD;  Location: [facility] OR;  Service: Vascular;  Laterality: Right;  . Cataract extraction w/ intraocular lens  implant, bilateral    . Avgg removal  05/24/2011    Procedure: REMOVAL OF ARTERIOVENOUS GORETEX GRAFT (AVGG);  Surgeon: Fransisco Hertz, MD;  Location: [facility] OR;  Service: Vascular;  Laterality: Right;  with bovine patch angioplasty  . Application of wound vac  05/24/2011    Procedure: APPLICATION OF WOUND VAC;  Surgeon: Fransisco Hertz, MD;  Location: [facility] OR;  Service: Vascular;  Laterality: Right;  . Insertion of dialysis catheter  10/03/2011    Procedure: INSERTION OF DIALYSIS CATHETER;  Surgeon: Fransisco Hertz, MD;  Location: [facility] OR;  Service: Vascular;  Laterality: Left;  Insertion left femoral tunneled dialysis catheter  . Removal of a dialysis catheter  01/16/2012    Procedure: REMOVAL OF A DIALYSIS CATHETER;  Surgeon: Fransisco Hertz, MD;  Location: [facility] OR;  Service: Vascular;  Laterality: Left;  . Insertion of dialysis catheter  01/16/2012    Procedure: INSERTION OF DIALYSIS CATHETER;  Surgeon: Fransisco Hertz, MD;  Location: [facility] OR;  Service: Vascular;  Laterality: Left;   Social History:  reports that she quit smoking about 12 years ago. Her smoking use included Cigarettes. She has a 7.5 pack-year smoking history. She has never used smokeless tobacco. She reports that she does not drink alcohol or use illicit drugs.  Allergies  Allergen Reactions  . Aspirin     Unknown    . Heparin     SHE is HIT neg; platelets have been normal on low dose heparin at out pt dialysis ; not clear why this is listed   . Minoxidil     Unknown    . Penicillins     Unknown      Family History  Problem Relation Age of Onset  .  Diabetes Mother   . Hypertension Father   . Anesthesia problems Neg Hx   . Hypotension Neg Hx   . Malignant hyperthermia Neg Hx   . Pseudochol deficiency Neg Hx     Prior to Admission medications   Medication Sig Start Date End Date Taking? Authorizing Provider  acetaminophen (TYLENOL) 325 MG tablet Take 325 mg by mouth every 4 (four) hours as needed for pain (headache). For headache.   Yes Historical Provider, MD  collagenase (SANTYL) ointment Apply 1 application topically daily.   Yes Historical Provider, MD  Dextromethorphan-Quinidine (NUEDEXTA) 20-10 MG CAPS Take 1 capsule by mouth 2 (two) times daily.   Yes Historical Provider, MD  docusate sodium (COLACE) 100 MG capsule Take 100 mg by mouth 2 (two) times daily.    Yes Historical Provider, MD  DULoxetine (CYMBALTA) 60 MG capsule Take 60 mg by mouth daily.   Yes Historical Provider, MD  insulin aspart (NOVOLOG) 100 UNIT/ML injection Inject 5 Units into the skin 2 (two) times daily after a meal. For blood glucose > 200  Yes Historical Provider, MD  insulin glargine (LANTUS) 100 UNIT/ML injection Inject 5 Units into the skin at bedtime.    Yes Historical Provider, MD  ipratropium-albuterol (DUONEB) 0.5-2.5 (3) MG/3ML SOLN Take 3 mLs by nebulization every 6 (six) hours as needed (for pneumonia).   Yes Historical Provider, MD  lidocaine-prilocaine (EMLA) cream Apply 1 application topically every hemodialysis. On Tuesday, Thursday, Friday and Saturday   Yes Historical Provider, MD  metoprolol tartrate (LOPRESSOR) 25 MG tablet Take 25-50 mg by mouth as directed. Take 25 mg once daily Tues, Thurs, Sat. Take 25mg  twice daily Sun, Mon, Wed, Fri   Yes Historical Provider, MD  multivitamin (RENA-VIT) TABS tablet Take 1 tablet by mouth every morning.    Yes Historical Provider, MD  Nutritional Supplements (FEEDING SUPPLEMENT, NEPRO CARB STEADY,) LIQD Take 237 mL by mouth 2 (two) times daily between meals.   Yes Historical Provider, MD  omeprazole  (PRILOSEC) 20 MG capsule Take 40 mg by mouth every morning.    Yes Historical Provider, MD  oxyCODONE-acetaminophen (PERCOCET/ROXICET) 5-325 MG per tablet Take 1-2 tablets by mouth every 4 (four) hours as needed for pain. For pain.  Give 1 tablet for pain scale 1-5 and give 2 tablets for pain scale 6-10. 10/08/12  Yes Claudie Revering, NP  simvastatin (ZOCOR) 20 MG tablet Take 20 mg by mouth at bedtime.    Yes Historical Provider, MD  warfarin (COUMADIN) 3 MG tablet Take 3 mg by mouth every evening.   Yes Historical Provider, MD    Physical Exam:  Filed Vitals:   11/18/2012 0430 Nov 18, 2012 0530 2012/11/18 0630 2012-11-18 0755  BP: 151/59 167/61 163/56 145/60  Pulse:  59 62   Temp:      TempSrc:      Resp: 31 14 17    SpO2:  100% 95%     Constitutional: Vital signs reviewed.  . sleepy Head: Normocephalic and atraumatic Ear: TM normal bilaterally Mouth: no erythema or exudates, MMM Eyes: PERRL, EOMI, conjunctivae normal, No scleral icterus.  Neck: Supple, Trachea midline normal ROM, No JVD, mass, thyromegaly, or carotid bruit present.  Cardiovascular: RRR, S1 normal, S2 normal, no MRG, pulses symmetric and intact bilaterally Pulmonary/Chest: CTAB, no wheezes, rales, or rhonchi Abdominal: Soft. Non-tender, non-distended, bowel sounds are normal, no masses, organomegaly, or guarding present.  GU: no CVA tenderness Musculoskeletal: abducted left arm post closed reduction. Contracted left wrist ( chronic), feeble left radial pulse. Left hip internally rotated. AV graft over left thigh with good thrill Ext: no edema and no cyanosis, pulses palpable bilaterally. feeble radial pulse Neurological: A&O x3, sleepy but arousable  Labs on Admission:  Basic Metabolic Panel:  Recent Labs Lab 11-18-2012 0200  NA 131*  K 3.8  CL 89*  CO2 32  GLUCOSE 397*  BUN 23  CREATININE 3.51*  CALCIUM 8.7   Liver Function Tests:  Recent Labs Lab Nov 18, 2012 0200  AST 28  ALT 18  ALKPHOS 166*  BILITOT 0.6   PROT 8.3  ALBUMIN 2.4*   No results found for this basename: LIPASE, AMYLASE,  in the last 168 hours No results found for this basename: AMMONIA,  in the last 168 hours CBC:  Recent Labs Lab 11-18-12 0200  WBC 6.2  NEUTROABS 4.7  HGB 10.3*  HCT 33.2*  MCV 84.5  PLT 207   Cardiac Enzymes:  Recent Labs Lab Nov 18, 2012 0147  TROPONINI <0.30   BNP: No components found with this basename: POCBNP,  CBG: No results found for this basename:  GLUCAP,  in the last 168 hours  Radiological Exams on Admission: Dg Chest 1 View  2012/11/23   *RADIOLOGY REPORT*  Clinical Data: Fall  CHEST - 1 VIEW  Comparison: Concurrently obtained radiographs of the left shoulder and upper extremity  Findings: Low inspiratory volumes with bibasilar atelectasis. Stable cardiomegaly.  Atherosclerotic calcifications noted in the transverse aorta.  Displaced fracture of the surgical neck of the humerus.  No definite acute rib fracture identified.  Overlapping metallic stents noted in the left upper arm.  The tip of a femoral approach catheter, likely HERO graft projects over the inferior cavoatrial junction.  This fifth  IMPRESSION:  1.  Low inspiratory volumes with bibasilar atelectasis. 2.  Left humeral neck fracture.   Original Report Authenticated By: Malachy Moan, M.D.   Dg Shoulder 1v Left  Nov 23, 2012   *RADIOLOGY REPORT*  Clinical Data: Fall, left arm pain  LEFT SHOULDER - 1 VIEW  Comparison: Concurrently obtained radiographs of the left humerus and chest  Findings: Acute displaced and likely impacted fracture through the surgical neck of the humerus.  Additionally, there is an acute displaced fracture of the mid humeral diaphysis with a full shaft with displacement of the fracture fragments.  Incidental note is made of overlapping stents in the upper arm.  No acute rib fracture identified.  IMPRESSION:  1.  Acute displaced and impacted fracture through the surgical neck of the left humerus. 2.  Acute  fracture through the mid diaphysis of the humerus with one full shaft with displacement of the fracture fragments.   Original Report Authenticated By: Malachy Moan, M.D.   Dg Hip Complete Left  November 23, 2012   *RADIOLOGY REPORT*  Clinical Data: Fall, left arm and hip pain  LEFT HIP - COMPLETE 2+ VIEW  Comparison: None.  Findings: The bones are diffusely osteopenic.  Acute impacted subcapital femoral fracture.  The femoral head remains located. Left femoral approach HERO dialysis access.  Multilevel degenerative disc disease in lower lumbar facet arthropathy. Surgical changes of right hip arthroplasty.  Atherosclerotic vascular calcifications.  IMPRESSION:  1.  Acute impacted subcapital femoral fracture on the left. 2.  Surgical changes of prior right hip arthroplasty 3.  Diffuse osteopenia 4.  Left femoral approach HERO dialysis access.   Original Report Authenticated By: Malachy Moan, M.D.   Ct Head Wo Contrast  23-Nov-2012   *RADIOLOGY REPORT*  Clinical Data:  Fall, history of prior stroke  CT HEAD WITHOUT CONTRAST CT CERVICAL SPINE WITHOUT CONTRAST  Technique:  Multidetector CT imaging of the head and cervical spine was performed following the standard protocol without intravenous contrast.  Multiplanar CT image reconstructions of the cervical spine were also generated.  Comparison:  Most recent prior head CT 01/17/2010  CT HEAD  Findings: No acute intracranial hemorrhage, acute infarction, mass lesion, mass effect, midline shift or hydrocephalus.  Gray-white differentiation is preserved throughout.  Stable appearance of the left cerebellar and right PCA territory infarct with associated encephalomalacia.  Cortical and cerebellar atrophy is similar compared to prior.  Periventricular white matter hypoattenuation consistent with the sequela of microvascular ischemia is similar to prior.  The globes and orbits are intact.  No focal scalp hematoma or calvarial fracture.  Atherosclerotic calcifications in  the bilateral cavernous carotid arteries.  Normal aeration of the mastoid air cells and visualized paranasal sinuses.  IMPRESSION:  1.  No acute intracranial abnormality. 2.  Stable appearance of right PCA and left cerebellar infarcts. 3.  Stable atrophy and microvascular ischemic white matter changes.  4.  Intracranial atherosclerosis  CT CERVICAL SPINE  Findings: No acute fracture, malalignment or prevertebral soft tissue swelling.  No acute soft tissue abnormality.  Mild atherosclerotic vascular calcifications in the carotid arteries. Multilevel degenerative disc disease most significant at C4-C5 for a posterior calcified disc osteophyte complex results in mild central canal narrowing.  There is exaggerated cervical lordosis with crowding of the spinous processes.  This is favored to be degenerative in etiology.  IMPRESSION:  1.  No acute fracture or malalignment. 2.  C4-C5 posterior disc osteophyte complex results in mild central canal stenosis. 3.  Atherosclerosis.   Original Report Authenticated By: Malachy Moan, M.D.   Ct Cervical Spine Wo Contrast  2012/11/19   *RADIOLOGY REPORT*  Clinical Data:  Fall, history of prior stroke  CT HEAD WITHOUT CONTRAST CT CERVICAL SPINE WITHOUT CONTRAST  Technique:  Multidetector CT imaging of the head and cervical spine was performed following the standard protocol without intravenous contrast.  Multiplanar CT image reconstructions of the cervical spine were also generated.  Comparison:  Most recent prior head CT 01/17/2010  CT HEAD  Findings: No acute intracranial hemorrhage, acute infarction, mass lesion, mass effect, midline shift or hydrocephalus.  Gray-white differentiation is preserved throughout.  Stable appearance of the left cerebellar and right PCA territory infarct with associated encephalomalacia.  Cortical and cerebellar atrophy is similar compared to prior.  Periventricular white matter hypoattenuation consistent with the sequela of microvascular ischemia  is similar to prior.  The globes and orbits are intact.  No focal scalp hematoma or calvarial fracture.  Atherosclerotic calcifications in the bilateral cavernous carotid arteries.  Normal aeration of the mastoid air cells and visualized paranasal sinuses.  IMPRESSION:  1.  No acute intracranial abnormality. 2.  Stable appearance of right PCA and left cerebellar infarcts. 3.  Stable atrophy and microvascular ischemic white matter changes. 4.  Intracranial atherosclerosis  CT CERVICAL SPINE  Findings: No acute fracture, malalignment or prevertebral soft tissue swelling.  No acute soft tissue abnormality.  Mild atherosclerotic vascular calcifications in the carotid arteries. Multilevel degenerative disc disease most significant at C4-C5 for a posterior calcified disc osteophyte complex results in mild central canal narrowing.  There is exaggerated cervical lordosis with crowding of the spinous processes.  This is favored to be degenerative in etiology.  IMPRESSION:  1.  No acute fracture or malalignment. 2.  C4-C5 posterior disc osteophyte complex results in mild central canal stenosis. 3.  Atherosclerosis.   Original Report Authenticated By: Malachy Moan, M.D.   Dg Humerus Left  11/19/12   *RADIOLOGY REPORT*  Clinical Data: Post reduction views of the left humerus  LEFT HUMERUS - 2+ VIEW  Comparison: Nov 19, 2012 at 2:35 a.m.  Findings: The oblique transverse, non comminuted fracture of the midshaft of the left humerus has been slightly reduced.  There is still medial displacement of the distal fracture fragment by one full shaft width, but there is no significant residual angulation. The overlap or foreshortening of this fracture measures just under 1 cm.  There is been no change in the displaced proximal humeral metaphyseal fracture which remains foreshortened and overlapped with the humeral head.  Changes from prior vascular surgery and placement of an axillary/brachial stent are stable.  The bones are  extensively demineralized.  IMPRESSION: Partial reduction of the mid shaft fracture.  No change in the proximal left humeral metaphyseal fracture.   Original Report Authenticated By: Amie Portland, M.D.   Dg Humerus Left  11-19-2012   *RADIOLOGY REPORT*  Clinical  Data: Fall, left arm pain  LEFT HUMERUS - 2+ VIEW  Comparison: Concurrently obtained radiographs of the left shoulder and chest  Findings: Acute displaced fracture of the surgical neck of the humerus with probable impaction.  Acute displaced fracture through the mid humeral diaphysis.  There is one full shaft with displacement of the fracture fragments with slight apex medial angulation of the fracture site.  Overlapping stents noted in the upper arm, likely within a dialysis access.  IMPRESSION:  1.  Acute displaced and likely impacted fracture of the surgical neck of the humerus. 2.  Acute displaced fracture through the mid aspect of the humeral diaphysis with apex medial angulation of the fracture sites and one full shaft with displacement.   Original Report Authenticated By: Malachy Moan, M.D.    EKG: none  Assessment/Plan Principal Problem:   Humerus neck and shaft fracture with displacement Admit to telemetry Closed reduction and splinting of left arm with abduction pillow done by Dr Shelle Iron in ED. Post procedure x ray shows partial reduction of shaft  fracture. Recommend keeping elbow support with abduction pillow Pain control with prn dailuadid and percocet. Monitor for lethargy. IV robaxin prn for spasms. Bowel regimen. PT eval  Active Problems:   Subcapital fracture of femur Recommended for conservation management and NWB per ortho given pt wheelchair bound. PT eval. Pain control as outlined above. She has a left thigh AV graft with good thril. Left hip is flexed and internally rotated. If causes problem with HD will have to ask ortho to evaluate again.  Chronic contracture of left wrist  she has feeble left radial pulse  which is likely chronic. She had a left AV graft previously. ED physician discussed with vascular surgery and was recommended no further intervention as hand was well perfused.  Anemia of chronic disease  stable. Monitor  Hypertension  resume home meds  Cardiomyopathy continue metoprolol and statin. euvolemic  DM A1C of 5.8 discontinued insulin by PCP recently. continue SSI.    End stage renal disease On HD Tu, th and sat. Dr Arlean Hopping informed of admission.    DVT prophylaxis    Code Status: presumed full code Family Communication: none Disposition Plan: return to SNF once stable  Benjerman Molinelli [facility] Pager [phone]    If 7PM-7AM, please contact night-coverage [email or web address] Password [email or web address] 11/18/2012, 8:28 AM    Total time spent: 70 minutes

## 2012-10-25 NOTE — ED Notes (Signed)
IV team paged.  

## 2012-10-25 NOTE — ED Notes (Signed)
Patient to xray at this time

## 2012-10-25 NOTE — ED Notes (Addendum)
Patient returned from CT and xray.

## 2012-10-25 NOTE — ED Notes (Signed)
Ortho tech paged for swath of left arm to body.

## 2012-10-25 NOTE — ED Notes (Signed)
IV team returned phone call.

## 2012-10-25 NOTE — Procedures (Signed)
I was present at this dialysis session. I have reviewed the session itself and made appropriate changes.   [name]  MD Pager [phone]    Cell  [phone] 10/22/2012, 3:15 PM

## 2012-10-25 NOTE — ED Notes (Signed)
Dr.Beane at bedside  

## 2012-10-25 NOTE — Progress Notes (Signed)
ANTICOAGULATION CONSULT NOTE - Initial Consult  Pharmacy Consult for Coumadin Indication: atrial fibrillation  Allergies  Allergen Reactions  . Aspirin     Unknown    . Heparin     SHE is HIT neg; platelets have been normal on low dose heparin at out pt dialysis ; not clear why this is listed   . Minoxidil     Unknown    . Penicillins     Unknown      Patient Measurements:   Heparin Dosing Weight:    Vital Signs: Temp: 97.4 F (36.3 C) (08/16 0325) Temp src: Oral (08/16 0325) BP: 146/52 mmHg (08/16 0906) Pulse Rate: 61 (08/16 0906)  Labs:  Recent Labs  11/03/2012 0147 10/22/2012 0200  HGB  --  10.3*  HCT  --  33.2*  PLT  --  207  LABPROT  --  28.6*  INR  --  2.81*  CREATININE  --  3.51*  TROPONINI <0.30  --     The CrCl is unknown because both a height and weight (above a minimum accepted value) are required for this calculation.   Medical History: Past Medical History  Diagnosis Date  . Atrial fibrillation 04/27/2009  . CAD (coronary artery disease) 05/15/2010  . Hypertension 12/19/2008  . Anemia 05/15/2010  . MRSA bacteremia 11/14/2009    hx  . Cellulitis   . GERD (gastroesophageal reflux disease) 01/26/2010  . Acetabulum fracture   . Contracture of elbow     L arm  . Dementia   . Diabetes mellitus 06/13/2009    on Insulin  . Decubitus ulcer, buttock 02/07/2011    Pt presents with dressing to R buttocks/coccyx.  . Pneumonia   . Blood transfusion   . Stroke ~ 1990's    Left side paralysis.  Left  foot drop., L arm contracted.  Marland Kitchen ESRD on hemodialysis 03/13/11    dialysis at [facility]; TTS,last tx 03/12/11 hemo x 20years  . Depression 08/15/2009  . Hyperlipidemia 06/13/2009  . Secondary hyperparathyroidism (of renal origin) 11/14/2011  . Unspecified constipation 02/16/2011  . Pain in limb 02/16/2011  . Edema 05/22/2010  . Unspecified late effects of cerebrovascular disease 05/11/2009  . Long term (current) use of anticoagulants 02/07/2009   . Acute and subacute bacterial endocarditis 11/14/2009    Medications:  Prescriptions prior to admission  Medication Sig Dispense Refill  . acetaminophen (TYLENOL) 325 MG tablet Take 325 mg by mouth every 4 (four) hours as needed for pain (headache). For headache.      . collagenase (SANTYL) ointment Apply 1 application topically daily.      Marland Kitchen Dextromethorphan-Quinidine (NUEDEXTA) 20-10 MG CAPS Take 1 capsule by mouth 2 (two) times daily.      Marland Kitchen docusate sodium (COLACE) 100 MG capsule Take 100 mg by mouth 2 (two) times daily.       . DULoxetine (CYMBALTA) 60 MG capsule Take 60 mg by mouth daily.      . insulin aspart (NOVOLOG) 100 UNIT/ML injection Inject 5 Units into the skin 2 (two) times daily after a meal. For blood glucose > 200      . insulin glargine (LANTUS) 100 UNIT/ML injection Inject 5 Units into the skin at bedtime.       Marland Kitchen ipratropium-albuterol (DUONEB) 0.5-2.5 (3) MG/3ML SOLN Take 3 mLs by nebulization every 6 (six) hours as needed (for pneumonia).      Marland Kitchen lidocaine-prilocaine (EMLA) cream Apply 1 application topically every hemodialysis. On Tuesday, Thursday, Friday and Saturday      .  metoprolol tartrate (LOPRESSOR) 25 MG tablet Take 25-50 mg by mouth as directed. Take 25 mg once daily Tues, Thurs, Sat. Take 25mg  twice daily Sun, Mon, Wed, Fri      . multivitamin (RENA-VIT) TABS tablet Take 1 tablet by mouth every morning.       . Nutritional Supplements (FEEDING SUPPLEMENT, NEPRO CARB STEADY,) LIQD Take 237 mL by mouth 2 (two) times daily between meals.      Marland Kitchen omeprazole (PRILOSEC) 20 MG capsule Take 40 mg by mouth every morning.       Marland Kitchen oxyCODONE-acetaminophen (PERCOCET/ROXICET) 5-325 MG per tablet Take 1-2 tablets by mouth every 4 (four) hours as needed for pain. For pain.  Give 1 tablet for pain scale 1-5 and give 2 tablets for pain scale 6-10.  240 tablet  0  . simvastatin (ZOCOR) 20 MG tablet Take 20 mg by mouth at bedtime.       Marland Kitchen warfarin (COUMADIN) 3 MG tablet Take 3 mg  by mouth every evening.        Assessment:Fall at [facility] with left humeral shaft fracture and left subcapital femur fracture>>closed reduction and splinting. Femur>>conservative treatment (wheel chair bound).  Anticoagulation: Afib on Coumadin. INR 2.81 this am. NH dose 3mg  daily.  Infectious Disease: Afeb. WBC 6.2. No abx  Cardiovascular: HTN, CM, Afib/CVA, CAD, HLD on metoprolol, Zocor  Endocrinology: DM. On SSI with CBG 279  Gastrointestinal / Nutrition: GERD on PPI  Neurology: mild dementia, CVA, depression. Meds: Cymbalta, Nuedexta (unable to obtain)  Nephrology: ESRD TTS  Hematology / Oncology: Anemia of Chronic dz, HPth,  PTA Medication Issues: Lantus  Best Practices: Coumadin, PPI  Goal of Therapy:  INR 2-3 Monitor platelets by anticoagulation protocol: Yes   Plan:  Continue Coumadin 3mg  daily Daily INR   Mikael Debell S. Merilynn Finland, PharmD, BCPS Clinical Staff Pharmacist Pager [phone]  Misty Stanley Stillinger November 03, 2012,11:19 AM

## 2012-10-25 NOTE — ED Provider Notes (Addendum)
CSN: [id number]     Arrival date & time 11/18/2012  0119 History     First MD Initiated Contact with Patient November 18, 2012 0140     Chief Complaint  Patient presents with  . Fall   (Consider location/radiation/quality/duration/timing/severity/associated sxs/prior Treatment) HPI Comments: Nursing home with apparent fall yesterday. Deformity of left upper arm and left hip. Patient unable to give any history. She is on Coumadin and a dialysis patient. She denies any chest pain or abdominal pain. She denies any back pain. She has chronic contractures of her left elbow.  The history is provided by the patient and the EMS personnel. The history is limited by the condition of the patient.    Past Medical History  Diagnosis Date  . Atrial fibrillation 04/27/2009  . CAD (coronary artery disease) 05/15/2010  . Hypertension 12/19/2008  . Anemia 05/15/2010  . MRSA bacteremia 11/14/2009    hx  . Cellulitis   . GERD (gastroesophageal reflux disease) 01/26/2010  . Acetabulum fracture   . Contracture of elbow     L arm  . Dementia   . Diabetes mellitus 06/13/2009    on Insulin  . Decubitus ulcer, buttock 02/07/2011    Pt presents with dressing to R buttocks/coccyx.  . Pneumonia   . Blood transfusion   . Stroke ~ 1990's    Left side paralysis.  Left  foot drop., L arm contracted.  Marland Kitchen ESRD on hemodialysis 03/13/11    dialysis at [facility]; TTS,last tx 03/12/11 hemo x 20years  . Depression 08/15/2009  . Hyperlipidemia 06/13/2009  . Secondary hyperparathyroidism (of renal origin) 11/14/2011  . Unspecified constipation 02/16/2011  . Pain in limb 02/16/2011  . Edema 05/22/2010  . Unspecified late effects of cerebrovascular disease 05/11/2009  . Long term (current) use of anticoagulants 02/07/2009  . Acute and subacute bacterial endocarditis 11/14/2009   Past Surgical History  Procedure Laterality Date  . Arteriovenous graft placement    . Appendectomy    . Cholecystectomy    . Tubal ligation       bilateral  . Abdominal hysterectomy    . Av fistula placement  02/07/2011    Procedure: INSERTION OF ARTERIOVENOUS (AV) GORE-TEX GRAFT THIGH;  Surgeon: Pryor Ochoa, MD;  Location: [facility] OR;  Service: Vascular;  Laterality: Right;  insertion AVGG right thigh  . Av fistula placement  02/12/2011    Procedure: INSERTION OF ARTERIOVENOUS (AV) GORE-TEX GRAFT THIGH;  Surgeon: Nilda Simmer, MD;  Location: [facility] OR;  Service: Vascular;  Laterality: Right;  . Cataract extraction w/ intraocular lens  implant, bilateral    . Avgg removal  05/24/2011    Procedure: REMOVAL OF ARTERIOVENOUS GORETEX GRAFT (AVGG);  Surgeon: Fransisco Hertz, MD;  Location: [facility] OR;  Service: Vascular;  Laterality: Right;  with bovine patch angioplasty  . Application of wound vac  05/24/2011    Procedure: APPLICATION OF WOUND VAC;  Surgeon: Fransisco Hertz, MD;  Location: [facility] OR;  Service: Vascular;  Laterality: Right;  . Insertion of dialysis catheter  10/03/2011    Procedure: INSERTION OF DIALYSIS CATHETER;  Surgeon: Fransisco Hertz, MD;  Location: [facility] OR;  Service: Vascular;  Laterality: Left;  Insertion left femoral tunneled dialysis catheter  . Removal of a dialysis catheter  01/16/2012    Procedure: REMOVAL OF A DIALYSIS CATHETER;  Surgeon: Fransisco Hertz, MD;  Location: [facility] OR;  Service: Vascular;  Laterality: Left;  . Insertion of dialysis catheter  01/16/2012    Procedure: INSERTION  OF DIALYSIS CATHETER;  Surgeon: Fransisco Hertz, MD;  Location: [facility] OR;  Service: Vascular;  Laterality: Left;   Family History  Problem Relation Age of Onset  . Diabetes Mother   . Hypertension Father   . Anesthesia problems Neg Hx   . Hypotension Neg Hx   . Malignant hyperthermia Neg Hx   . Pseudochol deficiency Neg Hx    History  Substance Use Topics  . Smoking status: Former Smoker -- 0.25 packs/day for 30 years    Types: Cigarettes    Quit date: 03/12/2000  . Smokeless tobacco: Never Used  . Alcohol Use: No     Comment: "last alcohol ~ 1990"    OB History   Grav Para Term Preterm Abortions TAB SAB Ect Mult Living                 Review of Systems  Unable to perform ROS: Dementia    Allergies  Aspirin; Heparin; Minoxidil; and Penicillins  Home Medications   Current Outpatient Rx  Name  Route  Sig  Dispense  Refill  . acetaminophen (TYLENOL) 325 MG tablet   Oral   Take 325 mg by mouth every 4 (four) hours as needed for pain (headache). For headache.         . collagenase (SANTYL) ointment   Topical   Apply 1 application topically daily.         Marland Kitchen Dextromethorphan-Quinidine (NUEDEXTA) 20-10 MG CAPS   Oral   Take 1 capsule by mouth 2 (two) times daily.         Marland Kitchen docusate sodium (COLACE) 100 MG capsule   Oral   Take 100 mg by mouth 2 (two) times daily.          . DULoxetine (CYMBALTA) 60 MG capsule   Oral   Take 60 mg by mouth daily.         . insulin aspart (NOVOLOG) 100 UNIT/ML injection   Subcutaneous   Inject 5 Units into the skin 2 (two) times daily after a meal. For blood glucose > 200         . insulin glargine (LANTUS) 100 UNIT/ML injection   Subcutaneous   Inject 5 Units into the skin at bedtime.          Marland Kitchen ipratropium-albuterol (DUONEB) 0.5-2.5 (3) MG/3ML SOLN   Nebulization   Take 3 mLs by nebulization every 6 (six) hours as needed (for pneumonia).         Marland Kitchen lidocaine-prilocaine (EMLA) cream   Topical   Apply 1 application topically every hemodialysis. On Tuesday, Thursday, Friday and Saturday         . metoprolol tartrate (LOPRESSOR) 25 MG tablet   Oral   Take 25-50 mg by mouth as directed. Take 25 mg once daily Tues, Thurs, Sat. Take 25mg  twice daily Sun, Mon, Wed, Fri         . multivitamin (RENA-VIT) TABS tablet   Oral   Take 1 tablet by mouth every morning.          . Nutritional Supplements (FEEDING SUPPLEMENT, NEPRO CARB STEADY,) LIQD   Oral   Take 237 mL by mouth 2 (two) times daily between meals.         Marland Kitchen omeprazole (PRILOSEC) 20 MG capsule   Oral    Take 40 mg by mouth every morning.          Marland Kitchen oxyCODONE-acetaminophen (PERCOCET/ROXICET) 5-325 MG per tablet   Oral   Take 1-2  tablets by mouth every 4 (four) hours as needed for pain. For pain.  Give 1 tablet for pain scale 1-5 and give 2 tablets for pain scale 6-10.   240 tablet   0   . simvastatin (ZOCOR) 20 MG tablet   Oral   Take 20 mg by mouth at bedtime.          Marland Kitchen warfarin (COUMADIN) 3 MG tablet   Oral   Take 3 mg by mouth every evening.          BP 163/56  Pulse 62  Temp(Src) 97.4 F (36.3 C) (Oral)  Resp 17  SpO2 95% Physical Exam  Constitutional: She appears well-developed and well-nourished. No distress.  HENT:  Head: Normocephalic and atraumatic.  Mouth/Throat: Oropharynx is clear and moist. No oropharyngeal exudate.  Eyes: Conjunctivae and EOM are normal. Pupils are equal, round, and reactive to light.  Neck: Normal range of motion. Neck supple.  Cardiovascular: Normal rate, regular rhythm and normal heart sounds.   No murmur heard. Pulmonary/Chest: Effort normal and breath sounds normal. No respiratory distress.  Abdominal: Soft. There is no tenderness. There is no rebound and no guarding.  Musculoskeletal: Normal range of motion. She exhibits tenderness. She exhibits no edema.  Contracture to L elbow with deformity to L upper arm.  Difficulty to palpate radial pulse Deformity to L hip.  Intact DP and PT pulses  Neurological: She is alert. No cranial nerve deficit.  Skin: Skin is warm.    ED Course   Reduction of fracture Performed by: Glynn Octave Authorized by: Glynn Octave Consent: Verbal consent obtained. Consent given by: patient Patient understanding: patient states understanding of the procedure being performed Patient identity confirmed: verbally with patient Time out: Immediately prior to procedure a "time out" was called to verify the correct patient, procedure, equipment, support staff and site/side marked as required. Local  anesthesia used: no Patient sedated: no Patient tolerance: Patient tolerated the procedure well with no immediate complications. Comments: L humerus   (including critical care time)  Labs Reviewed  CBC WITH DIFFERENTIAL - Abnormal; Notable for the following:    Hemoglobin 10.3 (*)    HCT 33.2 (*)    RDW 19.8 (*)    All other components within normal limits  COMPREHENSIVE METABOLIC PANEL - Abnormal; Notable for the following:    Sodium 131 (*)    Chloride 89 (*)    Glucose, Bld 397 (*)    Creatinine, Ser 3.51 (*)    Albumin 2.4 (*)    Alkaline Phosphatase 166 (*)    GFR calc non Af Amer 11 (*)    GFR calc Af Amer 13 (*)    All other components within normal limits  PROTIME-INR - Abnormal; Notable for the following:    Prothrombin Time 28.6 (*)    INR 2.81 (*)    All other components within normal limits  TROPONIN I   Dg Chest 1 View  10/13/2012   *RADIOLOGY REPORT*  Clinical Data: Fall  CHEST - 1 VIEW  Comparison: Concurrently obtained radiographs of the left shoulder and upper extremity  Findings: Low inspiratory volumes with bibasilar atelectasis. Stable cardiomegaly.  Atherosclerotic calcifications noted in the transverse aorta.  Displaced fracture of the surgical neck of the humerus.  No definite acute rib fracture identified.  Overlapping metallic stents noted in the left upper arm.  The tip of a femoral approach catheter, likely HERO graft projects over the inferior cavoatrial junction.  This fifth  IMPRESSION:  1.  Low inspiratory volumes with bibasilar atelectasis. 2.  Left humeral neck fracture.   Original Report Authenticated By: Malachy Moan, M.D.   Dg Shoulder 1v Left  11/02/2012   *RADIOLOGY REPORT*  Clinical Data: Fall, left arm pain  LEFT SHOULDER - 1 VIEW  Comparison: Concurrently obtained radiographs of the left humerus and chest  Findings: Acute displaced and likely impacted fracture through the surgical neck of the humerus.  Additionally, there is an acute  displaced fracture of the mid humeral diaphysis with a full shaft with displacement of the fracture fragments.  Incidental note is made of overlapping stents in the upper arm.  No acute rib fracture identified.  IMPRESSION:  1.  Acute displaced and impacted fracture through the surgical neck of the left humerus. 2.  Acute fracture through the mid diaphysis of the humerus with one full shaft with displacement of the fracture fragments.   Original Report Authenticated By: Malachy Moan, M.D.   Dg Hip Complete Left  11/05/2012   *RADIOLOGY REPORT*  Clinical Data: Fall, left arm and hip pain  LEFT HIP - COMPLETE 2+ VIEW  Comparison: None.  Findings: The bones are diffusely osteopenic.  Acute impacted subcapital femoral fracture.  The femoral head remains located. Left femoral approach HERO dialysis access.  Multilevel degenerative disc disease in lower lumbar facet arthropathy. Surgical changes of right hip arthroplasty.  Atherosclerotic vascular calcifications.  IMPRESSION:  1.  Acute impacted subcapital femoral fracture on the left. 2.  Surgical changes of prior right hip arthroplasty 3.  Diffuse osteopenia 4.  Left femoral approach HERO dialysis access.   Original Report Authenticated By: Malachy Moan, M.D.   Ct Head Wo Contrast  10/12/2012   *RADIOLOGY REPORT*  Clinical Data:  Fall, history of prior stroke  CT HEAD WITHOUT CONTRAST CT CERVICAL SPINE WITHOUT CONTRAST  Technique:  Multidetector CT imaging of the head and cervical spine was performed following the standard protocol without intravenous contrast.  Multiplanar CT image reconstructions of the cervical spine were also generated.  Comparison:  Most recent prior head CT 01/17/2010  CT HEAD  Findings: No acute intracranial hemorrhage, acute infarction, mass lesion, mass effect, midline shift or hydrocephalus.  Gray-white differentiation is preserved throughout.  Stable appearance of the left cerebellar and right PCA territory infarct with  associated encephalomalacia.  Cortical and cerebellar atrophy is similar compared to prior.  Periventricular white matter hypoattenuation consistent with the sequela of microvascular ischemia is similar to prior.  The globes and orbits are intact.  No focal scalp hematoma or calvarial fracture.  Atherosclerotic calcifications in the bilateral cavernous carotid arteries.  Normal aeration of the mastoid air cells and visualized paranasal sinuses.  IMPRESSION:  1.  No acute intracranial abnormality. 2.  Stable appearance of right PCA and left cerebellar infarcts. 3.  Stable atrophy and microvascular ischemic white matter changes. 4.  Intracranial atherosclerosis  CT CERVICAL SPINE  Findings: No acute fracture, malalignment or prevertebral soft tissue swelling.  No acute soft tissue abnormality.  Mild atherosclerotic vascular calcifications in the carotid arteries. Multilevel degenerative disc disease most significant at C4-C5 for a posterior calcified disc osteophyte complex results in mild central canal narrowing.  There is exaggerated cervical lordosis with crowding of the spinous processes.  This is favored to be degenerative in etiology.  IMPRESSION:  1.  No acute fracture or malalignment. 2.  C4-C5 posterior disc osteophyte complex results in mild central canal stenosis. 3.  Atherosclerosis.   Original Report Authenticated By: Malachy Moan, M.D.  Ct Cervical Spine Wo Contrast  10/24/2012   *RADIOLOGY REPORT*  Clinical Data:  Fall, history of prior stroke  CT HEAD WITHOUT CONTRAST CT CERVICAL SPINE WITHOUT CONTRAST  Technique:  Multidetector CT imaging of the head and cervical spine was performed following the standard protocol without intravenous contrast.  Multiplanar CT image reconstructions of the cervical spine were also generated.  Comparison:  Most recent prior head CT 01/17/2010  CT HEAD  Findings: No acute intracranial hemorrhage, acute infarction, mass lesion, mass effect, midline shift or  hydrocephalus.  Gray-white differentiation is preserved throughout.  Stable appearance of the left cerebellar and right PCA territory infarct with associated encephalomalacia.  Cortical and cerebellar atrophy is similar compared to prior.  Periventricular white matter hypoattenuation consistent with the sequela of microvascular ischemia is similar to prior.  The globes and orbits are intact.  No focal scalp hematoma or calvarial fracture.  Atherosclerotic calcifications in the bilateral cavernous carotid arteries.  Normal aeration of the mastoid air cells and visualized paranasal sinuses.  IMPRESSION:  1.  No acute intracranial abnormality. 2.  Stable appearance of right PCA and left cerebellar infarcts. 3.  Stable atrophy and microvascular ischemic white matter changes. 4.  Intracranial atherosclerosis  CT CERVICAL SPINE  Findings: No acute fracture, malalignment or prevertebral soft tissue swelling.  No acute soft tissue abnormality.  Mild atherosclerotic vascular calcifications in the carotid arteries. Multilevel degenerative disc disease most significant at C4-C5 for a posterior calcified disc osteophyte complex results in mild central canal narrowing.  There is exaggerated cervical lordosis with crowding of the spinous processes.  This is favored to be degenerative in etiology.  IMPRESSION:  1.  No acute fracture or malalignment. 2.  C4-C5 posterior disc osteophyte complex results in mild central canal stenosis. 3.  Atherosclerosis.   Original Report Authenticated By: Malachy Moan, M.D.   Dg Humerus Left  10/15/2012   *RADIOLOGY REPORT*  Clinical Data: Fall, left arm pain  LEFT HUMERUS - 2+ VIEW  Comparison: Concurrently obtained radiographs of the left shoulder and chest  Findings: Acute displaced fracture of the surgical neck of the humerus with probable impaction.  Acute displaced fracture through the mid humeral diaphysis.  There is one full shaft with displacement of the fracture fragments with  slight apex medial angulation of the fracture site.  Overlapping stents noted in the upper arm, likely within a dialysis access.  IMPRESSION:  1.  Acute displaced and likely impacted fracture of the surgical neck of the humerus. 2.  Acute displaced fracture through the mid aspect of the humeral diaphysis with apex medial angulation of the fracture sites and one full shaft with displacement.   Original Report Authenticated By: Malachy Moan, M.D.   1. Humerus fracture, left, closed, initial encounter   2. Closed left hip fracture, initial encounter     MDM  Dementia patient with fall yesterday. Deformity to the upper arm and left hip. No details of fall available.  Patient with dementia and unreliable historian. Decreased Radial pulse on L, but may be chronic with contracture. Hand is warm and well perfused.  CT head and C spine negative. INR 2.8  Patient found to have midshaft humerus fracture as well as surgical neck humerus fracture. Subcapital left hip fracture as well. D/w Dr. Shelle Iron of ortho.  Patient known to Dr. Luiz Blare.  Dr Janee Morn on call for Dr. Luiz Blare defers to Dr. Shelle Iron.  Dr. Shelle Iron has seen the patient and assisted with reduction of left humerus fracture. Her pulse  is somewhat improved. Her pulse is likely diminished at baseline secondary or contractures. Discussed with Dr. early vascular surgery regarding the graft in his arm. He agrees that his hand is well perfused there is nothing additional to do from his standpoint.  D/w Dr. Lovell Sheehan who will admit patient. She is due for dialysis today.    Date: 11-05-12  Rate: 54  Rhythm: normal sinus rhythm  QRS Axis: normal  Intervals: normal  ST/T Wave abnormalities: normal  Conduction Disutrbances:none  Narrative Interpretation:   Old EKG Reviewed: unchanged      Glynn Octave, MD 11-05-2012 1610  Glynn Octave, MD 11/04/12 1227

## 2012-10-25 NOTE — ED Notes (Signed)
Patient with left arm deformity, faint radial pulse felt.  Patient with left hip pain, malalignment noted.  Contracture in left lower arm normal.

## 2012-10-25 NOTE — Consult Note (Signed)
Reason for Consult: Left arm fracture Referring Physician: EDP  Ebony [name] is an 79 y.o. female.  HPI: Larey Seat at [facility] days ago details  Minimal.  Past Medical History  Diagnosis Date  . Atrial fibrillation 04/27/2009  . CAD (coronary artery disease) 05/15/2010  . Hypertension 12/19/2008  . Anemia 05/15/2010  . MRSA bacteremia 11/14/2009    hx  . Cellulitis   . GERD (gastroesophageal reflux disease) 01/26/2010  . Acetabulum fracture   . Contracture of elbow     L arm  . Dementia   . Diabetes mellitus 06/13/2009    on Insulin  . Decubitus ulcer, buttock 02/07/2011    Pt presents with dressing to R buttocks/coccyx.  . Pneumonia   . Blood transfusion   . Stroke ~ 1990's    Left side paralysis.  Left  foot drop., L arm contracted.  Marland Kitchen ESRD on hemodialysis 03/13/11    dialysis at [facility]; TTS,last tx 03/12/11 hemo x 20years  . Depression 08/15/2009  . Hyperlipidemia 06/13/2009  . Secondary hyperparathyroidism (of renal origin) 11/14/2011  . Unspecified constipation 02/16/2011  . Pain in limb 02/16/2011  . Edema 05/22/2010  . Unspecified late effects of cerebrovascular disease 05/11/2009  . Long term (current) use of anticoagulants 02/07/2009  . Acute and subacute bacterial endocarditis 11/14/2009    Past Surgical History  Procedure Laterality Date  . Arteriovenous graft placement    . Appendectomy    . Cholecystectomy    . Tubal ligation      bilateral  . Abdominal hysterectomy    . Av fistula placement  02/07/2011    Procedure: INSERTION OF ARTERIOVENOUS (AV) GORE-TEX GRAFT THIGH;  Surgeon: Pryor Ochoa, MD;  Location: [facility] OR;  Service: Vascular;  Laterality: Right;  insertion AVGG right thigh  . Av fistula placement  02/12/2011    Procedure: INSERTION OF ARTERIOVENOUS (AV) GORE-TEX GRAFT THIGH;  Surgeon: Nilda Simmer, MD;  Location: [facility] OR;  Service: Vascular;  Laterality: Right;  . Cataract extraction w/ intraocular lens  implant, bilateral    . Avgg  removal  05/24/2011    Procedure: REMOVAL OF ARTERIOVENOUS GORETEX GRAFT (AVGG);  Surgeon: Fransisco Hertz, MD;  Location: [facility] OR;  Service: Vascular;  Laterality: Right;  with bovine patch angioplasty  . Application of wound vac  05/24/2011    Procedure: APPLICATION OF WOUND VAC;  Surgeon: Fransisco Hertz, MD;  Location: [facility] OR;  Service: Vascular;  Laterality: Right;  . Insertion of dialysis catheter  10/03/2011    Procedure: INSERTION OF DIALYSIS CATHETER;  Surgeon: Fransisco Hertz, MD;  Location: [facility] OR;  Service: Vascular;  Laterality: Left;  Insertion left femoral tunneled dialysis catheter  . Removal of a dialysis catheter  01/16/2012    Procedure: REMOVAL OF A DIALYSIS CATHETER;  Surgeon: Fransisco Hertz, MD;  Location: [facility] OR;  Service: Vascular;  Laterality: Left;  . Insertion of dialysis catheter  01/16/2012    Procedure: INSERTION OF DIALYSIS CATHETER;  Surgeon: Fransisco Hertz, MD;  Location: [facility] OR;  Service: Vascular;  Laterality: Left;    Family History  Problem Relation Age of Onset  . Diabetes Mother   . Hypertension Father   . Anesthesia problems Neg Hx   . Hypotension Neg Hx   . Malignant hyperthermia Neg Hx   . Pseudochol deficiency Neg Hx     Social History:  reports that she quit smoking about 12 years ago. Her smoking use included Cigarettes. She has a 7.5  pack-year smoking history. She has never used smokeless tobacco. She reports that she does not drink alcohol or use illicit drugs.  Allergies:  Allergies  Allergen Reactions  . Aspirin     Unknown    . Heparin     SHE is HIT neg; platelets have been normal on low dose heparin at out pt dialysis ; not clear why this is listed   . Minoxidil     Unknown    . Penicillins     Unknown      Medications: I have reviewed the patient's current medications.  Results for orders placed during the hospital encounter of November 24, 2012 (from the past 48 hour(s))  TROPONIN I     Status: None   Collection Time    November 24, 2012  1:47 AM      Result  Value Range   Troponin I <0.30  <0.30 ng/mL   Comment:            Due to the release kinetics of cTnI,     a negative result within the first hours     of the onset of symptoms does not rule out     myocardial infarction with certainty.     If myocardial infarction is still suspected,     repeat the test at appropriate intervals.  CBC WITH DIFFERENTIAL     Status: Abnormal   Collection Time    11-24-2012  2:00 AM      Result Value Range   WBC 6.2  4.0 - 10.5 K/uL   RBC 3.93  3.87 - 5.11 MIL/uL   Hemoglobin 10.3 (*) 12.0 - 15.0 g/dL   HCT 86.5 (*) 78.4 - 69.6 %   MCV 84.5  78.0 - 100.0 fL   MCH 26.2  26.0 - 34.0 pg   MCHC 31.0  30.0 - 36.0 g/dL   RDW 29.5 (*) 28.4 - 13.2 %   Platelets 207  150 - 400 K/uL   Neutrophils Relative % 76  43 - 77 %   Neutro Abs 4.7  1.7 - 7.7 K/uL   Lymphocytes Relative 14  12 - 46 %   Lymphs Abs 0.8  0.7 - 4.0 K/uL   Monocytes Relative 9  3 - 12 %   Monocytes Absolute 0.6  0.1 - 1.0 K/uL   Eosinophils Relative 1  0 - 5 %   Eosinophils Absolute 0.1  0.0 - 0.7 K/uL   Basophils Relative 1  0 - 1 %   Basophils Absolute 0.1  0.0 - 0.1 K/uL  COMPREHENSIVE METABOLIC PANEL     Status: Abnormal   Collection Time    11/24/12  2:00 AM      Result Value Range   Sodium 131 (*) 135 - 145 mEq/L   Potassium 3.8  3.5 - 5.1 mEq/L   Chloride 89 (*) 96 - 112 mEq/L   CO2 32  19 - 32 mEq/L   Glucose, Bld 397 (*) 70 - 99 mg/dL   BUN 23  6 - 23 mg/dL   Creatinine, Ser 4.40 (*) 0.50 - 1.10 mg/dL   Calcium 8.7  8.4 - 10.2 mg/dL   Total Protein 8.3  6.0 - 8.3 g/dL   Albumin 2.4 (*) 3.5 - 5.2 g/dL   AST 28  0 - 37 U/L   ALT 18  0 - 35 U/L   Alkaline Phosphatase 166 (*) 39 - 117 U/L   Total Bilirubin 0.6  0.3 - 1.2 mg/dL   GFR  calc non Af Amer 11 (*) >90 mL/min   GFR calc Af Amer 13 (*) >90 mL/min   Comment: (NOTE)     The eGFR has been calculated using the CKD EPI equation.     This calculation has not been validated in all clinical situations.     eGFR's  persistently <90 mL/min signify possible Chronic Kidney     Disease.  PROTIME-INR     Status: Abnormal   Collection Time    10/17/2012  2:00 AM      Result Value Range   Prothrombin Time 28.6 (*) 11.6 - 15.2 seconds   INR 2.81 (*) 0.00 - 1.49    Dg Chest 1 View  10/10/2012   *RADIOLOGY REPORT*  Clinical Data: Fall  CHEST - 1 VIEW  Comparison: Concurrently obtained radiographs of the left shoulder and upper extremity  Findings: Low inspiratory volumes with bibasilar atelectasis. Stable cardiomegaly.  Atherosclerotic calcifications noted in the transverse aorta.  Displaced fracture of the surgical neck of the humerus.  No definite acute rib fracture identified.  Overlapping metallic stents noted in the left upper arm.  The tip of a femoral approach catheter, likely HERO graft projects over the inferior cavoatrial junction.  This fifth  IMPRESSION:  1.  Low inspiratory volumes with bibasilar atelectasis. 2.  Left humeral neck fracture.   Original Report Authenticated By: Malachy Moan, M.D.   Dg Shoulder 1v Left  11/04/2012   *RADIOLOGY REPORT*  Clinical Data: Fall, left arm pain  LEFT SHOULDER - 1 VIEW  Comparison: Concurrently obtained radiographs of the left humerus and chest  Findings: Acute displaced and likely impacted fracture through the surgical neck of the humerus.  Additionally, there is an acute displaced fracture of the mid humeral diaphysis with a full shaft with displacement of the fracture fragments.  Incidental note is made of overlapping stents in the upper arm.  No acute rib fracture identified.  IMPRESSION:  1.  Acute displaced and impacted fracture through the surgical neck of the left humerus. 2.  Acute fracture through the mid diaphysis of the humerus with one full shaft with displacement of the fracture fragments.   Original Report Authenticated By: Malachy Moan, M.D.   Dg Hip Complete Left  10/12/2012   *RADIOLOGY REPORT*  Clinical Data: Fall, left arm and hip pain  LEFT  HIP - COMPLETE 2+ VIEW  Comparison: None.  Findings: The bones are diffusely osteopenic.  Acute impacted subcapital femoral fracture.  The femoral head remains located. Left femoral approach HERO dialysis access.  Multilevel degenerative disc disease in lower lumbar facet arthropathy. Surgical changes of right hip arthroplasty.  Atherosclerotic vascular calcifications.  IMPRESSION:  1.  Acute impacted subcapital femoral fracture on the left. 2.  Surgical changes of prior right hip arthroplasty 3.  Diffuse osteopenia 4.  Left femoral approach HERO dialysis access.   Original Report Authenticated By: Malachy Moan, M.D.   Ct Head Wo Contrast  11/03/2012   *RADIOLOGY REPORT*  Clinical Data:  Fall, history of prior stroke  CT HEAD WITHOUT CONTRAST CT CERVICAL SPINE WITHOUT CONTRAST  Technique:  Multidetector CT imaging of the head and cervical spine was performed following the standard protocol without intravenous contrast.  Multiplanar CT image reconstructions of the cervical spine were also generated.  Comparison:  Most recent prior head CT 01/17/2010  CT HEAD  Findings: No acute intracranial hemorrhage, acute infarction, mass lesion, mass effect, midline shift or hydrocephalus.  Gray-white differentiation is preserved throughout.  Stable appearance of  the left cerebellar and right PCA territory infarct with associated encephalomalacia.  Cortical and cerebellar atrophy is similar compared to prior.  Periventricular white matter hypoattenuation consistent with the sequela of microvascular ischemia is similar to prior.  The globes and orbits are intact.  No focal scalp hematoma or calvarial fracture.  Atherosclerotic calcifications in the bilateral cavernous carotid arteries.  Normal aeration of the mastoid air cells and visualized paranasal sinuses.  IMPRESSION:  1.  No acute intracranial abnormality. 2.  Stable appearance of right PCA and left cerebellar infarcts. 3.  Stable atrophy and microvascular ischemic  white matter changes. 4.  Intracranial atherosclerosis  CT CERVICAL SPINE  Findings: No acute fracture, malalignment or prevertebral soft tissue swelling.  No acute soft tissue abnormality.  Mild atherosclerotic vascular calcifications in the carotid arteries. Multilevel degenerative disc disease most significant at C4-C5 for a posterior calcified disc osteophyte complex results in mild central canal narrowing.  There is exaggerated cervical lordosis with crowding of the spinous processes.  This is favored to be degenerative in etiology.  IMPRESSION:  1.  No acute fracture or malalignment. 2.  C4-C5 posterior disc osteophyte complex results in mild central canal stenosis. 3.  Atherosclerosis.   Original Report Authenticated By: Malachy Moan, M.D.   Ct Cervical Spine Wo Contrast  10/27/2012   *RADIOLOGY REPORT*  Clinical Data:  Fall, history of prior stroke  CT HEAD WITHOUT CONTRAST CT CERVICAL SPINE WITHOUT CONTRAST  Technique:  Multidetector CT imaging of the head and cervical spine was performed following the standard protocol without intravenous contrast.  Multiplanar CT image reconstructions of the cervical spine were also generated.  Comparison:  Most recent prior head CT 01/17/2010  CT HEAD  Findings: No acute intracranial hemorrhage, acute infarction, mass lesion, mass effect, midline shift or hydrocephalus.  Gray-white differentiation is preserved throughout.  Stable appearance of the left cerebellar and right PCA territory infarct with associated encephalomalacia.  Cortical and cerebellar atrophy is similar compared to prior.  Periventricular white matter hypoattenuation consistent with the sequela of microvascular ischemia is similar to prior.  The globes and orbits are intact.  No focal scalp hematoma or calvarial fracture.  Atherosclerotic calcifications in the bilateral cavernous carotid arteries.  Normal aeration of the mastoid air cells and visualized paranasal sinuses.  IMPRESSION:  1.  No  acute intracranial abnormality. 2.  Stable appearance of right PCA and left cerebellar infarcts. 3.  Stable atrophy and microvascular ischemic white matter changes. 4.  Intracranial atherosclerosis  CT CERVICAL SPINE  Findings: No acute fracture, malalignment or prevertebral soft tissue swelling.  No acute soft tissue abnormality.  Mild atherosclerotic vascular calcifications in the carotid arteries. Multilevel degenerative disc disease most significant at C4-C5 for a posterior calcified disc osteophyte complex results in mild central canal narrowing.  There is exaggerated cervical lordosis with crowding of the spinous processes.  This is favored to be degenerative in etiology.  IMPRESSION:  1.  No acute fracture or malalignment. 2.  C4-C5 posterior disc osteophyte complex results in mild central canal stenosis. 3.  Atherosclerosis.   Original Report Authenticated By: Malachy Moan, M.D.   Dg Humerus Left  10/24/2012   *RADIOLOGY REPORT*  Clinical Data: Fall, left arm pain  LEFT HUMERUS - 2+ VIEW  Comparison: Concurrently obtained radiographs of the left shoulder and chest  Findings: Acute displaced fracture of the surgical neck of the humerus with probable impaction.  Acute displaced fracture through the mid humeral diaphysis.  There is one full shaft with displacement  of the fracture fragments with slight apex medial angulation of the fracture site.  Overlapping stents noted in the upper arm, likely within a dialysis access.  IMPRESSION:  1.  Acute displaced and likely impacted fracture of the surgical neck of the humerus. 2.  Acute displaced fracture through the mid aspect of the humeral diaphysis with apex medial angulation of the fracture sites and one full shaft with displacement.   Original Report Authenticated By: Malachy Moan, M.D.    Review of Systems  Musculoskeletal: Positive for joint pain.  Neurological: Positive for focal weakness and weakness.  All other systems reviewed and are  negative.   Blood pressure 167/61, pulse 59, temperature 97.4 F (36.3 C), temperature source Oral, resp. rate 14, SpO2 100.00%. Physical Exam  Constitutional: She appears well-developed.  HENT:  Head: Normocephalic.  Eyes: Pupils are equal, round, and reactive to light.  Neck: Normal range of motion.  Respiratory: Effort normal.  GI: Soft.  Musculoskeletal:  Left upper extremity with fixed contractures of wrist and elbow. Some deformity of humerus. Radial pulse 1+ left 2+ right. Compartments soft. Good capillary refill. Left hip pain with attempted ROM.  Neurological: She is alert.  Skin: Skin is warm and dry.  Psychiatric: She has a normal mood and affect.    Assessment/Plan: 1. Left femoral neck fracture impacted in a nonambulator bedridden. 2. Left humeral neck and shaft fracture moderate  Displacement. Fixed contractures of left wrist and elbow. 3. Diminished radial pulse left. 4. CRF on dialysis. 5. Afib on coumadin INR 2.8 6. Malnutrition  Plan: Performed closed reduction and splinting of left arm with abduction pillow. Still with radial pulse diminished. Probably chronic. Recommend vascular eval with history of graft. Pulse ox on left index on floor. Reposition prn. NWB left hip. May treat conservatively given comorbidities and bedridden. Discussed with EDP and nursing. Important to keep elbow supported with abduction pillow.  Post reduction xrays pending  Myisha Pickerel C November 12, 2012, 6:38 AM

## 2012-10-25 NOTE — Progress Notes (Signed)
Patient ID: LASHANA SPANG, female   DOB: 1929-06-19, 79 y.o.   MRN: [id number] Dr. Shelle Iron, Tomasita Crumble, asked if I could evaluate Ms. Kniss's left arm given her proximal humerus fracture, humeral shaft fracture, and flexion contractures of her left elbow and wrist.  This is quite a complicated situation.  She has a previous graft in that arm originaly for dialysis that is now not used.  She is also bedridden and does not ambulate.  The question is whehter of not this displaced transverse humeral shaft fracture could compromise perfusion to her left hand.  Right now he is in an abduction pillow sling and her hand is warm.  Her fingers show good capillary refill, but her pulses are very weak especially when compared to the opposite side.  Her skin is intact at her humeral shaft and there appears to be no soft tissue compromise.  She will move some of her fingers, but her wrisit and elbow have significant flexion contractures.  On x-ray, her bone quality shows diffuse renal osteopenia, and the likelihood of her healing this fracture is small.  However, the bone may get "sticky" enough to eventually become a stable non-union.  Right now, given her situation, I do not feel that she needs an urgent operation on the humerus, a surgery that would be quite difficult anyway given her elbow and wrist deformities.  A fracture brace may be warranted in this situation to at least keep the humeral shaft fracture better aligned so as to not compromise soft tissue.

## 2012-10-25 NOTE — Consult Note (Signed)
Renal Service / Consult Note Ebony [name] 10/31/2012 Ebony [name] D Requesting Physician:  Dr Gonzella Lex  Reason for Consult:  ESRD patient w fall at SNF and L humerus and L femur fx HPI: The patient is a 79 y.o. year-old with hx of ESRD, DM/HTN, CAD, afib, remote CVA with L hemiparesis who resides in a SNF. She in nonambulatory. She "fell" at SNF days ago apparently, minimal details available, and was sent to ED early this am  where she was found to have two fractures of the L humerus and an acute impacted subcapital L femur fracture.  She has a longstanding contracture of the LUE, she also has a HeRO graft in the L thigh (combination indwelling catheter component connected to SQ goretex graft). Ortho saw pt and did closed partial reduction of the mid shaft humerus fracture. Femur fx to be treated conservatively as pt is bedridden.    Patient was here in Mar 2014 with fever and Strep bovis bacteremia.  She responded to IV abx, pt did not want to pursue colonscopy.  No source of infection was found.   ROS  none  Past Medical History  Past Medical History  Diagnosis Date  . Atrial fibrillation 04/27/2009  . CAD (coronary artery disease) 05/15/2010  . Hypertension 12/19/2008  . Anemia 05/15/2010  . MRSA bacteremia 11/14/2009    hx  . Cellulitis   . GERD (gastroesophageal reflux disease) 01/26/2010  . Acetabulum fracture   . Contracture of elbow     L arm  . Dementia   . Diabetes mellitus 06/13/2009    on Insulin  . Decubitus ulcer, buttock 02/07/2011    Pt presents with dressing to R buttocks/coccyx.  . Pneumonia   . Blood transfusion   . Stroke ~ 1990's    Left side paralysis.  Left  foot drop., L arm contracted.  Marland Kitchen ESRD on hemodialysis 03/13/11    dialysis at [facility]; TTS,last tx 03/12/11 hemo x 20years  . Depression 08/15/2009  . Hyperlipidemia 06/13/2009  . Secondary hyperparathyroidism (of renal origin) 11/14/2011  . Unspecified constipation 02/16/2011  . Pain in  limb 02/16/2011  . Edema 05/22/2010  . Unspecified late effects of cerebrovascular disease 05/11/2009  . Long term (current) use of anticoagulants 02/07/2009  . Acute and subacute bacterial endocarditis 11/14/2009   Past Surgical History  Past Surgical History  Procedure Laterality Date  . Arteriovenous graft placement    . Appendectomy    . Cholecystectomy    . Tubal ligation      bilateral  . Abdominal hysterectomy    . Av fistula placement  02/07/2011    Procedure: INSERTION OF ARTERIOVENOUS (AV) GORE-TEX GRAFT THIGH;  Surgeon: Pryor Ochoa, MD;  Location: [facility] OR;  Service: Vascular;  Laterality: Right;  insertion AVGG right thigh  . Av fistula placement  02/12/2011    Procedure: INSERTION OF ARTERIOVENOUS (AV) GORE-TEX GRAFT THIGH;  Surgeon: Nilda Simmer, MD;  Location: [facility] OR;  Service: Vascular;  Laterality: Right;  . Cataract extraction w/ intraocular lens  implant, bilateral    . Avgg removal  05/24/2011    Procedure: REMOVAL OF ARTERIOVENOUS GORETEX GRAFT (AVGG);  Surgeon: Fransisco Hertz, MD;  Location: [facility] OR;  Service: Vascular;  Laterality: Right;  with bovine patch angioplasty  . Application of wound vac  05/24/2011    Procedure: APPLICATION OF WOUND VAC;  Surgeon: Fransisco Hertz, MD;  Location: [facility] OR;  Service: Vascular;  Laterality: Right;  . Insertion of dialysis  catheter  10/03/2011    Procedure: INSERTION OF DIALYSIS CATHETER;  Surgeon: Fransisco Hertz, MD;  Location: [facility] OR;  Service: Vascular;  Laterality: Left;  Insertion left femoral tunneled dialysis catheter  . Removal of a dialysis catheter  01/16/2012    Procedure: REMOVAL OF A DIALYSIS CATHETER;  Surgeon: Fransisco Hertz, MD;  Location: [facility] OR;  Service: Vascular;  Laterality: Left;  . Insertion of dialysis catheter  01/16/2012    Procedure: INSERTION OF DIALYSIS CATHETER;  Surgeon: Fransisco Hertz, MD;  Location: [facility] OR;  Service: Vascular;  Laterality: Left;   Family History  Family History  Problem Relation Age of Onset   . Diabetes Mother   . Hypertension Father   . Anesthesia problems Neg Hx   . Hypotension Neg Hx   . Malignant hyperthermia Neg Hx   . Pseudochol deficiency Neg Hx    Social History  reports that she quit smoking about 12 years ago. Her smoking use included Cigarettes. She has a 7.5 pack-year smoking history. She has never used smokeless tobacco. She reports that she does not drink alcohol or use illicit drugs. Allergies  Allergies  Allergen Reactions  . Aspirin     Unknown    . Heparin     SHE is HIT neg; platelets have been normal on low dose heparin at out pt dialysis ; not clear why this is listed   . Minoxidil     Unknown    . Penicillins     Unknown     Home medications Prior to Admission medications   Medication Sig Start Date End Date Taking? Authorizing Provider  acetaminophen (TYLENOL) 325 MG tablet Take 325 mg by mouth every 4 (four) hours as needed for pain (headache). For headache.   Yes Historical Provider, MD  collagenase (SANTYL) ointment Apply 1 application topically daily.   Yes Historical Provider, MD  Dextromethorphan-Quinidine (NUEDEXTA) 20-10 MG CAPS Take 1 capsule by mouth 2 (two) times daily.   Yes Historical Provider, MD  docusate sodium (COLACE) 100 MG capsule Take 100 mg by mouth 2 (two) times daily.    Yes Historical Provider, MD  DULoxetine (CYMBALTA) 60 MG capsule Take 60 mg by mouth daily.   Yes Historical Provider, MD  insulin aspart (NOVOLOG) 100 UNIT/ML injection Inject 5 Units into the skin 2 (two) times daily after a meal. For blood glucose > 200   Yes Historical Provider, MD  insulin glargine (LANTUS) 100 UNIT/ML injection Inject 5 Units into the skin at bedtime.    Yes Historical Provider, MD  ipratropium-albuterol (DUONEB) 0.5-2.5 (3) MG/3ML SOLN Take 3 mLs by nebulization every 6 (six) hours as needed (for pneumonia).   Yes Historical Provider, MD  lidocaine-prilocaine (EMLA) cream Apply 1 application topically every hemodialysis. On  Tuesday, Thursday, Friday and Saturday   Yes Historical Provider, MD  metoprolol tartrate (LOPRESSOR) 25 MG tablet Take 25-50 mg by mouth as directed. Take 25 mg once daily Tues, Thurs, Sat. Take 25mg  twice daily Sun, Mon, Wed, Fri   Yes Historical Provider, MD  multivitamin (RENA-VIT) TABS tablet Take 1 tablet by mouth every morning.    Yes Historical Provider, MD  Nutritional Supplements (FEEDING SUPPLEMENT, NEPRO CARB STEADY,) LIQD Take 237 mL by mouth 2 (two) times daily between meals.   Yes Historical Provider, MD  omeprazole (PRILOSEC) 20 MG capsule Take 40 mg by mouth every morning.    Yes Historical Provider, MD  oxyCODONE-acetaminophen (PERCOCET/ROXICET) 5-325 MG per tablet Take 1-2 tablets by  mouth every 4 (four) hours as needed for pain. For pain.  Give 1 tablet for pain scale 1-5 and give 2 tablets for pain scale 6-10. 10/08/12  Yes Claudie Revering, NP  simvastatin (ZOCOR) 20 MG tablet Take 20 mg by mouth at bedtime.    Yes Historical Provider, MD  warfarin (COUMADIN) 3 MG tablet Take 3 mg by mouth every evening.   Yes Historical Provider, MD   Liver Function Tests  Recent Labs Lab 11/07/2012 0200  AST 28  ALT 18  ALKPHOS 166*  BILITOT 0.6  PROT 8.3  ALBUMIN 2.4*   No results found for this basename: LIPASE, AMYLASE,  in the last 168 hours CBC  Recent Labs Lab 11/05/2012 0200  WBC 6.2  NEUTROABS 4.7  HGB 10.3*  HCT 33.2*  MCV 84.5  PLT 207   Basic Metabolic Panel  Recent Labs Lab 10/20/2012 0200  NA 131*  K 3.8  CL 89*  CO2 32  GLUCOSE 397*  BUN 23  CREATININE 3.51*  CALCIUM 8.7   Physical Exam:  Blood pressure 146/52, pulse 61, temperature 97.4 F (36.3 C), temperature source Oral, resp. rate 18, SpO2 91.00%. Gen: elderly AAF, no distress, calm, responds appropriately, no distress Skin: no rash, cyanosis HEENT:  EOMI, sclera anicteric, throat clear Neck: no JVD, no LAN Chest: faint crackles at bases on R, cannot examine L base due to body position CV:  regular, blowing holosyst M at apex, no rub or gallop Abdomen: soft, nontender, +BS, no HSM or ascites Ext: 1+ bilat pretibial edema, no joint effusion, no gangrene/ulcers Neuro: alert, knows she is a dialysis pt, does not know place, year, day or month    Outpatient HD: (TTS [facility]) 4hrs   F180   EDW 64.5kg   2K/2.25Ca Bath    Heparin none  L thigh AVG (?HeRO) EPO 9200 tiw   Hectorol 1ug tiw   Impression: 1. Fall w L hip and humerus fractures- per ortho/primary 2. ESRD, HD TTS 3. HTN/volume- some vol excess on exam, BP up > UF 2-3 kg today 4. Anemia- Hb 10, hold esa for now 5. Sec HPT- phos/PTH in range at center, no binders, cont vit D 6. Hx CVA w L hemiparesis 7. Afib on coumadin, INR 2.8  Plan- HD today, get wt, no hep as at center  [name]  MD Pager [phone]    Cell  [phone] 10/10/2012, 10:54 AM

## 2012-10-25 NOTE — ED Notes (Signed)
Patient fell yesterday am per SNF.  Patient with deformity of left upper arm and left hip pain after fall.

## 2012-10-25 NOTE — ED Notes (Signed)
Faint radial pulse felt in left wrist.

## 2012-10-26 DIAGNOSIS — D638 Anemia in other chronic diseases classified elsewhere: Secondary | ICD-10-CM

## 2012-10-26 DIAGNOSIS — F0390 Unspecified dementia without behavioral disturbance: Secondary | ICD-10-CM

## 2012-10-26 LAB — GLUCOSE, CAPILLARY
Glucose-Capillary: 227 mg/dL — ABNORMAL HIGH (ref 70–99)
Glucose-Capillary: 187 mg/dL — ABNORMAL HIGH (ref 70–99)

## 2012-10-26 LAB — BASIC METABOLIC PANEL
BUN: 14 mg/dL (ref 6–23)
Creatinine, Ser: 3.05 mg/dL — ABNORMAL HIGH (ref 0.50–1.10)
GFR calc Af Amer: 16 mL/min — ABNORMAL LOW (ref >90)
GFR calc non Af Amer: 13 mL/min — ABNORMAL LOW (ref >90)
Potassium: 4.3 mEq/L (ref 3.5–5.1)

## 2012-10-26 LAB — PROTIME-INR
INR: 2.86 — ABNORMAL HIGH (ref 0.00–1.49)
Prothrombin Time: 29 seconds — ABNORMAL HIGH (ref 11.6–15.2)

## 2012-10-26 NOTE — Progress Notes (Signed)
Ebony [name]  MRN: [id number] DOB/Age: [birth year] 79 y.o. Physician: Ebony [name], M.D.      Subjective: Asleep, resting comfortably.   Vital Signs Temp:  [98.1 F (36.7 C)-99.9 F (37.7 C)] 98.2 F (36.8 C) (08/17 0612) Pulse Rate:  [60-81] 61 (08/17 0612) Resp:  [16-32] 18 (08/17 0612) BP: (118-171)/(53-79) 148/63 mmHg (08/17 0612) SpO2:  [95 %-100 %] 100 % (08/17 0612) Weight:  [64 kg (141 lb 1.5 oz)-65.3 kg (143 lb 15.4 oz)] 64 kg (141 lb 1.5 oz) (08/17 0500)  Lab Results  Recent Labs  November 21, 2012 0200  WBC 6.2  HGB 10.3*  HCT 33.2*  PLT 207   BMET  Recent Labs  11/21/2012 0200 10/26/12 0630  NA 131* 139  K 3.8 4.3  CL 89* 97  CO2 32 33*  GLUCOSE 397* 202*  BUN 23 14  CREATININE 3.51* 3.05*  CALCIUM 8.7 9.3   INR  Date Value Range Status  10/26/2012 2.86* 0.00 - 1.49 Final     Exam  Abduction pillow displaced. Chronic severe contractures LUE. Skin intact  Plan Non operative management. Position LUE for comfort. Daily skin checks. Repeat xrays 6 weeks. Ebony [name] M 10/26/2012, 9:42 AM

## 2012-10-26 NOTE — Progress Notes (Signed)
ANTICOAGULATION CONSULT NOTE - Initial Consult  Pharmacy Consult for Coumadin Indication: atrial fibrillation  Allergies  Allergen Reactions  . Aspirin     Unknown    . Heparin     SHE is HIT neg; platelets have been normal on low dose heparin at out pt dialysis ; not clear why this is listed   . Minoxidil     Unknown    . Penicillins     Unknown      Patient Measurements: Weight: 141 lb 1.5 oz (64 kg) Heparin Dosing Weight:    Vital Signs: Temp: 98.2 F (36.8 C) (08/17 0612) BP: 148/63 mmHg (08/17 0612) Pulse Rate: 61 (08/17 0612)  Labs:  Recent Labs  10/12/2012 0147 10/16/2012 0200 10/26/12 0630  HGB  --  10.3*  --   HCT  --  33.2*  --   PLT  --  207  --   LABPROT  --  28.6* 29.0*  INR  --  2.81* 2.86*  CREATININE  --  3.51* 3.05*  TROPONINI <0.30  --   --     The CrCl is unknown because both a height and weight (above a minimum accepted value) are required for this calculation.   Medical History: Past Medical History  Diagnosis Date  . Atrial fibrillation 04/27/2009  . CAD (coronary artery disease) 05/15/2010  . Hypertension 12/19/2008  . Anemia 05/15/2010  . MRSA bacteremia 11/14/2009    hx  . Cellulitis   . GERD (gastroesophageal reflux disease) 01/26/2010  . Acetabulum fracture   . Contracture of elbow     L arm  . Dementia   . Diabetes mellitus 06/13/2009    on Insulin  . Decubitus ulcer, buttock 02/07/2011    Pt presents with dressing to R buttocks/coccyx.  . Pneumonia   . Blood transfusion   . Stroke ~ 1990's    Left side paralysis.  Left  foot drop., L arm contracted.  Marland Kitchen ESRD on hemodialysis 03/13/11    dialysis at [facility]; TTS,last tx 03/12/11 hemo x 20years  . Depression 08/15/2009  . Hyperlipidemia 06/13/2009  . Secondary hyperparathyroidism (of renal origin) 11/14/2011  . Unspecified constipation 02/16/2011  . Pain in limb 02/16/2011  . Edema 05/22/2010  . Unspecified late effects of cerebrovascular disease 05/11/2009  .  Long term (current) use of anticoagulants 02/07/2009  . Acute and subacute bacterial endocarditis 11/14/2009    Medications:  Prescriptions prior to admission  Medication Sig Dispense Refill  . acetaminophen (TYLENOL) 325 MG tablet Take 325 mg by mouth every 4 (four) hours as needed for pain (headache). For headache.      . collagenase (SANTYL) ointment Apply 1 application topically daily.      Marland Kitchen Dextromethorphan-Quinidine (NUEDEXTA) 20-10 MG CAPS Take 1 capsule by mouth 2 (two) times daily.      Marland Kitchen docusate sodium (COLACE) 100 MG capsule Take 100 mg by mouth 2 (two) times daily.       . DULoxetine (CYMBALTA) 60 MG capsule Take 60 mg by mouth daily.      . insulin aspart (NOVOLOG) 100 UNIT/ML injection Inject 5 Units into the skin 2 (two) times daily after a meal. For blood glucose > 200      . insulin glargine (LANTUS) 100 UNIT/ML injection Inject 5 Units into the skin at bedtime.       Marland Kitchen ipratropium-albuterol (DUONEB) 0.5-2.5 (3) MG/3ML SOLN Take 3 mLs by nebulization every 6 (six) hours as needed (for pneumonia).      Marland Kitchen  lidocaine-prilocaine (EMLA) cream Apply 1 application topically every hemodialysis. On Tuesday, Thursday, Friday and Saturday      . metoprolol tartrate (LOPRESSOR) 25 MG tablet Take 25-50 mg by mouth as directed. Take 25 mg once daily Tues, Thurs, Sat. Take 25mg  twice daily Sun, Mon, Wed, Fri      . multivitamin (RENA-VIT) TABS tablet Take 1 tablet by mouth every morning.       . Nutritional Supplements (FEEDING SUPPLEMENT, NEPRO CARB STEADY,) LIQD Take 237 mL by mouth 2 (two) times daily between meals.      Marland Kitchen omeprazole (PRILOSEC) 20 MG capsule Take 40 mg by mouth every morning.       Marland Kitchen oxyCODONE-acetaminophen (PERCOCET/ROXICET) 5-325 MG per tablet Take 1-2 tablets by mouth every 4 (four) hours as needed for pain. For pain.  Give 1 tablet for pain scale 1-5 and give 2 tablets for pain scale 6-10.  240 tablet  0  . simvastatin (ZOCOR) 20 MG tablet Take 20 mg by mouth at  bedtime.       Marland Kitchen warfarin (COUMADIN) 3 MG tablet Take 3 mg by mouth every evening.        Assessment:Fall at [facility] with left humeral shaft fracture and left subcapital femur fracture>>closed reduction and splinting. Femur>>conservative treatment (wheel chair bound).  Anticoagulation: Afib on Coumadin. INR 2.86 this am. NH dose 3mg  daily.  Infectious Disease: Afeb. WBC 6.2. No abx  Cardiovascular: HTN, CM, Afib/CVA, CAD, HLD on metoprolol, Zocor  Endocrinology: DM. On SSI with CBG 147-279  Gastrointestinal / Nutrition: GERD on PPI  Neurology: mild dementia, CVA, depression. Meds: Cymbalta, Nuedexta (unable to obtain)  Nephrology: ESRD TTS  Hematology / Oncology: Anemia of Chronic dz, HPth,  PTA Medication Issues: Lantus  Best Practices: Coumadin, PPI  Goal of Therapy:  INR 2-3 Monitor platelets by anticoagulation protocol: Yes   Plan:  Continue Coumadin 3mg  daily Daily INR   Genesee Nase S. Merilynn Finland, PharmD, BCPS Clinical Staff Pharmacist Pager [phone]  Misty Stanley Stillinger 10/26/2012,11:20 AM

## 2012-10-26 NOTE — Progress Notes (Signed)
Renal Service Daily Progress Note  Subjective: No complaints, in bed  Physical Exam:  Blood pressure 143/71, pulse 85, temperature 98.2 F (36.8 C), temperature source Oral, resp. rate 18, weight 64 kg (141 lb 1.5 oz), SpO2 100.00%. Gen: elderly AAF, in bed, no distress  Neck: no JVD, no LAN  Chest: no rales on rhonchi, poor exam to due poor effort CV: regular, blowing holosyst M at apex, no rub or gallop  Abdomen: soft, nontender, +BS, no HSM or ascites  Ext: no LE edema, LUA contracture Neuro: alert, knows she is a dialysis pt, does not know place, year, day or month Access: L thigh AVG patent  Outpatient HD: (TT[facility])  4hrs    F180    EDW 64.5kg    2K/2.25Ca Bath    Heparin- 0    L thigh HeRO AVG  EPO 9200 tiw     Hectorol 1ug tiw   Impression:  1. Fall w L hip and humerus fractures- per ortho/primary 2. ESRD, HD TTS 3. HTN/volume- at dry wt, cont metoprolol 4. Anemia- Hb 10, hold esa for now 5. Sec HPT- phos/PTH in range at center, no binders, cont vit D 6. Hx CVA w L hemiparesis 7. Afib on coumadin, INR 2.8   Plan- HD tues, per primary otherwise  [name]  MD Pager [phone]    Cell  [phone] 10/26/2012, 12:42 PM    Recent Labs Lab 10/29/2012 0200 11/08/2012 1230 10/26/12 0630  NA 131*  --  139  K 3.8  --  4.3  CL 89*  --  97  CO2 32  --  33*  GLUCOSE 397*  --  202*  BUN 23  --  14  CREATININE 3.51*  --  3.05*  CALCIUM 8.7  --  9.3  PHOS  --  2.7  --     Recent Labs Lab 11/04/2012 0200  AST 28  ALT 18  ALKPHOS 166*  BILITOT 0.6  PROT 8.3  ALBUMIN 2.4*    Recent Labs Lab 10/22/2012 0200  WBC 6.2  NEUTROABS 4.7  HGB 10.3*  HCT 33.2*  MCV 84.5  PLT 207   . collagenase  1 application Topical Daily  . docusate sodium  100 mg Oral BID  . DULoxetine  60 mg Oral Daily  . feeding supplement (NEPRO CARB STEADY)  237 mL Oral BID BM  . insulin aspart  0-9 Units Subcutaneous TID WC  . metoprolol tartrate  25 mg Oral Custom  . metoprolol  tartrate  25 mg Oral Custom  . multivitamin  1 tablet Oral q morning - 10a  . pantoprazole  40 mg Oral Daily  . simvastatin  20 mg Oral QHS  . sodium chloride  3 mL Intravenous Q12H  . traMADol-acetaminophen  2 tablet Oral Q4H  . warfarin  3 mg Oral q1800  . Warfarin - Pharmacist Dosing Inpatient   Does not apply q1800     sodium chloride, acetaminophen, acetaminophen, albuterol, HYDROmorphone (DILAUDID) injection, ipratropium, methocarbamol (ROBAXIN) IV, oxyCODONE-acetaminophen, sodium chloride

## 2012-10-26 NOTE — Progress Notes (Signed)
Patient ID: LEEYA RUSCONI  female  ZOX:[id number]    DOB: [birth year]    DOA: 10/24/2012  PCP: Johny Sax, MD  Assessment/Plan: Principal Problem: Humerus neck and shaft fracture with displacement  -closed reduction and splinting of left arm with abduction pillow done by Dr Shelle Iron in ED.  - Post procedure x ray shows partial reduction of shaft fracture. Cont keeping elbow support with abduction pillow  - pain controlled with prn dailuadid and percocet. IV robaxin prn for spasms.  - PT eval   Active Problems:  Subcapital fracture of femur  - Recommended for conservation management and NWB per ortho given pt wheelchair bound. -  PT eval, Pain control    Chronic contracture of left wrist  - she has feeble left radial pulse which is likely chronic. She had a left AV graft previously. ED physician discussed with vascular surgery and was recommended no further intervention as hand was well perfused.   Anemia of chronic disease stable. Monitor   Hypertension  Cont home meds   Cardiomyopathy  continue metoprolol and statin. euvolemic   DM  A1C of 5.8 discontinued insulin by PCP recently. continue SSI.   End stage renal disease  On HD Tu, th and sat. Renal following  DVT Prophylaxis: On Coumadin  Code Status:  Disposition:    Subjective: Lying comfortably, denies any specific complaints at this time  Objective: Weight change:   Intake/Output Summary (Last 24 hours) at 10/26/12 1350 Last data filed at 10/26/12 1222  Gross per 24 hour  Intake    303 ml  Output   3250 ml  Net  -2947 ml   Blood pressure 143/71, pulse 85, temperature 98.2 F (36.8 C), temperature source Oral, resp. rate 18, weight 64 kg (141 lb 1.5 oz), SpO2 100.00%.  Physical Exam: General: Alert and awake, oriented, not in any acute distress. CVS: S1-S2 clear, holosystolic SEM Chest: clear to auscultation bilaterally, no wheezing, rales or rhonchi Abdomen: soft nontender, nondistended, normal  bowel sounds  Extremities: Left upper extremity contracture with abduction pillow   Lab Results: Basic Metabolic Panel:  Recent Labs Lab 10/16/2012 0200 10/12/2012 1230 10/26/12 0630  NA 131*  --  139  K 3.8  --  4.3  CL 89*  --  97  CO2 32  --  33*  GLUCOSE 397*  --  202*  BUN 23  --  14  CREATININE 3.51*  --  3.05*  CALCIUM 8.7  --  9.3  PHOS  --  2.7  --    Liver Function Tests:  Recent Labs Lab 11/05/2012 0200  AST 28  ALT 18  ALKPHOS 166*  BILITOT 0.6  PROT 8.3  ALBUMIN 2.4*   No results found for this basename: LIPASE, AMYLASE,  in the last 168 hours No results found for this basename: AMMONIA,  in the last 168 hours CBC:  Recent Labs Lab 11/02/2012 0200  WBC 6.2  NEUTROABS 4.7  HGB 10.3*  HCT 33.2*  MCV 84.5  PLT 207   Cardiac Enzymes:  Recent Labs Lab 10/21/2012 0147  TROPONINI <0.30   BNP: No components found with this basename: POCBNP,  CBG:  Recent Labs Lab 10/24/2012 1101 10/20/2012 1743 10/26/2012 2122 10/26/12 0615 10/26/12 1042  GLUCAP 279* 147* 159* 195* 162*     Micro Results: Recent Results (from the past 240 hour(s))  SURGICAL PCR SCREEN     Status: None   Collection Time    10/26/12  6:09 AM  Result Value Range Status   MRSA, PCR NEGATIVE  NEGATIVE Final   Staphylococcus aureus NEGATIVE  NEGATIVE Final   Comment:            The Xpert SA Assay (FDA     approved for NASAL specimens     in patients over 13 years of age),     is one component of     a comprehensive surveillance     program.  Test performance has     been validated by [employer] for patients greater     than or equal to 67 year old.     It is not intended     to diagnose infection nor to     guide or monitor treatment.    Studies/Results: Dg Chest 1 View  Nov 07, 2012   *RADIOLOGY REPORT*  Clinical Data: Fall  CHEST - 1 VIEW  Comparison: Concurrently obtained radiographs of the left shoulder and upper extremity  Findings: Low inspiratory volumes with  bibasilar atelectasis. Stable cardiomegaly.  Atherosclerotic calcifications noted in the transverse aorta.  Displaced fracture of the surgical neck of the humerus.  No definite acute rib fracture identified.  Overlapping metallic stents noted in the left upper arm.  The tip of a femoral approach catheter, likely HERO graft projects over the inferior cavoatrial junction.  This fifth  IMPRESSION:  1.  Low inspiratory volumes with bibasilar atelectasis. 2.  Left humeral neck fracture.   Original Report Authenticated By: Malachy Moan, M.D.   Dg Shoulder 1v Left  11/07/12   *RADIOLOGY REPORT*  Clinical Data: Fall, left arm pain  LEFT SHOULDER - 1 VIEW  Comparison: Concurrently obtained radiographs of the left humerus and chest  Findings: Acute displaced and likely impacted fracture through the surgical neck of the humerus.  Additionally, there is an acute displaced fracture of the mid humeral diaphysis with a full shaft with displacement of the fracture fragments.  Incidental note is made of overlapping stents in the upper arm.  No acute rib fracture identified.  IMPRESSION:  1.  Acute displaced and impacted fracture through the surgical neck of the left humerus. 2.  Acute fracture through the mid diaphysis of the humerus with one full shaft with displacement of the fracture fragments.   Original Report Authenticated By: Malachy Moan, M.D.   Dg Hip Complete Left  11-07-2012   *RADIOLOGY REPORT*  Clinical Data: Fall, left arm and hip pain  LEFT HIP - COMPLETE 2+ VIEW  Comparison: None.  Findings: The bones are diffusely osteopenic.  Acute impacted subcapital femoral fracture.  The femoral head remains located. Left femoral approach HERO dialysis access.  Multilevel degenerative disc disease in lower lumbar facet arthropathy. Surgical changes of right hip arthroplasty.  Atherosclerotic vascular calcifications.  IMPRESSION:  1.  Acute impacted subcapital femoral fracture on the left. 2.  Surgical changes of  prior right hip arthroplasty 3.  Diffuse osteopenia 4.  Left femoral approach HERO dialysis access.   Original Report Authenticated By: Malachy Moan, M.D.   Ct Head Wo Contrast  11-07-2012   *RADIOLOGY REPORT*  Clinical Data:  Fall, history of prior stroke  CT HEAD WITHOUT CONTRAST CT CERVICAL SPINE WITHOUT CONTRAST  Technique:  Multidetector CT imaging of the head and cervical spine was performed following the standard protocol without intravenous contrast.  Multiplanar CT image reconstructions of the cervical spine were also generated.  Comparison:  Most recent prior head CT 01/17/2010  CT HEAD  Findings: No  acute intracranial hemorrhage, acute infarction, mass lesion, mass effect, midline shift or hydrocephalus.  Gray-white differentiation is preserved throughout.  Stable appearance of the left cerebellar and right PCA territory infarct with associated encephalomalacia.  Cortical and cerebellar atrophy is similar compared to prior.  Periventricular white matter hypoattenuation consistent with the sequela of microvascular ischemia is similar to prior.  The globes and orbits are intact.  No focal scalp hematoma or calvarial fracture.  Atherosclerotic calcifications in the bilateral cavernous carotid arteries.  Normal aeration of the mastoid air cells and visualized paranasal sinuses.  IMPRESSION:  1.  No acute intracranial abnormality. 2.  Stable appearance of right PCA and left cerebellar infarcts. 3.  Stable atrophy and microvascular ischemic white matter changes. 4.  Intracranial atherosclerosis  CT CERVICAL SPINE  Findings: No acute fracture, malalignment or prevertebral soft tissue swelling.  No acute soft tissue abnormality.  Mild atherosclerotic vascular calcifications in the carotid arteries. Multilevel degenerative disc disease most significant at C4-C5 for a posterior calcified disc osteophyte complex results in mild central canal narrowing.  There is exaggerated cervical lordosis with crowding of  the spinous processes.  This is favored to be degenerative in etiology.  IMPRESSION:  1.  No acute fracture or malalignment. 2.  C4-C5 posterior disc osteophyte complex results in mild central canal stenosis. 3.  Atherosclerosis.   Original Report Authenticated By: Malachy Moan, M.D.   Ct Cervical Spine Wo Contrast  2012-11-18   *RADIOLOGY REPORT*  Clinical Data:  Fall, history of prior stroke  CT HEAD WITHOUT CONTRAST CT CERVICAL SPINE WITHOUT CONTRAST  Technique:  Multidetector CT imaging of the head and cervical spine was performed following the standard protocol without intravenous contrast.  Multiplanar CT image reconstructions of the cervical spine were also generated.  Comparison:  Most recent prior head CT 01/17/2010  CT HEAD  Findings: No acute intracranial hemorrhage, acute infarction, mass lesion, mass effect, midline shift or hydrocephalus.  Gray-white differentiation is preserved throughout.  Stable appearance of the left cerebellar and right PCA territory infarct with associated encephalomalacia.  Cortical and cerebellar atrophy is similar compared to prior.  Periventricular white matter hypoattenuation consistent with the sequela of microvascular ischemia is similar to prior.  The globes and orbits are intact.  No focal scalp hematoma or calvarial fracture.  Atherosclerotic calcifications in the bilateral cavernous carotid arteries.  Normal aeration of the mastoid air cells and visualized paranasal sinuses.  IMPRESSION:  1.  No acute intracranial abnormality. 2.  Stable appearance of right PCA and left cerebellar infarcts. 3.  Stable atrophy and microvascular ischemic white matter changes. 4.  Intracranial atherosclerosis  CT CERVICAL SPINE  Findings: No acute fracture, malalignment or prevertebral soft tissue swelling.  No acute soft tissue abnormality.  Mild atherosclerotic vascular calcifications in the carotid arteries. Multilevel degenerative disc disease most significant at C4-C5 for a  posterior calcified disc osteophyte complex results in mild central canal narrowing.  There is exaggerated cervical lordosis with crowding of the spinous processes.  This is favored to be degenerative in etiology.  IMPRESSION:  1.  No acute fracture or malalignment. 2.  C4-C5 posterior disc osteophyte complex results in mild central canal stenosis. 3.  Atherosclerosis.   Original Report Authenticated By: Malachy Moan, M.D.   Dg Humerus Left  November 18, 2012   *RADIOLOGY REPORT*  Clinical Data: Post reduction views of the left humerus  LEFT HUMERUS - 2+ VIEW  Comparison: November 18, 2012 at 2:35 a.m.  Findings: The oblique transverse, non comminuted fracture of the midshaft  of the left humerus has been slightly reduced.  There is still medial displacement of the distal fracture fragment by one full shaft width, but there is no significant residual angulation. The overlap or foreshortening of this fracture measures just under 1 cm.  There is been no change in the displaced proximal humeral metaphyseal fracture which remains foreshortened and overlapped with the humeral head.  Changes from prior vascular surgery and placement of an axillary/brachial stent are stable.  The bones are extensively demineralized.  IMPRESSION: Partial reduction of the mid shaft fracture.  No change in the proximal left humeral metaphyseal fracture.   Original Report Authenticated By: Amie Portland, M.D.   Dg Humerus Left  10/21/2012   *RADIOLOGY REPORT*  Clinical Data: Fall, left arm pain  LEFT HUMERUS - 2+ VIEW  Comparison: Concurrently obtained radiographs of the left shoulder and chest  Findings: Acute displaced fracture of the surgical neck of the humerus with probable impaction.  Acute displaced fracture through the mid humeral diaphysis.  There is one full shaft with displacement of the fracture fragments with slight apex medial angulation of the fracture site.  Overlapping stents noted in the upper arm, likely within a dialysis access.   IMPRESSION:  1.  Acute displaced and likely impacted fracture of the surgical neck of the humerus. 2.  Acute displaced fracture through the mid aspect of the humeral diaphysis with apex medial angulation of the fracture sites and one full shaft with displacement.   Original Report Authenticated By: Malachy Moan, M.D.    Medications: Scheduled Meds: . collagenase  1 application Topical Daily  . docusate sodium  100 mg Oral BID  . DULoxetine  60 mg Oral Daily  . feeding supplement (NEPRO CARB STEADY)  237 mL Oral BID BM  . insulin aspart  0-9 Units Subcutaneous TID WC  . metoprolol tartrate  25 mg Oral Custom  . metoprolol tartrate  25 mg Oral Custom  . multivitamin  1 tablet Oral q morning - 10a  . pantoprazole  40 mg Oral Daily  . simvastatin  20 mg Oral QHS  . sodium chloride  3 mL Intravenous Q12H  . traMADol-acetaminophen  2 tablet Oral Q4H  . warfarin  3 mg Oral q1800  . Warfarin - Pharmacist Dosing Inpatient   Does not apply q1800      LOS: 1 day   Zenith Lamphier M.D. [facility] 10/26/2012, 1:50 PM Pager: [phone]  If 7PM-7AM, please contact night-coverage [email or web address] Password [email or web address]

## 2012-10-27 DIAGNOSIS — I4891 Unspecified atrial fibrillation: Secondary | ICD-10-CM

## 2012-10-27 DIAGNOSIS — N186 End stage renal disease: Secondary | ICD-10-CM

## 2012-10-27 DIAGNOSIS — Z515 Encounter for palliative care: Secondary | ICD-10-CM

## 2012-10-27 DIAGNOSIS — I6340 Cerebral infarction due to embolism of unspecified cerebral artery: Secondary | ICD-10-CM

## 2012-10-27 LAB — GLUCOSE, CAPILLARY
Glucose-Capillary: 169 mg/dL — ABNORMAL HIGH (ref 70–99)
Glucose-Capillary: 173 mg/dL — ABNORMAL HIGH (ref 70–99)

## 2012-10-27 LAB — PROTIME-INR
INR: 4.15 — ABNORMAL HIGH (ref 0.00–1.49)
Prothrombin Time: 38.5 seconds — ABNORMAL HIGH (ref 11.6–15.2)

## 2012-10-27 MED ORDER — FENTANYL 12 MCG/HR TD PT72
12.5000 ug | MEDICATED_PATCH | TRANSDERMAL | Status: DC
  Administered 2012-10-27: 12.5 ug via TRANSDERMAL
  Filled 2012-10-27: qty 1

## 2012-10-27 NOTE — Progress Notes (Addendum)
Patient ID: Ebony [name]  female  ZOX:[id number]    DOB: 17-Nov-1929    DOA: 10/12/2012  PCP: Johny Sax, MD  Assessment/Plan: Principal Problem: Humerus neck and shaft fracture with displacement  -closed reduction and splinting of left arm with abduction pillow, ortho following  - Post procedure x ray shows partial reduction of shaft fracture. Cont keeping elbow support with abduction pillow. Reviewed Dr. Eliberto Ivory note, complicated situation, she had a previous graft in that arm which is not used now, has flexion contractures, likelihood of a healing this fracture is small. - Currently curled up in the bed, unable to get any assessment from the patient regarding her pain. At baseline, does not ambulate   Subcapital fracture of femur/left hip  - Recommended for conservation management and NWB per ortho given pt wheelchair bound/bed ridden. -  PT eval, Pain control    Chronic contracture of left wrist  - she has feeble left radial pulse which is likely chronic. She had a left AV graft previously. ED physician discussed with vascular surgery and was recommended no further intervention as hand was well perfused.   Anemia of chronic disease stable. Monitor   Hypertension  Cont home meds   Cardiomyopathy  continue metoprolol and statin. euvolemic   DM  A1C of 5.8 discontinued insulin by PCP recently. continue SSI.   End stage renal disease  On HD Tu, th and sat. Renal following  DVT Prophylaxis: On Coumadin  Code Status:   Disposition: Not operative candidate, with multiple medical problems including ESRD on HD, full code, will need to establish  GOC. Palliative consulted. I called patient's son, Hosie Poisson [phone]) on the phone but I was unable to contact.    Subjective: Lying curled up, denies pain  Objective: Weight change: -2.3 kg (-5 lb 1.1 oz)  Intake/Output Summary (Last 24 hours) at 10/27/12 1115 Last data filed at 10/27/12 0605  Gross per 24 hour   Intake    483 ml  Output      0 ml  Net    483 ml   Blood pressure 139/91, pulse 75, temperature 100 F (37.8 C), temperature source Oral, resp. rate 18, weight 63 kg (138 lb 14.2 oz), SpO2 100.00%.  Physical Exam: General: Alert and awake, curled up in bed CVS: S1-S2 clear, holosystolic SEM Chest: CTAB Abdomen: soft NT, ND, NBS  Extremities: Left upper extremity contracture with abduction pillow   Lab Results: Basic Metabolic Panel:  Recent Labs Lab 10/15/2012 0200 11/03/2012 1230 10/26/12 0630  NA 131*  --  139  K 3.8  --  4.3  CL 89*  --  97  CO2 32  --  33*  GLUCOSE 397*  --  202*  BUN 23  --  14  CREATININE 3.51*  --  3.05*  CALCIUM 8.7  --  9.3  PHOS  --  2.7  --    Liver Function Tests:  Recent Labs Lab 10/19/2012 0200  AST 28  ALT 18  ALKPHOS 166*  BILITOT 0.6  PROT 8.3  ALBUMIN 2.4*   No results found for this basename: LIPASE, AMYLASE,  in the last 168 hours No results found for this basename: AMMONIA,  in the last 168 hours CBC:  Recent Labs Lab 11/09/2012 0200  WBC 6.2  NEUTROABS 4.7  HGB 10.3*  HCT 33.2*  MCV 84.5  PLT 207   Cardiac Enzymes:  Recent Labs Lab 11/04/2012 0147  TROPONINI <0.30   BNP: No components found with  this basename: POCBNP,  CBG:  Recent Labs Lab 10/26/12 0615 10/26/12 1042 10/26/12 1610 10/26/12 2114 10/27/12 0645  GLUCAP 195* 162* 227* 187* 142*     Micro Results: Recent Results (from the past 240 hour(s))  SURGICAL PCR SCREEN     Status: None   Collection Time    10/26/12  6:09 AM      Result Value Range Status   MRSA, PCR NEGATIVE  NEGATIVE Final   Staphylococcus aureus NEGATIVE  NEGATIVE Final   Comment:            The Xpert SA Assay (FDA     approved for NASAL specimens     in patients over 26 years of age),     is one component of     a comprehensive surveillance     program.  Test performance has     been validated by [employer] for patients greater     than or equal to 1 year  old.     It is not intended     to diagnose infection nor to     guide or monitor treatment.    Studies/Results: Dg Chest 1 View  10/31/2012   *RADIOLOGY REPORT*  Clinical Data: Fall  CHEST - 1 VIEW  Comparison: Concurrently obtained radiographs of the left shoulder and upper extremity  Findings: Low inspiratory volumes with bibasilar atelectasis. Stable cardiomegaly.  Atherosclerotic calcifications noted in the transverse aorta.  Displaced fracture of the surgical neck of the humerus.  No definite acute rib fracture identified.  Overlapping metallic stents noted in the left upper arm.  The tip of a femoral approach catheter, likely HERO graft projects over the inferior cavoatrial junction.  This fifth  IMPRESSION:  1.  Low inspiratory volumes with bibasilar atelectasis. 2.  Left humeral neck fracture.   Original Report Authenticated By: Malachy Moan, M.D.   Dg Shoulder 1v Left  11/09/2012   *RADIOLOGY REPORT*  Clinical Data: Fall, left arm pain  LEFT SHOULDER - 1 VIEW  Comparison: Concurrently obtained radiographs of the left humerus and chest  Findings: Acute displaced and likely impacted fracture through the surgical neck of the humerus.  Additionally, there is an acute displaced fracture of the mid humeral diaphysis with a full shaft with displacement of the fracture fragments.  Incidental note is made of overlapping stents in the upper arm.  No acute rib fracture identified.  IMPRESSION:  1.  Acute displaced and impacted fracture through the surgical neck of the left humerus. 2.  Acute fracture through the mid diaphysis of the humerus with one full shaft with displacement of the fracture fragments.   Original Report Authenticated By: Malachy Moan, M.D.   Dg Hip Complete Left  10/28/2012   *RADIOLOGY REPORT*  Clinical Data: Fall, left arm and hip pain  LEFT HIP - COMPLETE 2+ VIEW  Comparison: None.  Findings: The bones are diffusely osteopenic.  Acute impacted subcapital femoral fracture.   The femoral head remains located. Left femoral approach HERO dialysis access.  Multilevel degenerative disc disease in lower lumbar facet arthropathy. Surgical changes of right hip arthroplasty.  Atherosclerotic vascular calcifications.  IMPRESSION:  1.  Acute impacted subcapital femoral fracture on the left. 2.  Surgical changes of prior right hip arthroplasty 3.  Diffuse osteopenia 4.  Left femoral approach HERO dialysis access.   Original Report Authenticated By: Malachy Moan, M.D.   Ct Head Wo Contrast  10/10/2012   *RADIOLOGY REPORT*  Clinical  Data:  Fall, history of prior stroke  CT HEAD WITHOUT CONTRAST CT CERVICAL SPINE WITHOUT CONTRAST  Technique:  Multidetector CT imaging of the head and cervical spine was performed following the standard protocol without intravenous contrast.  Multiplanar CT image reconstructions of the cervical spine were also generated.  Comparison:  Most recent prior head CT 01/17/2010  CT HEAD  Findings: No acute intracranial hemorrhage, acute infarction, mass lesion, mass effect, midline shift or hydrocephalus.  Gray-white differentiation is preserved throughout.  Stable appearance of the left cerebellar and right PCA territory infarct with associated encephalomalacia.  Cortical and cerebellar atrophy is similar compared to prior.  Periventricular white matter hypoattenuation consistent with the sequela of microvascular ischemia is similar to prior.  The globes and orbits are intact.  No focal scalp hematoma or calvarial fracture.  Atherosclerotic calcifications in the bilateral cavernous carotid arteries.  Normal aeration of the mastoid air cells and visualized paranasal sinuses.  IMPRESSION:  1.  No acute intracranial abnormality. 2.  Stable appearance of right PCA and left cerebellar infarcts. 3.  Stable atrophy and microvascular ischemic white matter changes. 4.  Intracranial atherosclerosis  CT CERVICAL SPINE  Findings: No acute fracture, malalignment or prevertebral soft  tissue swelling.  No acute soft tissue abnormality.  Mild atherosclerotic vascular calcifications in the carotid arteries. Multilevel degenerative disc disease most significant at C4-C5 for a posterior calcified disc osteophyte complex results in mild central canal narrowing.  There is exaggerated cervical lordosis with crowding of the spinous processes.  This is favored to be degenerative in etiology.  IMPRESSION:  1.  No acute fracture or malalignment. 2.  C4-C5 posterior disc osteophyte complex results in mild central canal stenosis. 3.  Atherosclerosis.   Original Report Authenticated By: Malachy Moan, M.D.   Ct Cervical Spine Wo Contrast  10/14/2012   *RADIOLOGY REPORT*  Clinical Data:  Fall, history of prior stroke  CT HEAD WITHOUT CONTRAST CT CERVICAL SPINE WITHOUT CONTRAST  Technique:  Multidetector CT imaging of the head and cervical spine was performed following the standard protocol without intravenous contrast.  Multiplanar CT image reconstructions of the cervical spine were also generated.  Comparison:  Most recent prior head CT 01/17/2010  CT HEAD  Findings: No acute intracranial hemorrhage, acute infarction, mass lesion, mass effect, midline shift or hydrocephalus.  Gray-white differentiation is preserved throughout.  Stable appearance of the left cerebellar and right PCA territory infarct with associated encephalomalacia.  Cortical and cerebellar atrophy is similar compared to prior.  Periventricular white matter hypoattenuation consistent with the sequela of microvascular ischemia is similar to prior.  The globes and orbits are intact.  No focal scalp hematoma or calvarial fracture.  Atherosclerotic calcifications in the bilateral cavernous carotid arteries.  Normal aeration of the mastoid air cells and visualized paranasal sinuses.  IMPRESSION:  1.  No acute intracranial abnormality. 2.  Stable appearance of right PCA and left cerebellar infarcts. 3.  Stable atrophy and microvascular  ischemic white matter changes. 4.  Intracranial atherosclerosis  CT CERVICAL SPINE  Findings: No acute fracture, malalignment or prevertebral soft tissue swelling.  No acute soft tissue abnormality.  Mild atherosclerotic vascular calcifications in the carotid arteries. Multilevel degenerative disc disease most significant at C4-C5 for a posterior calcified disc osteophyte complex results in mild central canal narrowing.  There is exaggerated cervical lordosis with crowding of the spinous processes.  This is favored to be degenerative in etiology.  IMPRESSION:  1.  No acute fracture or malalignment. 2.  C4-C5 posterior  disc osteophyte complex results in mild central canal stenosis. 3.  Atherosclerosis.   Original Report Authenticated By: Malachy Moan, M.D.   Dg Humerus Left  2012-10-27   *RADIOLOGY REPORT*  Clinical Data: Post reduction views of the left humerus  LEFT HUMERUS - 2+ VIEW  Comparison: Oct 27, 2012 at 2:35 a.m.  Findings: The oblique transverse, non comminuted fracture of the midshaft of the left humerus has been slightly reduced.  There is still medial displacement of the distal fracture fragment by one full shaft width, but there is no significant residual angulation. The overlap or foreshortening of this fracture measures just under 1 cm.  There is been no change in the displaced proximal humeral metaphyseal fracture which remains foreshortened and overlapped with the humeral head.  Changes from prior vascular surgery and placement of an axillary/brachial stent are stable.  The bones are extensively demineralized.  IMPRESSION: Partial reduction of the mid shaft fracture.  No change in the proximal left humeral metaphyseal fracture.   Original Report Authenticated By: Amie Portland, M.D.   Dg Humerus Left  10-27-2012   *RADIOLOGY REPORT*  Clinical Data: Fall, left arm pain  LEFT HUMERUS - 2+ VIEW  Comparison: Concurrently obtained radiographs of the left shoulder and chest  Findings: Acute  displaced fracture of the surgical neck of the humerus with probable impaction.  Acute displaced fracture through the mid humeral diaphysis.  There is one full shaft with displacement of the fracture fragments with slight apex medial angulation of the fracture site.  Overlapping stents noted in the upper arm, likely within a dialysis access.  IMPRESSION:  1.  Acute displaced and likely impacted fracture of the surgical neck of the humerus. 2.  Acute displaced fracture through the mid aspect of the humeral diaphysis with apex medial angulation of the fracture sites and one full shaft with displacement.   Original Report Authenticated By: Malachy Moan, M.D.    Medications: Scheduled Meds: . collagenase  1 application Topical Daily  . docusate sodium  100 mg Oral BID  . DULoxetine  60 mg Oral Daily  . feeding supplement (NEPRO CARB STEADY)  237 mL Oral BID BM  . insulin aspart  0-9 Units Subcutaneous TID WC  . metoprolol tartrate  25 mg Oral Custom  . metoprolol tartrate  25 mg Oral Custom  . multivitamin  1 tablet Oral q morning - 10a  . pantoprazole  40 mg Oral Daily  . simvastatin  20 mg Oral QHS  . sodium chloride  3 mL Intravenous Q12H  . traMADol-acetaminophen  2 tablet Oral Q4H  . warfarin  3 mg Oral q1800  . Warfarin - Pharmacist Dosing Inpatient   Does not apply q1800      LOS: 2 days   Sanyah Molnar M.D. [facility] 10/27/2012, 11:15 AM Pager: [phone]  If 7PM-7AM, please contact night-coverage [email or web address] Password [email or web address]

## 2012-10-27 NOTE — Progress Notes (Signed)
INITIAL NUTRITION ASSESSMENT  DOCUMENTATION CODES Per approved criteria  -Not Applicable   INTERVENTION: 1. Continue Nepro shakes BID, 8 oz provides 425 kcal, 19 g protein 2. Encourage adequate intake when patient alert  NUTRITION DIAGNOSIS: Inadequate oral intake related to hip fracture as evidenced by 10% intake of meals.   Goal: Patient will meet >/=90% of estimated nutrition needs  Monitor:  PO intake, weights, labs, I/O's  Reason for Assessment: Malnutrition screening  79 y.o. female  Admitting Dx: Humerus shaft fracture  ASSESSMENT: Patient is a skilled nursing resident with history of ESRD on HD, CVA with left side paralysis, DM, and dementia. She was admitted with hip fracture s/p fall. Patient unable to provide much history due to difficulty to arouse. Her intake is generally poor with ~10% intake. She is getting Nepro shakes BID, but unsure if she is drinking any. She does receive these at the nursing home. Per weight history, she has lost about 3% of her usual body weight in 5 months, which is not significant. Weight fluctuations likely due to fluid loss from dialysis.   Height: Ht Readings from Last 1 Encounters:  09/11/12 5\' 2"  (1.575 m)    Weight: Wt Readings from Last 1 Encounters:  10/27/12 138 lb 14.2 oz (63 kg)    Ideal Body Weight: 110 pounds  % Ideal Body Weight: 125%  Wt Readings from Last 10 Encounters:  10/27/12 138 lb 14.2 oz (63 kg)  09/11/12 143 lb (64.864 kg)  06/06/12 144 lb (65.318 kg)  06/03/12 142 lb 6.7 oz (64.6 kg)  05/28/12 147 lb (66.679 kg)  03/30/12 159 lb 6.3 oz (72.3 kg)  01/15/12 141 lb (63.957 kg)  01/15/12 141 lb (63.957 kg)  10/03/11 158 lb 15.2 oz (72.1 kg)  10/03/11 158 lb 15.2 oz (72.1 kg)    Usual Body Weight: 142 pounds   % Usual Body Weight: 97%  BMI:  Body mass index is 25.4 kg/(m^2). Patient is overweight.   Estimated Nutritional Needs: Kcal: 1600-1800 kcal Protein: 75-90 g Fluid: Per MD  Skin: Sacral  decubitus ulcer, pressure ulcer heel  Diet Order: Renal 60/70   EDUCATION NEEDS: -No education needs identified at this time   Intake/Output Summary (Last 24 hours) at 10/27/12 1509 Last data filed at 10/27/12 0605  Gross per 24 hour  Intake    243 ml  Output      0 ml  Net    243 ml    Last BM: PTA   Labs:   Recent Labs Lab 11/08/2012 0200 11/01/2012 1230 10/26/12 0630  NA 131*  --  139  K 3.8  --  4.3  CL 89*  --  97  CO2 32  --  33*  BUN 23  --  14  CREATININE 3.51*  --  3.05*  CALCIUM 8.7  --  9.3  PHOS  --  2.7  --   GLUCOSE 397*  --  202*    CBG (last 3)   Recent Labs  10/26/12 2114 10/27/12 0645 10/27/12 1043  GLUCAP 187* 142* 146*    Scheduled Meds: . collagenase  1 application Topical Daily  . docusate sodium  100 mg Oral BID  . DULoxetine  60 mg Oral Daily  . feeding supplement (NEPRO CARB STEADY)  237 mL Oral BID BM  . insulin aspart  0-9 Units Subcutaneous TID WC  . metoprolol tartrate  25 mg Oral Custom  . metoprolol tartrate  25 mg Oral Custom  . multivitamin  1 tablet Oral q morning - 10a  . pantoprazole  40 mg Oral Daily  . simvastatin  20 mg Oral QHS  . sodium chloride  3 mL Intravenous Q12H  . traMADol-acetaminophen  2 tablet Oral Q4H  . Warfarin - Pharmacist Dosing Inpatient   Does not apply q1800    Continuous Infusions:   Past Medical History  Diagnosis Date  . Atrial fibrillation 04/27/2009  . CAD (coronary artery disease) 05/15/2010  . Hypertension 12/19/2008  . Anemia 05/15/2010  . MRSA bacteremia 11/14/2009    hx  . Cellulitis   . GERD (gastroesophageal reflux disease) 01/26/2010  . Acetabulum fracture   . Contracture of elbow     L arm  . Dementia   . Diabetes mellitus 06/13/2009    on Insulin  . Decubitus ulcer, buttock 02/07/2011    Pt presents with dressing to R buttocks/coccyx.  . Pneumonia   . Blood transfusion   . Stroke ~ 1990's    Left side paralysis.  Left  foot drop., L arm contracted.  Marland Kitchen ESRD on  hemodialysis 03/13/11    dialysis at [facility]; TTS,last tx 03/12/11 hemo x 20years  . Depression 08/15/2009  . Hyperlipidemia 06/13/2009  . Secondary hyperparathyroidism (of renal origin) 11/14/2011  . Unspecified constipation 02/16/2011  . Pain in limb 02/16/2011  . Edema 05/22/2010  . Unspecified late effects of cerebrovascular disease 05/11/2009  . Long term (current) use of anticoagulants 02/07/2009  . Acute and subacute bacterial endocarditis 11/14/2009    Past Surgical History  Procedure Laterality Date  . Arteriovenous graft placement    . Appendectomy    . Cholecystectomy    . Tubal ligation      bilateral  . Abdominal hysterectomy    . Av fistula placement  02/07/2011    Procedure: INSERTION OF ARTERIOVENOUS (AV) GORE-TEX GRAFT THIGH;  Surgeon: Pryor Ochoa, MD;  Location: [facility] OR;  Service: Vascular;  Laterality: Right;  insertion AVGG right thigh  . Av fistula placement  02/12/2011    Procedure: INSERTION OF ARTERIOVENOUS (AV) GORE-TEX GRAFT THIGH;  Surgeon: Nilda Simmer, MD;  Location: [facility] OR;  Service: Vascular;  Laterality: Right;  . Cataract extraction w/ intraocular lens  implant, bilateral    . Avgg removal  05/24/2011    Procedure: REMOVAL OF ARTERIOVENOUS GORETEX GRAFT (AVGG);  Surgeon: Fransisco Hertz, MD;  Location: [facility] OR;  Service: Vascular;  Laterality: Right;  with bovine patch angioplasty  . Application of wound vac  05/24/2011    Procedure: APPLICATION OF WOUND VAC;  Surgeon: Fransisco Hertz, MD;  Location: [facility] OR;  Service: Vascular;  Laterality: Right;  . Insertion of dialysis catheter  10/03/2011    Procedure: INSERTION OF DIALYSIS CATHETER;  Surgeon: Fransisco Hertz, MD;  Location: [facility] OR;  Service: Vascular;  Laterality: Left;  Insertion left femoral tunneled dialysis catheter  . Removal of a dialysis catheter  01/16/2012    Procedure: REMOVAL OF A DIALYSIS CATHETER;  Surgeon: Fransisco Hertz, MD;  Location: [facility] OR;  Service: Vascular;  Laterality: Left;  .  Insertion of dialysis catheter  01/16/2012    Procedure: INSERTION OF DIALYSIS CATHETER;  Surgeon: Fransisco Hertz, MD;  Location: [facility] OR;  Service: Vascular;  Laterality: Left;    Linnell Fulling, RD, LDN Pager #: [phone] After-Hours Pager #: [phone]

## 2012-10-27 NOTE — Progress Notes (Signed)
ANTICOAGULATION CONSULT NOTE - Initial Consult  Pharmacy Consult for Coumadin Indication: atrial fibrillation  Allergies  Allergen Reactions  . Aspirin     Unknown    . Heparin     SHE is HIT neg; platelets have been normal on low dose heparin at out pt dialysis ; not clear why this is listed   . Minoxidil     Unknown    . Penicillins     Unknown      Patient Measurements: Weight: 138 lb 14.2 oz (63 kg) Heparin Dosing Weight:    Vital Signs: Temp: 100 F (37.8 C) (08/18 0603) BP: 139/91 mmHg (08/18 0603) Pulse Rate: 75 (08/18 0603)  Labs:  Recent Labs  11-10-12 0147 11-10-12 0200 10/26/12 0630 10/27/12 1025  HGB  --  10.3*  --   --   HCT  --  33.2*  --   --   PLT  --  207  --   --   LABPROT  --  28.6* 29.0* 38.5*  INR  --  2.81* 2.86* 4.15*  CREATININE  --  3.51* 3.05*  --   TROPONINI <0.30  --   --   --     The CrCl is unknown because both a height and weight (above a minimum accepted value) are required for this calculation.   Medical History: Past Medical History  Diagnosis Date  . Atrial fibrillation 04/27/2009  . CAD (coronary artery disease) 05/15/2010  . Hypertension 12/19/2008  . Anemia 05/15/2010  . MRSA bacteremia 11/14/2009    hx  . Cellulitis   . GERD (gastroesophageal reflux disease) 01/26/2010  . Acetabulum fracture   . Contracture of elbow     L arm  . Dementia   . Diabetes mellitus 06/13/2009    on Insulin  . Decubitus ulcer, buttock 02/07/2011    Pt presents with dressing to R buttocks/coccyx.  . Pneumonia   . Blood transfusion   . Stroke ~ 1990's    Left side paralysis.  Left  foot drop., L arm contracted.  Marland Kitchen ESRD on hemodialysis 03/13/11    dialysis at [facility]; TTS,last tx 03/12/11 hemo x 20years  . Depression 08/15/2009  . Hyperlipidemia 06/13/2009  . Secondary hyperparathyroidism (of renal origin) 11/14/2011  . Unspecified constipation 02/16/2011  . Pain in limb 02/16/2011  . Edema 05/22/2010  . Unspecified late  effects of cerebrovascular disease 05/11/2009  . Long term (current) use of anticoagulants 02/07/2009  . Acute and subacute bacterial endocarditis 11/14/2009    Medications:  Prescriptions prior to admission  Medication Sig Dispense Refill  . acetaminophen (TYLENOL) 325 MG tablet Take 325 mg by mouth every 4 (four) hours as needed for pain (headache). For headache.      . collagenase (SANTYL) ointment Apply 1 application topically daily.      Marland Kitchen Dextromethorphan-Quinidine (NUEDEXTA) 20-10 MG CAPS Take 1 capsule by mouth 2 (two) times daily.      Marland Kitchen docusate sodium (COLACE) 100 MG capsule Take 100 mg by mouth 2 (two) times daily.       . DULoxetine (CYMBALTA) 60 MG capsule Take 60 mg by mouth daily.      . insulin aspart (NOVOLOG) 100 UNIT/ML injection Inject 5 Units into the skin 2 (two) times daily after a meal. For blood glucose > 200      . insulin glargine (LANTUS) 100 UNIT/ML injection Inject 5 Units into the skin at bedtime.       Marland Kitchen ipratropium-albuterol (DUONEB) 0.5-2.5 (3)  MG/3ML SOLN Take 3 mLs by nebulization every 6 (six) hours as needed (for pneumonia).      Marland Kitchen lidocaine-prilocaine (EMLA) cream Apply 1 application topically every hemodialysis. On Tuesday, Thursday, Friday and Saturday      . metoprolol tartrate (LOPRESSOR) 25 MG tablet Take 25-50 mg by mouth as directed. Take 25 mg once daily Tues, Thurs, Sat. Take 25mg  twice daily Sun, Mon, Wed, Fri      . multivitamin (RENA-VIT) TABS tablet Take 1 tablet by mouth every morning.       . Nutritional Supplements (FEEDING SUPPLEMENT, NEPRO CARB STEADY,) LIQD Take 237 mL by mouth 2 (two) times daily between meals.      Marland Kitchen omeprazole (PRILOSEC) 20 MG capsule Take 40 mg by mouth every morning.       Marland Kitchen oxyCODONE-acetaminophen (PERCOCET/ROXICET) 5-325 MG per tablet Take 1-2 tablets by mouth every 4 (four) hours as needed for pain. For pain.  Give 1 tablet for pain scale 1-5 and give 2 tablets for pain scale 6-10.  240 tablet  0  . simvastatin  (ZOCOR) 20 MG tablet Take 20 mg by mouth at bedtime.       Marland Kitchen warfarin (COUMADIN) 3 MG tablet Take 3 mg by mouth every evening.        Assessment:Fall at [facility] with left humeral shaft fracture and left subcapital femur fracture>>closed reduction and splinting. Femur>>conservative treatment (wheel chair bound).  Anticoagulation: Afib on Coumadin. INR up to 4.15? No new CBC.  Infectious Disease: Afeb. WBC 6.2. No abx  Cardiovascular: HTN, CM, Afib/CVA, CAD, HLD on metoprolol, Zocor  Endocrinology: DM. On SSI with CBG 142-227  Gastrointestinal / Nutrition: GERD on PPI  Neurology: mild dementia, CVA, depression. Meds: Cymbalta, Nuedexta (unable to obtain)  Nephrology: ESRD TTS  Hematology / Oncology: Anemia of Chronic dz, HPth,  PTA Medication Issues: Lantus  Best Practices: Coumadin, PPI  Goal of Therapy:  INR 2-3 Monitor platelets by anticoagulation protocol: Yes   Plan:  Hold Coumadin today Daily INR   Ebony [name] S. Merilynn Finland, PharmD, BCPS Clinical Staff Pharmacist Pager [phone]  Ebony [name] 10/27/2012,12:43 PM

## 2012-10-27 NOTE — Progress Notes (Signed)
Orthopedic Tech Progress Note Patient Details:  Ebony [name] 1929/08/15 [id number]  Ortho Devices Type of Ortho Device: Sling immobilizer Ortho Device/Splint Interventions: Application   Cammer, Mickie Bail 10/27/2012, 2:19 PM

## 2012-10-27 NOTE — Progress Notes (Signed)
[facility] Progress Note  Subjective:   Denies pain- curled up on side  Objective Filed Vitals:   10/26/12 1400 10/26/12 2034 10/27/12 0500 10/27/12 0603  BP: 145/65 130/99  139/91  Pulse: 82 73  75  Temp: 98 F (36.7 C) 99.8 F (37.7 C)  100 F (37.8 C)  TempSrc:      Resp: 18 18  18   Weight:   63 kg (138 lb 14.2 oz)   SpO2: 100% 100%  100%   Physical Exam General: curled up lying on right side with pillow propped between legs; appears to be comfortable at rest Heart: RRR Lungs: diminished with poor effort/expansion crackles on right Abdomen: soft Extremities: no sign edema; left heel/foot wrapped; left leg flexed; left arm swollen  in sling Dialysis Access: left thigh HERO graft + bruit  Outpatient HD: (TTS [facility])  4hrs F180 EDW 64.5kg 2K/2.25Ca [place] Heparin- 0 L thigh HeRO AVG  EPO 9200 tiw Hectorol 1ug tiw   Assessment/Plan: 1. Fall with left hip and displaced humerus fx (partial reduction/splint) - nonsurgical candidate; conservative measures;  I foresee issues with pain with mobility due to her these fractures.  She will require hoyer lifting, prolonged sitting in W/C and recliner in order to have outpatient dialysis.  Given these issues and progressive dementia,  Pt has been a full code to date.  Hospice consult would be compassionate and appropriate. I agree 2. ESRD - TTS via thigh AVG - need to try dialysis in recliner to see if it is tolerable. 3. Anemia -  Hgb 10.3 - Aranesp - ordered 4. Secondary hyperparathyroidism - corr Ca ~ 10.5 hectorol 1; binders not needed 5. HTN/volume - CXR NAD on admission; on BB 6. Nutrition - Alb 2.4 renal diet + suppl 7. DM - SSI 8. Hx CVA w/ left paresis and left arm contracture 9. Dementia - baseline 10. Afib - on BB and chornic coumadin  Sheffield Slider, PA-C [facility] Beeper [phone] 10/27/2012,9:43 AM  LOS: 2 days   Patient seen and examined, agree with above note with above  modifications.  79 year old BF with ESRD- now s/p hip and arm fracture after a fall.  Not an operative candidate- I also foresee issues with her always needing to be in the same position with breakdown- I think pall care consult would be appropriate.  HD tomorrow Annie Sable, MD 10/27/2012      Additional Objective Labs: Basic Metabolic Panel:  Recent Labs Lab 11/07/2012 0200 11/02/2012 1230 10/26/12 0630  NA 131*  --  139  K 3.8  --  4.3  CL 89*  --  97  CO2 32  --  33*  GLUCOSE 397*  --  202*  BUN 23  --  14  CREATININE 3.51*  --  3.05*  CALCIUM 8.7  --  9.3  PHOS  --  2.7  --    Liver Function Tests:  Recent Labs Lab 11/04/2012 0200  AST 28  ALT 18  ALKPHOS 166*  BILITOT 0.6  PROT 8.3  ALBUMIN 2.4*   Lab Results  Component Value Date   INR 2.86* 10/26/2012   INR 2.81* 11/07/2012   CBC:  Recent Labs Lab 10/26/2012 0200  WBC 6.2  NEUTROABS 4.7  HGB 10.3*  HCT 33.2*  MCV 84.5  PLT 207  Cardiac Enzymes:  Recent Labs Lab 11/04/2012 0147  TROPONINI <0.30   CBG:  Recent Labs Lab 10/26/12 0615 10/26/12 1042 10/26/12 1610 10/26/12  2114 10/27/12 0645  GLUCAP 195* 162* 227* 187* 142*  Medications:   . collagenase  1 application Topical Daily  . docusate sodium  100 mg Oral BID  . DULoxetine  60 mg Oral Daily  . feeding supplement (NEPRO CARB STEADY)  237 mL Oral BID BM  . insulin aspart  0-9 Units Subcutaneous TID WC  . metoprolol tartrate  25 mg Oral Custom  . metoprolol tartrate  25 mg Oral Custom  . multivitamin  1 tablet Oral q morning - 10a  . pantoprazole  40 mg Oral Daily  . simvastatin  20 mg Oral QHS  . sodium chloride  3 mL Intravenous Q12H  . traMADol-acetaminophen  2 tablet Oral Q4H  . warfarin  3 mg Oral q1800  . Warfarin - Pharmacist Dosing Inpatient   Does not apply [phone]

## 2012-10-27 NOTE — Progress Notes (Signed)
Palliative Medicine Team consult received patient curled in fetal position in bed arouses minimally with little verbal response to this writer or CNA who was attempting to get patient interested in lunch;  Clinical research associate was able to contact son Hosie Poisson @ [phone] - Chase Picket informed Clinical research associate that he is "out of the state of [place] on an oil rig as part of his job"- he can be reached intermittently on this cell number- Chase Picket indicated he welcomed any calls about his mother's care and would try to be available -he stated all he was told was that his mother had fallen and was sent to the hospital- he is agreeable to speaking with PMT and also would like to speak with the attending physician - he stated he could not set a specific time to arrange a phone conference asked that the doctor call and he would either answer or call back as soon as he could Dr Isidoro Donning called and informed of above Dr Phillips Odor PMT will also attempt telephone discussion this afternoon   Valente David, RN 10/27/2012, 12:38 PM Palliative Medicine Team RN Liaison [phone]

## 2012-10-27 NOTE — Progress Notes (Signed)
UR COMPLETED  

## 2012-10-27 NOTE — Progress Notes (Addendum)
I spoke with patient's son Hosie Poisson on the phone in detail about his mother's condition in detail and attempted to determine goals of care-up until this point he had not received any medical information. The patient has 3 son's, but Chase Picket is the only one that she apparently sees or has contact with currently. He is away for the next few weeks working on an oil rig and not available to meet in person.  Summary of Goals:   DNR, his mother was apparently clear about her wishes to not have her life prolonged unnecessarily-he says that she didn't even want to start HD.  She has been at [facility] since 2010 after a stroke left her with a dense hemiparesis. He last saw her 3 weeks ago.  She has an inoperative femur fracture and humerus fracture, issues with pain and contractures. Son desires aggressive pain management.  I expressed to son that her condition is such that she may not be able to tolerate HD in a chair due to her contractures and pain related to her injuries-if that is the situation then her prognosis would be measured in days and Hospice Care would be appropriate.  I strongly encouraged him to come see his mother to put into context her suffering and overall condition, I offered to provide documentation for his job. He thanked me, but did not share plans for his next steps. For now I have agreed to update him by phone.  No feeding tubes, careful hand feeding only.  Duragesic Patch low dose for continuous pain control.  PMT will continue to follow.  Anderson Malta, DO Palliative Medicine'

## 2012-10-28 ENCOUNTER — Inpatient Hospital Stay (HOSPITAL_COMMUNITY): Payer: Medicare Other

## 2012-10-28 LAB — RENAL FUNCTION PANEL
Albumin: 2 g/dL — ABNORMAL LOW (ref 3.5–5.2)
BUN: 42 mg/dL — ABNORMAL HIGH (ref 6–23)
CO2: 25 mEq/L (ref 19–32)
Calcium: 8.7 mg/dL (ref 8.4–10.5)
Chloride: 98 mEq/L (ref 96–112)
Creatinine, Ser: 6.08 mg/dL — ABNORMAL HIGH (ref 0.50–1.10)
GFR calc Af Amer: 7 mL/min — ABNORMAL LOW (ref >90)
GFR calc non Af Amer: 6 mL/min — ABNORMAL LOW (ref >90)
Glucose, Bld: 223 mg/dL — ABNORMAL HIGH (ref 70–99)
Phosphorus: 4.2 mg/dL (ref 2.3–4.6)
Potassium: 4.9 mEq/L (ref 3.5–5.1)
Sodium: 141 mEq/L (ref 135–145)

## 2012-10-28 LAB — CBC
HCT: 34 % — ABNORMAL LOW (ref 36.0–46.0)
Hemoglobin: 10.5 g/dL — ABNORMAL LOW (ref 12.0–15.0)
MCH: 26.1 pg (ref 26.0–34.0)
MCHC: 30.9 g/dL (ref 30.0–36.0)
MCV: 84.6 fL (ref 78.0–100.0)
Platelets: 186 10*3/uL (ref 150–400)
RBC: 4.02 MIL/uL (ref 3.87–5.11)
RDW: 20 % — ABNORMAL HIGH (ref 11.5–15.5)
WBC: 14.1 10*3/uL — ABNORMAL HIGH (ref 4.0–10.5)

## 2012-10-28 LAB — GLUCOSE, CAPILLARY
Glucose-Capillary: 93 mg/dL (ref 70–99)
Glucose-Capillary: 51 mg/dL — ABNORMAL LOW (ref 70–99)
Glucose-Capillary: 87 mg/dL (ref 70–99)

## 2012-10-28 LAB — PROTIME-INR: INR: 4.45 — ABNORMAL HIGH (ref 0.00–1.49)

## 2012-10-28 MED ORDER — LIDOCAINE-PRILOCAINE 2.5-2.5 % EX CREA
1.0000 "application " | TOPICAL_CREAM | CUTANEOUS | Status: DC | PRN
  Filled 2012-10-28: qty 5

## 2012-10-28 MED ORDER — HYDROMORPHONE HCL PF 1 MG/ML IJ SOLN
0.5000 mg | Freq: Four times a day (QID) | INTRAMUSCULAR | Status: DC | PRN
  Administered 2012-10-29: 0.5 mg via INTRAVENOUS
  Filled 2012-10-28: qty 1

## 2012-10-28 MED ORDER — SODIUM CHLORIDE 0.9 % IV SOLN: 100.0000 mL | INTRAVENOUS | Status: DC | PRN

## 2012-10-28 MED ORDER — SODIUM CHLORIDE 0.9 % IV BOLUS (SEPSIS)
500.0000 mL | Freq: Once | INTRAVENOUS | Status: AC
Start: 2012-10-28 — End: 2012-10-28
  Administered 2012-10-28: 500 mL via INTRAVENOUS

## 2012-10-28 MED ORDER — DEXTROSE 50 % IV SOLN
INTRAVENOUS | Status: AC
  Administered 2012-10-28: 50 mL
  Filled 2012-10-28: qty 50

## 2012-10-28 MED ORDER — DARBEPOETIN ALFA-POLYSORBATE 60 MCG/0.3ML IJ SOLN: 60.0000 ug | INTRAMUSCULAR | Status: DC

## 2012-10-28 MED ORDER — DEXTROSE 5 % IV SOLN
2.0000 g | INTRAVENOUS | Status: DC
  Administered 2012-10-28: 2 g via INTRAVENOUS
  Filled 2012-10-28: qty 2

## 2012-10-28 MED ORDER — HYDROMORPHONE HCL PF 1 MG/ML IJ SOLN
INTRAMUSCULAR | Status: AC
  Filled 2012-10-28: qty 1

## 2012-10-28 MED ORDER — VANCOMYCIN HCL 500 MG IV SOLR: 500.0000 mg | INTRAVENOUS | Status: DC

## 2012-10-28 MED ORDER — DEXTROSE 5 % IV SOLN: INTRAVENOUS | Status: DC

## 2012-10-28 MED ORDER — HYDROMORPHONE HCL PF 1 MG/ML IJ SOLN: 0.5000 mg | INTRAMUSCULAR | Status: DC | PRN

## 2012-10-28 MED ORDER — VANCOMYCIN HCL 10 G IV SOLR
1250.0000 mg | Freq: Once | INTRAVENOUS | Status: AC
  Administered 2012-10-28: 1250 mg via INTRAVENOUS
  Filled 2012-10-28: qty 1250

## 2012-10-28 MED ORDER — DIGOXIN 0.25 MG/ML IJ SOLN
0.1250 mg | Freq: Once | INTRAMUSCULAR | Status: AC
  Administered 2012-10-28: 0.125 mg via INTRAVENOUS
  Filled 2012-10-28: qty 0.5

## 2012-10-28 MED ORDER — DEXTROSE 50 % IV SOLN
50.0000 mL | Freq: Once | INTRAVENOUS | Status: AC | PRN
  Administered 2012-10-28 – 2012-10-29 (×2): 25 mL via INTRAVENOUS
  Filled 2012-10-28: qty 50

## 2012-10-28 MED ORDER — NEPRO/CARBSTEADY PO LIQD
237.0000 mL | ORAL | Status: DC | PRN
  Filled 2012-10-28: qty 237

## 2012-10-28 MED ORDER — PENTAFLUOROPROP-TETRAFLUOROETH EX AERO: 1.0000 "application " | INHALATION_SPRAY | CUTANEOUS | Status: DC | PRN

## 2012-10-28 MED ORDER — LIDOCAINE HCL (PF) 1 % IJ SOLN: 5.0000 mL | INTRAMUSCULAR | Status: DC | PRN

## 2012-10-28 NOTE — Procedures (Signed)
Patient was seen on dialysis and the procedure was supervised.  BFR 400  Via AVG BP is  112/62.   Patient appears to be tolerating treatment well  Vishnu Moeller A 10/28/2012

## 2012-10-28 NOTE — Progress Notes (Signed)
Hypoglycemic Event  CBG: 23  Treatment: D50 IV 50 mL  Symptoms: Sweaty  Follow-up CBG: Time:1615 CBG Result:93  Possible Reasons for Event: Inadequate meal intake  Comments/MD notified:not alert enough to take in po.     Annice Needy E  Remember to initiate Hypoglycemia Order Set & complete

## 2012-10-28 NOTE — Consult Note (Addendum)
Palliative Medicine Team at [facility]  Date: 10/28/2012   Patient Name: Ebony [name]  DOB: [birth year]  MRN: [id number]  Age / Sex: 79 y.o., female   PCP: Ginnie Smart, MD Referring Physician: Cathren Harsh, MD  HPI/Reason for Consultation: This is an 79 year old woman with multiple chronic end-stage medical problems including end-stage renal disease, general debility/Lear to thrive following a stroke in 2010, immobility, acute humerus and hip fractures lead to this current hospitalization. She has previously been a resident at [facility] skilled nursing facility with a very poor functional status at baseline. Following her extensive nonoperative fractures she has had a delirium and has not shown significant signs of improvement- her overall ability to heal these fractures and to improve are very poor. At this point she is unable to have hemodialysis in a recliner and goes of care discussion has been requested by renal and the primary service.  Participants in Discussion: Spoke with her son Hosie Poisson over the phone, she has 2 other sons who are unavailable to assist with her care.  Goals/Summary of Discussion:  1. Code Status:  DO NOT RESUSCITATE  2. Scope of Treatment:  I discussed with her son in detail her very serious current condition and the possibility that she may not survive this hospitalization. The main issue is that due to her injuries and debility she is unable to have the dialysis in a recliner outpatient setting. She is also quite debilitated and has a progressive dementia and to continue hemodialysis would contribute to a much higher level of suffering and poor quality of life. I discussed with son that if we are unable to do hemodialysis that her life would be measured in days her death would be imminent. While he understood he did not endorse or discuss a plan to travel back to [place] to assist his mother- he was still very much processing information that I  had given him. He stated that he saw his mother 3 weeks ago and was going to take her out to lunch and that this is a dramatic and unexpected decline. Upon my assessment she is contracted and debilitated at baseline and that her problems are not all attributable to her acute fractures. I expressed my concern that his mother was approaching end-of-life. He stated that he understood he was going to get in touch with his brothers and that we would continue our current level of medical interventions until full comfort care to be decided upon.  Maintain current level of care in addition to comfort care.   3. Assessment/Plan:  Primary Diagnoses  1. Acute humerus and hip fractures 2. Failure to thrive 3. End-stage renal disease dependent on hemodialysis 4. History of CVA with left hemiparesis with left arm and leg contractures 5. Atrial fibrillation 6. Fever 7. Delirium, probably acute on chronic delirium 8. Bedbound status  Prognosis: Hemodialysis her prognosis is days, however if we continue with hemodialysis her prognosis is still quite limited and most certainly she would have a very poor quality of life-to continue to do dialysis with her in the bed would have to be done at a facility such as an LTAC and am not certain that her other problems would be amenable to that level of care. Agree with renal that the humane and compassionate thing to do in this situation is to discontinue hemodialysis from a medical point of view and to inform the family that this patient is at end-of-life and provide full comfort care.  PPS  10%    Active Symptoms 1. Pains secondary to acute fractures 2. Immobility 3. Generalized weakness 4. Failure to thrive 5. Dysphasia  4. Palliative Prophylaxis:   Bowel Regimen -yes  Terminal Secretions-NA  Breakthrough Pain and Dyspnea- hydromorphone when necessary   Agitation and Delirium- Ativan  Nausea- Zofran  5. Psychosocial Spiritual  Asssessment/Interventions:  Patient and Family Adjustment to Illness/Prognosis: Patient's son handled the information well, he is struggling the case he is out of town on a work assignment for the next 3 weeks, offered to assist with his job in providing documentation of his mother's critical life limiting illness.  Spiritual Concerns or Needs: Unexpressed at this time  6. Disposition: The patient would most certainly be residential hospice appropriate if she is no longer able to undergo hemodialysis.  ROS:  Unable to obtain from patient Social History:   reports that she quit smoking about 12 years ago. Her smoking use included Cigarettes. She has a 7.5 pack-year smoking history. She has never used smokeless tobacco. She reports that she does not drink alcohol or use illicit drugs.  skilled nursing facility  Family History: Family History  Problem Relation Age of Onset  . Diabetes Mother   . Hypertension Father   . Anesthesia problems Neg Hx   . Hypotension Neg Hx   . Malignant hyperthermia Neg Hx   . Pseudochol deficiency Neg Hx     Active Medications:  Outpatient medications: Prescriptions prior to admission  Medication Sig Dispense Refill  . acetaminophen (TYLENOL) 325 MG tablet Take 325 mg by mouth every 4 (four) hours as needed for pain (headache). For headache.      . collagenase (SANTYL) ointment Apply 1 application topically daily.      Marland Kitchen Dextromethorphan-Quinidine (NUEDEXTA) 20-10 MG CAPS Take 1 capsule by mouth 2 (two) times daily.      Marland Kitchen docusate sodium (COLACE) 100 MG capsule Take 100 mg by mouth 2 (two) times daily.       . DULoxetine (CYMBALTA) 60 MG capsule Take 60 mg by mouth daily.      . insulin aspart (NOVOLOG) 100 UNIT/ML injection Inject 5 Units into the skin 2 (two) times daily after a meal. For blood glucose > 200      . insulin glargine (LANTUS) 100 UNIT/ML injection Inject 5 Units into the skin at bedtime.       Marland Kitchen ipratropium-albuterol (DUONEB) 0.5-2.5  (3) MG/3ML SOLN Take 3 mLs by nebulization every 6 (six) hours as needed (for pneumonia).      Marland Kitchen lidocaine-prilocaine (EMLA) cream Apply 1 application topically every hemodialysis. On Tuesday, Thursday, Friday and Saturday      . metoprolol tartrate (LOPRESSOR) 25 MG tablet Take 25-50 mg by mouth as directed. Take 25 mg once daily Tues, Thurs, Sat. Take 25mg  twice daily Sun, Mon, Wed, Fri      . multivitamin (RENA-VIT) TABS tablet Take 1 tablet by mouth every morning.       . Nutritional Supplements (FEEDING SUPPLEMENT, NEPRO CARB STEADY,) LIQD Take 237 mL by mouth 2 (two) times daily between meals.      Marland Kitchen omeprazole (PRILOSEC) 20 MG capsule Take 40 mg by mouth every morning.       Marland Kitchen oxyCODONE-acetaminophen (PERCOCET/ROXICET) 5-325 MG per tablet Take 1-2 tablets by mouth every 4 (four) hours as needed for pain. For pain.  Give 1 tablet for pain scale 1-5 and give 2 tablets for pain scale 6-10.  240 tablet  0  . simvastatin (ZOCOR) 20  MG tablet Take 20 mg by mouth at bedtime.       Marland Kitchen warfarin (COUMADIN) 3 MG tablet Take 3 mg by mouth every evening.        Current medications: Infusions:    Scheduled Medications: . collagenase  1 application Topical Daily  . [START ON 11/15/2012] darbepoetin (ARANESP) injection - DIALYSIS  60 mcg Intravenous Q Thu-HD  . docusate sodium  100 mg Oral BID  . DULoxetine  60 mg Oral Daily  . feeding supplement (NEPRO CARB STEADY)  237 mL Oral BID BM  . fentaNYL  12.5 mcg Transdermal Q72H  . insulin aspart  0-9 Units Subcutaneous TID WC  . multivitamin  1 tablet Oral q morning - 10a  . pantoprazole  40 mg Oral Daily  . sodium chloride  3 mL Intravenous Q12H  . Warfarin - Pharmacist Dosing Inpatient   Does not apply q1800    PRN Medications: sodium chloride, acetaminophen, acetaminophen, albuterol, HYDROmorphone (DILAUDID) injection, ipratropium, methocarbamol (ROBAXIN) IV, sodium chloride   Vital Signs: BP 86/39  Pulse 96  Temp(Src) 98.3 F (36.8 C)  (Axillary)  Resp 16  Wt 59.8 kg (131 lb 13.4 oz)  BMI 24.11 kg/m2  SpO2 95%   Physical Exam:  Frail contracted elderly woman in mild to moderate distress, lightly agitated and confused. Course lung sounds. Labs:  Basic or Comprehensive Metabolic Panel:    Component Value Date/Time   NA 141 10/28/2012 0800   K 4.9 10/28/2012 0800   CL 98 10/28/2012 0800   CO2 25 10/28/2012 0800   BUN 42* 10/28/2012 0800   CREATININE 6.08* 10/28/2012 0800   GLUCOSE 223* 10/28/2012 0800   CALCIUM 8.7 10/28/2012 0800   CALCIUM 8.6 07/07/2009 1725   AST 28 10/29/2012 0200   ALT 18 11/04/2012 0200   ALKPHOS 166* 10/17/2012 0200   BILITOT 0.6 10/19/2012 0200   PROT 8.3 10/27/2012 0200   ALBUMIN 2.0* 10/28/2012 0800     CBC:    Component Value Date/Time   WBC 14.1* 10/28/2012 0759   HGB 10.5* 10/28/2012 0759   HCT 34.0* 10/28/2012 0759   PLT 186 10/28/2012 0759   MCV 84.6 10/28/2012 0759   NEUTROABS 4.7 10/17/2012 0200   LYMPHSABS 0.8 10/19/2012 0200   MONOABS 0.6 10/11/2012 0200   EOSABS 0.1 11/07/2012 0200   BASOSABS 0.1 10/12/2012 0200     BNP (last 3 results) No results found for this basename: PROBNP,  in the last 8760 hours  CBG (last 3)   Recent Labs  10/27/12 1628 10/27/12 2145 10/28/12 0503  GLUCAP 169* 173* 212*    Imaging:  No results found.  Other Data:  (2D echo, EKG...)   Educational Materials Given:  DNR: Yes  MOST: No Healthcare Power-of-Attorney: No   Time: 50 minutes Greater than 50%  of this time was spent counseling and coordinating care related to the above assessment and plan.  Signed by: Edsel Petrin, DO  10/28/2012, 1:50 PM  Please contact Palliative Medicine Team phone at [phone] for questions and concerns.

## 2012-10-28 NOTE — Progress Notes (Signed)
ANTIBIOTIC CONSULT NOTE - INITIAL  Pharmacy Consult for Vancomycin and Cefepime Indication: rule out pneumonia and rule out sepsis  Allergies  Allergen Reactions  . Aspirin     Unknown    . Heparin     SHE is HIT neg; platelets have been normal on low dose heparin at out pt dialysis ; not clear why this is listed   . Minoxidil     Unknown    . Penicillins     Unknown      Patient Measurements: Weight: 131 lb 13.4 oz (59.8 kg)  Vital Signs: Temp: 98.3 F (36.8 C) (08/19 1250) Temp src: Axillary (08/19 1250) BP: 88/44 mmHg (08/19 1415) Pulse Rate: 93 (08/19 1415) Intake/Output from previous day: 08/18 0701 - 08/19 0700 In: 220 [P.O.:220] Out: -  Intake/Output from this shift: Total I/O In: -  Out: -544   Labs:  Recent Labs  10/26/12 0630 10/28/12 0759 10/28/12 0800  WBC  --  14.1*  --   HGB  --  10.5*  --   PLT  --  186  --   CREATININE 3.05*  --  6.08*   Pt has ESRD and receives HD TTS.  Microbiology: Recent Results (from the past 720 hour(s))  SURGICAL PCR SCREEN     Status: None   Collection Time    10/26/12  6:09 AM      Result Value Range Status   MRSA, PCR NEGATIVE  NEGATIVE Final   Staphylococcus aureus NEGATIVE  NEGATIVE Final   Comment:            The Xpert SA Assay (FDA     approved for NASAL specimens     in patients over 47 years of age),     is one component of     a comprehensive surveillance     program.  Test performance has     been validated by [employer] for patients greater     than or equal to 72 year old.     It is not intended     to diagnose infection nor to     guide or monitor treatment.    Medical History: Past Medical History  Diagnosis Date  . Atrial fibrillation 04/27/2009  . CAD (coronary artery disease) 05/15/2010  . Hypertension 12/19/2008  . Anemia 05/15/2010  . MRSA bacteremia 11/14/2009    hx  . Cellulitis   . GERD (gastroesophageal reflux disease) 01/26/2010  . Acetabulum fracture   .  Contracture of elbow     L arm  . Dementia   . Diabetes mellitus 06/13/2009    on Insulin  . Decubitus ulcer, buttock 02/07/2011    Pt presents with dressing to R buttocks/coccyx.  . Pneumonia   . Blood transfusion   . Stroke ~ 1990's    Left side paralysis.  Left  foot drop., L arm contracted.  Marland Kitchen ESRD on hemodialysis 03/13/11    dialysis at [facility]; TTS,last tx 03/12/11 hemo x 20years  . Depression 08/15/2009  . Hyperlipidemia 06/13/2009  . Secondary hyperparathyroidism (of renal origin) 11/14/2011  . Unspecified constipation 02/16/2011  . Pain in limb 02/16/2011  . Edema 05/22/2010  . Unspecified late effects of cerebrovascular disease 05/11/2009  . Long term (current) use of anticoagulants 02/07/2009  . Acute and subacute bacterial endocarditis 11/14/2009    Medications:  Scheduled:  . collagenase  1 application Topical Daily  . [START ON 11-29-12] darbepoetin (ARANESP) injection - DIALYSIS  60 mcg Intravenous Q Thu-HD  . digoxin  0.125 mg Intravenous Once  . docusate sodium  100 mg Oral BID  . DULoxetine  60 mg Oral Daily  . feeding supplement (NEPRO CARB STEADY)  237 mL Oral BID BM  . fentaNYL  12.5 mcg Transdermal Q72H  . insulin aspart  0-9 Units Subcutaneous TID WC  . multivitamin  1 tablet Oral q morning - 10a  . pantoprazole  40 mg Oral Daily  . sodium chloride  3 mL Intravenous Q12H  . Warfarin - Pharmacist Dosing Inpatient   Does not apply q1800   Assessment: 79 yo F admitted after fall with humerus and femur fractures.  Pt has PMH significant for ESRD and CVA for which palliative care is involved to address goals of care with concern that patient can no longer transfer and sit in a recliner for outpt dialysis as a result of injuries.  Over the last few hours patient has developed increased WBC and T 101.3.  Rx asked to dose abx for r/o sepsis and/or HCAP.    Original MD order was for Vancomycin and Zosyn but pt has reported PCN allergy.  Pt was essentially  noncommunitive to me and could not confirm allergy or reaction.  In review of the EMR, the patient has received Zosyn as well as several Cephalosporins in the past.  I communicated this information with Dr. Isidoro Donning and the decision was made to change the antibiotics to Vancomycin and Cefepime.  Goal of Therapy:  Vancomycin trough level 15-20 mcg/ml  Plan:  Vancomycin 1250 mg IV x 1 dose. Vancomycin 500 mg IV with each HD TTS, next dose 10/10/2012. Cefepime 2gm IV x 1 now followed by 2gm IV qHD.  [employer], Pharm.D., BCPS Clinical Pharmacist Pager [phone] 10/28/2012 3:43 PM

## 2012-10-28 NOTE — Progress Notes (Addendum)
[facility] Progress Note  Subjective:   Seen on HD- response to question  Anything I can do?  "mostly, yea" Very much appreciate palliative care input- I think we are on the same page, pt now DNR.  Pt had fever last night as well  Objective Filed Vitals:   10/27/12 2109 10/28/12 0538 10/28/12 0720 10/28/12 0744  BP: 131/60 116/60 108/65 112/62  Pulse: 99 82 79 95  Temp: 101.3 F (38.5 C) 98.7 F (37.1 C) 99.1 F (37.3 C)   TempSrc:  Oral Oral   Resp:  18 17 18   Weight:   59.8 kg (131 lb 13.4 oz)   SpO2: 97% 98% 95% 95%   Physical Exam General: lying flat on bed to get HD- resting but with occasional outbursts Heart: RRR Lungs: diminished with poor effort/expansion crackles on right Abdomen: soft Extremities: no sign edema; left heel/foot wrapped; left leg flexed; left arm swollen  in sling Dialysis Access: left thigh HERO graft + bruit  Outpatient HD: (TTS [facility])  4hrs F180 EDW 64.5kg 2K/2.25Ca [place] Heparin- 0 L thigh HeRO AVG  EPO 9200 tiw Hectorol 1ug tiw   Assessment/Plan: 1. Fall with left hip and displaced humerus fx (partial reduction/splint) - nonsurgical candidate; conservative measures;  I foresee issues with pain with mobility due to her these fractures.  She will require hoyer lifting, prolonged sitting in W/C and recliner in order to have outpatient dialysis.  Given these issues and progressive dementia,  Agree with direction things are going, pt now DNR and may be looking at stopping HD given issues.  Pt not able to comprehend what is happening.  2. ESRD - TTS via thigh AVG - in recliner not possible at this point 3. Anemia -  Hgb 10.3 - aranesp not on MAR, will order 4. Secondary hyperparathyroidism - corr Ca ~ 10.5 hectorol 1; binders not needed 5. HTN/volume - CXR NAD on admission; on BB- seems adequate 6. Nutrition - Alb 2.4 renal diet + suppl- family has decided no tube feeds 7. DM - SSI 8. Hx CVA w/ left paresis and left arm  contracture 9. Dementia - baseline 10. Afib - on BB and chronic coumadin- INR at goal 11. Fever- unknown source- no antibiotics yet   Bobbyjoe Pabst A   10/28/2012,7:52 AM  LOS: 3 days        Additional Objective Labs: Basic Metabolic Panel:  Recent Labs Lab 2012/11/24 0200 November 24, 2012 1230 10/26/12 0630  NA 131*  --  139  K 3.8  --  4.3  CL 89*  --  97  CO2 32  --  33*  GLUCOSE 397*  --  202*  BUN 23  --  14  CREATININE 3.51*  --  3.05*  CALCIUM 8.7  --  9.3  PHOS  --  2.7  --    Liver Function Tests:  Recent Labs Lab 11-24-2012 0200  AST 28  ALT 18  ALKPHOS 166*  BILITOT 0.6  PROT 8.3  ALBUMIN 2.4*   Lab Results  Component Value Date   INR 2.86* 10/26/2012   INR 2.81* 2012-11-24   CBC:  Recent Labs Lab 24-Nov-2012 0200  WBC 6.2  NEUTROABS 4.7  HGB 10.3*  HCT 33.2*  MCV 84.5  PLT 207  Cardiac Enzymes:  Recent Labs Lab 24-Nov-2012 0147  TROPONINI <0.30   CBG:  Recent Labs Lab 10/27/12 0645 10/27/12 1043 10/27/12 1628 10/27/12 2145 10/28/12 0503  GLUCAP 142* 146* 169* 173* 212*  Medications:   .  collagenase  1 application Topical Daily  . docusate sodium  100 mg Oral BID  . DULoxetine  60 mg Oral Daily  . feeding supplement (NEPRO CARB STEADY)  237 mL Oral BID BM  . fentaNYL  12.5 mcg Transdermal Q72H  . insulin aspart  0-9 Units Subcutaneous TID WC  . metoprolol tartrate  25 mg Oral Custom  . metoprolol tartrate  25 mg Oral Custom  . multivitamin  1 tablet Oral q morning - 10a  . pantoprazole  40 mg Oral Daily  . sodium chloride  3 mL Intravenous Q12H  . Warfarin - Pharmacist Dosing Inpatient   Does not apply [phone]

## 2012-10-28 NOTE — Progress Notes (Signed)
Dr Isidoro Donning notified of cbg's and actions taken, also discussed antibiotics, blood cultures and IV access. Orders received.

## 2012-10-28 NOTE — Progress Notes (Signed)
ANTICOAGULATION CONSULT NOTE - Follow Up Consult  Pharmacy Consult for Coumadin Indication: atrial fibrillation  Allergies  Allergen Reactions  . Aspirin     Unknown    . Heparin     SHE is HIT neg; platelets have been normal on low dose heparin at out pt dialysis ; not clear why this is listed   . Minoxidil     Unknown    . Penicillins     Unknown      Patient Measurements: Weight: 131 lb 13.4 oz (59.8 kg)  Vital Signs: Temp: 98.3 F (36.8 C) (08/19 1154) Temp src: Oral (08/19 0720) BP: 82/46 mmHg (08/19 1154) Pulse Rate: 100 (08/19 1154)  Labs:  Recent Labs  10/26/12 0630 10/27/12 1025 10/28/12 0430 10/28/12 0759 10/28/12 0800  HGB  --   --   --  10.5*  --   HCT  --   --   --  34.0*  --   PLT  --   --   --  186  --   LABPROT 29.0* 38.5* 40.6*  --   --   INR 2.86* 4.15* 4.45*  --   --   CREATININE 3.05*  --   --   --  6.08*    The CrCl is unknown because both a height and weight (above a minimum accepted value) are required for this calculation.   Medications:  Scheduled:  . collagenase  1 application Topical Daily  . [START ON 10/31/2012] darbepoetin (ARANESP) injection - DIALYSIS  60 mcg Intravenous Q Thu-HD  . docusate sodium  100 mg Oral BID  . DULoxetine  60 mg Oral Daily  . feeding supplement (NEPRO CARB STEADY)  237 mL Oral BID BM  . fentaNYL  12.5 mcg Transdermal Q72H  . insulin aspart  0-9 Units Subcutaneous TID WC  . multivitamin  1 tablet Oral q morning - 10a  . pantoprazole  40 mg Oral Daily  . sodium chloride  500 mL Intravenous Once  . sodium chloride  3 mL Intravenous Q12H  . Warfarin - Pharmacist Dosing Inpatient   Does not apply q1800    Assessment: 79 yo F s/p fall and fracture to L humerus and femur.  Pt was on Coumadin PTA for hx of afib.  INR was therapeutic on admission but has since increased significantly.  No new interacting medication identified.  Suspect INR increase could be related to decreased oral intake (<10% of meals  in the last 24-48 hours).  Last Coumadin dose given 8/17.  Goal of Therapy:  INR 2-3 Monitor platelets by anticoagulation protocol: Yes   Plan:  No Coumadin tonight. Continue daily INR.  [employer], Pharm.D., BCPS Clinical Pharmacist Pager [phone] 10/28/2012 12:50 PM

## 2012-10-28 NOTE — Progress Notes (Addendum)
Patient ID: Ebony [name]  female  ZOX:[id number]    DOB: April 18, 1929    DOA: 10/20/2012  PCP: Johny Sax, MD  Assessment/Plan: Principal Problem: Hypotension - DC metoprolol, will give one fluid bolus - EKG, was tachy 101 in HD at the time of my encounter, but was not on any telemetry - Cannot give beta blocker due to hypotension, - Overnight developed fever, has leukocytosis this morning 14.1, check Blood Cx, UA and Cx, portable CXR, ?SIRS, ?aspiration PNA, will place on broad spectrum antibiotics until full GOC is done with family/son. D/w Dr Phillips Odor (palliative Medicine)  Humerus neck and shaft fracture with displacement  - closed reduction and splinting of left arm with abduction pillow, ortho following  - Post procedure x ray shows partial reduction of shaft fracture. Cont keeping elbow support with abduction pillow.  - Complicated situation, she had a previous graft in that arm which is not used now, has flexion contractures, likelihood of a healing this fracture is small.  Subcapital fracture of femur/left hip  - Recommended for conservation management and NWB per ortho given pt wheelchair bound/bed ridden. -  PT eval, Pain control    Chronic contracture of left wrist  - she has feeble left radial pulse which is likely chronic. She had a left AV graft previously. ED physician discussed with vascular surgery and was recommended no further intervention as hand was well perfused.   Anemia of chronic disease stable. Monitor   Cardiomyopathy  continue metoprolol and statin. euvolemic   DM  A1C of 5.8 discontinued insulin by PCP recently. continue SSI.   End stage renal disease  On HD Tu, th and sat. Renal following  DVT Prophylaxis: On Coumadin  Code Status:   Disposition:    Subjective: Seen during HD, barely arousable, per RN, had received 1 mg of Dilaudid prior to my encounter  Objective: Weight change:   Intake/Output Summary (Last 24 hours) at 10/28/12  1235 Last data filed at 10/28/12 1154  Gross per 24 hour  Intake    220 ml  Output   -544 ml  Net    764 ml   Blood pressure 82/46, pulse 100, temperature 98.3 F (36.8 C), temperature source Oral, resp. rate 16, weight 59.8 kg (131 lb 13.4 oz), SpO2 95.00%.  Physical Exam: General barely arousable Chest: dec BS Abdomen: soft NT, ND, NBS  Extremities: Left upper extremity contracture with abduction pillow   Lab Results: Basic Metabolic Panel:  Recent Labs Lab 10/26/12 0630 10/28/12 0800  NA 139 141  K 4.3 4.9  CL 97 98  CO2 33* 25  GLUCOSE 202* 223*  BUN 14 42*  CREATININE 3.05* 6.08*  CALCIUM 9.3 8.7  PHOS  --  4.2   Liver Function Tests:  Recent Labs Lab 10/20/2012 0200 10/28/12 0800  AST 28  --   ALT 18  --   ALKPHOS 166*  --   BILITOT 0.6  --   PROT 8.3  --   ALBUMIN 2.4* 2.0*   No results found for this basename: LIPASE, AMYLASE,  in the last 168 hours No results found for this basename: AMMONIA,  in the last 168 hours CBC:  Recent Labs Lab 10/26/2012 0200 10/28/12 0759  WBC 6.2 14.1*  NEUTROABS 4.7  --   HGB 10.3* 10.5*  HCT 33.2* 34.0*  MCV 84.5 84.6  PLT 207 186   Cardiac Enzymes:  Recent Labs Lab 10/15/2012 0147  TROPONINI <0.30   BNP: No components found with  this basename: POCBNP,  CBG:  Recent Labs Lab 10/27/12 0645 10/27/12 1043 10/27/12 1628 10/27/12 2145 10/28/12 0503  GLUCAP 142* 146* 169* 173* 212*     Micro Results: Recent Results (from the past 240 hour(s))  SURGICAL PCR SCREEN     Status: None   Collection Time    10/26/12  6:09 AM      Result Value Range Status   MRSA, PCR NEGATIVE  NEGATIVE Final   Staphylococcus aureus NEGATIVE  NEGATIVE Final   Comment:            The Xpert SA Assay (FDA     approved for NASAL specimens     in patients over 77 years of age),     is one component of     a comprehensive surveillance     program.  Test performance has     been validated by [employer] for patients  greater     than or equal to 77 year old.     It is not intended     to diagnose infection nor to     guide or monitor treatment.    Studies/Results: Dg Chest 1 View  11/03/2012   *RADIOLOGY REPORT*  Clinical Data: Fall  CHEST - 1 VIEW  Comparison: Concurrently obtained radiographs of the left shoulder and upper extremity  Findings: Low inspiratory volumes with bibasilar atelectasis. Stable cardiomegaly.  Atherosclerotic calcifications noted in the transverse aorta.  Displaced fracture of the surgical neck of the humerus.  No definite acute rib fracture identified.  Overlapping metallic stents noted in the left upper arm.  The tip of a femoral approach catheter, likely HERO graft projects over the inferior cavoatrial junction.  This fifth  IMPRESSION:  1.  Low inspiratory volumes with bibasilar atelectasis. 2.  Left humeral neck fracture.   Original Report Authenticated By: Malachy Moan, M.D.   Dg Shoulder 1v Left  10/18/2012   *RADIOLOGY REPORT*  Clinical Data: Fall, left arm pain  LEFT SHOULDER - 1 VIEW  Comparison: Concurrently obtained radiographs of the left humerus and chest  Findings: Acute displaced and likely impacted fracture through the surgical neck of the humerus.  Additionally, there is an acute displaced fracture of the mid humeral diaphysis with a full shaft with displacement of the fracture fragments.  Incidental note is made of overlapping stents in the upper arm.  No acute rib fracture identified.  IMPRESSION:  1.  Acute displaced and impacted fracture through the surgical neck of the left humerus. 2.  Acute fracture through the mid diaphysis of the humerus with one full shaft with displacement of the fracture fragments.   Original Report Authenticated By: Malachy Moan, M.D.   Dg Hip Complete Left  10/13/2012   *RADIOLOGY REPORT*  Clinical Data: Fall, left arm and hip pain  LEFT HIP - COMPLETE 2+ VIEW  Comparison: None.  Findings: The bones are diffusely osteopenic.  Acute  impacted subcapital femoral fracture.  The femoral head remains located. Left femoral approach HERO dialysis access.  Multilevel degenerative disc disease in lower lumbar facet arthropathy. Surgical changes of right hip arthroplasty.  Atherosclerotic vascular calcifications.  IMPRESSION:  1.  Acute impacted subcapital femoral fracture on the left. 2.  Surgical changes of prior right hip arthroplasty 3.  Diffuse osteopenia 4.  Left femoral approach HERO dialysis access.   Original Report Authenticated By: Malachy Moan, M.D.   Ct Head Wo Contrast  10/11/2012   *RADIOLOGY REPORT*  Clinical  Data:  Fall, history of prior stroke  CT HEAD WITHOUT CONTRAST CT CERVICAL SPINE WITHOUT CONTRAST  Technique:  Multidetector CT imaging of the head and cervical spine was performed following the standard protocol without intravenous contrast.  Multiplanar CT image reconstructions of the cervical spine were also generated.  Comparison:  Most recent prior head CT 01/17/2010  CT HEAD  Findings: No acute intracranial hemorrhage, acute infarction, mass lesion, mass effect, midline shift or hydrocephalus.  Gray-white differentiation is preserved throughout.  Stable appearance of the left cerebellar and right PCA territory infarct with associated encephalomalacia.  Cortical and cerebellar atrophy is similar compared to prior.  Periventricular white matter hypoattenuation consistent with the sequela of microvascular ischemia is similar to prior.  The globes and orbits are intact.  No focal scalp hematoma or calvarial fracture.  Atherosclerotic calcifications in the bilateral cavernous carotid arteries.  Normal aeration of the mastoid air cells and visualized paranasal sinuses.  IMPRESSION:  1.  No acute intracranial abnormality. 2.  Stable appearance of right PCA and left cerebellar infarcts. 3.  Stable atrophy and microvascular ischemic white matter changes. 4.  Intracranial atherosclerosis  CT CERVICAL SPINE  Findings: No acute  fracture, malalignment or prevertebral soft tissue swelling.  No acute soft tissue abnormality.  Mild atherosclerotic vascular calcifications in the carotid arteries. Multilevel degenerative disc disease most significant at C4-C5 for a posterior calcified disc osteophyte complex results in mild central canal narrowing.  There is exaggerated cervical lordosis with crowding of the spinous processes.  This is favored to be degenerative in etiology.  IMPRESSION:  1.  No acute fracture or malalignment. 2.  C4-C5 posterior disc osteophyte complex results in mild central canal stenosis. 3.  Atherosclerosis.   Original Report Authenticated By: Malachy Moan, M.D.   Ct Cervical Spine Wo Contrast  2012-11-06   *RADIOLOGY REPORT*  Clinical Data:  Fall, history of prior stroke  CT HEAD WITHOUT CONTRAST CT CERVICAL SPINE WITHOUT CONTRAST  Technique:  Multidetector CT imaging of the head and cervical spine was performed following the standard protocol without intravenous contrast.  Multiplanar CT image reconstructions of the cervical spine were also generated.  Comparison:  Most recent prior head CT 01/17/2010  CT HEAD  Findings: No acute intracranial hemorrhage, acute infarction, mass lesion, mass effect, midline shift or hydrocephalus.  Gray-white differentiation is preserved throughout.  Stable appearance of the left cerebellar and right PCA territory infarct with associated encephalomalacia.  Cortical and cerebellar atrophy is similar compared to prior.  Periventricular white matter hypoattenuation consistent with the sequela of microvascular ischemia is similar to prior.  The globes and orbits are intact.  No focal scalp hematoma or calvarial fracture.  Atherosclerotic calcifications in the bilateral cavernous carotid arteries.  Normal aeration of the mastoid air cells and visualized paranasal sinuses.  IMPRESSION:  1.  No acute intracranial abnormality. 2.  Stable appearance of right PCA and left cerebellar infarcts.  3.  Stable atrophy and microvascular ischemic white matter changes. 4.  Intracranial atherosclerosis  CT CERVICAL SPINE  Findings: No acute fracture, malalignment or prevertebral soft tissue swelling.  No acute soft tissue abnormality.  Mild atherosclerotic vascular calcifications in the carotid arteries. Multilevel degenerative disc disease most significant at C4-C5 for a posterior calcified disc osteophyte complex results in mild central canal narrowing.  There is exaggerated cervical lordosis with crowding of the spinous processes.  This is favored to be degenerative in etiology.  IMPRESSION:  1.  No acute fracture or malalignment. 2.  C4-C5 posterior  disc osteophyte complex results in mild central canal stenosis. 3.  Atherosclerosis.   Original Report Authenticated By: Malachy Moan, M.D.   Dg Humerus Left  11/03/2012   *RADIOLOGY REPORT*  Clinical Data: Post reduction views of the left humerus  LEFT HUMERUS - 2+ VIEW  Comparison: 10/28/2012 at 2:35 a.m.  Findings: The oblique transverse, non comminuted fracture of the midshaft of the left humerus has been slightly reduced.  There is still medial displacement of the distal fracture fragment by one full shaft width, but there is no significant residual angulation. The overlap or foreshortening of this fracture measures just under 1 cm.  There is been no change in the displaced proximal humeral metaphyseal fracture which remains foreshortened and overlapped with the humeral head.  Changes from prior vascular surgery and placement of an axillary/brachial stent are stable.  The bones are extensively demineralized.  IMPRESSION: Partial reduction of the mid shaft fracture.  No change in the proximal left humeral metaphyseal fracture.   Original Report Authenticated By: Amie Portland, M.D.   Dg Humerus Left  10/28/2012   *RADIOLOGY REPORT*  Clinical Data: Fall, left arm pain  LEFT HUMERUS - 2+ VIEW  Comparison: Concurrently obtained radiographs of the left  shoulder and chest  Findings: Acute displaced fracture of the surgical neck of the humerus with probable impaction.  Acute displaced fracture through the mid humeral diaphysis.  There is one full shaft with displacement of the fracture fragments with slight apex medial angulation of the fracture site.  Overlapping stents noted in the upper arm, likely within a dialysis access.  IMPRESSION:  1.  Acute displaced and likely impacted fracture of the surgical neck of the humerus. 2.  Acute displaced fracture through the mid aspect of the humeral diaphysis with apex medial angulation of the fracture sites and one full shaft with displacement.   Original Report Authenticated By: Malachy Moan, M.D.    Medications: Scheduled Meds: . collagenase  1 application Topical Daily  . [START ON 10/11/2012] darbepoetin (ARANESP) injection - DIALYSIS  60 mcg Intravenous Q Thu-HD  . docusate sodium  100 mg Oral BID  . DULoxetine  60 mg Oral Daily  . feeding supplement (NEPRO CARB STEADY)  237 mL Oral BID BM  . fentaNYL  12.5 mcg Transdermal Q72H  . insulin aspart  0-9 Units Subcutaneous TID WC  . metoprolol tartrate  25 mg Oral Custom  . multivitamin  1 tablet Oral q morning - 10a  . pantoprazole  40 mg Oral Daily  . sodium chloride  500 mL Intravenous Once  . sodium chloride  3 mL Intravenous Q12H  . Warfarin - Pharmacist Dosing Inpatient   Does not apply q1800      LOS: 3 days   RAI,RIPUDEEP M.D. [facility] 10/28/2012, 12:35 PM Pager: [phone]  If 7PM-7AM, please contact night-coverage [email or web address] Password [email or web address]

## 2012-10-29 DIAGNOSIS — I429 Cardiomyopathy, unspecified: Secondary | ICD-10-CM

## 2012-10-29 DIAGNOSIS — E211 Secondary hyperparathyroidism, not elsewhere classified: Secondary | ICD-10-CM

## 2012-10-29 DIAGNOSIS — K922 Gastrointestinal hemorrhage, unspecified: Secondary | ICD-10-CM

## 2012-10-29 LAB — GLUCOSE, CAPILLARY: Glucose-Capillary: 69 mg/dL — ABNORMAL LOW (ref 70–99)

## 2012-10-29 MED ORDER — MORPHINE SULFATE 2 MG/ML IJ SOLN
INTRAMUSCULAR | Status: AC
  Filled 2012-10-29: qty 1

## 2012-10-29 MED ORDER — LORAZEPAM 2 MG/ML IJ SOLN
INTRAMUSCULAR | Status: AC
  Filled 2012-10-29: qty 1

## 2012-10-29 MED ORDER — DEXTROSE 50 % IV SOLN
INTRAVENOUS | Status: AC
  Filled 2012-10-29: qty 50

## 2012-10-29 MED ORDER — MORPHINE SULFATE 2 MG/ML IJ SOLN
2.0000 mg | Freq: Once | INTRAMUSCULAR | Status: AC
  Administered 2012-10-29: 2 mg via INTRAVENOUS

## 2012-10-29 MED ORDER — MORPHINE SULFATE 2 MG/ML IJ SOLN: 1.0000 mg | INTRAMUSCULAR | Status: DC | PRN

## 2012-10-29 MED ORDER — LORAZEPAM 2 MG/ML IJ SOLN
1.0000 mg | INTRAMUSCULAR | Status: DC | PRN
  Administered 2012-10-29: 1 mg via INTRAVENOUS

## 2012-10-29 MED ORDER — DEXTROSE 50 % IV SOLN
INTRAVENOUS | Status: AC
  Administered 2012-10-29: 50 mL
  Filled 2012-10-29: qty 50

## 2012-10-29 MED ORDER — MORPHINE SULFATE 2 MG/ML IJ SOLN
1.0000 mg | INTRAMUSCULAR | Status: DC | PRN
  Administered 2012-10-29: 2 mg via INTRAVENOUS

## 2012-10-29 MED ORDER — MORPHINE SULFATE 25 MG/ML IV SOLN
1.0000 mg/h | INTRAVENOUS | Status: DC
  Filled 2012-10-29: qty 10

## 2012-11-04 LAB — CULTURE, BLOOD (ROUTINE X 2): Culture: NO GROWTH

## 2012-11-10 NOTE — Discharge Summary (Signed)
Expiration Note/ Death Summary  Ebony [name]  MR#: [id number]  DOB:25-Oct-1929  Date of Admission: 2012/11/01 Date of Death: 11/05/12 at 10:21 AM  Attending Physician:Rodolphe Edmonston  Patient's PCP: Johny Sax, MD  Consults: Treatment Team:  Javier Docker, MD Maree Krabbe, MD/ Dr. Lacy Duverney, nephrology [facility]  Cause of Death: Sepsis with SIRS, end stage- Comfort care  Respiratory failure  Secondary Diagnoses Left humerus neck and shaft fracture with displacement Left hip subcapital fracture of femur  . HYPERPARATHYROIDISM, SECONDARY . ANEMIA OF CHRONIC DISEASE . DEMENTIA, MILD . CARDIOMYOPATHY, SECONDARY . HYPERTENSION, UNSPECIFIED . Atrial fibrillation . End stage renal disease on hemodialysis   Brief H and P: For complete details please refer to admission H and P, but in brief 79 y/o female with CAD, ESRD on HD (Tu, th and sat), HTN, mild dementia, Afib on coumadin, hx of stroke with left sided paralysis, wheelchair bound , DM, who was sent [facility] after a fall from bed. She denied any headache, dizziness, blurred vision, chest pain, SOB, abdominal pain, nausea , vomiting or diarrhea. Patient landed on her left side and was unable to move her hip. She was brought to the ED.X ray of the left humerus showed displaced fracture of surgical neck of left humerus and acute displaced fracture of mid diaphysis. Xray of the hip showed non displaced capital fracture of left femur. CT of the head and cervical spine were unremarkable for any injury. She ws given a dose fo IV fentanyl and seen by Dr Shelle Iron who performed a closed reduction of the left mid shaft humerus. recommended keeping elbow support with abduction pillow. Given her non weight bearing status and underlying co morbidities recommend, no surgical intervention was recommended for her hip fracture.    Hospital Course:  Patient tolerated the lower female with multiple chronic end-stage  medical problems including ESRD on hemodialysis, failure to thrive, bedridden status, generalized debility, CVA in 2010 presented with acute left humerus and left hip fractures. She had very poor functional status at baseline. Orthopedics was consulted and given the complicated situation with each fractures they were deemed as nonoperative. She subsequently developed a delirium and had no significant signs of improvement. Patient was unable to have hemodialysis in a recliner and goals of care discussion was requested by the renal service.  Left Humerus neck and shaft fracture with displacement - difficult situation/non-operative. Closed reduction was done by Dr Shelle Iron on 11-02-2022 with splinting of left arm with abduction pillow. Post procedure x ray showed partial reduction of shaft fracture. Complicated situation as she had a previous graft in that arm which was not used now, flexion contractures,  poor pre-admission functional status (wheelchair/bedridden); per ortho, likelihood of a healing this fracture was minimal.  Left Subcapital fracture of femur/left hip - ortho recommended  for conservation management and NWB given pt was wheelchair bound/bed ridden.   Hypotension likely SIRS/sepsis- end stage: Over the last 2 days prior to death, patient developed fever, leukocytosis. Blood cultures were done, results still pending, patient was anuric, chest x-ray was negative for pneumonia however still concern for sepsis with aspiration pneumonia. Palliative consultation was obtained. Goals of care discussions were held with patient's son, Mr Arnetha Gula, he subsequently requested for comfort care goals. Patient was placed on IV morphine as needed and Ativan, she passed on November 05, 2012 at 10:21 AM.   Chronic contracture of left wrist - she had feeble left radial pulse , possibly chronic. She had a left AV graft  previously. ED physician discussed with vascular surgery and was recommended no further intervention as hand was  well perfused.   DM A1C of 5.8 discontinued insulin by PCP recently.   End stage renal disease  On HD Tu, th and sat patient was unable to have hemodialysis in a recliner, issues with pain with the mobility due to the fractures, progressive dementia, Renal service had requested for palliative medicine consult.   SignedThad Ranger M.D. [facility] 11-22-12, 10:40 AM Pager: [phone]

## 2012-11-10 NOTE — Progress Notes (Signed)
Post-mortem care preformed by Laren Boom, RN, Deneise Lever, RN, and Brett Fairy, RN. Patient transported to the morgue by Laren Boom and Brett Fairy, RN at [phone]

## 2012-11-10 NOTE — Progress Notes (Signed)
Patient ID: Ebony [name]  female  ZOX:[id number]    DOB: 02-26-1930    DOA: 10/23/2012  PCP: Johny Sax, MD  Assessment/Plan: Principal Problem: Hypotension likely SIRS/sepsis- end stage - She had developed fever, leukocytosis this morning 14.1, blood cx done, anuric, CXR was neg for PNA, still conscern for sepsis/ aspiration PNA, was placed n broad spectrum antibiotics. Overnight, remained hypoxic, was placed on 6L, hypotensive this AM, with hypoglycemia, CBG 10 (given 2amps D50, was on D5 drip) likely from sepsis, shallow agonal breathing. Son at the bedside, d/w Mr Arnetha Gula in detail, he requested full comfort care at this point. He stated that 'he knew this was coming", kissed his mother's forehead and said "its okay to go". Provided moral support to son, called Dr Phillips Odor (palliative medicine) to update, will call chaplain.     - placed on PRN morphine and ativan   Humerus neck and shaft fracture with displacement - difficult situation/non-operative. Closed reduction was done and splinting of left arm with abduction pillow. Post procedure x ray showed partial reduction of shaft fracture. Complicated situation, she had a previous graft in that arm which is not used now, has flexion contractures, per ortho, likelihood of a healing this fracture is small and poor pre-admission functional status (wheelchair/bedridden).  Subcapital fracture of femur/left hip - Recommended for conservation management and NWB per ortho given pt wheelchair bound/bed ridden.  Chronic contracture of left wrist - she has feeble left radial pulse which is likely chronic. She had a left AV graft previously. ED physician discussed with vascular surgery and was recommended no further intervention as hand was well perfused.   DM  A1C of 5.8 discontinued insulin by PCP recently.   End stage renal disease  On HD Tu, th and sat.   DVT Prophylaxis: On Coumadin  Code Status:   Disposition: d/w patient's son at  bed-side, now complete comfort care, No labs, fluids or antibiotics.     Subjective: Unresponsive, BP 44/19, CBG 10, agonal breathing, son at bedside.    Objective: Weight change:   Intake/Output Summary (Last 24 hours) at 11/02/2012 0808 Last data filed at 11/02/2012 0745  Gross per 24 hour  Intake    950 ml  Output   -543 ml  Net   1493 ml   Blood pressure 44/19, pulse 102, temperature 98.4 F (36.9 C), temperature source Axillary, resp. rate 25, weight 60.4 kg (133 lb 2.5 oz), SpO2 94.00%.  Physical Exam: General: unresponsive Chest : b/l rhonchi Abdomen: soft Extremities: Left upper extremity contracture with abduction pillow   Lab Results: Basic Metabolic Panel:  Recent Labs Lab 10/26/12 0630 10/28/12 0800  NA 139 141  K 4.3 4.9  CL 97 98  CO2 33* 25  GLUCOSE 202* 223*  BUN 14 42*  CREATININE 3.05* 6.08*  CALCIUM 9.3 8.7  PHOS  --  4.2   Liver Function Tests:  Recent Labs Lab 11/01/2012 0200 10/28/12 0800  AST 28  --   ALT 18  --   ALKPHOS 166*  --   BILITOT 0.6  --   PROT 8.3  --   ALBUMIN 2.4* 2.0*   No results found for this basename: LIPASE, AMYLASE,  in the last 168 hours No results found for this basename: AMMONIA,  in the last 168 hours CBC:  Recent Labs Lab 10/21/2012 0200 10/28/12 0759  WBC 6.2 14.1*  NEUTROABS 4.7  --   HGB 10.3* 10.5*  HCT 33.2* 34.0*  MCV 84.5 84.6  PLT 207 186   Cardiac Enzymes:  Recent Labs Lab 10/17/2012 0147  TROPONINI <0.30   BNP: No components found with this basename: POCBNP,  CBG:  Recent Labs Lab 10/28/12 2055 10/28/12 2147 11/13/12 0002 11-13-2012 0058 11-13-12 0353  GLUCAP 51* 87 69* 113* 105*     Micro Results: Recent Results (from the past 240 hour(s))  SURGICAL PCR SCREEN     Status: None   Collection Time    10/26/12  6:09 AM      Result Value Range Status   MRSA, PCR NEGATIVE  NEGATIVE Final   Staphylococcus aureus NEGATIVE  NEGATIVE Final   Comment:            The Xpert SA  Assay (FDA     approved for NASAL specimens     in patients over 77 years of age),     is one component of     a comprehensive surveillance     program.  Test performance has     been validated by [employer] for patients greater     than or equal to 77 year old.     It is not intended     to diagnose infection nor to     guide or monitor treatment.    Studies/Results: Dg Chest 1 View  11/04/2012   *RADIOLOGY REPORT*  Clinical Data: Fall  CHEST - 1 VIEW  Comparison: Concurrently obtained radiographs of the left shoulder and upper extremity  Findings: Low inspiratory volumes with bibasilar atelectasis. Stable cardiomegaly.  Atherosclerotic calcifications noted in the transverse aorta.  Displaced fracture of the surgical neck of the humerus.  No definite acute rib fracture identified.  Overlapping metallic stents noted in the left upper arm.  The tip of a femoral approach catheter, likely HERO graft projects over the inferior cavoatrial junction.  This fifth  IMPRESSION:  1.  Low inspiratory volumes with bibasilar atelectasis. 2.  Left humeral neck fracture.   Original Report Authenticated By: Malachy Moan, M.D.   Dg Shoulder 1v Left  11/03/2012   *RADIOLOGY REPORT*  Clinical Data: Fall, left arm pain  LEFT SHOULDER - 1 VIEW  Comparison: Concurrently obtained radiographs of the left humerus and chest  Findings: Acute displaced and likely impacted fracture through the surgical neck of the humerus.  Additionally, there is an acute displaced fracture of the mid humeral diaphysis with a full shaft with displacement of the fracture fragments.  Incidental note is made of overlapping stents in the upper arm.  No acute rib fracture identified.  IMPRESSION:  1.  Acute displaced and impacted fracture through the surgical neck of the left humerus. 2.  Acute fracture through the mid diaphysis of the humerus with one full shaft with displacement of the fracture fragments.   Original Report Authenticated  By: Malachy Moan, M.D.   Dg Hip Complete Left  10/20/2012   *RADIOLOGY REPORT*  Clinical Data: Fall, left arm and hip pain  LEFT HIP - COMPLETE 2+ VIEW  Comparison: None.  Findings: The bones are diffusely osteopenic.  Acute impacted subcapital femoral fracture.  The femoral head remains located. Left femoral approach HERO dialysis access.  Multilevel degenerative disc disease in lower lumbar facet arthropathy. Surgical changes of right hip arthroplasty.  Atherosclerotic vascular calcifications.  IMPRESSION:  1.  Acute impacted subcapital femoral fracture on the left. 2.  Surgical changes of prior right hip arthroplasty 3.  Diffuse osteopenia 4.  Left femoral approach HERO dialysis access.  Original Report Authenticated By: Malachy Moan, M.D.   Ct Head Wo Contrast  November 08, 2012   *RADIOLOGY REPORT*  Clinical Data:  Fall, history of prior stroke  CT HEAD WITHOUT CONTRAST CT CERVICAL SPINE WITHOUT CONTRAST  Technique:  Multidetector CT imaging of the head and cervical spine was performed following the standard protocol without intravenous contrast.  Multiplanar CT image reconstructions of the cervical spine were also generated.  Comparison:  Most recent prior head CT 01/17/2010  CT HEAD  Findings: No acute intracranial hemorrhage, acute infarction, mass lesion, mass effect, midline shift or hydrocephalus.  Gray-white differentiation is preserved throughout.  Stable appearance of the left cerebellar and right PCA territory infarct with associated encephalomalacia.  Cortical and cerebellar atrophy is similar compared to prior.  Periventricular white matter hypoattenuation consistent with the sequela of microvascular ischemia is similar to prior.  The globes and orbits are intact.  No focal scalp hematoma or calvarial fracture.  Atherosclerotic calcifications in the bilateral cavernous carotid arteries.  Normal aeration of the mastoid air cells and visualized paranasal sinuses.  IMPRESSION:  1.  No acute  intracranial abnormality. 2.  Stable appearance of right PCA and left cerebellar infarcts. 3.  Stable atrophy and microvascular ischemic white matter changes. 4.  Intracranial atherosclerosis  CT CERVICAL SPINE  Findings: No acute fracture, malalignment or prevertebral soft tissue swelling.  No acute soft tissue abnormality.  Mild atherosclerotic vascular calcifications in the carotid arteries. Multilevel degenerative disc disease most significant at C4-C5 for a posterior calcified disc osteophyte complex results in mild central canal narrowing.  There is exaggerated cervical lordosis with crowding of the spinous processes.  This is favored to be degenerative in etiology.  IMPRESSION:  1.  No acute fracture or malalignment. 2.  C4-C5 posterior disc osteophyte complex results in mild central canal stenosis. 3.  Atherosclerosis.   Original Report Authenticated By: Malachy Moan, M.D.   Ct Cervical Spine Wo Contrast  11/08/12   *RADIOLOGY REPORT*  Clinical Data:  Fall, history of prior stroke  CT HEAD WITHOUT CONTRAST CT CERVICAL SPINE WITHOUT CONTRAST  Technique:  Multidetector CT imaging of the head and cervical spine was performed following the standard protocol without intravenous contrast.  Multiplanar CT image reconstructions of the cervical spine were also generated.  Comparison:  Most recent prior head CT 01/17/2010  CT HEAD  Findings: No acute intracranial hemorrhage, acute infarction, mass lesion, mass effect, midline shift or hydrocephalus.  Gray-white differentiation is preserved throughout.  Stable appearance of the left cerebellar and right PCA territory infarct with associated encephalomalacia.  Cortical and cerebellar atrophy is similar compared to prior.  Periventricular white matter hypoattenuation consistent with the sequela of microvascular ischemia is similar to prior.  The globes and orbits are intact.  No focal scalp hematoma or calvarial fracture.  Atherosclerotic calcifications in the  bilateral cavernous carotid arteries.  Normal aeration of the mastoid air cells and visualized paranasal sinuses.  IMPRESSION:  1.  No acute intracranial abnormality. 2.  Stable appearance of right PCA and left cerebellar infarcts. 3.  Stable atrophy and microvascular ischemic white matter changes. 4.  Intracranial atherosclerosis  CT CERVICAL SPINE  Findings: No acute fracture, malalignment or prevertebral soft tissue swelling.  No acute soft tissue abnormality.  Mild atherosclerotic vascular calcifications in the carotid arteries. Multilevel degenerative disc disease most significant at C4-C5 for a posterior calcified disc osteophyte complex results in mild central canal narrowing.  There is exaggerated cervical lordosis with crowding of the spinous processes.  This  is favored to be degenerative in etiology.  IMPRESSION:  1.  No acute fracture or malalignment. 2.  C4-C5 posterior disc osteophyte complex results in mild central canal stenosis. 3.  Atherosclerosis.   Original Report Authenticated By: Malachy Moan, M.D.   Dg Humerus Left  10/23/2012   *RADIOLOGY REPORT*  Clinical Data: Post reduction views of the left humerus  LEFT HUMERUS - 2+ VIEW  Comparison: 10/28/2012 at 2:35 a.m.  Findings: The oblique transverse, non comminuted fracture of the midshaft of the left humerus has been slightly reduced.  There is still medial displacement of the distal fracture fragment by one full shaft width, but there is no significant residual angulation. The overlap or foreshortening of this fracture measures just under 1 cm.  There is been no change in the displaced proximal humeral metaphyseal fracture which remains foreshortened and overlapped with the humeral head.  Changes from prior vascular surgery and placement of an axillary/brachial stent are stable.  The bones are extensively demineralized.  IMPRESSION: Partial reduction of the mid shaft fracture.  No change in the proximal left humeral metaphyseal fracture.    Original Report Authenticated By: Amie Portland, M.D.   Dg Humerus Left  10/22/2012   *RADIOLOGY REPORT*  Clinical Data: Fall, left arm pain  LEFT HUMERUS - 2+ VIEW  Comparison: Concurrently obtained radiographs of the left shoulder and chest  Findings: Acute displaced fracture of the surgical neck of the humerus with probable impaction.  Acute displaced fracture through the mid humeral diaphysis.  There is one full shaft with displacement of the fracture fragments with slight apex medial angulation of the fracture site.  Overlapping stents noted in the upper arm, likely within a dialysis access.  IMPRESSION:  1.  Acute displaced and likely impacted fracture of the surgical neck of the humerus. 2.  Acute displaced fracture through the mid aspect of the humeral diaphysis with apex medial angulation of the fracture sites and one full shaft with displacement.   Original Report Authenticated By: Malachy Moan, M.D.    Medications: Scheduled Meds: . ceFEPime (MAXIPIME) IV  2 g Intravenous Q T,Th,Sa-HD  . collagenase  1 application Topical Daily  . [START ON 11-23-2012] darbepoetin (ARANESP) injection - DIALYSIS  60 mcg Intravenous Q Thu-HD  . dextrose      . fentaNYL  12.5 mcg Transdermal Q72H  . LORazepam      . morphine      . sodium chloride  3 mL Intravenous Q12H  . [START ON 23-Nov-2012] vancomycin  500 mg Intravenous Q T,Th,Sa-HD      LOS: 4 days   Winifred Balogh M.D. [facility] 11/03/2012, 8:08 AM Pager: [phone]  If 7PM-7AM, please contact night-coverage [email or web address] Password [email or web address]

## 2012-11-10 NOTE — Progress Notes (Signed)
CBG 10 taken at 0750. BP 44/19. 2 amps of D50 were administered at 0755 and 0800. Pts CBG 140 taken again at 0815. Dr. Isidoro Donning was paged. Additional orders were given to administer 2mg  of morphine and 1mg  of ativan. Comfort care measures were ordered for patient all other medications were discontinued.

## 2012-11-10 NOTE — Progress Notes (Signed)
Chaplain Note: Chaplain visited with pt and pt's family.  Pt was in bed, receiving comfort care measures.  Pt's son was at bedside.  Chaplain provided spiritual comfort and support for pt and pt's family.  Pt's son is a person of faith and is at peace, though in grief, with his mother's impending death.  Chaplain provided listening and support as pt expressed his grief.  Pt's son expressed appreciation for chaplain support.  Chaplain will follow up as needed.  11/05/2012 0900  Clinical Encounter Type  Visited With Patient and family together  Visit Type Spiritual support;Patient actively dying  Referral From Physician  Spiritual Encounters  Spiritual Needs Emotional;Grief support  Stress Factors  Family Stress Factors Loss  Verdie Shire, Chaplain [phone]

## 2012-11-10 NOTE — Progress Notes (Signed)
Pt passed away peacefully minutes ago.  Her dialysis center has been notified.  Bard Herbert, PA-C

## 2012-11-10 NOTE — Progress Notes (Signed)
Follow-up patient seen at bedside grunting respiration RR=28 audible upper airway secretions; son Ebony [name] at bedside, he feels patient is not comfortable at this time and stated his first goal is for her to be comfortable; patient received 2 mg Morphine approximately 8 am; spoke with staff RN Chari Manning and Dr Isidoro Donning; per Dr Isidoro Donning an order for additional one time dose of Morphine 2 mg and will initiate Morphine drip for comfort; spoke with son Ebony [name] at bedside who is aware that his mother may pass within hours and he stated "she waited for me to get here and now it is up to her and the [place]"; Ebony [name] has spoken with his aunt and has tried to reach his two brothers to inform them of patient's continued decline; he stated he is at peace with where things are; emotional support offered and hospital Chaplain arrived to speak with Ebony [name] and offer prayer/presence.  PMT available as needed   Valente David, RN 10/28/2012, 9:16 AM Palliative Medicine Team RN Liaison [phone]

## 2012-11-10 NOTE — Progress Notes (Signed)
Pt found in bed, absent breath sounds and heart sounds were assessed for a full minute. Verified by 2 RNs Brett Fairy, RN and Aura Camps. Patient is a NCB/DNR. Palliative care consulted yesterday and comfort care measures were initiated this morning once Dr. Isidoro Donning spoke with son. Family was not present at time of death but was called moments before and notified that patients respiratory status was declining. Patient pronounced dead at 1021. Staff stayed with patient the entire time. [facility] contacted at 1025 by Laren Boom, RN.

## 2012-11-10 DEATH — deceased

## 2014-02-18 ENCOUNTER — Encounter (HOSPITAL_COMMUNITY): Payer: Self-pay | Admitting: Vascular Surgery

## 2014-03-25 ENCOUNTER — Encounter (HOSPITAL_COMMUNITY): Payer: Self-pay | Admitting: Vascular Surgery

## 2014-06-29 NOTE — Consult Note (Signed)
PATIENT NAME:  Ebony [name], Ebony [name] MR#:  [id number] DATE OF BIRTH:  24-Feb-1930  DATE OF CONSULTATION:  02/29/2012  REFERRING PHYSICIAN:  Festus BarrenJason Dew, MD  CONSULTING PHYSICIAN:  Brexton Sofia Lizabeth LeydenN. Darrielle Pflieger, MD  REASON FOR CONSULTATION: Evaluation and management of type of patient with end-stage renal disease on hemodialysis.  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 The patient has a number of failed prior access. She had a left thigh immediate stick HeRO graft placed yesterday. Dr. Wyn Quakerew has requested that we attempt dialysis using this graft to ensure that it is functional. In regards to her end-stage renal disease, the patient goes to the [facility] in [place], [place]. She is followed by the [facility]. She is unable to tell me how long she has been on dialysis. In terms of her estimated dry weight, this is reported as 66.5 kg from her dialysis unit. Her runtime is 4 hours using an Optiflux 180 dialyzer. She also has anemia of chronic kidney disease. Her current hemoglobin is 9.0. She receives Epogen 3,000 units IV three times a week during dialysis. She also has secondary hyperparathyroidism and she is treated with Hectorol for this. She reports that she tolerates dialysis relatively well. There does appear to be a thrill in her access and a bruit is also auscultated.   PAST MEDICAL HISTORY:  1. End-stage renal disease on hemodialysis Tuesday, Thursday, and Saturday at [facility] in [place], [place]  followed by [facility].  2. Hypertension.  3. History of diabetes mellitus.  4. CVA with left upper extremity contracture.  5. Secondary hyperparathyroidism.  6. Anemia of chronic kidney disease. 7. Failed bilateral upper extremity and right lower extremity grafts.  8. Hyperlipidemia.   CURRENT INPATIENT MEDICATIONS: Include: 1. Normal saline at 30 mL an hour,  2. Tylox 1 to 2 tablets p.o. q.4 hours p.r.n. pain.  3. Amlodipine 5 mg daily.  4. Cymbalta 20 mg p.o. daily.  5. Sliding scale insulin.  6. Metoprolol 25 mg p.o. b.i.d.  7. Morphine 2 to 4 mg IV q.2 hours p.r.n. pain.  8. Omeprazole 20 mg p.o. b.i.d.  9. Zocor 20 mg p.o. at bedtime.  10. Warfarin 4 mg p.o. q.5:00 p.m.   ALLERGIES: To ASPIRIN, HEPARIN, MINOXIDIL AND PENICILLINS.  SOCIAL HISTORY: The [facility] in [facility] [place]. She denies tobacco, alcohol or illicit drug use.  FAMILY HISTORY: Mother deceased and had history of coronary artery disease. The patient is unclear how her father died. She had a brother who was on dialysis but is deceased.  REVIEW OF SYSTEMS: CONSTITUTIONAL: Denies fevers, chills or weight loss.  EYES: Denies diplopia but has diminished vision. HENT: Denies hearing loss. Denies epistaxis or sore throat. CARDIOVASCULAR: Currently denies chest pain, palpitations, PND.  RESPIRATORY: Denies cough, shortness of breath, or hemoptysis.  GASTROINTESTINAL: Denies nausea, vomiting, diarrhea.  GENITOURINARY: Denies frequency or urgency. Does have history of end-stage renal disease.  MUSCULOSKELETAL: Has a contracture that is chronic in the left upper extremity.  INTEGUMENTARY: Denies skin rashes or lesions.  NEUROLOGIC: Has had prior CVA. PSYCHIATRIC: Denies anxiety or history of schizophrenia.  ENDOCRINE: Denies polyuria, polydipsia, or polyphagia.  HEMATOLOGIC/LYMPHATIC: Has history of anemia of chronic kidney disease. ALLERGY/IMMUNOLOGIC: Denies seasonal allergies or  immunodeficiency.  PHYSICAL EXAMINATION: VITAL SIGNS: Temperature 98.3, pulse 70, respirations 18, blood pressure 99/64, pulse ox 96%.  GENERAL: Well-developed, chronically ill-appearing African American female currently in no acute distress.  HEENT: Normocephalic, atraumatic. Extraocular movements are intact. Pupils equal, round, and reactive to light. Bilateral arcus senilis noted. Conjunctivae are pale. No epistaxis noted. Gross hearing intact. Oral mucosa moist.  NECK: Supple and without JVD or lymphadenopathy.  LUNGS: Clear to auscultation bilaterally with normal respiratory effort.  HEART: S1, S2. Regular rate and rhythm. No murmurs or rubs appreciated.  ABDOMEN: Soft, nontender, nondistended. Bowel sounds positive. No rebound or guarding. No gross organomegaly appreciated.  EXTREMITIES: Trace lower extremity edema noted in both lower extremities.  NEUROLOGIC: The patient is awake, alert, and oriented to time, person, and place. The patient has a chronic contracture in her left upper extremity. She has good strength in her right upper extremity preserved at 5/5.  MUSCULOSKELETAL: As above, has contracture in the left upper extremity.  SKIN: Warm and dry. No rashes noted.  PSYCHIATRIC: The patient with an appropriate affect, however overall has limited insight into her illness.  HEMODIALYSIS ACCESS: The patient has scars in both upper extremities indicating prior failed accesses. She has a new hemodialysis access in her left thigh. There is a palpable thrill and audible bruit overlying the access.   LABORATORY DATA: Sodium 142, potassium 3.7, chloride 105, CO2 of 27, BUN 36, creatinine 7.57. CBC shows WBCs 5, hemoglobin 9, hematocrit 27, platelets 159. INR is 1.0. Blood glucose 113.  IMPRESSION: This is an 81 year old African American female with past medical history of end-stage renal disease on hemodialysis Tuesday, Thursday, and Saturday at [facility] in [place] [facility], hypertension, diabetes mellitus, cerebrovascular accident with left upper extremity contracture, secondary hyperparathyroidism, anemia of chronic kidney disease, generalized debility, hyperlipidemia who presented to [facility] for placement of left thigh graft.   PLAN: 1. End-stage renal disease on hemodialysis Tuesday, Thursday, Saturday followed at [facility] followed by [facility]. Dr. Wyn Quaker has requested that we dialyze the patient to ensure that her immediate stick graft in her left thigh works. In regards to her dialysis, her estimated dry weight was reported to [place] as being 66.5 kg. The weight here we have noted at 75.3 kg. It is unclear as to whether the most recent weight is accurate. We will have the patient reweighed. For now, we will plan to dialyze the patient and our goal ultrafiltration will be 2.5 kg. We will not use any heparin today.  2. Anemia of chronic kidney disease. Hemoglobin low at 9.0. The patient was on any Epogen 3,000 units 3 times a week. For now, we will administer the patient Epogen 10,000 units IV x 1 during dialysis.  3. Secondary hyperparathyroidism. We plan to follow up phosphorus today. We will restart the patient on PhosLo one tablet p.o. t.i.d. as she was on this at the nursing home.   I would like to thank Dr. Wyn Quaker for this kind referral. Further plan as the patient progresses.    ____________________________ Lennox Pippins, MD mnl:es D: 02/29/2012 09:48:22 ET T: 02/29/2012 10:19:03 ET JOB#: [id number]  cc: Lennox Pippins, MD, <Dictator> Lennox Pippins MD ELECTRONICALLY SIGNED 03/05/2012 13:54

## 2014-06-29 NOTE — Op Note (Signed)
PATIENT NAME:  Ebony HoitCOLQUIT, Ebony [name] MR#:  [id number] DATE OF BIRTH:  1929-03-17  DATE OF PROCEDURE:  02/27/2012  PREOPERATIVE DIAGNOSES:  1.  End-stage renal disease with multiple previous failed dialysis access and limited dialysis access options at this point.  2.  Hyperlipidemia.  3.  Diabetes.  4.  Stroke.  5.  Coronary disease.   POSTOPERATIVE DIAGNOSES:  1.  End-stage renal disease with multiple previous failed dialysis access and limited dialysis access options at this point.  2.  Hyperlipidemia.  3.  Diabetes.  4.  Stroke.  5.  Coronary disease.   PROCEDURE: 1.  Removal of the left femoral PermCath.  2.  Catheter placement into inferior vena cava from left femoral venous approach.  3.  Left femoral iliac and inferior vena cava venogram.  4.  Percutaneous transluminal angioplasty of left external iliac vein with 10 and 12 mm diameter angioplasty balloon.   SURGEON:  Annice NeedyJason S. Dew, MD   ANESTHESIA:  Local with moderate conscious sedation.   ESTIMATED BLOOD LOSS:  25 mL.   INDICATION FOR PROCEDURE:  An 81 year old African American female with end-stage renal disease. She has exhausted her upper extremity options. She has remained catheter dependent in the left groin after a left thigh graft failed due to a venous occlusion. We are hoping to get a left thigh graft in her, but she had a venous stenosis around her catheter and a venogram was performed for further evaluation and possible treatment of this. Risks and benefits were discussed. Informed consent was obtained.   DESCRIPTION OF PROCEDURE:  The patient is brought to the interventional radiology suite. The left lower extremity was sterilely prepped and draped, a sterile surgical field was created. The existing catheter was rewired with an Amplatz Super Stiff wire and removed in the usual fashion without difficulty. Over the wire, a 12-French sheath was then placed. Imaging showed a very high-grade stenosis in the left external  iliac artery that was tapered over several centimeters as results of chronic catheter dependence likely. The common iliac vein and inferior vena cava were patent. With the wire and sheath in place, I performed percutaneous transluminal angioplasty from the left common iliac vein down to the left common femoral vein near where the sheath entered the vein with 10 and then 12 mm diameter angioplasty balloon. _____ were taken which did improve with angioplasty. Completion imaging showed significantly improved flow. There was some chronic appearing thrombus in left common femoral and distal external iliac vein, but flow was markedly improved. At this point, I elected to terminate the procedure and we will plan on proceeding with her surgical therapy tomorrow.     ____________________________ Annice NeedyJason S. Dew, MD jsd:es D: 02/27/2012 13:37:57 ET T: 02/27/2012 14:39:41 ET JOB#: [id number]  cc: Annice NeedyJason S. Dew, MD, <Dictator> Annice NeedyJASON S DEW MD ELECTRONICALLY SIGNED 02/29/2012 18:35

## 2014-06-29 NOTE — Op Note (Signed)
PATIENT NAME:  Ebony [name], Ebony [name] MR#:  [id number] DATE OF BIRTH:  Jul 29, 1929  DATE OF PROCEDURE:  02/28/2012  PREOPERATIVE DIAGNOSES: 1. End-stage renal disease with very difficult dialysis access options.  2. Left iliac stenosis.  3. Previously failed bilateral upper extremity and right lower extremity grafts due to venous occlusions.  4. Stroke.  5. Hypertension.   POSTOPERATIVE DIAGNOSES:  1. End-stage renal disease with very difficult dialysis access options.  2. Left iliac stenosis.  3. Previously failed bilateral upper extremity and right lower extremity grafts due to venous occlusions.  4. Stroke.  5. Hypertension.   PROCEDURE: Left thigh HeRO graft using femoral artery as in flow and common femoral vein as venous access with graft-to-graft anastomosis to allow for an immediate stick AV graft.   SURGEON: Annice Needy, M.D.   ANESTHESIA: General.   ESTIMATED BLOOD LOSS: Approximately 50 mL.   INDICATION FOR PROCEDURE: This is an 81 year old African American female with end-stage renal disease. She has had this for many years and has very limited dialysis access options. She has been catheter-dependent through the left groin for some time as this was her only patent vein for potential access. We are asked to evaluate her for possibilities for permanent dialysis access. The risks and benefits were discussed and a left thigh access was planned. She had a preliminary intervention to her left iliac vein yesterday and she is brought back today for her surgical access placement.   DESCRIPTION OF PROCEDURE: The patient was brought to the operative suite and, after an adequate level of general endotracheal anesthesia was obtained, the left lower extremity was sterilely prepped and draped and a sterile surgical field was created. An oblique incision was created, at the inguinal crease. The dissection at this location was quite tedious due to her previous graft and significant scar tissue in  the area. The common femoral artery and its bifurcation were dissected out and encircled with vessel loops. The vein dissection was also quite deep, tedious. The vein was quite diseased, in the common femoral location, with evidence of chronic thrombus present. For this reason, I did not feel that a standard loop thigh AV graft was a good idea. We had considered placing a hybrid graft with a Viabahn extension on the end of the graft; however, she had a heparin allergy and this was not available to [place]. She needed an immediate stick PTFE graft and, in evaluating all our options with our procedure, I elected to perform a HeRO graft and do a graft-to-graft anastomosis to make the PTFE portion an immediate stick use. I tunneled a Flixene immediate stick graft in the thigh with the most curved tunneler and a small counter incision in the mid thigh. I then created a graft-to-graft anastomosis with CV-6 suture using the Flixene graft and the very distal portion of the PTFE portion of the HeRO graft just before the grommet. After this was done, the patient was given a dose of Angiomax for systemic anticoagulation. Needle access was obtained to the femoral vein and I was able to get a wire up. Imaging was done and showed that there was patency, but significant irregularity within the iliac vein after intervention yesterday. I selected to pass a HeRO graft through this and this was parked into the inferior vena cava. It was then connected to the PTFE portion with the typical grommet. I then performed an arterial anastomosis with the CV-6 suture to the femoral artery using again the immediate stick Flixene graft.  The graft was flushed and de-aired prior to release of control. On release there was good flow through the graft. There was some oozing around the silicone portion of the HeRO into the vein and 5-0 Prolene sutures were used to cinch the vein around the silicone portion of the HeRO graft. This allowed us to gain  hemostasis. The wound was then irrigated. Surgicel and Evicyl topical hemostatic agents were placed. The wound was then closed with interrupted 3-0 Vicryl, running 3-0 Vicryl and 4-0 Monocryl, and the counterincision was closed with 3-0 Vicryl and 4-0 Monocryl. Dermabond was placed as a dressing. The patient tolerated the procedure well and was taken to the recovery room in stable condition. ____________________________ Annice NeedyJason S. Dew, MD jsd:sb D: 02/28/2012 16:17:23 ET    T: 02/29/2012 08:03:17 ET        JOB#: [id number] cc: Annice NeedyJason S. Dew, MD, <Dictator> Llana AlimentJames L. Deterding, MD Annice NeedyJASON S DEW MD ELECTRONICALLY SIGNED 02/29/2012 18:37

## 2014-07-02 NOTE — Op Note (Signed)
PATIENT NAME:  Ebony HoitCOLQUIT, Ebony [name] MR#:  [id number] DATE OF BIRTH:  11-12-1929  DATE OF PROCEDURE:  07/02/2012  PREOPERATIVE DIAGNOSES: 1.  End-stage renal disease.  2.  Poorly functioning left thigh arteriovenous graft with difficult access.  3.  Multiple previous failed accesses.  4.  HEPARIN ALLERGY.  5.  Stroke.  6.  Diabetes.   POSTOPERATIVE DIAGNOSES: 1.  End-stage renal disease.  2.  Poorly functioning left thigh arteriovenous graft with difficult access.  3.  Multiple previous failed accesses.  4.  HEPARIN ALLERGY.  5.  Stroke.  6.  Diabetes.  PROCEDURE PERFORMED: 1.  Ultrasound guidance for vascular access to left thigh hero graft.  2.  Left thigh shuntogram and central venogram.   SURGEON:  Marlow BaarsJason S Kyrsten Deleeuw, M.D.   ANESTHESIA:  Local.   ESTIMATED BLOOD LOSS:  25 mL   INDICATION FOR PROCEDURE:  Female who we put a left thigh immediate stick HeRO graft in several months ago for dialysis. It has been working reasonably well but recently they have had difficulties with access. We were asked to evaluate the graft. Risks and benefits are discussed and informed consent is obtained.   DESCRIPTION OF PROCEDURE: The patient is brought to the vascular interventional radiology suite. Left lower extremity sterilely prepped and draped and a sterile surgical field was created. The graft was accessed in the midportion, initially in an antegrade fashion with a micropuncture needle and micropuncture wire and sheath were placed. Imaging was performed which showed a patent HeRO graft from the midportion on.  There were a couple of small areas pseudoaneurysms that were not significantly sized.  The graft-to-graft otomy that had been created to use an immediate stick graft had minimal irregularity with less than 15% to 20% stenosis present. The silicone portion of the HeRO graft was patent and the central venous outflow was patent with the HeRO graft terminating just shy of the right atrium. I then  flipped the micropuncture sheath with the help of a micropuncture wire and exchanged out for a short Kumpe catheter which was parked at the arterial anastomosis which was found to be widely patent and the arterial limb of the graft was also widely patent.  There were no flow limitations in the graft. At this point, I elected to terminate the procedure. The sheath was removed around a 4-0 Monocryl pursestring suture. Pressure was held. Sterile dressing was placed. The patient tolerated the procedure well and was taken to the recovery room in stable condition.     ____________________________ Annice NeedyJason S. Cesia Orf, MD jsd:ce D: 07/02/2012 14:38:27 ET T: 07/02/2012 14:55:11 ET JOB#: [id number]  cc: Annice NeedyJason S. Yulissa Needham, MD, <Dictator> Annice NeedyJASON S Marsheila Alejo MD ELECTRONICALLY SIGNED 07/07/2012 9:28
